# Patient Record
Sex: Female | Born: 1940
Health system: Southern US, Community
[De-identification: ages and names within clinical notes are randomized; demographics above are authoritative.]

## PROBLEM LIST (undated history)

## (undated) DIAGNOSIS — M479 Spondylosis, unspecified: Secondary | ICD-10-CM

## (undated) DIAGNOSIS — R310 Gross hematuria: Secondary | ICD-10-CM

## (undated) DIAGNOSIS — K5909 Other constipation: Secondary | ICD-10-CM

## (undated) DIAGNOSIS — E039 Hypothyroidism, unspecified: Secondary | ICD-10-CM

## (undated) DIAGNOSIS — M797 Fibromyalgia: Secondary | ICD-10-CM

## (undated) DIAGNOSIS — K5730 Diverticulosis of large intestine without perforation or abscess without bleeding: Secondary | ICD-10-CM

## (undated) DIAGNOSIS — I10 Essential (primary) hypertension: Secondary | ICD-10-CM

## (undated) DIAGNOSIS — Z860100 Personal history of colon polyps, unspecified: Secondary | ICD-10-CM

## (undated) DIAGNOSIS — K449 Diaphragmatic hernia without obstruction or gangrene: Secondary | ICD-10-CM

## (undated) DIAGNOSIS — F419 Anxiety disorder, unspecified: Secondary | ICD-10-CM

## (undated) DIAGNOSIS — K589 Irritable bowel syndrome without diarrhea: Secondary | ICD-10-CM

## (undated) DIAGNOSIS — M1990 Unspecified osteoarthritis, unspecified site: Secondary | ICD-10-CM

## (undated) DIAGNOSIS — D649 Anemia, unspecified: Secondary | ICD-10-CM

## (undated) DIAGNOSIS — J449 Chronic obstructive pulmonary disease, unspecified: Secondary | ICD-10-CM

## (undated) DIAGNOSIS — Z8709 Personal history of other diseases of the respiratory system: Secondary | ICD-10-CM

## (undated) DIAGNOSIS — N2889 Other specified disorders of kidney and ureter: Secondary | ICD-10-CM

## (undated) DIAGNOSIS — K219 Gastro-esophageal reflux disease without esophagitis: Secondary | ICD-10-CM

## (undated) DIAGNOSIS — E785 Hyperlipidemia, unspecified: Secondary | ICD-10-CM

## (undated) HISTORY — PX: TUBAL LIGATION: SHX77

## (undated) HISTORY — PX: OTHER SURGICAL HISTORY: SHX169

## (undated) HISTORY — PX: BREAST REDUCTION SURGERY: SHX8

## (undated) HISTORY — PX: REPLACEMENT TOTAL KNEE: SUR1224

## (undated) HISTORY — DX: Anxiety disorder, unspecified: F41.9

## (undated) HISTORY — PX: TOTAL SHOULDER REPLACEMENT: SUR1217

## (undated) HISTORY — DX: Anemia, unspecified: D64.9

## (undated) HISTORY — PX: CHOLECYSTECTOMY: SHX55

---

## 2015-12-16 ENCOUNTER — Encounter: Payer: Self-pay | Admitting: Pediatric Intensive Care

## 2015-12-19 ENCOUNTER — Encounter: Payer: Self-pay | Admitting: Pediatric Intensive Care

## 2015-12-23 ENCOUNTER — Emergency Department (HOSPITAL_COMMUNITY)
Admission: EM | Admit: 2015-12-23 | Discharge: 2015-12-24 | Disposition: A | Discharge status: Home / Self Care | Payer: Medicare Other | Attending: Emergency Medicine | Admitting: Emergency Medicine

## 2015-12-23 ENCOUNTER — Emergency Department (HOSPITAL_COMMUNITY): Payer: Medicare Other

## 2015-12-23 ENCOUNTER — Encounter (HOSPITAL_COMMUNITY): Payer: Self-pay | Admitting: *Deleted

## 2015-12-23 DIAGNOSIS — J181 Lobar pneumonia, unspecified organism: Secondary | ICD-10-CM | POA: Diagnosis not present

## 2015-12-23 DIAGNOSIS — R1012 Left upper quadrant pain: Secondary | ICD-10-CM | POA: Diagnosis present

## 2015-12-23 DIAGNOSIS — J189 Pneumonia, unspecified organism: Secondary | ICD-10-CM

## 2015-12-23 HISTORY — DX: Diaphragmatic hernia without obstruction or gangrene: K44.9

## 2015-12-23 LAB — COMPREHENSIVE METABOLIC PANEL
ALBUMIN: 3.9 g/dL (ref 3.5–5.0)
ALT: 12 U/L — ABNORMAL LOW (ref 14–54)
AST: 21 U/L (ref 15–41)
Alkaline Phosphatase: 94 U/L (ref 38–126)
Anion gap: 7 (ref 5–15)
BUN: 16 mg/dL (ref 6–20)
CHLORIDE: 102 mmol/L (ref 101–111)
CO2: 29 mmol/L (ref 22–32)
Calcium: 8.9 mg/dL (ref 8.9–10.3)
Creatinine, Ser: 0.78 mg/dL (ref 0.44–1.00)
GFR calc Af Amer: >60 mL/min (ref >60)
GLUCOSE: 137 mg/dL — AB (ref 65–99)
POTASSIUM: 3.6 mmol/L (ref 3.5–5.1)
SODIUM: 138 mmol/L (ref 135–145)
Total Bilirubin: 0.4 mg/dL (ref 0.3–1.2)
Total Protein: 6.9 g/dL (ref 6.5–8.1)

## 2015-12-23 LAB — CBC WITH DIFFERENTIAL/PLATELET
Basophils Absolute: 0 10*3/uL (ref 0.0–0.1)
Basophils Relative: 0 %
EOS PCT: 3 %
Eosinophils Absolute: 0.3 10*3/uL (ref 0.0–0.7)
HCT: 39.8 % (ref 36.0–46.0)
Hemoglobin: 12.9 g/dL (ref 12.0–15.0)
LYMPHS ABS: 2 10*3/uL (ref 0.7–4.0)
LYMPHS PCT: 20 %
MCH: 27.3 pg (ref 26.0–34.0)
MCHC: 32.4 g/dL (ref 30.0–36.0)
MCV: 84.1 fL (ref 78.0–100.0)
MONOS PCT: 6 %
Monocytes Absolute: 0.7 10*3/uL (ref 0.1–1.0)
Neutro Abs: 7.1 10*3/uL (ref 1.7–7.7)
Neutrophils Relative %: 71 %
PLATELETS: 266 10*3/uL (ref 150–400)
RBC: 4.73 MIL/uL (ref 3.87–5.11)
RDW: 15 % (ref 11.5–15.5)
WBC: 10.1 10*3/uL (ref 4.0–10.5)

## 2015-12-23 MED ORDER — IOPAMIDOL (ISOVUE-300) INJECTION 61%
100.0000 mL | Freq: Once | INTRAVENOUS | Status: AC | PRN
  Administered 2015-12-23: 100 mL via INTRAVENOUS

## 2015-12-23 NOTE — ED Triage Notes (Signed)
Pt complains of upper left epigastric pain radiating into chest after eating today. Pt has hx of hiatal hernia. Pt states she usually takes nexium for relief but did not have any tonight. Pt states she had nausea vomiting after eating, denies nausea at this time. BP 162/92.

## 2015-12-23 NOTE — ED Provider Notes (Signed)
[facility] DEPT Provider Note   CSN: [id number] Arrival date & time: 12/23/15  2029     History   Chief Complaint Chief Complaint  Patient presents with  . Abdominal Pain    HPI Isabel Collins is a 75 y.o. female.  Patient presents with pain in the left upper and lower quadrant abdomen that started tonight. She reports nausea and vomiting that has resolved. She also states she has not had a bowel movement in a week. No fever, melena, hematemesis. She felt that symptoms were due to her hiatal hernia and having been out of her Nexium for a while. No SOB or cough but she does report greater LUQ discomfort with breathing deeply. No urinary symptoms.    The history is provided by the patient. No language interpreter was used.  Abdominal Pain   Associated symptoms include nausea and vomiting. Pertinent negatives include fever, diarrhea, constipation, dysuria and myalgias.    Past Medical History:  Diagnosis Date  . Hiatal hernia     There are no active problems to display for this patient.   History reviewed. No pertinent surgical history.  OB History    No data available       Home Medications    Prior to Admission medications   Not on File    Family History No family history on file.  Social History Social History  Substance Use Topics  . Smoking status: Never Smoker  . Smokeless tobacco: Never Used  . Alcohol use No     Allergies   Review of patient's allergies indicates not on file.   Review of Systems Review of Systems  Constitutional: Negative for chills and fever.  Respiratory: Negative.  Negative for shortness of breath.   Cardiovascular: Negative.  Negative for chest pain.  Gastrointestinal: Positive for abdominal pain, nausea and vomiting. Negative for constipation and diarrhea.  Genitourinary: Negative.  Negative for dysuria and vaginal discharge.  Musculoskeletal: Negative.  Negative for back pain and myalgias.  Skin: Negative.     Neurological: Negative.      Physical Exam Updated Vital Signs BP 144/59 (BP Location: Right Arm)   Pulse 96   Temp 98.6 F (37 C) (Oral)   Resp 18   Ht 5' (1.524 m)   Wt 65.8 kg   SpO2 96%   BMI 28.32 kg/m   Physical Exam  Constitutional: She is oriented to person, place, and time. She appears well-developed and well-nourished.  HENT:  Head: Normocephalic.  Neck: Normal range of motion. Neck supple.  Cardiovascular: Normal rate and regular rhythm.   No murmur heard. Pulmonary/Chest: Effort normal and breath sounds normal. She has no wheezes. She has no rales. She exhibits no tenderness.  Abdominal: Soft. Bowel sounds are normal. There is tenderness (Left upper and lower abdominal tenderness. ). There is no rebound and no guarding.  Musculoskeletal: Normal range of motion.  Neurological: She is alert and oriented to person, place, and time.  Skin: Skin is warm and dry. No rash noted.  Psychiatric: She has a normal mood and affect.     ED Treatments / Results  Labs (all labs ordered are listed, but only abnormal results are displayed) Labs Reviewed  URINE CULTURE  COMPREHENSIVE METABOLIC PANEL  CBC WITH DIFFERENTIAL/PLATELET  URINALYSIS, ROUTINE W REFLEX MICROSCOPIC (NOT AT [facility])   Results for orders placed or performed during the hospital encounter of 12/23/15  Comprehensive metabolic panel  Result Value Ref Range   Sodium 138 135 - 145 mmol/L  Potassium 3.6 3.5 - 5.1 mmol/L   Chloride 102 101 - 111 mmol/L   CO2 29 22 - 32 mmol/L   Glucose, Bld 137 (H) 65 - 99 mg/dL   BUN 16 6 - 20 mg/dL   Creatinine, Ser 0.78 0.44 - 1.00 mg/dL   Calcium 8.9 8.9 - 10.3 mg/dL   Total Protein 6.9 6.5 - 8.1 g/dL   Albumin 3.9 3.5 - 5.0 g/dL   AST 21 15 - 41 U/L   ALT 12 (L) 14 - 54 U/L   Alkaline Phosphatase 94 38 - 126 U/L   Total Bilirubin 0.4 0.3 - 1.2 mg/dL   GFR calc non Af Amer >60 >60 mL/min   GFR calc Af Amer >60 >60 mL/min   Anion gap 7 5 - 15  CBC with  Differential  Result Value Ref Range   WBC 10.1 4.0 - 10.5 K/uL   RBC 4.73 3.87 - 5.11 MIL/uL   Hemoglobin 12.9 12.0 - 15.0 g/dL   HCT 39.8 36.0 - 46.0 %   MCV 84.1 78.0 - 100.0 fL   MCH 27.3 26.0 - 34.0 pg   MCHC 32.4 30.0 - 36.0 g/dL   RDW 15.0 11.5 - 15.5 %   Platelets 266 150 - 400 K/uL   Neutrophils Relative % 71 %   Neutro Abs 7.1 1.7 - 7.7 K/uL   Lymphocytes Relative 20 %   Lymphs Abs 2.0 0.7 - 4.0 K/uL   Monocytes Relative 6 %   Monocytes Absolute 0.7 0.1 - 1.0 K/uL   Eosinophils Relative 3 %   Eosinophils Absolute 0.3 0.0 - 0.7 K/uL   Basophils Relative 0 %   Basophils Absolute 0.0 0.0 - 0.1 K/uL  Urinalysis, Routine w reflex microscopic  Result Value Ref Range   Color, Urine YELLOW YELLOW   APPearance CLEAR CLEAR   Specific Gravity, Urine 1.019 1.005 - 1.030   pH 7.5 5.0 - 8.0   Glucose, UA NEGATIVE NEGATIVE mg/dL   Hgb urine dipstick NEGATIVE NEGATIVE   Bilirubin Urine NEGATIVE NEGATIVE   Ketones, ur NEGATIVE NEGATIVE mg/dL   Protein, ur NEGATIVE NEGATIVE mg/dL   Nitrite NEGATIVE NEGATIVE   Leukocytes, UA NEGATIVE NEGATIVE    EKG  EKG Interpretation None       Radiology No results found. Ct Abdomen Pelvis W Contrast  Result Date: 12/24/2015 CLINICAL DATA:  Acute onset of left upper quadrant abdominal pain, radiating into the chest. Nausea and vomiting. Initial encounter. EXAM: CT ABDOMEN AND PELVIS WITH CONTRAST TECHNIQUE: Multidetector CT imaging of the abdomen and pelvis was performed using the standard protocol following bolus administration of intravenous contrast. CONTRAST:  175mL ISOVUE-300 IOPAMIDOL (ISOVUE-300) INJECTION 61% COMPARISON:  None. FINDINGS: Lower chest: Minimal left basilar opacity could reflect mild infection. A moderate to large hiatal hernia is noted. Hepatobiliary: The liver is unremarkable in appearance. The patient is status post cholecystectomy, with clips noted at the gallbladder fossa. The common bile duct remains normal in  caliber. Pancreas: The pancreas is within normal limits. Spleen: The spleen is unremarkable in appearance. Adrenals/Urinary Tract: The adrenal glands are unremarkable in appearance. The kidneys are within normal limits. There is no evidence of hydronephrosis. No renal or ureteral stones are identified. No perinephric stranding is seen. Stomach/Bowel: The stomach is unremarkable in appearance. The small bowel is within normal limits. The appendix is not visualized; there is no evidence for appendicitis. Scattered diverticulosis is noted along the descending and proximal sigmoid colon, without evidence of diverticulitis. Vascular/Lymphatic: Scattered  calcification is seen along the abdominal aorta and its branches. The abdominal aorta is otherwise grossly unremarkable. The inferior vena cava is grossly unremarkable. No retroperitoneal lymphadenopathy is seen. No pelvic sidewall lymphadenopathy is identified. Reproductive: The bladder is mildly distended and grossly unremarkable. The patient is status post hysterectomy. No suspicious adnexal masses are seen. Other: No additional soft tissue abnormalities are seen. Musculoskeletal: No acute osseous abnormalities are identified. Multilevel vacuum phenomenon is noted along the lower thoracic and lumbar spine. Underlying facet disease is noted. The visualized musculature is unremarkable in appearance. IMPRESSION: 1. Minimal left basilar airspace opacity could reflect mild infection. 2. Moderate to large hiatal hernia noted. 3. Scattered diverticulosis along the descending and proximal sigmoid colon, without evidence of diverticulitis. 4. Scattered aortic atherosclerosis. 5. Mild degenerative change along the lower thoracic and lumbar spine. Electronically Signed   By: Garald Balding M.D.   On: 12/24/2015 00:22    Procedures Procedures (including critical care time)  Medications Ordered in ED Medications - No data to display   Initial Impression / Assessment and  Plan / ED Course  I have reviewed the triage vital signs and the nursing notes.  Pertinent labs & imaging results that were available during my care of the patient were reviewed by me and considered in my medical decision making (see chart for details).  Clinical Course    Patient with left sided abdominal pain, no fever, vomiting. She denies SOB or cough, but reports LUQ discomfort is worse with breathing. CT of abdomen reveals left basilar opacity, ?mild infection. Given discomfort with breathing, will start on abx.   VSS. She remains afebrile. There is no ride back to the shelter tonight - patient will board in ED until buses run in am.   Final Clinical Impressions(s) / ED Diagnoses   Final diagnoses:  None   1. LLL PNA  New Prescriptions New Prescriptions   No medications on file     Charlann Lange, PA-C 12/24/15 0038    Leo Grosser, MD 12/24/15 [id number]

## 2015-12-23 NOTE — ED Notes (Signed)
Bed: WA21 Expected date:  Expected time:  Means of arrival:  Comments: EMS/abd pain after eating dinner/+hiatal hernia

## 2015-12-23 NOTE — Progress Notes (Signed)
Patient listed as having Medicare insurance without a pcp.  EDCM went to speak to patient at bedside.  Patient reports she has a pcp but not in [place].  Patient is from [facility].  Patient having IV placed at time of [facility] visit.

## 2015-12-23 NOTE — ED Notes (Signed)
IV ATTEMPT X2 UNSUCCESSFUL.

## 2015-12-24 DIAGNOSIS — J181 Lobar pneumonia, unspecified organism: Secondary | ICD-10-CM | POA: Diagnosis not present

## 2015-12-24 LAB — URINALYSIS, ROUTINE W REFLEX MICROSCOPIC
Bilirubin Urine: NEGATIVE
GLUCOSE, UA: NEGATIVE mg/dL
HGB URINE DIPSTICK: NEGATIVE
Ketones, ur: NEGATIVE mg/dL
Leukocytes, UA: NEGATIVE
Nitrite: NEGATIVE
PROTEIN: NEGATIVE mg/dL
SPECIFIC GRAVITY, URINE: 1.019 (ref 1.005–1.030)
pH: 7.5 (ref 5.0–8.0)

## 2015-12-24 MED ORDER — AZITHROMYCIN 250 MG PO TABS: 250.0000 mg | ORAL_TABLET | Freq: Every day | ORAL | 0 refills | Status: DC

## 2015-12-24 MED ORDER — AZITHROMYCIN 250 MG PO TABS
500.0000 mg | ORAL_TABLET | Freq: Once | ORAL | Status: AC
Start: 2015-12-24 — End: 2015-12-24
  Administered 2015-12-24: 500 mg via ORAL
  Filled 2015-12-24: qty 2

## 2015-12-25 LAB — URINE CULTURE: Culture: <10000 — AB

## 2015-12-26 MED FILL — AZITHROMYCIN 250 MG TABLET: 250 | 4 days supply | Qty: 4 | Fill #0

## 2016-01-03 ENCOUNTER — Encounter: Payer: Self-pay | Admitting: Pediatric Intensive Care

## 2016-01-06 ENCOUNTER — Encounter: Payer: Self-pay | Admitting: Pediatric Intensive Care

## 2016-01-09 NOTE — Congregational Nurse Program (Signed)
Congregational Nurse Program Note  Date of Encounter: 12/26/2015  Past Medical History: Past Medical History:  Diagnosis Date  . Hiatal hernia     Encounter Details:     CNP Questionnaire - 01/09/16 0854      Patient Demographics   Is this a new or existing patient? New   Patient is considered a/an Not Applicable   Race Caucasian/White     Patient Assistance   Location of Patient Assistance Not Applicable   Patient's financial/insurance status Low Income;Medicare   Uninsured Patient ([facility]) No   Patient referred to apply for the following financial assistance Not Applicable   Food insecurities addressed Not Applicable   Transportation assistance No   Assistance securing medications Yes   Type of Assistance [facility]   Educational health offerings Medications     Encounter Details   Primary purpose of visit Acute Illness/Condition Visit   Was an Emergency Department visit averted? Not Applicable   Does patient have a medical provider? No   Patient referred to Not Applicable   Was a mental health screening completed? (GAINS tool) No   Does patient have dental issues? No   Does patient have vision issues? No   Does your patient have an abnormal blood pressure today? No   Since previous encounter, have you referred patient for abnormal blood pressure that resulted in a new diagnosis or medication change? No   Does your patient have an abnormal blood glucose today? No   Since previous encounter, have you referred patient for abnormal blood glucose that resulted in a new diagnosis or medication change? No   Was there a life-saving intervention made? No     Client was seen in [facility] ED dx with pneumonia.  Was hospitalized at [facility].  Is currently homeless, living at the [facility].  Is being seen by congregational nurse Talbert Cage.  Client in need of script being filled after [facility] visit.  Script for Zithromax filled and  delivered to client

## 2016-01-11 NOTE — Congregational Nurse Program (Signed)
Congregational Nurse Program Note  Date of Encounter: 01/03/2016  Past Medical History: Past Medical History:  Diagnosis Date  . Hiatal hernia     Encounter Details:     CNP Questionnaire - 01/09/16 0854      Patient Demographics   Is this a new or existing patient? New   Patient is considered a/an Not Applicable   Race Caucasian/White     Patient Assistance   Location of Patient Assistance Not Applicable   Patient's financial/insurance status Low Income;Medicare   Uninsured Patient ([facility]) No   Patient referred to apply for the following financial assistance Not Applicable   Food insecurities addressed Not Applicable   Transportation assistance No   Assistance securing medications Yes   Type of Assistance [facility]   Educational health offerings Medications     Encounter Details   Primary purpose of visit Acute Illness/Condition Visit   Was an Emergency Department visit averted? Not Applicable   Does patient have a medical provider? No   Patient referred to Not Applicable   Was a mental health screening completed? (GAINS tool) No   Does patient have dental issues? No   Does patient have vision issues? No   Does your patient have an abnormal blood pressure today? No   Since previous encounter, have you referred patient for abnormal blood pressure that resulted in a new diagnosis or medication change? No   Does your patient have an abnormal blood glucose today? No   Since previous encounter, have you referred patient for abnormal blood glucose that resulted in a new diagnosis or medication change? No   Was there a life-saving intervention made? No     Client states that she had gone to the ED due to breathing issues. Assisted by C. Evans CN to get prescribed antibiotic. Client states that she is feeling better. BBS clear. Client has intermittent non-productive cough. Client will follow up with CN as needed.

## 2016-01-11 NOTE — Congregational Nurse Program (Signed)
Congregational Nurse Program Note  Date of Encounter: 12/16/2015  Past Medical History: Past Medical History:  Diagnosis Date  . Hiatal hernia     Encounter Details:     CNP Questionnaire - 01/09/16 0854      Patient Demographics   Is this a new or existing patient? New   Patient is considered a/an Not Applicable   Race Caucasian/White     Patient Assistance   Location of Patient Assistance Not Applicable   Patient's financial/insurance status Low Income;Medicare   Uninsured Patient ([facility]) No   Patient referred to apply for the following financial assistance Not Applicable   Food insecurities addressed Not Applicable   Transportation assistance No   Assistance securing medications Yes   Type of Assistance [facility]   Educational health offerings Medications     Encounter Details   Primary purpose of visit Acute Illness/Condition Visit   Was an Emergency Department visit averted? Not Applicable   Does patient have a medical provider? No   Patient referred to Not Applicable   Was a mental health screening completed? (GAINS tool) No   Does patient have dental issues? No   Does patient have vision issues? No   Does your patient have an abnormal blood pressure today? No   Since previous encounter, have you referred patient for abnormal blood pressure that resulted in a new diagnosis or medication change? No   Does your patient have an abnormal blood glucose today? No   Since previous encounter, have you referred patient for abnormal blood glucose that resulted in a new diagnosis or medication change? No   Was there a life-saving intervention made? No     New Cn client visit. Client is new to shelter. States history of "high blood pressure, irregular heart beat, arthritis, fibromyalgia". Client states that she had been in the hospital in [facility] due to "pneumonia". Client is on multiple medications. CN will assist client with medications as  needed and will assist with connecting to PCP- probably [facility] at [facility]. Client to follow up in clinic tomorrow for medication list and BP re-check.

## 2016-01-11 NOTE — Congregational Nurse Program (Signed)
Congregational Nurse Program Note  Date of Encounter: 12/19/2015  Past Medical History: Past Medical History:  Diagnosis Date  . Hiatal hernia     Encounter Details:     CNP Questionnaire - 01/09/16 0854      Patient Demographics   Is this a new or existing patient? New   Patient is considered a/an Not Applicable   Race Caucasian/White     Patient Assistance   Location of Patient Assistance Not Applicable   Patient's financial/insurance status Low Income;Medicare   Uninsured Patient ([facility]) No   Patient referred to apply for the following financial assistance Not Applicable   Food insecurities addressed Not Applicable   Transportation assistance No   Assistance securing medications Yes   Type of Assistance [facility]   Educational health offerings Medications     Encounter Details   Primary purpose of visit Acute Illness/Condition Visit   Was an Emergency Department visit averted? Not Applicable   Does patient have a medical provider? No   Patient referred to Not Applicable   Was a mental health screening completed? (GAINS tool) No   Does patient have dental issues? No   Does patient have vision issues? No   Does your patient have an abnormal blood pressure today? No   Since previous encounter, have you referred patient for abnormal blood pressure that resulted in a new diagnosis or medication change? No   Does your patient have an abnormal blood glucose today? No   Since previous encounter, have you referred patient for abnormal blood glucose that resulted in a new diagnosis or medication change? No   Was there a life-saving intervention made? No     CN reviewed medications with client. Client has adequate amount of medication for now but will need to establish local PCP and transfer insurance. Client states that she has a PCP in [place] who she visited in [place] will go to walk in clinic at [facility] on 10/17.

## 2016-01-12 NOTE — Congregational Nurse Program (Signed)
Congregational Nurse Program Note  Date of Encounter: 01/03/2016  Past Medical History: Past Medical History:  Diagnosis Date  . Hiatal hernia     Encounter Details:     CNP Questionnaire - 01/09/16 0854      Patient Demographics   Is this a new or existing patient? New   Patient is considered a/an Not Applicable   Race Caucasian/White     Patient Assistance   Location of Patient Assistance Not Applicable   Patient's financial/insurance status Low Income;Medicare   Uninsured Patient ([facility]) No   Patient referred to apply for the following financial assistance Not Applicable   Food insecurities addressed Not Applicable   Transportation assistance No   Assistance securing medications Yes   Type of Assistance [facility]   Educational health offerings Medications     Encounter Details   Primary purpose of visit Acute Illness/Condition Visit   Was an Emergency Department visit averted? Not Applicable   Does patient have a medical provider? No   Patient referred to Not Applicable   Was a mental health screening completed? (GAINS tool) No   Does patient have dental issues? No   Does patient have vision issues? No   Does your patient have an abnormal blood pressure today? No   Since previous encounter, have you referred patient for abnormal blood pressure that resulted in a new diagnosis or medication change? No   Does your patient have an abnormal blood glucose today? No   Since previous encounter, have you referred patient for abnormal blood glucose that resulted in a new diagnosis or medication change? No   Was there a life-saving intervention made? No     Client in for follow up post-ED visit. She is nearly out of synthroid, omeprazole and lyrica. CN contacted physician of record in [facility] and they called in prescriptions to [facility]. Unable to fill lyrica due to cost as client's medicaid rx is not completed. Will still need a pre-authorization  for lyrica after medicaid is completed. Client encouraged to take appointment at [facility] clinic as she needs to be seen for follow up, but she refuses to ride bus. CN will revisit this issue with [facility].

## 2016-01-13 ENCOUNTER — Encounter: Payer: Self-pay | Admitting: Pediatric Intensive Care

## 2016-01-16 ENCOUNTER — Encounter: Payer: Self-pay | Admitting: Pediatric Intensive Care

## 2016-01-16 NOTE — Congregational Nurse Program (Signed)
Congregational Nurse Program Note  Date of Encounter: 01/13/2016  Past Medical History: Past Medical History:  Diagnosis Date  . Hiatal hernia     Encounter Details:     CNP Questionnaire - 01/13/16 1100      Patient Demographics   Is this a new or existing patient? Existing   Patient is considered a/an Not Applicable   Race Caucasian/White     Patient Assistance   Location of Patient Assistance [facility]   Patient's financial/insurance status Medicare   Uninsured Patient ([facility]) No   Patient referred to apply for the following financial assistance Not Applicable   Food insecurities addressed Not Applicable   Transportation assistance No   Assistance securing medications Yes   Type of Assistance Other   Educational health offerings Medications     Encounter Details   Primary purpose of visit [place]   Was an Emergency Department visit averted? Not Applicable   Does patient have a medical provider? No   Patient referred to Not Applicable   Was a mental health screening completed? (GAINS tool) No   Does patient have dental issues? No   Does patient have vision issues? No   Does your patient have an abnormal blood pressure today? No   Since previous encounter, have you referred patient for abnormal blood pressure that resulted in a new diagnosis or medication change? No   Does your patient have an abnormal blood glucose today? No   Since previous encounter, have you referred patient for abnormal blood glucose that resulted in a new diagnosis or medication change? No   Was there a life-saving intervention made? No     Client is for assistance with obtaining medications. Client states that she had appointment at [facility] and that the provider said she would [facility] on [address] [facility]- no medications were on record. Pharmacist called client's provider in [facility] and prescriptions for  Lyrica and Cymbalta will be transferred to [facility]. Pharmacists will notify CN when this has occurred. CN will notify client.

## 2016-01-16 NOTE — Congregational Nurse Program (Signed)
Congregational Nurse Program Note  Date of Encounter: 01/16/2016  Past Medical History: Past Medical History:  Diagnosis Date  . Hiatal hernia     Encounter Details:     CNP Questionnaire - 01/16/16 0930      Patient Demographics   Is this a new or existing patient? Existing   Patient is considered a/an Not Applicable   Race Caucasian/White     Patient Assistance   Location of Patient Assistance [facility]   Patient's financial/insurance status Medicare   Uninsured Patient ([facility]) No   Patient referred to apply for the following financial assistance Not Applicable   Food insecurities addressed Not Applicable   Transportation assistance No   Assistance securing medications No   Type of Assistance Other   Educational health offerings Medications     Encounter Details   Primary purpose of visit Other;Safety   Was an Emergency Department visit averted? Not Applicable   Does patient have a medical provider? No   Patient referred to Not Applicable   Was a mental health screening completed? (GAINS tool) No   Does patient have dental issues? No   Does patient have vision issues? No   Does your patient have an abnormal blood pressure today? No   Since previous encounter, have you referred patient for abnormal blood pressure that resulted in a new diagnosis or medication change? No   Does your patient have an abnormal blood glucose today? No   Since previous encounter, have you referred patient for abnormal blood glucose that resulted in a new diagnosis or medication change? No   Was there a life-saving intervention made? No      CN remindedr client that her [facility]. Client had marked bottle of Trazadone as Tylenol. Reviewed medication safety and labelling with client.
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
## 2016-01-24 ENCOUNTER — Encounter: Payer: Self-pay | Admitting: Pediatric Intensive Care

## 2016-01-31 ENCOUNTER — Ambulatory Visit (HOSPITAL_COMMUNITY)
Admission: EM | Admit: 2016-01-31 | Discharge: 2016-01-31 | Disposition: A | Discharge status: Home / Self Care | Payer: Medicare Other | Attending: Family Medicine | Admitting: Family Medicine

## 2016-01-31 ENCOUNTER — Encounter (HOSPITAL_COMMUNITY): Payer: Self-pay | Admitting: Emergency Medicine

## 2016-01-31 DIAGNOSIS — K5904 Chronic idiopathic constipation: Secondary | ICD-10-CM

## 2016-01-31 HISTORY — DX: Essential (primary) hypertension: I10

## 2016-01-31 MED ORDER — METHYLCELLULOSE (LAXATIVE) PO POWD: 1.0000 | Freq: Every day | ORAL | 2 refills | Status: DC

## 2016-01-31 MED ORDER — POLYETHYLENE GLYCOL 3350 17 GM/SCOOP PO POWD: 17.0000 g | Freq: Every day | ORAL | 5 refills | Status: DC

## 2016-01-31 NOTE — ED Provider Notes (Signed)
[facility]    CSN: [id number] Arrival date & time: 01/31/16  1207     History   Chief Complaint Chief Complaint  Patient presents with  . Constipation    HPI Isabel Collins is a 75 y.o. female.   The history is provided by the patient.  Constipation  Severity:  Moderate Time since last bowel movement:  1 day Progression:  Unchanged Chronicity:  Chronic (lifelong problem, has been on dulcolax which causes a blow out. no blood.) Context: dehydration, dietary changes and stress   Stool description:  Loose Relieved by:  None tried Worsened by:  Nothing Associated symptoms: no abdominal pain, no fever, no nausea and no vomiting     Past Medical History:  Diagnosis Date  . Hiatal hernia   . High cholesterol   . Hypertension   . Thyroid disease     There are no active problems to display for this patient.   Past Surgical History:  Procedure Laterality Date  . arm surgery    . BREAST SURGERY    . REPLACEMENT TOTAL KNEE    . TOTAL SHOULDER REPLACEMENT      OB History    No data available       Home Medications    Prior to Admission medications   Medication Sig Start Date End Date Taking? Authorizing Provider  esomeprazole (NEXIUM) 40 MG capsule Take 40 mg by mouth daily at 12 noon.   Yes Historical Provider, MD  atorvastatin (LIPITOR) 40 MG tablet Take 40 mg by mouth at bedtime.    Historical Provider, MD  azithromycin (ZITHROMAX Z-PAK) 250 MG tablet Take 1 tablet (250 mg total) by mouth daily. 12/24/15   Charlann Lange, PA-C  celecoxib (CELEBREX) 200 MG capsule Take 200 mg by mouth 2 (two) times daily.    Historical Provider, MD  dicyclomine (BENTYL) 10 MG capsule Take 20 mg by mouth 3 (three) times daily as needed for spasms.    Historical Provider, MD  DULoxetine (CYMBALTA) 60 MG capsule Take 60 mg by mouth every morning.    Historical Provider, MD  guaifenesin (HUMIBID E) 400 MG TABS tablet Take 400 mg by mouth every 4 (four) hours as needed  (mucus relief).    Historical Provider, MD  levothyroxine (SYNTHROID, LEVOTHROID) 50 MCG tablet Take 50 mcg by mouth daily before breakfast.    Historical Provider, MD  metoprolol succinate (TOPROL-XL) 100 MG 24 hr tablet Take 100 mg by mouth every morning. Take with or immediately following a meal.    Historical Provider, MD  omeprazole (PRILOSEC) 40 MG capsule Take 40 mg by mouth every morning.    Historical Provider, MD  pregabalin (LYRICA) 50 MG capsule Take 50 mg by mouth 2 (two) times daily.    Historical Provider, MD  traZODone (DESYREL) 50 MG tablet Take 50 mg by mouth at bedtime as needed for sleep.    Historical Provider, MD    Family History No family history on file.  Social History Social History  Substance Use Topics  . Smoking status: Former Research scientist (life sciences)  . Smokeless tobacco: Never Used  . Alcohol use No     Allergies   Patient has no known allergies.   Review of Systems Review of Systems  Constitutional: Negative for fever.  Gastrointestinal: Positive for constipation. Negative for abdominal pain, nausea and vomiting.     Physical Exam Triage Vital Signs ED Triage Vitals [01/31/16 1312]  Enc Vitals Group     BP 157/70  Pulse Rate 79     Resp 18     Temp 97.6 F (36.4 C)     Temp Source Oral     SpO2 96 %     Weight      Height      Head Circumference      Peak Flow      Pain Score      Pain Loc      Pain Edu?      Excl. in [place]?    No data found.   Updated Vital Signs BP 157/70 (BP Location: Left Arm)   Pulse 79   Temp 97.6 F (36.4 C) (Oral)   Resp 18   SpO2 96%   Visual Acuity Right Eye Distance:   Left Eye Distance:   Bilateral Distance:    Right Eye Near:   Left Eye Near:    Bilateral Near:     Physical Exam  Constitutional: She appears well-developed and well-nourished. No distress.  Abdominal: Soft. Bowel sounds are normal. She exhibits no mass. There is no tenderness. There is no rebound. No hernia.  Genitourinary: Rectal  exam shows guaiac negative stool.  Genitourinary Comments: Rectal exam liquid stool no impaction, guiac neg.  Nursing note and vitals reviewed.    UC Treatments / Results  Labs (all labs ordered are listed, but only abnormal results are displayed) Labs Reviewed - No data to display  EKG  EKG Interpretation None       Radiology No results found.  Procedures Procedures (including critical care time)  Medications Ordered in UC Medications - No data to display   Initial Impression / Assessment and Plan / UC Course  I have reviewed the triage vital signs and the nursing notes.  Pertinent labs & imaging results that were available during my care of the patient were reviewed by me and considered in my medical decision making (see chart for details).  Clinical Course       Final Clinical Impressions(s) / UC Diagnoses   Final diagnoses:  None    New Prescriptions New Prescriptions   No medications on file     Billy Fischer, MD 01/31/16 1416

## 2016-01-31 NOTE — Discharge Instructions (Signed)
See your doctor if further problems. °

## 2016-01-31 NOTE — ED Triage Notes (Signed)
Patient had a normal bm today, this morning.  Patient reports having no bowel movement for 3 weeks.  Patient reports not taking any laxatives today.  Patient has a long history of bowel irregularity.  Reports a normal bm, but clear liquid noted on underwear-thinks this is from rectum.

## 2016-02-03 ENCOUNTER — Encounter: Payer: Self-pay | Admitting: Pediatric Intensive Care

## 2016-02-06 ENCOUNTER — Encounter: Payer: Self-pay | Admitting: Pediatric Intensive Care

## 2016-02-07 ENCOUNTER — Encounter: Payer: Self-pay | Admitting: Pediatric Intensive Care

## 2016-02-08 NOTE — Congregational Nurse Program (Signed)
Congregational Nurse Program Note  Date of Encounter: 01/06/2016  Past Medical History: Past Medical History:  Diagnosis Date  . Hiatal hernia   . High cholesterol   . Hypertension   . Thyroid disease     Encounter Details:     CNP Questionnaire - 01/16/16 1224      Patient Demographics   Is this a new or existing patient? Existing   Patient is considered a/an Not Applicable   Race Caucasian/White     Patient Assistance   Location of Patient Assistance [facility]   Patient's financial/insurance status Medicare   Uninsured Patient ([facility]/Care Connects) No   Patient referred to apply for the following financial assistance Not Applicable   Food insecurities addressed Not Applicable   Transportation assistance No   Assistance securing medications No   Educational Naval architect health;Other     Encounter Details   Primary purpose of visit Other;Safety   Was an Emergency Department visit averted? Not Applicable   Does patient have a medical provider? No   Patient referred to Not Applicable   Was a mental health screening completed? (GAINS tool) No   Does patient have dental issues? No   Does patient have vision issues? No   Does your patient have an abnormal blood pressure today? No   Since previous encounter, have you referred patient for abnormal blood pressure that resulted in a new diagnosis or medication change? No   Does your patient have an abnormal blood glucose today? No   Since previous encounter, have you referred patient for abnormal blood glucose that resulted in a new diagnosis or medication change? No   Was there a life-saving intervention made? No    Client in for BP check. She has not taken her medications yet today. CN verified that cleitn has an adequate supply of medication. CN revisted need for PCP in area but client states that she won't take bus for transportation. Client will return to CN clinic as needed.

## 2016-02-09 NOTE — Congregational Nurse Program (Signed)
Congregational Nurse Program Note  Date of Encounter: 01/24/2016  Past Medical History: Past Medical History:  Diagnosis Date  . Hiatal hernia   . High cholesterol   . Hypertension   . Thyroid disease     Encounter Details:     CNP Questionnaire - 01/24/16 1000      Patient Demographics   Is this a new or existing patient? Existing   Patient is considered a/an Not Applicable   Race Caucasian/White     Patient Assistance   Location of Patient Assistance [facility]   Patient's financial/insurance status Medicare;Medicaid   Patient referred to apply for the following financial assistance Not Applicable   Food insecurities addressed Not Applicable   Transportation assistance No   Assistance securing medications Yes   Type of Assistance Other   Educational health offerings Chronic disease     Encounter Details   Primary purpose of visit Chronic Illness/Condition Visit;Spiritual Care/Support Visit   Was an Emergency Department visit averted? Not Applicable   Does patient have a medical provider? No   Was a mental health screening completed? (GAINS tool) No   Does patient have dental issues? No   Does patient have vision issues? No   Does your patient have an abnormal blood pressure today? No   Since previous encounter, have you referred patient for abnormal blood pressure that resulted in a new diagnosis or medication change? No   Does your patient have an abnormal blood glucose today? No   Since previous encounter, have you referred patient for abnormal blood glucose that resulted in a new diagnosis or medication change? No   Was there a life-saving intervention made? No     BP check. Client states that she is ready to find a PCP in the area. Coordinating with Joy, Tourist information centre manager, as client will not ride the bus. Prescriptions transferred to [facility] on [address] for easier client access. Client will pick up refills today via taxi.

## 2016-02-10 ENCOUNTER — Ambulatory Visit (INDEPENDENT_AMBULATORY_CARE_PROVIDER_SITE_OTHER): Payer: Medicare Other

## 2016-02-10 ENCOUNTER — Ambulatory Visit (INDEPENDENT_AMBULATORY_CARE_PROVIDER_SITE_OTHER): Payer: Medicare Other | Admitting: Family Medicine

## 2016-02-10 VITALS — BP 134/72 | HR 73 | Temp 97.7°F | Resp 16 | Ht 61.0 in | Wt 152.0 lb

## 2016-02-10 DIAGNOSIS — R059 Cough, unspecified: Secondary | ICD-10-CM

## 2016-02-10 DIAGNOSIS — R05 Cough: Secondary | ICD-10-CM

## 2016-02-10 DIAGNOSIS — E559 Vitamin D deficiency, unspecified: Secondary | ICD-10-CM | POA: Diagnosis not present

## 2016-02-10 DIAGNOSIS — K449 Diaphragmatic hernia without obstruction or gangrene: Secondary | ICD-10-CM | POA: Diagnosis not present

## 2016-02-10 DIAGNOSIS — E7800 Pure hypercholesterolemia, unspecified: Secondary | ICD-10-CM

## 2016-02-10 DIAGNOSIS — E039 Hypothyroidism, unspecified: Secondary | ICD-10-CM

## 2016-02-10 DIAGNOSIS — I10 Essential (primary) hypertension: Secondary | ICD-10-CM

## 2016-02-10 LAB — POCT CBC
Granulocyte percent: 59.5 %G (ref 37–80)
HEMATOCRIT: 40.4 % (ref 37.7–47.9)
HEMOGLOBIN: 13.9 g/dL (ref 12.2–16.2)
LYMPH, POC: 2.7 (ref 0.6–3.4)
MCH, POC: 27.6 pg (ref 27–31.2)
MCHC: 34.4 g/dL (ref 31.8–35.4)
MCV: 80.2 fL (ref 80–97)
MID (CBC): 0.6 (ref 0–0.9)
MPV: 7.6 fL (ref 0–99.8)
POC GRANULOCYTE: 4.8 (ref 2–6.9)
POC LYMPH PERCENT: 32.8 %L (ref 10–50)
POC MID %: 7.7 % (ref 0–12)
Platelet Count, POC: 259 10*3/uL (ref 142–424)
RBC: 5.03 M/uL (ref 4.04–5.48)
RDW, POC: 14.5 %
WBC: 8.1 10*3/uL (ref 4.6–10.2)

## 2016-02-10 MED ORDER — THERA VITAL M PO TABS: 1.0000 | ORAL_TABLET | Freq: Every day | ORAL | 3 refills | Status: DC

## 2016-02-10 MED ORDER — PROMETHAZINE-PHENYLEPHRINE 6.25-5 MG/5ML PO SYRP: 5.0000 mL | ORAL_SOLUTION | ORAL | 0 refills | Status: DC | PRN

## 2016-02-10 MED ORDER — FLUTICASONE PROPIONATE 50 MCG/ACT NA SUSP: 2.0000 | Freq: Every day | NASAL | 1 refills | Status: DC

## 2016-02-10 MED ORDER — ERGOCALCIFEROL 1.25 MG (50000 UT) PO CAPS: 50000.0000 [IU] | ORAL_CAPSULE | ORAL | 0 refills | Status: DC

## 2016-02-10 NOTE — Progress Notes (Addendum)
Subjective:  By signing my name below, I, Essence Howell, attest that this documentation has been prepared under the direction and in the presence of [name], MD Electronically Signed: Ladene Artist, ED Scribe 02/10/2016 at 4:19 PM.   Patient ID: Isabel Collins, female    DOB: [birth year], 75 y.o.   MRN: [id number] Chief Complaint  Patient presents with  . Cough    especially at night  . Sore Throat   HPI HPI Comments: Isabel Collins is a 75 y.o. female, with a h/o fibromyalgia, IBS, constipation and hiatal hernia, who presents to the [facility] complaining of URI symptoms. [facility] at the homeless shelter but the congregational nurse at the [facility] convinced her to come in today. Pt has a PCP in [place] who prescribes her medication but pt does not get to visit her PCP often. She has not seen her PCP in a while. Pt gets frequent respiratory infections and has had multiple hospitalizations for pneumonia but no underlying respiratory disease. She has psychiatric disease with unknown diagnosis and unwillingness to treat.   Pt reports ongoing cough for the past month that is exacerbated at night. She states that cough recently became productive with yellow colored phlegm. Pt reports associated sore throat that is exacerbated with swallowing and fatigue that she describes as feeling "sluggish". She denies fever. Pt was hospitalized for similar symptoms on 10/20 for 1 week, diagnosed with pneumonia and treated with azithromycin.  Past Medical History:  Diagnosis Date  . Hiatal hernia   . High cholesterol   . Hypertension   . Thyroid disease    Current Outpatient Prescriptions on File Prior to Visit  Medication Sig Dispense Refill  . atorvastatin (LIPITOR) 40 MG tablet Take 40 mg by mouth at bedtime.    Marland Kitchen azithromycin (ZITHROMAX Z-PAK) 250 MG tablet Take 1 tablet (250 mg total) by mouth daily. 4 tablet 0  . celecoxib (CELEBREX) 200 MG capsule Take 200 mg by mouth 2  (two) times daily.    Marland Kitchen dicyclomine (BENTYL) 10 MG capsule Take 20 mg by mouth 3 (three) times daily as needed for spasms.    . DULoxetine (CYMBALTA) 60 MG capsule Take 60 mg by mouth every morning.    Marland Kitchen esomeprazole (NEXIUM) 40 MG capsule Take 40 mg by mouth daily at 12 noon.    Marland Kitchen guaifenesin (HUMIBID E) 400 MG TABS tablet Take 400 mg by mouth every 4 (four) hours as needed (mucus relief).    Marland Kitchen levothyroxine (SYNTHROID, LEVOTHROID) 50 MCG tablet Take 50 mcg by mouth daily before breakfast.    . methylcellulose (CITRUCEL) oral powder Take 1 packet by mouth daily. 1418 tablet 2  . metoprolol succinate (TOPROL-XL) 100 MG 24 hr tablet Take 100 mg by mouth every morning. Take with or immediately following a meal.    . polyethylene glycol powder (MIRALAX) powder Take 17 g by mouth daily. 225 g 5  . pregabalin (LYRICA) 50 MG capsule Take 50 mg by mouth 2 (two) times daily.    . traZODone (DESYREL) 50 MG tablet Take 50 mg by mouth at bedtime as needed for sleep.    Marland Kitchen omeprazole (PRILOSEC) 40 MG capsule Take 40 mg by mouth every morning.     No current facility-administered medications on file prior to visit.    No Known Allergies  Depression screen PHQ 2/9 02/10/2016  Decreased Interest 0  Down, Depressed, Hopeless 0  PHQ - 2 Score 0    Review of  Systems  Constitutional: Positive for fatigue. Negative for activity change, appetite change, chills, diaphoresis and fever.  HENT: Positive for postnasal drip and sore throat. Negative for congestion, rhinorrhea, sinus pain and trouble swallowing.   Respiratory: Positive for cough. Negative for chest tightness, shortness of breath and wheezing.   Cardiovascular: Negative for chest pain, palpitations and leg swelling.  Gastrointestinal: Negative for abdominal distention, abdominal pain, constipation, diarrhea, nausea and vomiting.  Allergic/Immunologic: Positive for environmental allergies.  Psychiatric/Behavioral: Positive for confusion. Negative for  agitation and dysphoric mood. The patient is not nervous/anxious.       Objective:   Physical Exam  Constitutional: She is oriented to person, place, and time. She appears well-developed and well-nourished. No distress.  HENT:  Head: Normocephalic and atraumatic.  Right Ear: Tympanic membrane is injected. A middle ear effusion is present.  Left Ear: Tympanic membrane is injected. A middle ear effusion is present.  Nose: Rhinorrhea (purulent) present.  Mouth/Throat: Oropharynx is clear and moist.  L TM: scarring Nares: erythematous  Eyes: Conjunctivae and EOM are normal.  Neck: Neck supple. No tracheal deviation present. No thyromegaly present.  Fullness above the proximal clavicle bilaterally  Cardiovascular: Normal rate.   Pulmonary/Chest: Effort normal. No respiratory distress. She has rales (Fine throughout).  Good air movement  Musculoskeletal: Normal range of motion.  Lymphadenopathy:       Head (right side): Tonsillar adenopathy present.       Head (left side): Tonsillar adenopathy present.    She has cervical adenopathy.       Right: No supraclavicular adenopathy present.       Left: No supraclavicular adenopathy present.  Small firm lymph nodes in the R upper anterior cervical  Neurological: She is alert and oriented to person, place, and time.  Skin: Skin is warm and dry.  Psychiatric: She has a normal mood and affect. Her behavior is normal.  Nursing note and vitals reviewed.  BP 134/72 (BP Location: Right Arm, Patient Position: Sitting, Cuff Size: Normal)   Pulse 73   Temp 97.7 F (36.5 C)   Resp 16   Ht 5\' 1"  (1.549 m)   Wt 152 lb (68.9 kg)   SpO2 97%   BMI 28.72 kg/m     Results for orders placed or performed in visit on 02/10/16  POCT CBC  Result Value Ref Range   WBC 8.1 4.6 - 10.2 K/uL   Lymph, poc 2.7 0.6 - 3.4   POC LYMPH PERCENT 32.8 10 - 50 %L   MID (cbc) 0.6 0 - 0.9   POC MID % 7.7 0 - 12 %M   POC Granulocyte 4.8 2 - 6.9   Granulocyte percent  59.5 37 - 80 %G   RBC 5.03 4.04 - 5.48 M/uL   Hemoglobin 13.9 12.2 - 16.2 g/dL   HCT, POC 40.4 37.7 - 47.9 %   MCV 80.2 80 - 97 fL   MCH, POC 27.6 27 - 31.2 pg   MCHC 34.4 31.8 - 35.4 g/dL   RDW, POC 14.5 %   Platelet Count, POC 259 142 - 424 K/uL   MPV 7.6 0 - 99.8 fL   Results for orders placed or performed in visit on 02/10/16  Sedimentation Rate  Result Value Ref Range   Sed Rate 4 0 - 40 mm/hr  TSH  Result Value Ref Range   TSH 5.760 (H) 0.450 - 4.500 uIU/mL  Comprehensive metabolic panel  Result Value Ref Range   Glucose 90 65 -  99 mg/dL   BUN 13 8 - 27 mg/dL   Creatinine, Ser 0.69 0.57 - 1.00 mg/dL   GFR calc non Af Amer 85 >59 mL/min/1.73   GFR calc Af Amer 98 >59 mL/min/1.73   BUN/Creatinine Ratio 19 12 - 28   Sodium 140 134 - 144 mmol/L   Potassium 4.5 3.5 - 5.2 mmol/L   Chloride 96 96 - 106 mmol/L   CO2 30 (H) 18 - 29 mmol/L   Calcium 9.3 8.7 - 10.3 mg/dL   Total Protein 7.4 6.0 - 8.5 g/dL   Albumin 4.8 3.5 - 4.8 g/dL   Globulin, Total 2.6 1.5 - 4.5 g/dL   Albumin/Globulin Ratio 1.8 1.2 - 2.2   Bilirubin Total 0.3 0.0 - 1.2 mg/dL   Alkaline Phosphatase 141 (H) 39 - 117 IU/L   AST 17 0 - 40 IU/L   ALT 8 0 - 32 IU/L  VITAMIN D 25 Hydroxy (Vit-D Deficiency, Fractures)  Result Value Ref Range   Vit D, 25-Hydroxy 24.7 (L) 30.0 - 100.0 ng/mL  POCT CBC  Result Value Ref Range   WBC 8.1 4.6 - 10.2 K/uL   Lymph, poc 2.7 0.6 - 3.4   POC LYMPH PERCENT 32.8 10 - 50 %L   MID (cbc) 0.6 0 - 0.9   POC MID % 7.7 0 - 12 %M   POC Granulocyte 4.8 2 - 6.9   Granulocyte percent 59.5 37 - 80 %G   RBC 5.03 4.04 - 5.48 M/uL   Hemoglobin 13.9 12.2 - 16.2 g/dL   HCT, POC 40.4 37.7 - 47.9 %   MCV 80.2 80 - 97 fL   MCH, POC 27.6 27 - 31.2 pg   MCHC 34.4 31.8 - 35.4 g/dL   RDW, POC 14.5 %   Platelet Count, POC 259 142 - 424 K/uL   MPV 7.6 0 - 99.8 fL   Dg Chest 2 View  Result Date: 02/10/2016 CLINICAL DATA:  Productive cough and pneumonia, 6 weeks duration. EXAM: CHEST  2  VIEW COMPARISON:  None. FINDINGS: Heart size is normal. There is atherosclerosis of the aorta. There is a moderate size hiatal hernia. Calcified granuloma in the right mid lung laterally. No infiltrate, collapse or effusion. No acute bone finding. Previous shoulder replacement on the right. Mild spinal curvature. IMPRESSION: No active cardiopulmonary disease. Moderate size hiatal hernia. Right lung granuloma. Electronically Signed   By: Nelson Chimes M.D.   On: 02/10/2016 16:58    Assessment & Plan:   1. Cough - likely recovering from bronchitis but suspect pnd exacerbating. Restart flonase.  Gave sinus rinse for pt to try.  Declines tessalon pearles - "don't work" but req cough syrup - try promethazine-phenylephrine. 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 Unfortunately, she does not know where her prior imaging was (?[facility] - need to check under CareEverywhere?) so will check sed rate to r/o interstitial lung dz or other progressive etiology.  2. Essential hypertension - with symptomatic palpitations which are well controlled on toprol 100 qd. However blood pressures here and at other visits have been slightly above goal. Rates appear on high and above range despite BB. Could increase to 200 for improved control but I'm concerned this may exacerbate COPD so consider adding in additional antihypertensive. Discussed at f/u.   3. High cholesterol - on lipitor, exacerbating her fibromyalgia? Due to age and lack of other sig risk  factors, it might be reasonable to do trial off at some point but would be good to get baseline flp on first.   4. Hiatal hernia - failed mult other ppis - needs to stay on nexium  5. Hypothyroidism, unspecified type - tsh mildly elev - will try increasing levothyroxine from 50 to 75 but will need to monitor very closely due to psychiatric hx.  Needs repeat tsh in 6 wks while still on medication for any  additional refills. If pt does not follow-up for recheck, would rec decreasing back to 50 due to age and comorbidities  6. Vitamin D deficiency - pt requests once wkly high dose vit D and mvi - send ot pharm  7.       IBS-C - well controlled on mix of daily miralax and citrucel  Pt reports her last visit with her PCP in [place] was about 6 mos prior. Asked pt to recheck in 6 wks for repeat tsh.  Need to find when she will be due for her AWV. Orders Placed This Encounter  Procedures  . DG Chest 2 View    Standing Status:   Future    Number of Occurrences:   1    Standing Expiration Date:   02/09/2017    Order Specific Question:   Reason for Exam (SYMPTOM  OR DIAGNOSIS REQUIRED)    Answer:   continued productive cough, hosp for pna 6 wks prior, diffuse insp rales    Order Specific Question:   Preferred imaging location?    Answer:   External  . Sedimentation Rate  . TSH  . Comprehensive metabolic panel  . VITAMIN D 25 Hydroxy (Vit-D Deficiency, Fractures)  . POCT CBC    Meds ordered this encounter  Medications  . ergocalciferol (VITAMIN D2) 50000 units capsule    Sig: Take 1 capsule (50,000 Units total) by mouth once a week.    Dispense:  12 capsule    Refill:  0  . Multiple Vitamins-Minerals (MULTIVITAMIN) tablet    Sig: Take 1 tablet by mouth daily.    Dispense:  90 tablet    Refill:  3    Any multivitamin for older adults would be great, thanks. If anything is on her formulary, that would be wonderful.  . fluticasone (FLONASE) 50 MCG/ACT nasal spray    Sig: Place 2 sprays into both nostrils at bedtime.    Dispense:  48 g    Refill:  1    Pt requests several bottles so tried for 3 mo supply  . promethazine-phenylephrine (PROMETHAZINE VC PLAIN) 6.25-5 MG/5ML SYRP    Sig: Take 5 mLs by mouth every 4 (four) hours as needed for congestion.    Dispense:  140 mL    Refill:  0    I personally performed the services described in this documentation, which was scribed in my presence.  The recorded information has been reviewed and considered, and addended by me as needed.   [name], M.D.  [facility] [address] [place], [place] [address] [phone] phone [phone] fax  02/12/16 12:20 AM

## 2016-02-10 NOTE — Patient Instructions (Signed)
     IF you received an x-ray today, you will receive an invoice from [facility]. Please contact [facility] at [phone] with questions or concerns regarding your invoice.   IF you received labwork today, you will receive an invoice from [facility]. Please contact [facility] at [phone] with questions or concerns regarding your invoice.   Our billing staff will not be able to assist you with questions regarding bills from these companies.  You will be contacted with the lab results as soon as they are available. The fastest way to get your results is to activate your My Chart account. Instructions are located on the last page of this paperwork. If you have not heard from us regarding the results in 2 weeks, please contact this office.      

## 2016-02-11 LAB — COMPREHENSIVE METABOLIC PANEL
ALBUMIN: 4.8 g/dL (ref 3.5–4.8)
ALK PHOS: 141 IU/L — AB (ref 39–117)
ALT: 8 IU/L (ref 0–32)
AST: 17 IU/L (ref 0–40)
Albumin/Globulin Ratio: 1.8 (ref 1.2–2.2)
BUN / CREAT RATIO: 19 (ref 12–28)
BUN: 13 mg/dL (ref 8–27)
Bilirubin Total: 0.3 mg/dL (ref 0.0–1.2)
CALCIUM: 9.3 mg/dL (ref 8.7–10.3)
CO2: 30 mmol/L — AB (ref 18–29)
CREATININE: 0.69 mg/dL (ref 0.57–1.00)
Chloride: 96 mmol/L (ref 96–106)
GFR calc Af Amer: 98 mL/min/{1.73_m2} (ref >59)
GFR calc non Af Amer: 85 mL/min/{1.73_m2} (ref >59)
GLUCOSE: 90 mg/dL (ref 65–99)
Globulin, Total: 2.6 g/dL (ref 1.5–4.5)
Potassium: 4.5 mmol/L (ref 3.5–5.2)
Sodium: 140 mmol/L (ref 134–144)
TOTAL PROTEIN: 7.4 g/dL (ref 6.0–8.5)

## 2016-02-11 LAB — TSH: TSH: 5.76 u[IU]/mL — ABNORMAL HIGH (ref 0.450–4.500)

## 2016-02-11 LAB — VITAMIN D 25 HYDROXY (VIT D DEFICIENCY, FRACTURES): VIT D 25 HYDROXY: 24.7 ng/mL — AB (ref 30.0–100.0)

## 2016-02-11 LAB — SEDIMENTATION RATE: Sed Rate: 4 mm/hr (ref 0–40)

## 2016-02-12 MED ORDER — LEVOTHYROXINE SODIUM 75 MCG PO TABS: 75.0000 ug | ORAL_TABLET | Freq: Every day | ORAL | 1 refills | Status: DC

## 2016-02-13 ENCOUNTER — Encounter: Payer: Self-pay | Admitting: Pediatric Intensive Care

## 2016-02-14 ENCOUNTER — Telehealth: Payer: Self-pay

## 2016-02-14 NOTE — Telephone Encounter (Signed)
Patient has pnuemonia - lives at [facility].    Wants   A note stating how long she can stand up.  Needs bed rest.  Can only sit, stand .   Homeless   They  Let her lay down if she has a doctors note.    [phone]

## 2016-02-14 NOTE — Telephone Encounter (Signed)
PATIENT CALLED BACK TO GIVE THE FAX NUMBER TO [facility] FOR THE NOTE TO LAY DOWN DURING THE DAY. FAX IS: [phone] (ATTN) [place].  [place]

## 2016-02-16 NOTE — Telephone Encounter (Signed)
Are you able to write this letter for the patient?

## 2016-02-17 ENCOUNTER — Encounter: Payer: Self-pay | Admitting: Pediatric Intensive Care

## 2016-02-17 ENCOUNTER — Telehealth: Payer: Self-pay

## 2016-02-17 MED ORDER — DOXYCYCLINE HYCLATE 100 MG PO CAPS: 100.0000 mg | ORAL_CAPSULE | Freq: Two times a day (BID) | ORAL | 0 refills | Status: DC

## 2016-02-17 NOTE — Telephone Encounter (Signed)
Talked with [place] - pt has

## 2016-02-17 NOTE — Telephone Encounter (Signed)
RN @ [facility] called requesting ABX for pt.  She does not have resources to hire cab for transportation today.    RN states pt now has expiratory wheezing (seen in her office this am) And Productive cough - yellow/gray in color Afebrile.  Call med to [facility] and RN will pick up the copay.

## 2016-02-20 ENCOUNTER — Encounter (HOSPITAL_COMMUNITY): Payer: Self-pay | Admitting: Emergency Medicine

## 2016-02-20 ENCOUNTER — Emergency Department (HOSPITAL_COMMUNITY)
Admission: EM | Admit: 2016-02-20 | Discharge: 2016-02-20 | Disposition: A | Discharge status: Home / Self Care | Payer: Medicare Other | Attending: Emergency Medicine | Admitting: Emergency Medicine

## 2016-02-20 ENCOUNTER — Emergency Department (HOSPITAL_COMMUNITY): Payer: Medicare Other

## 2016-02-20 DIAGNOSIS — Z96659 Presence of unspecified artificial knee joint: Secondary | ICD-10-CM | POA: Insufficient documentation

## 2016-02-20 DIAGNOSIS — Z96619 Presence of unspecified artificial shoulder joint: Secondary | ICD-10-CM | POA: Insufficient documentation

## 2016-02-20 DIAGNOSIS — I10 Essential (primary) hypertension: Secondary | ICD-10-CM | POA: Diagnosis not present

## 2016-02-20 DIAGNOSIS — J40 Bronchitis, not specified as acute or chronic: Secondary | ICD-10-CM | POA: Diagnosis not present

## 2016-02-20 DIAGNOSIS — Z87891 Personal history of nicotine dependence: Secondary | ICD-10-CM | POA: Insufficient documentation

## 2016-02-20 DIAGNOSIS — R05 Cough: Secondary | ICD-10-CM | POA: Diagnosis present

## 2016-02-20 MED ORDER — ALBUTEROL SULFATE HFA 108 (90 BASE) MCG/ACT IN AERS
2.0000 | INHALATION_SPRAY | RESPIRATORY_TRACT | Status: DC
  Administered 2016-02-20: 2 via RESPIRATORY_TRACT
  Filled 2016-02-20: qty 6.7

## 2016-02-20 MED ORDER — ALBUTEROL SULFATE (2.5 MG/3ML) 0.083% IN NEBU
5.0000 mg | INHALATION_SOLUTION | Freq: Once | RESPIRATORY_TRACT | Status: AC
  Administered 2016-02-20: 5 mg via RESPIRATORY_TRACT
  Filled 2016-02-20: qty 6

## 2016-02-20 MED ORDER — IPRATROPIUM BROMIDE 0.02 % IN SOLN
0.5000 mg | Freq: Once | RESPIRATORY_TRACT | Status: AC
  Administered 2016-02-20: 0.5 mg via RESPIRATORY_TRACT
  Filled 2016-02-20: qty 2.5

## 2016-02-20 MED ORDER — PREDNISONE 50 MG PO TABS: ORAL_TABLET | ORAL | 0 refills | Status: DC

## 2016-02-20 MED ORDER — PREDNISONE 20 MG PO TABS
60.0000 mg | ORAL_TABLET | Freq: Once | ORAL | Status: AC
  Administered 2016-02-20: 60 mg via ORAL
  Filled 2016-02-20: qty 3

## 2016-02-20 NOTE — Telephone Encounter (Signed)
Yes, please write letter.  She can stand for up to 10 minutes but should otherwise sit.  Should spend most of day laying down for the next week until her illness has passed.

## 2016-02-20 NOTE — ED Triage Notes (Signed)
Pt reports ongoing productive cough for the past few weeks. Has had soreness in chest, back, and shoulder area due to cough. Also felt weak this am. Pt concerned because her cough is productive.  Hx of PNA. Was seen for this at [facility] on 12/8 with no sign PNA. Was put on doxycycline on 12/15 which pt says is for PNA. Pt alert, oriented, ambulatory, and speaking in complete sentences with no difficulty.

## 2016-02-20 NOTE — Congregational Nurse Program (Signed)
Congregational Nurse Program Note  Date of Encounter: 02/17/2016  Past Medical History: Past Medical History:  Diagnosis Date  . Hiatal hernia   . High cholesterol   . Hypertension   . PNA (pneumonia)   . Thyroid disease     Encounter Details:     CNP Questionnaire - 02/17/16 0900      Patient Demographics   Is this a new or existing patient? Existing   Patient is considered a/an Not Applicable   Race Caucasian/White     Patient Assistance   Location of Patient Assistance [facility]   Patient's financial/insurance status Medicaid;Medicare   Uninsured Patient ([facility]) No   Patient referred to apply for the following financial assistance Not Applicable   Food insecurities addressed Not Applicable   Transportation assistance No   Assistance securing medications No   Educational health offerings Acute disease     Encounter Details   Primary purpose of visit Acute Illness/Condition Visit   Was an Emergency Department visit averted? Yes   Does patient have a medical provider? Yes   Patient referred to Follow up with established PCP   Was a mental health screening completed? (GAINS tool) No   Does patient have dental issues? No   Does patient have vision issues? No   Does your patient have an abnormal blood pressure today? No   Since previous encounter, have you referred patient for abnormal blood pressure that resulted in a new diagnosis or medication change? No   Does your patient have an abnormal blood glucose today? No   Since previous encounter, have you referred patient for abnormal blood glucose that resulted in a new diagnosis or medication change? No   Was there a life-saving intervention made? No     Client states worsening cough and productive (green/gray sputum) since Monday. Afebrile. BBS-fair air movement but expiratory wheeze noted. CN called PCP office and PCP will prescribe antibiotics. [facility] and [facility] will deliver this  afternoon. Client advised to go to ED if any increased difficulty breathing or fever over the weekend. Client will return to clinic on Monday.

## 2016-02-20 NOTE — Telephone Encounter (Signed)
Talked with Isabel Collins.  Sent in antibiotic as pt has now developed a productive cough with increased sputum.  Wheezing cleared with albuterol use.  Sent in doxycycline Meds ordered this encounter  Medications  . doxycycline (VIBRAMYCIN) 100 MG capsule    Sig: Take 1 capsule (100 mg total) by mouth 2 (two) times daily.    Dispense:  14 capsule    Refill:  0

## 2016-02-20 NOTE — ED Provider Notes (Signed)
[facility] DEPT Provider Note   CSN: [id number] Arrival date & time: 02/20/16  [id number]     History   Chief Complaint Chief Complaint  Patient presents with  . Cough    HPI Isabel Collins is a 75 y.o. female.  75 y/o female w/ h/o pna now w/ cough x 3 weeks Lives in a homeless shelter Saw her doctor last week and placed on doxycycline on Friday (had a negative cxr) Cough productive w/ green sputum and w/ chest wall pain worse w/ cough or moving  Slight dyspnea w/o chf or angina sx No fever, vomiting, or diarrhea Also c/o uri sx of head congestion       Past Medical History:  Diagnosis Date  . Hiatal hernia   . High cholesterol   . Hypertension   . PNA (pneumonia)   . Thyroid disease     Patient Active Problem List   Diagnosis Date Noted  . Hypertension 02/10/2016  . High cholesterol 02/10/2016  . Hiatal hernia 02/10/2016    Past Surgical History:  Procedure Laterality Date  . arm surgery    . BREAST SURGERY    . REPLACEMENT TOTAL KNEE    . TOTAL SHOULDER REPLACEMENT      OB History    No data available       Home Medications    Prior to Admission medications   Medication Sig Start Date End Date Taking? Authorizing Provider  atorvastatin (LIPITOR) 40 MG tablet Take 40 mg by mouth at bedtime.   Yes Historical Provider, MD  celecoxib (CELEBREX) 200 MG capsule Take 200 mg by mouth 2 (two) times daily.   Yes Historical Provider, MD  dicyclomine (BENTYL) 10 MG capsule Take 20 mg by mouth 3 (three) times daily as needed for spasms.   Yes Historical Provider, MD  doxycycline (VIBRAMYCIN) 100 MG capsule Take 1 capsule (100 mg total) by mouth 2 (two) times daily. 02/17/16  Yes [name], MD  DULoxetine (CYMBALTA) 30 MG capsule Take 30 mg by mouth daily. 01/13/16  Yes Historical Provider, MD  ergocalciferol (VITAMIN D2) 50000 units capsule Take 1 capsule (50,000 Units total) by mouth once a week. 02/10/16  Yes [name], MD  esomeprazole (NEXIUM) 40 MG  capsule Take 40 mg by mouth daily at 12 noon.   Yes Historical Provider, MD  fluticasone (FLONASE) 50 MCG/ACT nasal spray Place 2 sprays into both nostrils at bedtime. 02/10/16  Yes [name], MD  levothyroxine (SYNTHROID, LEVOTHROID) 75 MCG tablet Take 1 tablet (75 mcg total) by mouth daily before breakfast. You need an office visit while still on med for refills 02/12/16  Yes [name], MD  LYRICA 150 MG capsule Take 150 mg by mouth 2 (two) times daily. 01/13/16  Yes Historical Provider, MD  methylcellulose (CITRUCEL) oral powder Take 1 packet by mouth daily. 01/31/16  Yes Billy Fischer, MD  metoprolol succinate (TOPROL-XL) 100 MG 24 hr tablet Take 100 mg by mouth every morning. Take with or immediately following a meal.   Yes Historical Provider, MD  Multiple Vitamins-Minerals (MULTIVITAMIN) tablet Take 1 tablet by mouth daily. 02/10/16  Yes [name], MD  polyethylene glycol powder (MIRALAX) powder Take 17 g by mouth daily. 01/31/16  Yes Billy Fischer, MD  promethazine-phenylephrine (PROMETHAZINE VC PLAIN) 6.25-5 MG/5ML SYRP Take 5 mLs by mouth every 4 (four) hours as needed for congestion. 02/10/16  Yes [name], MD  traZODone (DESYREL) 50 MG tablet Take 50  mg by mouth at bedtime as needed for sleep.   Yes Historical Provider, MD    Family History History reviewed. No pertinent family history.  Social History Social History  Substance Use Topics  . Smoking status: Former Research scientist (life sciences)  . Smokeless tobacco: Never Used  . Alcohol use No     Allergies   Patient has no known allergies.   Review of Systems Review of Systems  All other systems reviewed and are negative.    Physical Exam Updated Vital Signs BP 156/76 (BP Location: Left Arm)   Pulse 87   Temp 97.9 F (36.6 C) (Oral)   Resp 16   SpO2 96%   Physical Exam  Constitutional: She is oriented to person, place, and time. She appears well-developed and well-nourished.  Non-toxic appearance. No distress.  HENT:  Head:  Normocephalic and atraumatic.  Eyes: Conjunctivae, EOM and lids are normal. Pupils are equal, round, and reactive to light.  Neck: Normal range of motion. Neck supple. No tracheal deviation present. No thyroid mass present.  Cardiovascular: Normal rate, regular rhythm and normal heart sounds.  Exam reveals no gallop.   No murmur heard. Pulmonary/Chest: Effort normal. No stridor. No respiratory distress. She has decreased breath sounds. She has wheezes. She has no rhonchi. She has no rales.  Abdominal: Soft. Normal appearance and bowel sounds are normal. She exhibits no distension. There is no tenderness. There is no rebound and no CVA tenderness.  Musculoskeletal: Normal range of motion. She exhibits no edema or tenderness.  Neurological: She is alert and oriented to person, place, and time. She has normal strength. No cranial nerve deficit or sensory deficit. GCS eye subscore is 4. GCS verbal subscore is 5. GCS motor subscore is 6.  Skin: Skin is warm and dry. No abrasion and no rash noted.  Psychiatric: She has a normal mood and affect. Her speech is normal and behavior is normal.  Nursing note and vitals reviewed.    ED Treatments / Results  Labs (all labs ordered are listed, but only abnormal results are displayed) Labs Reviewed - No data to display  EKG  EKG Interpretation None       Radiology No results found.  Procedures Procedures (including critical care time)  Medications Ordered in ED Medications - No data to display   Initial Impression / Assessment and Plan / ED Course  I have reviewed the triage vital signs and the nursing notes.  Pertinent labs & imaging results that were available during my care of the patient were reviewed by me and considered in my medical decision making (see chart for details).  Clinical Course     Patient treated with prednisone and albuterol here and feels better. Suspect that she has a viral bronchitis. Will discharge with albuterol  inhaler and placed in a short course of prednisone. Chest x-ray was negative  Final Clinical Impressions(s) / ED Diagnoses   Final diagnoses:  None    New Prescriptions New Prescriptions   No medications on file     Lacretia Leigh, MD 02/20/16 1038

## 2016-02-20 NOTE — ED Notes (Signed)
Bed: [id number] Expected date:  Expected time:  Means of arrival:  Comments: EMS- SOB/sts PNA x 3 weeks

## 2016-02-20 NOTE — Congregational Nurse Program (Signed)
Congregational Nurse Program Note  Date of Encounter: 02/13/2016  Past Medical History: Past Medical History:  Diagnosis Date  . Hiatal hernia   . High cholesterol   . Hypertension   . PNA (pneumonia)   . Thyroid disease     Encounter Details:     CNP Questionnaire - 02/17/16 0900      Patient Demographics   Is this a new or existing patient? Existing   Patient is considered a/an Not Applicable   Race Caucasian/White     Patient Assistance   Location of Patient Assistance [facility]   Patient's financial/insurance status Medicaid;Medicare   Uninsured Patient ([facility]) No   Patient referred to apply for the following financial assistance Not Applicable   Food insecurities addressed Not Applicable   Transportation assistance No   Assistance securing medications No   Educational health offerings Acute disease     Encounter Details   Primary purpose of visit Acute Illness/Condition Visit   Was an Emergency Department visit averted? Yes   Does patient have a medical provider? Yes   Patient referred to Follow up with established PCP   Was a mental health screening completed? (GAINS tool) No   Does patient have dental issues? No   Does patient have vision issues? No   Does your patient have an abnormal blood pressure today? No   Since previous encounter, have you referred patient for abnormal blood pressure that resulted in a new diagnosis or medication change? No   Does your patient have an abnormal blood glucose today? No   Since previous encounter, have you referred patient for abnormal blood glucose that resulted in a new diagnosis or medication change? No   Was there a life-saving intervention made? No     Client in for follow up post-pcp visit. States she is still coughing and has nasal drainage. She has not been running a fever per her report. T- 98. BBS- Good air movement with occasional expiratory wheeze. Client to foolow up as needed.

## 2016-02-21 ENCOUNTER — Encounter: Payer: Self-pay | Admitting: Pediatric Intensive Care

## 2016-02-21 MED FILL — predniSONE 50 MG TABS: 50 | 4 days supply | Qty: 4 | Fill #0

## 2016-02-21 NOTE — Telephone Encounter (Signed)
Wrote and faxed letter to fax # provided w/confirmation.

## 2016-02-24 ENCOUNTER — Encounter: Payer: Self-pay | Admitting: Pediatric Intensive Care

## 2016-02-24 ENCOUNTER — Other Ambulatory Visit: Payer: Self-pay | Admitting: Family Medicine

## 2016-02-24 MED ORDER — METOPROLOL SUCCINATE ER 100 MG PO TB24: 100.0000 mg | ORAL_TABLET | ORAL | 1 refills | Status: DC

## 2016-03-02 ENCOUNTER — Encounter: Payer: Self-pay | Admitting: Pediatric Intensive Care

## 2016-03-08 NOTE — Congregational Nurse Program (Signed)
Congregational Nurse Program Note  Date of Encounter: 03/02/2016  Past Medical History: Past Medical History:  Diagnosis Date  . Hiatal hernia   . High cholesterol   . Hypertension   . PNA (pneumonia)   . Thyroid disease     Encounter Details:     CNP Questionnaire - 03/02/16 0935      Patient Demographics   Is this a new or existing patient? Existing   Patient is considered a/an Not Applicable   Race Caucasian/White     Patient Assistance   Location of Patient Assistance [facility]   Patient's financial/insurance status Medicaid;Medicare   Uninsured Patient ([facility]) No   Patient referred to apply for the following financial assistance Not Applicable   Food insecurities addressed Not Applicable   Transportation assistance No   Type of Assistance Other   Assistance securing medications Yes   Type of Assistance Other   Educational health offerings Acute disease;Navigating the healthcare system;Chronic disease     Encounter Details   Primary purpose of visit Acute Illness/Condition Visit;Chronic Illness/Condition Visit;Education/Health Concerns   Was an Emergency Department visit averted? Not Applicable   Does patient have a medical provider? Yes   Patient referred to Follow up with established PCP   Was a mental health screening completed? (GAINS tool) No   Does patient have dental issues? No   Does patient have vision issues? No   Does your patient have an abnormal blood pressure today? No   Since previous encounter, have you referred patient for abnormal blood pressure that resulted in a new diagnosis or medication change? No   Does your patient have an abnormal blood glucose today? No   Since previous encounter, have you referred patient for abnormal blood glucose that resulted in a new diagnosis or medication change? No   Was there a life-saving intervention made? No     BP check. Client wants [facility] transferred [facility] and states that she  will get there to pick them up. Client PCP notified that she need metoprolol refill.

## 2016-03-10 NOTE — Congregational Nurse Program (Signed)
Congregational Nurse Program Note  Date of Encounter: 02/24/2016  Past Medical History: Past Medical History:  Diagnosis Date  . Hiatal hernia   . High cholesterol   . Hypertension   . PNA (pneumonia)   . Thyroid disease     Encounter Details:     CNP Questionnaire - 03/02/16 0935      Patient Demographics   Is this a new or existing patient? Existing   Patient is considered a/an Not Applicable   Race Caucasian/White     Patient Assistance   Location of Patient Assistance [facility]   Patient's financial/insurance status Medicaid;Medicare   Uninsured Patient ([facility]) No   Patient referred to apply for the following financial assistance Not Applicable   Food insecurities addressed Not Applicable   Transportation assistance No   Type of Assistance Other   Assistance securing medications Yes   Type of Assistance Other   Educational health offerings Acute disease;Navigating the healthcare system;Chronic disease     Encounter Details   Primary purpose of visit Acute Illness/Condition Visit;Chronic Illness/Condition Visit;Education/Health Concerns   Was an Emergency Department visit averted? Not Applicable   Does patient have a medical provider? Yes   Patient referred to Follow up with established PCP   Was a mental health screening completed? (GAINS tool) No   Does patient have dental issues? No   Does patient have vision issues? No   Does your patient have an abnormal blood pressure today? No   Since previous encounter, have you referred patient for abnormal blood pressure that resulted in a new diagnosis or medication change? No   Does your patient have an abnormal blood glucose today? No   Since previous encounter, have you referred patient for abnormal blood glucose that resulted in a new diagnosis or medication change? No   Was there a life-saving intervention made? No     BP check. Client still has URI symptoms but she states they are improving. Client  states that she is having regular BMs now.Client [facility]. She is low on metoprolol- CN will contact PCP office. BBS- good air movement but will faint expiratory wheeze. Intermittent non-productive cough. Will Follow up with CN as needed.

## 2016-03-10 NOTE — Congregational Nurse Program (Signed)
Congregational Nurse Program Note  Date of Encounter: 02/21/2016  Past Medical History: Past Medical History:  Diagnosis Date  . Hiatal hernia   . High cholesterol   . Hypertension   . PNA (pneumonia)   . Thyroid disease     Encounter Details:     CNP Questionnaire - 03/02/16 0935      Patient Demographics   Is this a new or existing patient? Existing   Patient is considered a/an Not Applicable   Race Caucasian/White     Patient Assistance   Location of Patient Assistance [facility]   Patient's financial/insurance status Medicaid;Medicare   Uninsured Patient ([facility]) No   Patient referred to apply for the following financial assistance Not Applicable   Food insecurities addressed Not Applicable   Transportation assistance No   Type of Assistance Other   Assistance securing medications Yes   Type of Assistance Other   Educational health offerings Acute disease;Navigating the healthcare system;Chronic disease     Encounter Details   Primary purpose of visit Acute Illness/Condition Visit;Chronic Illness/Condition Visit;Education/Health Concerns   Was an Emergency Department visit averted? Not Applicable   Does patient have a medical provider? Yes   Patient referred to Follow up with established PCP   Was a mental health screening completed? (GAINS tool) No   Does patient have dental issues? No   Does patient have vision issues? No   Does your patient have an abnormal blood pressure today? No   Since previous encounter, have you referred patient for abnormal blood pressure that resulted in a new diagnosis or medication change? No   Does your patient have an abnormal blood glucose today? No   Since previous encounter, have you referred patient for abnormal blood glucose that resulted in a new diagnosis or medication change? No   Was there a life-saving intervention made? No     Follow up post-ED visit. Clientsays she feels better but is still coughing.Client  now on course of prednisone. BBS- expiratory wheeze but good air movement. Follow up with CN on Friday.

## 2016-03-12 ENCOUNTER — Encounter: Payer: Self-pay | Admitting: Pediatric Intensive Care

## 2016-03-12 ENCOUNTER — Other Ambulatory Visit: Payer: Self-pay | Admitting: Family Medicine

## 2016-03-12 NOTE — Congregational Nurse Program (Signed)
Congregational Nurse Program Note  Date of Encounter: 02/07/2016  Past Medical History: Past Medical History:  Diagnosis Date  . Hiatal hernia   . High cholesterol   . Hypertension   . PNA (pneumonia)   . Thyroid disease     Encounter Details:     CNP Questionnaire - 03/12/16 1000      Patient Demographics   Is this a new or existing patient? Existing   Patient is considered a/an Not Applicable   Race Caucasian/White     Patient Assistance   Location of Patient Assistance [facility]   Patient's financial/insurance status Medicaid;Medicare   Uninsured Patient ([facility]) No   Patient referred to apply for the following financial assistance Not Applicable   Food insecurities addressed Not Applicable   Transportation assistance No   Type of Assistance Other   Assistance securing medications Yes   Type of Assistance [facility]   Educational health offerings Acute disease;Navigating the healthcare system     Encounter Details   Primary purpose of visit Acute Illness/Condition Visit;Post ED/Hospitalization Visit   Was an Emergency Department visit averted? Not Applicable   Does patient have a medical provider? Yes   Patient referred to Follow up with established PCP   Was a mental health screening completed? (GAINS tool) No   Does patient have dental issues? No   Does patient have vision issues? No   Does your patient have an abnormal blood pressure today? No   Since previous encounter, have you referred patient for abnormal blood pressure that resulted in a new diagnosis or medication change? No   Does your patient have an abnormal blood glucose today? No   Since previous encounter, have you referred patient for abnormal blood glucose that resulted in a new diagnosis or medication change? No   Was there a life-saving intervention made? No     Client continues agitated and paranoid. CN spoke with Linda Hedges, case manager and Shirlean Kelly, shelter director  regarding client safety and threats to leave shelter. Follow up with client as needed.

## 2016-03-12 NOTE — Congregational Nurse Program (Signed)
Congregational Nurse Program Note  Date of Encounter: 02/06/2016  Past Medical History: Past Medical History:  Diagnosis Date  . Hiatal hernia   . High cholesterol   . Hypertension   . PNA (pneumonia)   . Thyroid disease     Encounter Details:     CNP Questionnaire - 03/12/16 1000      Patient Demographics   Is this a new or existing patient? Existing   Patient is considered a/an Not Applicable   Race Caucasian/White     Patient Assistance   Location of Patient Assistance [facility]   Patient's financial/insurance status Medicaid;Medicare   Uninsured Patient ([facility]) No   Patient referred to apply for the following financial assistance Not Applicable   Food insecurities addressed Not Applicable   Transportation assistance No   Type of Assistance Other   Assistance securing medications Yes   Type of Assistance [facility]   Educational health offerings Acute disease;Navigating the healthcare system     Encounter Details   Primary purpose of visit Acute Illness/Condition Visit;Post ED/Hospitalization Visit   Was an Emergency Department visit averted? Not Applicable   Does patient have a medical provider? Yes   Patient referred to Follow up with established PCP   Was a mental health screening completed? (GAINS tool) No   Does patient have dental issues? No   Does patient have vision issues? No   Does your patient have an abnormal blood pressure today? No   Since previous encounter, have you referred patient for abnormal blood pressure that resulted in a new diagnosis or medication change? No   Does your patient have an abnormal blood glucose today? No   Since previous encounter, have you referred patient for abnormal blood glucose that resulted in a new diagnosis or medication change? No   Was there a life-saving intervention made? No     Client in clinic with complaints of continued URI symptoms. Client is agitated and has states that women in dorm  area are watching her and have been "spraying ether". CN notified shelter director Shirlean Kelly of client's level of agitation and paranoia. Client encouraged to see behavioral health CN this afternoon.

## 2016-03-12 NOTE — Congregational Nurse Program (Signed)
Congregational Nurse Program Note  Date of Encounter: 02/03/2016  Past Medical History: Past Medical History:  Diagnosis Date  . Hiatal hernia   . High cholesterol   . Hypertension   . PNA (pneumonia)   . Thyroid disease     Encounter Details:     CNP Questionnaire - 03/12/16 1000      Patient Demographics   Is this a new or existing patient? Existing   Patient is considered a/an Not Applicable   Race Caucasian/White     Patient Assistance   Location of Patient Assistance [facility]   Patient's financial/insurance status Medicaid;Medicare   Uninsured Patient ([facility]) No   Patient referred to apply for the following financial assistance Not Applicable   Food insecurities addressed Not Applicable   Transportation assistance No   Type of Assistance Other   Assistance securing medications Yes   Type of Assistance [facility]   Educational health offerings Acute disease;Navigating the healthcare system     Encounter Details   Primary purpose of visit Acute Illness/Condition Visit;Post ED/Hospitalization Visit   Was an Emergency Department visit averted? Not Applicable   Does patient have a medical provider? Yes   Patient referred to Follow up with established PCP   Was a mental health screening completed? (GAINS tool) No   Does patient have dental issues? No   Does patient have vision issues? No   Does your patient have an abnormal blood pressure today? No   Since previous encounter, have you referred patient for abnormal blood pressure that resulted in a new diagnosis or medication change? No   Does your patient have an abnormal blood glucose today? No   Since previous encounter, have you referred patient for abnormal blood glucose that resulted in a new diagnosis or medication change? No   Was there a life-saving intervention made? No     Post urgent care visit. Continues to have URI symptoms with non-productive cough. BBS- clear with good air movement  noted. Follow up with CN next week as needed.

## 2016-03-13 ENCOUNTER — Encounter: Payer: Self-pay | Admitting: Family Medicine

## 2016-03-13 ENCOUNTER — Ambulatory Visit (INDEPENDENT_AMBULATORY_CARE_PROVIDER_SITE_OTHER): Payer: Medicare Other | Admitting: Family Medicine

## 2016-03-13 VITALS — BP 148/78 | HR 87 | Temp 97.5°F | Ht 61.0 in | Wt 155.0 lb

## 2016-03-13 DIAGNOSIS — H04129 Dry eye syndrome of unspecified lacrimal gland: Secondary | ICD-10-CM | POA: Diagnosis not present

## 2016-03-13 DIAGNOSIS — J0191 Acute recurrent sinusitis, unspecified: Secondary | ICD-10-CM

## 2016-03-13 DIAGNOSIS — J449 Chronic obstructive pulmonary disease, unspecified: Secondary | ICD-10-CM | POA: Diagnosis not present

## 2016-03-13 MED ORDER — ALBUTEROL SULFATE HFA 108 (90 BASE) MCG/ACT IN AERS: 2.0000 | INHALATION_SPRAY | RESPIRATORY_TRACT | 11 refills | Status: DC | PRN

## 2016-03-13 MED ORDER — AMOXICILLIN-POT CLAVULANATE 875-125 MG PO TABS: 1.0000 | ORAL_TABLET | Freq: Two times a day (BID) | ORAL | 0 refills | Status: DC

## 2016-03-13 MED ORDER — ESOMEPRAZOLE MAGNESIUM 40 MG PO CPDR: 40.0000 mg | DELAYED_RELEASE_CAPSULE | Freq: Every day | ORAL | 5 refills | Status: DC

## 2016-03-13 MED ORDER — CYCLOSPORINE 0.05 % OP EMUL: 1.0000 [drp] | Freq: Two times a day (BID) | OPHTHALMIC | 5 refills | Status: DC

## 2016-03-13 MED ORDER — PREDNISONE 50 MG PO TABS: ORAL_TABLET | ORAL | 0 refills | Status: DC

## 2016-03-13 NOTE — Patient Instructions (Addendum)
   IF you received an x-ray today, you will receive an invoice from [facility]. Please contact [facility] at [phone] with questions or concerns regarding your invoice.   IF you received labwork today, you will receive an invoice from [facility]. Please contact [facility] at [phone] with questions or concerns regarding your invoice.   Our billing staff will not be able to assist you with questions regarding bills from these companies.  You will be contacted with the lab results as soon as they are available. The fastest way to get your results is to activate your My Chart account. Instructions are located on the last page of this paperwork. If you have not heard from us regarding the results in 2 weeks, please contact this office.      Sinusitis, Adult Sinusitis is soreness and inflammation of your sinuses. Sinuses are hollow spaces in the bones around your face. Your sinuses are located:  Around your eyes.  In the middle of your forehead.  Behind your nose.  In your cheekbones. Your sinuses and nasal passages are lined with a stringy fluid (mucus). Mucus normally drains out of your sinuses. When your nasal tissues become inflamed or swollen, the mucus can become trapped or blocked so air cannot flow through your sinuses. This allows bacteria, viruses, and funguses to grow, which leads to infection. Sinusitis can develop quickly and last for 7?10 days (acute) or for more than 12 weeks (chronic). Sinusitis often develops after a cold. What are the causes? This condition is caused by anything that creates swelling in the sinuses or stops mucus from draining, including:  Allergies.  Asthma.  Bacterial or viral infection.  Abnormally shaped bones between the nasal passages.  Nasal growths that contain mucus (nasal polyps).  Narrow sinus openings.  Pollutants, such as chemicals or irritants in the air.  A foreign object stuck in the nose.  A fungal  infection. This is rare. What increases the risk? The following factors may make you more likely to develop this condition:  Having allergies or asthma.  Having had a recent cold or respiratory tract infection.  Having structural deformities or blockages in your nose or sinuses.  Having a weak immune system.  Doing a lot of swimming or diving.  Overusing nasal sprays.  Smoking. What are the signs or symptoms? The main symptoms of this condition are pain and a feeling of pressure around the affected sinuses. Other symptoms include:  Upper toothache.  Earache.  Headache.  Bad breath.  Decreased sense of smell and taste.  A cough that may get worse at night.  Fatigue.  Fever.  Thick drainage from your nose. The drainage is often green and it may contain pus (purulent).  Stuffy nose or congestion.  Postnasal drip. This is when extra mucus collects in the throat or back of the nose.  Swelling and warmth over the affected sinuses.  Sore throat.  Sensitivity to light. How is this diagnosed? This condition is diagnosed based on symptoms, a medical history, and a physical exam. To find out if your condition is acute or chronic, your health care provider may:  Look in your nose for signs of nasal polyps.  Tap over the affected sinus to check for signs of infection.  View the inside of your sinuses using an imaging device that has a light attached (endoscope). If your health care provider suspects that you have chronic sinusitis, you may also:  Be tested for allergies.  Have a sample of   mucus taken from your nose (nasal culture) and checked for bacteria.  Have a mucus sample examined to see if your sinusitis is related to an allergy. If your sinusitis does not respond to treatment and it lasts longer than 8 weeks, you may have an MRI or CT scan to check your sinuses. These scans also help to determine how severe your infection is. In rare cases, a bone biopsy may  be done to rule out more serious types of fungal sinus disease. How is this treated? Treatment for sinusitis depends on the cause and whether your condition is chronic or acute. If a virus is causing your sinusitis, your symptoms will go away on their own within 10 days. You may be given medicines to relieve your symptoms, including:  Topical nasal decongestants. They shrink swollen nasal passages and let mucus drain from your sinuses.  Antihistamines. These drugs block inflammation that is triggered by allergies. This can help to ease swelling in your nose and sinuses.  Topical nasal corticosteroids. These are nasal sprays that ease inflammation and swelling in your nose and sinuses.  Nasal saline washes. These rinses can help to get rid of thick mucus in your nose. If your condition is caused by bacteria, you will be given an antibiotic medicine. If your condition is caused by a fungus, you will be given an antifungal medicine. Surgery may be needed to correct underlying conditions, such as narrow nasal passages. Surgery may also be needed to remove polyps. Follow these instructions at home: Medicines   Take, use, or apply over-the-counter and prescription medicines only as told by your health care provider. These may include nasal sprays.  If you were prescribed an antibiotic medicine, take it as told by your health care provider. Do not stop taking the antibiotic even if you start to feel better. Hydrate and Humidify   Drink enough water to keep your urine clear or pale yellow. Staying hydrated will help to thin your mucus.  Use a cool mist humidifier to keep the humidity level in your home above 50%.  Inhale steam for 10-15 minutes, 3-4 times a day or as told by your health care provider. You can do this in the bathroom while a hot shower is running.  Limit your exposure to cool or dry air. Rest   Rest as much as possible.  Sleep with your head raised (elevated).  Make sure to  get enough sleep each night. General instructions   Apply a warm, moist washcloth to your face 3-4 times a day or as told by your health care provider. This will help with discomfort.  Wash your hands often with soap and water to reduce your exposure to viruses and other germs. If soap and water are not available, use hand sanitizer.  Do not smoke. Avoid being around people who are smoking (secondhand smoke).  Keep all follow-up visits as told by your health care provider. This is important. Contact a health care provider if:  You have a fever.  Your symptoms get worse.  Your symptoms do not improve within 10 days. Get help right away if:  You have a severe headache.  You have persistent vomiting.  You have pain or swelling around your face or eyes.  You have vision problems.  You develop confusion.  Your neck is stiff.  You have trouble breathing. This information is not intended to replace advice given to you by your health care provider. Make sure you discuss any questions you have with   your health care provider. Document Released: 02/19/2005 Document Revised: 10/16/2015 Document Reviewed: 12/15/2014 Elsevier Interactive Patient Education  2017 Elsevier Inc.  

## 2016-03-13 NOTE — Progress Notes (Addendum)
Subjective:  By signing my name below, I, Essence Howell, attest that this documentation has been prepared under the direction and in the presence of [name], MD Electronically Signed: Ladene Artist, ED Scribe 03/13/2016 at 11:06 AM.   Patient ID: Isabel Collins, female    DOB: 06/14/1940, 76 y.o.   MRN: [id number]  Chief Complaint  Patient presents with  . Epistaxis    X 3 weeks left side nose pain/bleeding   HPI HPI Comments: Isabel Collins is a 76 y.o. female who presents to Primary Care at [facility] complaining of intermittent epistaxis on the left for the past 3 weeks. Pt states that she was using saline sprays throughout the week and Flonase every few days, but she stopped using Flonase last week and restarted it 2-3 days ago. However, she reports that sprays have made epistaxis worse, especially in the morning. She has noticed soreness in the left nostril, nasal congestion, swelling around both eyes, eye pain that she describes as a burning sensation due to "lack of tear ducts", HAs. She states that her cough has resolved. She wants to start Restasis at this visit.   GERD Pt also requests to start an acid-reflux medication at this visit. She has noticed that symptoms are more severe in the mornings. Pt states that she has tried a "white pill" in the past that didn't work and a "purple pill" in the past that provided significant relief. Chart review shows that pt has failed Omeprazole.   Past Medical History:  Diagnosis Date  . Hiatal hernia   . High cholesterol   . Hypertension   . PNA (pneumonia)   . Thyroid disease    Current Outpatient Prescriptions on File Prior to Visit  Medication Sig Dispense Refill  . atorvastatin (LIPITOR) 40 MG tablet Take 40 mg by mouth at bedtime.    . celecoxib (CELEBREX) 200 MG capsule Take 200 mg by mouth 2 (two) times daily.    Marland Kitchen dicyclomine (BENTYL) 10 MG capsule Take 20 mg by mouth 3 (three) times daily as needed for spasms.    Marland Kitchen doxycycline  (VIBRAMYCIN) 100 MG capsule Take 1 capsule (100 mg total) by mouth 2 (two) times daily. 14 capsule 0  . DULoxetine (CYMBALTA) 30 MG capsule Take 30 mg by mouth daily.    . ergocalciferol (VITAMIN D2) 50000 units capsule Take 1 capsule (50,000 Units total) by mouth once a week. 12 capsule 0  . esomeprazole (NEXIUM) 40 MG capsule Take 40 mg by mouth daily at 12 noon.    . fluticasone (FLONASE) 50 MCG/ACT nasal spray Place 2 sprays into both nostrils at bedtime. 48 g 1  . levothyroxine (SYNTHROID, LEVOTHROID) 75 MCG tablet Take 1 tablet (75 mcg total) by mouth daily before breakfast. You need an office visit while still on med for refills 30 tablet 1  . LYRICA 150 MG capsule Take 150 mg by mouth 2 (two) times daily.    . methylcellulose (CITRUCEL) oral powder Take 1 packet by mouth daily. 1418 tablet 2  . metoprolol succinate (TOPROL-XL) 100 MG 24 hr tablet Take 1 tablet (100 mg total) by mouth every morning. Take with or immediately following a meal. 30 tablet 1  . Multiple Vitamins-Minerals (MULTIVITAMIN) tablet Take 1 tablet by mouth daily. 90 tablet 3  . polyethylene glycol powder (MIRALAX) powder Take 17 g by mouth daily. 225 g 5  . predniSONE (DELTASONE) 50 MG tablet One by mouth daily 4 days 4 tablet 0  . promethazine-phenylephrine (PROMETHAZINE  VC PLAIN) 6.25-5 MG/5ML SYRP Take 5 mLs by mouth every 4 (four) hours as needed for congestion. 140 mL 0  . traZODone (DESYREL) 50 MG tablet Take 50 mg by mouth at bedtime as needed for sleep.     No current facility-administered medications on file prior to visit.    No Known Allergies   Review of Systems  Constitutional: Positive for activity change and fatigue. Negative for appetite change, chills, fever and unexpected weight change.  HENT: Positive for congestion, dental problem, facial swelling (around eyes), nosebleeds, postnasal drip, rhinorrhea, sinus pain and sinus pressure. Negative for ear discharge, ear pain, sore throat and trouble  swallowing.   Eyes: Positive for pain.  Respiratory: Negative for cough, chest tightness and shortness of breath.   Neurological: Positive for headaches.  Psychiatric/Behavioral: Positive for agitation, behavioral problems and dysphoric mood. Negative for decreased concentration.      Objective:   Physical Exam  Constitutional: She is oriented to person, place, and time. She appears well-developed and well-nourished. No distress.  HENT:  Head: Normocephalic and atraumatic.  Right Ear: Tympanic membrane normal.  Left Ear: Tympanic membrane normal.  Nose: Rhinorrhea (worse on L than R) present. Left sinus exhibits maxillary sinus tenderness.  Mouth/Throat: Posterior oropharyngeal erythema present.  Nose: Severe septal deviation to the L and friability.   Eyes: Conjunctivae and EOM are normal.  Neck: Neck supple. No tracheal deviation present. No thyromegaly present.  Cardiovascular: Normal rate and regular rhythm.   Murmur heard.  Systolic (R upper sternal border) murmur is present with a grade of 2/6  Pulmonary/Chest: Effort normal. No respiratory distress. She has wheezes (end expiratory, worse in L upper lobe).  Musculoskeletal: Normal range of motion.  Lymphadenopathy:    She has no cervical adenopathy (though tenderness with palpation).  Neurological: She is alert and oriented to person, place, and time.  Skin: Skin is warm and dry.  Scabs on forearm and small fissure on distal aspect of R thumb with callus  Psychiatric: She has a normal mood and affect. Her behavior is normal.  Nursing note and vitals reviewed.  BP (!) 148/78 (BP Location: Right Arm, Patient Position: Sitting, Cuff Size: Small)   Pulse 87   Temp 97.5 F (36.4 C) (Oral)   Ht 5\' 1"  (1.549 m)   Wt 155 lb (70.3 kg)   SpO2 96%   BMI 29.29 kg/m     Assessment & Plan:   1. Acute recurrent sinusitis, unspecified location   2. Dry eye   3. Chronic obstructive pulmonary disease, unspecified COPD type ([place])      Orders Placed This Encounter  Procedures  . Care order/instruction:    AVS printed - let patient go!    Meds ordered this encounter  Medications  . esomeprazole (NEXIUM) 40 MG capsule    Sig: Take 1 capsule (40 mg total) by mouth daily at 12 noon.    Dispense:  30 capsule    Refill:  5  . amoxicillin-clavulanate (AUGMENTIN) 875-125 MG tablet    Sig: Take 1 tablet by mouth 2 (two) times daily.    Dispense:  20 tablet    Refill:  0  . albuterol (PROVENTIL HFA;VENTOLIN HFA) 108 (90 Base) MCG/ACT inhaler    Sig: Inhale 2 puffs into the lungs every 4 (four) hours as needed for wheezing or shortness of breath (cough, shortness of breath or wheezing.).    Dispense:  1 Inhaler    Refill:  11  . predniSONE (DELTASONE) 50  MG tablet    Sig: One by mouth daily for 5 days    Dispense:  5 tablet    Refill:  0  . cycloSPORINE (RESTASIS) 0.05 % ophthalmic emulsion    Sig: Place 1 drop into both eyes 2 (two) times daily.    Dispense:  5.5 mL    Refill:  5    I personally performed the services described in this documentation, which was scribed in my presence. The recorded information has been reviewed and considered, and addended by me as needed.   [name], M.D.  [facility] [address] [place], [place] [address] [phone] phone [phone] fax  04/01/16 5:10 AM

## 2016-03-13 NOTE — Progress Notes (Deleted)
To whom it may concern:  Ms Isabel Collins is a patient of mine and was here for evaluation on 03/13/2016. I am treating her for an illness. She can stand for up to 10 minutes but should otherwise sit. She should spend most of day laying down for the next week until her illness has passed.   If you have any questions or concerns, please don't hesitate to call.   Sincerely,

## 2016-03-15 ENCOUNTER — Telehealth: Payer: Self-pay

## 2016-03-28 ENCOUNTER — Emergency Department (HOSPITAL_COMMUNITY): Payer: Medicare Other

## 2016-03-28 ENCOUNTER — Encounter (HOSPITAL_COMMUNITY): Payer: Self-pay | Admitting: Emergency Medicine

## 2016-03-28 ENCOUNTER — Emergency Department (HOSPITAL_COMMUNITY)
Admission: EM | Admit: 2016-03-28 | Discharge: 2016-03-28 | Disposition: A | Discharge status: Home / Self Care | Payer: Medicare Other | Attending: Emergency Medicine | Admitting: Emergency Medicine

## 2016-03-28 DIAGNOSIS — Z96619 Presence of unspecified artificial shoulder joint: Secondary | ICD-10-CM | POA: Diagnosis not present

## 2016-03-28 DIAGNOSIS — Y939 Activity, unspecified: Secondary | ICD-10-CM | POA: Diagnosis not present

## 2016-03-28 DIAGNOSIS — S0083XA Contusion of other part of head, initial encounter: Secondary | ICD-10-CM | POA: Insufficient documentation

## 2016-03-28 DIAGNOSIS — W06XXXA Fall from bed, initial encounter: Secondary | ICD-10-CM | POA: Diagnosis not present

## 2016-03-28 DIAGNOSIS — Y999 Unspecified external cause status: Secondary | ICD-10-CM | POA: Insufficient documentation

## 2016-03-28 DIAGNOSIS — Z87891 Personal history of nicotine dependence: Secondary | ICD-10-CM | POA: Insufficient documentation

## 2016-03-28 DIAGNOSIS — S0003XA Contusion of scalp, initial encounter: Secondary | ICD-10-CM

## 2016-03-28 DIAGNOSIS — Y929 Unspecified place or not applicable: Secondary | ICD-10-CM | POA: Insufficient documentation

## 2016-03-28 DIAGNOSIS — I10 Essential (primary) hypertension: Secondary | ICD-10-CM | POA: Insufficient documentation

## 2016-03-28 DIAGNOSIS — Z96659 Presence of unspecified artificial knee joint: Secondary | ICD-10-CM | POA: Insufficient documentation

## 2016-03-28 DIAGNOSIS — S0990XA Unspecified injury of head, initial encounter: Secondary | ICD-10-CM | POA: Diagnosis present

## 2016-03-28 DIAGNOSIS — T148XXA Other injury of unspecified body region, initial encounter: Secondary | ICD-10-CM

## 2016-03-28 DIAGNOSIS — S50311A Abrasion of right elbow, initial encounter: Secondary | ICD-10-CM | POA: Insufficient documentation

## 2016-03-28 DIAGNOSIS — W19XXXA Unspecified fall, initial encounter: Secondary | ICD-10-CM

## 2016-03-28 NOTE — ED Provider Notes (Signed)
[facility] DEPT Provider Note   CSN: [id number] Arrival date & time: 03/28/16  0433     History   Chief Complaint Chief Complaint  Patient presents with  . Fall    HPI Isabel Collins is a 76 y.o. female.  HPI  This is a 76 yo female with a history of high cholesterol, hypertension, pneumonia who presents following a fall. Patient reports that she was having a bad dream and she fell out of her bed. She states that she woke up mid fall and subsequently hit her head. She denies loss of consciousness. She reports head pain and elbow pain. She reports chronic left-sided hip pain that worsened after the fall. She was ambulatory after falling. She is not on any blood thinners.  Past Medical History:  Diagnosis Date  . Hiatal hernia   . High cholesterol   . Hypertension   . PNA (pneumonia)   . Thyroid disease     Patient Active Problem List   Diagnosis Date Noted  . Hypertension 02/10/2016  . High cholesterol 02/10/2016  . Hiatal hernia 02/10/2016    Past Surgical History:  Procedure Laterality Date  . arm surgery    . BREAST SURGERY    . REPLACEMENT TOTAL KNEE    . TOTAL SHOULDER REPLACEMENT      OB History    No data available       Home Medications    Prior to Admission medications   Medication Sig Start Date End Date Taking? Authorizing Provider  albuterol (PROVENTIL HFA;VENTOLIN HFA) 108 (90 Base) MCG/ACT inhaler Inhale 2 puffs into the lungs every 4 (four) hours as needed for wheezing or shortness of breath (cough, shortness of breath or wheezing.). 03/13/16   [name], MD  amoxicillin-clavulanate (AUGMENTIN) 875-125 MG tablet Take 1 tablet by mouth 2 (two) times daily. 03/13/16   [name], MD  atorvastatin (LIPITOR) 40 MG tablet Take 40 mg by mouth at bedtime.    Historical Provider, MD  celecoxib (CELEBREX) 200 MG capsule Take 200 mg by mouth 2 (two) times daily.    Historical Provider, MD  cycloSPORINE (RESTASIS) 0.05 % ophthalmic emulsion Place 1  drop into both eyes 2 (two) times daily. 03/13/16   [name], MD  dicyclomine (BENTYL) 10 MG capsule Take 20 mg by mouth 3 (three) times daily as needed for spasms.    Historical Provider, MD  DULoxetine (CYMBALTA) 30 MG capsule Take 30 mg by mouth daily. 01/13/16   Historical Provider, MD  ergocalciferol (VITAMIN D2) 50000 units capsule Take 1 capsule (50,000 Units total) by mouth once a week. 02/10/16   [name], MD  esomeprazole (NEXIUM) 40 MG capsule Take 1 capsule (40 mg total) by mouth daily at 12 noon. 03/13/16   [name], MD  fluticasone (FLONASE) 50 MCG/ACT nasal spray Place 2 sprays into both nostrils at bedtime. 02/10/16   [name], MD  levothyroxine (SYNTHROID, LEVOTHROID) 75 MCG tablet Take 1 tablet (75 mcg total) by mouth daily before breakfast. You need an office visit while still on med for refills 02/12/16   [name], MD  LYRICA 150 MG capsule Take 150 mg by mouth 2 (two) times daily. 01/13/16   Historical Provider, MD  methylcellulose (CITRUCEL) oral powder Take 1 packet by mouth daily. 01/31/16   Billy Fischer, MD  metoprolol succinate (TOPROL-XL) 100 MG 24 hr tablet Take 1 tablet (100 mg total) by mouth every morning. Take with or immediately  following a meal. 02/24/16   [name], MD  Multiple Vitamins-Minerals (MULTIVITAMIN) tablet Take 1 tablet by mouth daily. 02/10/16   [name], MD  polyethylene glycol powder (MIRALAX) powder Take 17 g by mouth daily. 01/31/16   Billy Fischer, MD  predniSONE (DELTASONE) 50 MG tablet One by mouth daily for 5 days 03/13/16   [name], MD  promethazine-phenylephrine (PROMETHAZINE VC PLAIN) 6.25-5 MG/5ML SYRP Take 5 mLs by mouth every 4 (four) hours as needed for congestion. 02/10/16   [name], MD  traZODone (DESYREL) 50 MG tablet Take 50 mg by mouth at bedtime as needed for sleep.    Historical Provider, MD    Family History History reviewed. No pertinent family history.  Social History Social History  Substance Use Topics  .  Smoking status: Former Research scientist (life sciences)  . Smokeless tobacco: Never Used  . Alcohol use No     Allergies   Patient has no known allergies.   Review of Systems Review of Systems  Constitutional: Negative for fever.  Respiratory: Negative for shortness of breath.   Cardiovascular: Negative for chest pain.  Gastrointestinal: Negative for abdominal pain.  Musculoskeletal:       Right elbow pain, left hip pain  Skin: Positive for wound.  Neurological: Positive for headaches. Negative for syncope.  All other systems reviewed and are negative.    Physical Exam Updated Vital Signs BP (!) 161/112 (BP Location: Right Arm)   Pulse 69   Temp 98.9 F (37.2 C) (Oral)   Resp 18   Ht 5\' 1"  (1.549 m)   Wt 155 lb (70.3 kg)   SpO2 98%   BMI 29.29 kg/m   Physical Exam  Constitutional: She is oriented to person, place, and time. She appears well-developed and well-nourished.  Appears younger than stated age  HENT:  Head: Normocephalic.  Hematoma right forehead  Eyes: EOM are normal. Pupils are equal, round, and reactive to light.  Neck: Normal range of motion. Neck supple.  No midline C-spine tenderness to palpation, step-off, deformity  Cardiovascular: Normal rate, regular rhythm and normal heart sounds.   Pulmonary/Chest: Effort normal and breath sounds normal. No respiratory distress. She has no wheezes.  Abdominal: Soft. Bowel sounds are normal. There is no tenderness. There is no guarding.  Musculoskeletal:  Normal range of motion of the right elbow, no obvious deformities, no effusion present, normal range of motion of bilateral hips and knees without step-off or deformities noted  Neurological: She is alert and oriented to person, place, and time.  Skin: Skin is warm and dry.  Small abrasion to the right elbow  Psychiatric: She has a normal mood and affect.  Nursing note and vitals reviewed.    ED Treatments / Results  Labs (all labs ordered are listed, but only abnormal results  are displayed) Labs Reviewed - No data to display  EKG  EKG Interpretation None       Radiology No results found.  Procedures Procedures (including critical care time)  Medications Ordered in ED Medications - No data to display   Initial Impression / Assessment and Plan / ED Course  I have reviewed the triage vital signs and the nursing notes.  Pertinent labs & imaging results that were available during my care of the patient were reviewed by me and considered in my medical decision making (see chart for details).     Patient presents following a fall. Nontoxic. Denies syncope. No other symptoms. She has a  contusion/hematoma to her right for head as well as a small abrasion to her elbow. X-rays and CT obtained. These are reassuring.  After history, exam, and medical workup I feel the patient has been appropriately medically screened and is safe for discharge home. Pertinent diagnoses were discussed with the patient. Patient was given return precautions.   Final Clinical Impressions(s) / ED Diagnoses   Final diagnoses:  Fall    New Prescriptions New Prescriptions   No medications on file     Merryl Hacker, MD 03/28/16 [phone]

## 2016-03-28 NOTE — ED Triage Notes (Signed)
[facility] Patient was in bed and rolled over which resulted in her hitting her head on the floor. Patient has a goose egg on her forehead.  Right elbow has a skin tear.

## 2016-03-28 NOTE — ED Notes (Signed)
Bed: WA06 Expected date:  Expected time:  Means of arrival:  Comments: 

## 2016-03-28 NOTE — Discharge Instructions (Signed)
You were seen today after a fall. The only obvious injury sustained was a contusion to your forehead.  All your imaging tests are reassuring.

## 2016-04-06 ENCOUNTER — Telehealth: Payer: Self-pay | Admitting: Physician Assistant

## 2016-04-06 MED ORDER — LEVOTHYROXINE SODIUM 75 MCG PO TABS: 75.0000 ug | ORAL_TABLET | Freq: Every day | ORAL | 0 refills | Status: DC

## 2016-04-06 MED ORDER — CELECOXIB 200 MG PO CAPS: 200.0000 mg | ORAL_CAPSULE | Freq: Two times a day (BID) | ORAL | 0 refills | Status: DC

## 2016-04-06 NOTE — Telephone Encounter (Signed)
Received call from Nurse [facility] that the patient needed refills. After chart review, realized that the patient needs labs (TSH), but unable to reach the nurse or the patient on call back. Wil lask the pharmacist help [place] communicate this to the patient.

## 2016-04-11 ENCOUNTER — Other Ambulatory Visit: Payer: Self-pay | Admitting: Family Medicine

## 2016-04-20 ENCOUNTER — Emergency Department (HOSPITAL_COMMUNITY): Payer: Medicare Other

## 2016-04-20 ENCOUNTER — Emergency Department (HOSPITAL_COMMUNITY)
Admission: EM | Admit: 2016-04-20 | Discharge: 2016-04-20 | Disposition: A | Discharge status: Home / Self Care | Payer: Medicare Other | Attending: Emergency Medicine | Admitting: Emergency Medicine

## 2016-04-20 ENCOUNTER — Encounter (HOSPITAL_COMMUNITY): Payer: Self-pay | Admitting: Emergency Medicine

## 2016-04-20 DIAGNOSIS — Z79899 Other long term (current) drug therapy: Secondary | ICD-10-CM | POA: Insufficient documentation

## 2016-04-20 DIAGNOSIS — M5441 Lumbago with sciatica, right side: Secondary | ICD-10-CM | POA: Diagnosis not present

## 2016-04-20 DIAGNOSIS — R42 Dizziness and giddiness: Secondary | ICD-10-CM | POA: Diagnosis not present

## 2016-04-20 DIAGNOSIS — M5442 Lumbago with sciatica, left side: Secondary | ICD-10-CM | POA: Insufficient documentation

## 2016-04-20 DIAGNOSIS — G8929 Other chronic pain: Secondary | ICD-10-CM | POA: Insufficient documentation

## 2016-04-20 DIAGNOSIS — Z87891 Personal history of nicotine dependence: Secondary | ICD-10-CM | POA: Insufficient documentation

## 2016-04-20 DIAGNOSIS — Z96659 Presence of unspecified artificial knee joint: Secondary | ICD-10-CM | POA: Insufficient documentation

## 2016-04-20 DIAGNOSIS — Z96619 Presence of unspecified artificial shoulder joint: Secondary | ICD-10-CM | POA: Insufficient documentation

## 2016-04-20 DIAGNOSIS — M545 Low back pain: Secondary | ICD-10-CM | POA: Diagnosis present

## 2016-04-20 DIAGNOSIS — I10 Essential (primary) hypertension: Secondary | ICD-10-CM | POA: Insufficient documentation

## 2016-04-20 HISTORY — DX: Fibromyalgia: M79.7

## 2016-04-20 LAB — CBC WITH DIFFERENTIAL/PLATELET
BASOS PCT: 0 %
Basophils Absolute: 0 10*3/uL (ref 0.0–0.1)
EOS ABS: 0.3 10*3/uL (ref 0.0–0.7)
Eosinophils Relative: 2 %
HEMATOCRIT: 42.3 % (ref 36.0–46.0)
HEMOGLOBIN: 13.5 g/dL (ref 12.0–15.0)
Lymphocytes Relative: 25 %
Lymphs Abs: 2.6 10*3/uL (ref 0.7–4.0)
MCH: 26.4 pg (ref 26.0–34.0)
MCHC: 31.9 g/dL (ref 30.0–36.0)
MCV: 82.6 fL (ref 78.0–100.0)
MONOS PCT: 9 %
Monocytes Absolute: 0.9 10*3/uL (ref 0.1–1.0)
Neutro Abs: 6.6 10*3/uL (ref 1.7–7.7)
Neutrophils Relative %: 64 %
Platelets: 237 10*3/uL (ref 150–400)
RBC: 5.12 MIL/uL — ABNORMAL HIGH (ref 3.87–5.11)
RDW: 14.8 % (ref 11.5–15.5)
WBC: 10.3 10*3/uL (ref 4.0–10.5)

## 2016-04-20 LAB — BASIC METABOLIC PANEL
Anion gap: 9 (ref 5–15)
BUN: 9 mg/dL (ref 6–20)
CALCIUM: 9.5 mg/dL (ref 8.9–10.3)
CHLORIDE: 104 mmol/L (ref 101–111)
CO2: 27 mmol/L (ref 22–32)
CREATININE: 0.72 mg/dL (ref 0.44–1.00)
GFR calc Af Amer: >60 mL/min (ref >60)
GFR calc non Af Amer: >60 mL/min (ref >60)
GLUCOSE: 81 mg/dL (ref 65–99)
Potassium: 4.1 mmol/L (ref 3.5–5.1)
Sodium: 140 mmol/L (ref 135–145)

## 2016-04-20 LAB — URINALYSIS, ROUTINE W REFLEX MICROSCOPIC
BILIRUBIN URINE: NEGATIVE
Glucose, UA: NEGATIVE mg/dL
Ketones, ur: NEGATIVE mg/dL
Leukocytes, UA: NEGATIVE
NITRITE: NEGATIVE
PH: 8 (ref 5.0–8.0)
Protein, ur: NEGATIVE mg/dL
SPECIFIC GRAVITY, URINE: 1.001 — AB (ref 1.005–1.030)
Squamous Epithelial / LPF: NONE SEEN

## 2016-04-20 MED ORDER — ACETAMINOPHEN 325 MG PO TABS
650.0000 mg | ORAL_TABLET | Freq: Once | ORAL | Status: AC
  Administered 2016-04-20: 650 mg via ORAL
  Filled 2016-04-20: qty 2

## 2016-04-20 MED ORDER — LIDO-CAPSAICIN-MEN-METHYL SAL 0.5-0.035-5-20 % EX PTCH: 1.0000 | MEDICATED_PATCH | Freq: Once | CUTANEOUS | 0 refills | Status: AC

## 2016-04-20 NOTE — Congregational Nurse Program (Signed)
Congregational Nurse Program Note  Date of Encounter: 03/12/2016  Past Medical History: Past Medical History:  Diagnosis Date  . Arthritis   . Fibromyalgia   . Hiatal hernia   . High cholesterol   . History of degenerative disc disease   . Hypertension   . PNA (pneumonia)   . Thyroid disease     Encounter Details:  Follow up after PCP visit

## 2016-04-20 NOTE — ED Provider Notes (Signed)
[place] DEPT Provider Note   CSN: [id number] Arrival date & time: 04/20/16  1230     History   Chief Complaint Chief Complaint  Patient presents with  . Back Pain    HPI Isabel Collins is a 76 y.o. female.  76 year old Caucasian female past medical history significant for thyroid disease, hypertension, degenerative disc disease that presents to the ED today with complaints of lower back pain that has been ongoing for 2 years. Patient states that approximately 20 years ago she was getting injections into her back for same. She has not followed up with anybody in the past few years concerning her back. States that walking and standing makes the pain worse. Lying makes the pain better. She has tried Motrin at home with little relief. She feels like her spine is leaning to the right. The pain radiates down both buttocks into her right legs bilaterally. Patient denies any saddle paresthesias, urinary retention, loss of bowel or bladder, lower extremity paresthesias. Patient currently lives at homeless shelter and EMS was called after shelter would not let patient lie down early due to her pain. States that when she is on her feet for more than 10 minutes the pain increases. Patient is able to ambulate with normal gait without any assistance. Secondly patient complains of associated balance issues and feeling of the room is spinning if she stands up for longer than 10 minutes. She denies any lightheadedness, syncope, double vision. This has been intermittent for the past year. States that when she lies down this makes her feel better. Patient denies any headache, vision changes, light headedness, chest pain, shortness of breath, abdominal pain, nausea, emesis, urinary symptoms, change in bowel habits, paresthesias. Patient denies any history of IV drug use or history of cancer. Denies any night sweats.      Past Medical History:  Diagnosis Date  . Arthritis   . Fibromyalgia   . Hiatal  hernia   . High cholesterol   . History of degenerative disc disease   . Hypertension   . PNA (pneumonia)   . Thyroid disease     Patient Active Problem List   Diagnosis Date Noted  . Hypertension 02/10/2016  . High cholesterol 02/10/2016  . Hiatal hernia 02/10/2016    Past Surgical History:  Procedure Laterality Date  . arm surgery    . BREAST SURGERY    . REPLACEMENT TOTAL KNEE    . TOTAL SHOULDER REPLACEMENT      OB History    No data available       Home Medications    Prior to Admission medications   Medication Sig Start Date End Date Taking? Authorizing Provider  albuterol (PROVENTIL HFA;VENTOLIN HFA) 108 (90 Base) MCG/ACT inhaler Inhale 2 puffs into the lungs every 4 (four) hours as needed for wheezing or shortness of breath (cough, shortness of breath or wheezing.). 03/13/16   [name], MD  amoxicillin-clavulanate (AUGMENTIN) 875-125 MG tablet Take 1 tablet by mouth 2 (two) times daily. 03/13/16   [name], MD  atorvastatin (LIPITOR) 40 MG tablet Take 40 mg by mouth at bedtime.    Historical Provider, MD  celecoxib (CELEBREX) 200 MG capsule Take 1 capsule (200 mg total) by mouth 2 (two) times daily. 04/06/16   Chelle Jeffery, PA-C  cycloSPORINE (RESTASIS) 0.05 % ophthalmic emulsion Place 1 drop into both eyes 2 (two) times daily. 03/13/16   [name], MD  dicyclomine (BENTYL) 10 MG capsule Take 20 mg by mouth  3 (three) times daily as needed for spasms.    Historical Provider, MD  DULoxetine (CYMBALTA) 30 MG capsule Take 30 mg by mouth daily. 01/13/16   Historical Provider, MD  ergocalciferol (VITAMIN D2) 50000 units capsule Take 1 capsule (50,000 Units total) by mouth once a week. 02/10/16   [name], MD  esomeprazole (NEXIUM) 40 MG capsule Take 1 capsule (40 mg total) by mouth daily at 12 noon. 03/13/16   [name], MD  fluticasone (FLONASE) 50 MCG/ACT nasal spray Place 2 sprays into both nostrils at bedtime. 02/10/16   [name], MD  levothyroxine (SYNTHROID,  LEVOTHROID) 75 MCG tablet Take 1 tablet (75 mcg total) by mouth daily before breakfast. You need an office visit while still on med for refills 04/06/16   Chelle Jeffery, PA-C  LYRICA 150 MG capsule Take 150 mg by mouth 2 (two) times daily. 01/13/16   Historical Provider, MD  methylcellulose (CITRUCEL) oral powder Take 1 packet by mouth daily. 01/31/16   Billy Fischer, MD  metoprolol succinate (TOPROL-XL) 100 MG 24 hr tablet Take 1 tablet by mouth every morning. Take with or immediately following a meal. 04/12/16   Tereasa Coop, PA-C  Multiple Vitamins-Minerals (MULTIVITAMIN) tablet Take 1 tablet by mouth daily. 02/10/16   [name], MD  polyethylene glycol powder (MIRALAX) powder Take 17 g by mouth daily. 01/31/16   Billy Fischer, MD  predniSONE (DELTASONE) 50 MG tablet One by mouth daily for 5 days 03/13/16   [name], MD  promethazine-phenylephrine (PROMETHAZINE VC PLAIN) 6.25-5 MG/5ML SYRP Take 5 mLs by mouth every 4 (four) hours as needed for congestion. 02/10/16   [name], MD  traZODone (DESYREL) 50 MG tablet Take 50 mg by mouth at bedtime as needed for sleep.    Historical Provider, MD    Family History History reviewed. No pertinent family history.  Social History Social History  Substance Use Topics  . Smoking status: Former Research scientist (life sciences)  . Smokeless tobacco: Never Used  . Alcohol use No     Allergies   Patient has no known allergies.   Review of Systems Review of Systems  Constitutional: Negative for chills and fever.  HENT: Negative for congestion.   Eyes: Negative for photophobia and visual disturbance.  Respiratory: Negative for cough and shortness of breath.   Cardiovascular: Negative for chest pain, palpitations and leg swelling.  Gastrointestinal: Negative for abdominal pain, diarrhea, nausea and vomiting.  Genitourinary: Negative for dysuria, flank pain, hematuria and urgency.  Musculoskeletal: Positive for arthralgias, back pain and gait problem. Negative for neck  pain and neck stiffness.  Skin: Negative.   Neurological: Positive for dizziness (room spinning sensation). Negative for syncope, weakness, light-headedness, numbness and headaches.  All other systems reviewed and are negative.    Physical Exam Updated Vital Signs BP 159/83   Pulse 72   Temp 97.7 F (36.5 C) (Oral)   Resp 16   SpO2 98%   Physical Exam  Constitutional: She is oriented to person, place, and time. She appears well-developed and well-nourished. No distress.  Non toxic elderly women in nad.  HENT:  Head: Normocephalic and atraumatic.  Mouth/Throat: Oropharynx is clear and moist.  Eyes: Conjunctivae and EOM are normal. Pupils are equal, round, and reactive to light. Right eye exhibits no discharge. Left eye exhibits no discharge. No scleral icterus.  Neck: Normal range of motion. Neck supple. No thyromegaly present.  Cardiovascular: Normal rate, regular rhythm, normal heart sounds  and intact distal pulses.  Exam reveals no gallop and no friction rub.   No murmur heard. Pulmonary/Chest: Effort normal and breath sounds normal. No respiratory distress. She has no wheezes.  Abdominal: Soft. Bowel sounds are normal. She exhibits no distension. There is no tenderness. There is no rebound and no guarding.  Musculoskeletal: Normal range of motion.  Midline L-spine tenderness. No deformities or step-offs noted. Scoliosis of the lumbar spine is noted. Positive straight leg raise test on the left and right that reproduces pain down her legs without any paresthesias. Sensation intact to sharp/dull. Strength 5 out of 5 in lower Shoney's. Cap refill normal. DP pulses are 2+ bilaterally.  Lymphadenopathy:    She has no cervical adenopathy.  Neurological: She is alert and oriented to person, place, and time.  The patient is alert, attentive, and oriented x 3. Speech is clear. Cranial nerve II-VII grossly intact. Negative pronator drift. Sensation intact. Strength 5/5 in all extremities.  Reflexes 2+ and symmetric at biceps, triceps, knees, and ankles. Rapid alternating movement and fine finger movements intact. Romberg is absent. Posture and gait normal.  Patient ambulated to bathroom with normal gait without cane. No nystagmus.  Skin: Skin is warm and dry. Capillary refill takes less than 2 seconds.  Nursing note and vitals reviewed.    ED Treatments / Results  Labs (all labs ordered are listed, but only abnormal results are displayed) Labs Reviewed  CBC WITH DIFFERENTIAL/PLATELET - Abnormal; Notable for the following:       Result Value   RBC 5.12 (*)    All other components within normal limits  URINALYSIS, ROUTINE W REFLEX MICROSCOPIC - Abnormal; Notable for the following:    Color, Urine COLORLESS (*)    Specific Gravity, Urine 1.001 (*)    Hgb urine dipstick LARGE (*)    Bacteria, UA RARE (*)    All other components within normal limits  BASIC METABOLIC PANEL    EKG  EKG Interpretation None       Radiology No results found.  Procedures Procedures (including critical care time)  Medications Ordered in ED Medications  acetaminophen (TYLENOL) tablet 650 mg (650 mg Oral Given 04/20/16 1610)     Initial Impression / Assessment and Plan / ED Course  I have reviewed the triage vital signs and the nursing notes.  Pertinent labs & imaging results that were available during my care of the patient were reviewed by me and considered in my medical decision making (see chart for details).     Pt presents to the ED with complaint of chronic back pain and vertigo like symptoms. Patient with back pain.  No neurological deficits and normal neuro exam.  Patient is able to ambulate with normal gait. Xray of lumbar spine show sign degenerative disease and scolios.  No loss of bowel or bladder control.  No concern for cauda equina.  No fever, night sweats, weight loss, h/o cancer, IVDU.  Pt with normal neuro exam. Pt denies any dizziness at this time. Vertigo like  symptoms are intermittent for the past 1 year. Denies any vision changes or ha. Feel that this can be address by PCP in the outpatient setting. Do not feel that CT is warranted given no symptoms now and ongoing for 1 year. UA shows no signs of infection. Hgb present in urine. Denies any urinary symptoms. Encouraged to follow up with PCP to have ua rechecked to make sure hgb resolves. RICE protocol and pain medicine indicated and discussed with  patient. Pt is hemodynamically stable, in NAD, & able to ambulate in the ED. Pain has been managed & has no complaints prior to dc. Pt is comfortable with above plan and is stable for discharge at this time. All questions were answered prior to disposition. Strict return precautions for f/u to the ED were discussed. Pt was dicussed and examined by Dr. Ralene Bathe who is agreeable to the above plan.    Final Clinical Impressions(s) / ED Diagnoses   Final diagnoses:  Chronic bilateral low back pain with bilateral sciatica  Dizziness    New Prescriptions New Prescriptions   No medications on file     Doristine Devoid, PA-C 04/23/16 1615    Quintella Reichert, MD 05/07/16 [phone]

## 2016-04-20 NOTE — ED Triage Notes (Addendum)
Per [facility]. Pt reports back pain for the past 2 years. Used to get shots in back for this, but has not gotten any recently. Hx of degenerative disc. Pt currently living at a homeless shelter, and EMS called after shelter would not let pt lie down early. Pt ambulatory, normally walks with cane. Pt also complains that she gets dizzy (room spinning) when standing for over 10 minutes. None at present

## 2016-04-20 NOTE — Discharge Instructions (Signed)
Motrin and Tylenol for pain. You need to follow up with primary care doctor for these chronic issues. Heating pad to lower back. May apply lidocaine patch to lower back for back pain as needed.

## 2016-05-04 ENCOUNTER — Encounter: Payer: Self-pay | Admitting: Pediatric Intensive Care

## 2016-05-04 NOTE — Congregational Nurse Program (Signed)
Congregational Nurse Program Note  Date of Encounter: 05/04/2016  Past Medical History: Past Medical History:  Diagnosis Date  . Arthritis   . Fibromyalgia   . Hiatal hernia   . High cholesterol   . History of degenerative disc disease   . Hypertension   . PNA (pneumonia)   . Thyroid disease     Encounter Details:     CNP Questionnaire - 05/04/16 1000      Patient Demographics   Is this a new or existing patient? Existing   Patient is considered a/an Not Applicable   Race Caucasian/White     Patient Assistance   Location of Patient Assistance [place]   Patient's financial/insurance status Medicaid;Medicare   Uninsured Patient ([facility]) No   Patient referred to apply for the following financial assistance Not Applicable   Food insecurities addressed Not Applicable   Transportation assistance No   Assistance securing medications No   Educational health offerings Acute disease     Encounter Details   Primary purpose of visit Acute Illness/Condition Visit   Was an Emergency Department visit averted? Yes   Does patient have a medical provider? Yes   Patient referred to Follow up with established PCP   Was a mental health screening completed? (GAINS tool) No   Does patient have dental issues? No   Does patient have vision issues? No   Does your patient have an abnormal blood pressure today? No   Since previous encounter, have you referred patient for abnormal blood pressure that resulted in a new diagnosis or medication change? No   Does your patient have an abnormal blood glucose today? No   Since previous encounter, have you referred patient for abnormal blood glucose that resulted in a new diagnosis or medication change? No   Was there a life-saving intervention made? No     Client states several day history of URI symptoms- primarily sinus drainage and cough. Denies shortness of breath. Faint expiratory wheeze noted bilaterally with generally only fair  air movement. Client will see PCP tomorrow. Appointment made. Advised to go to ED if any difficulty breathing before appointment.

## 2016-05-05 ENCOUNTER — Ambulatory Visit (INDEPENDENT_AMBULATORY_CARE_PROVIDER_SITE_OTHER): Payer: Medicare Other | Admitting: Family Medicine

## 2016-05-05 VITALS — BP 120/70 | HR 80 | Temp 98.4°F | Resp 17 | Ht 61.0 in | Wt 158.4 lb

## 2016-05-05 DIAGNOSIS — J069 Acute upper respiratory infection, unspecified: Secondary | ICD-10-CM | POA: Diagnosis not present

## 2016-05-05 DIAGNOSIS — Z87891 Personal history of nicotine dependence: Secondary | ICD-10-CM

## 2016-05-05 DIAGNOSIS — R319 Hematuria, unspecified: Secondary | ICD-10-CM | POA: Diagnosis not present

## 2016-05-05 DIAGNOSIS — M4696 Unspecified inflammatory spondylopathy, lumbar region: Secondary | ICD-10-CM

## 2016-05-05 DIAGNOSIS — E039 Hypothyroidism, unspecified: Secondary | ICD-10-CM | POA: Diagnosis not present

## 2016-05-05 DIAGNOSIS — J42 Unspecified chronic bronchitis: Secondary | ICD-10-CM

## 2016-05-05 DIAGNOSIS — I10 Essential (primary) hypertension: Secondary | ICD-10-CM | POA: Diagnosis not present

## 2016-05-05 DIAGNOSIS — M160 Bilateral primary osteoarthritis of hip: Secondary | ICD-10-CM

## 2016-05-05 DIAGNOSIS — Z5181 Encounter for therapeutic drug level monitoring: Secondary | ICD-10-CM

## 2016-05-05 DIAGNOSIS — M47816 Spondylosis without myelopathy or radiculopathy, lumbar region: Secondary | ICD-10-CM

## 2016-05-05 LAB — POCT URINALYSIS DIP (MANUAL ENTRY)
Bilirubin, UA: NEGATIVE
Glucose, UA: NEGATIVE
Ketones, POC UA: NEGATIVE
NITRITE UA: NEGATIVE
PH UA: 6
Spec Grav, UA: 1.015
UROBILINOGEN UA: 0.2

## 2016-05-05 LAB — POCT CBC
Granulocyte percent: 71.2 %G (ref 37–80)
HEMATOCRIT: 36.6 % — AB (ref 37.7–47.9)
Hemoglobin: 12.7 g/dL (ref 12.2–16.2)
LYMPH, POC: 2 (ref 0.6–3.4)
MCH, POC: 27.9 pg (ref 27–31.2)
MCHC: 34.6 g/dL (ref 31.8–35.4)
MCV: 80.8 fL (ref 80–97)
MID (cbc): 0.5 (ref 0–0.9)
MPV: 7.9 fL (ref 0–99.8)
PLATELET COUNT, POC: 240 10*3/uL (ref 142–424)
POC Granulocyte: 6.3 (ref 2–6.9)
POC LYMPH %: 22.8 % (ref 10–50)
POC MID %: 6 %M (ref 0–12)
RBC: 4.53 M/uL (ref 4.04–5.48)
RDW, POC: 15.8 %
WBC: 8.9 10*3/uL (ref 4.6–10.2)

## 2016-05-05 LAB — POC MICROSCOPIC URINALYSIS (UMFC): MUCUS RE: ABSENT

## 2016-05-05 MED ORDER — MONTELUKAST SODIUM 10 MG PO TABS: 10.0000 mg | ORAL_TABLET | Freq: Every day | ORAL | 2 refills | Status: DC

## 2016-05-05 MED ORDER — LORATADINE 10 MG PO TABS: 10.0000 mg | ORAL_TABLET | Freq: Every day | ORAL | 2 refills | Status: DC

## 2016-05-05 MED ORDER — METHYLPREDNISOLONE ACETATE 80 MG/ML IJ SUSP
80.0000 mg | Freq: Once | INTRAMUSCULAR | Status: AC
  Administered 2016-05-05: 80 mg via INTRAMUSCULAR

## 2016-05-05 MED ORDER — ALBUTEROL SULFATE HFA 108 (90 BASE) MCG/ACT IN AERS: 2.0000 | INHALATION_SPRAY | RESPIRATORY_TRACT | 11 refills | Status: DC | PRN

## 2016-05-05 MED ORDER — IPRATROPIUM BROMIDE 0.02 % IN SOLN
0.5000 mg | Freq: Once | RESPIRATORY_TRACT | Status: AC
  Administered 2016-05-05: 0.5 mg via RESPIRATORY_TRACT

## 2016-05-05 MED ORDER — BENZONATATE 100 MG PO CAPS
100.0000 mg | ORAL_CAPSULE | Freq: Three times a day (TID) | ORAL | 0 refills | Status: DC | PRN
Start: 2016-05-05 — End: 2016-06-04

## 2016-05-05 MED ORDER — ALBUTEROL SULFATE (2.5 MG/3ML) 0.083% IN NEBU
2.5000 mg | INHALATION_SOLUTION | Freq: Once | RESPIRATORY_TRACT | Status: AC
  Administered 2016-05-05: 2.5 mg via RESPIRATORY_TRACT

## 2016-05-05 MED ORDER — FLUTICASONE-SALMETEROL 250-50 MCG/DOSE IN AEPB: 1.0000 | INHALATION_SPRAY | Freq: Two times a day (BID) | RESPIRATORY_TRACT | 1 refills | Status: DC

## 2016-05-05 MED ORDER — CHLORHEXIDINE GLUCONATE 0.12 % MT SOLN: 15.0000 mL | Freq: Two times a day (BID) | OROMUCOSAL | 0 refills | Status: DC

## 2016-05-05 NOTE — Patient Instructions (Addendum)
Do not use the celebrex with any other otc pain medication other than tylenol/acetaminophen - so no aleve, ibuprofen, motrin, advil, etc.     IF you received an x-ray today, you will receive an invoice from [facility]. Please contact [facility] at [phone] with questions or concerns regarding your invoice.   IF you received labwork today, you will receive an invoice from [facility]. Please contact [facility] at [phone] with questions or concerns regarding your invoice.   Our billing staff will not be able to assist you with questions regarding bills from these companies.  You will be contacted with the lab results as soon as they are available. The fastest way to get your results is to activate your My Chart account. Instructions are located on the last page of this paperwork. If you have not heard from [place] regarding the results in 2 weeks, please contact this office.      Chronic Bronchitis Chronic bronchitis is a lasting inflammation of the bronchial tubes, which are the tubes that carry air into your lungs. This is inflammation that occurs:  On most days of the week.  For at least three months at a time.  Over a period of two years in a row. When the bronchial tubes are inflamed, they start to produce mucus. The inflammation and buildup of mucus make it more difficult to breathe. Chronic bronchitis is usually a permanent problem and is one type of chronic obstructive pulmonary disease (COPD). People with chronic bronchitis are at greater risk for getting repeated colds, or respiratory infections. What are the causes? Chronic bronchitis most often occurs in people who have:  Long-standing, severe asthma.  A history of smoking.  Asthma and who also smoke. What are the signs or symptoms? Chronic bronchitis may cause the following:  A cough that brings up mucus (productive cough).  Shortness of breath.  Early morning headache.  Wheezing.  Chest  discomfort.  Recurring respiratory infections. How is this diagnosed? Your health care provider may confirm the diagnosis by:  Taking your medical history.  Performing a physical exam.  Taking a chest X-ray.  Performing pulmonary function tests. How is this treated? Treatment involves controlling symptoms with medicines, oxygen therapy, or making lifestyle changes, such as exercising and eating a healthy, well-balanced diet. Medicines could include:  Inhalers to improve air flow in and out of your lungs.  Antibiotics to treat bacterial infections, such as pneumonia, sinus infections, and acute bronchitis. As a preventative measure, your health care provider may recommend routine vaccinations for influenza and pneumonia. This is to prevent infection and hospitalization since you may be more at risk for these types of infections. Follow these instructions at home:  Take medicines only as directed by your health care provider.  If you smoke cigarettes, chew tobacco, or use electronic cigarettes, quit. If you need help quitting, ask your health care provider.  Avoid pollen, dust, animal dander, molds, smoke, and other things that cause shortness of breath or wheezing attacks.  Talk to your health care provider about possible exercise routines. Regular exercise is very important to help you feel better.  If you are prescribed oxygen use at home follow these guidelines:  Never smoke while using oxygen. Oxygen does not burn or explode, but flammable materials will burn faster in the presence of oxygen.  Keep a Data processing manager close by. Let your fire department know that you have oxygen in your home.  Warn visitors not to smoke near you when you are using  oxygen. Put up "no smoking" signs in your home where you most often use the oxygen.  Regularly test your smoke detectors at home to make sure they work. If you receive care in your home from a nurse or other health care provider, he  or she may also check to make sure your smoke detectors work.  Ask your health care provider whether you would benefit from a pulmonary rehabilitation program.  Do not wait to get medical care if you have any concerning symptoms. Delays could cause permanent injury and may be life threatening. Contact a health care provider if:  You have increased coughing or shortness of breath or both.  You have muscle aches.  You have chest pain.  Your mucus gets thicker.  Your mucus changes from clear or white to yellow, green, gray, or bloody. Get help right away if:  Your usual medicines do not stop your wheezing.  You have increased difficulty breathing.  You have any problems with the medicine you are taking, such as a rash, itching, swelling, or trouble breathing. This information is not intended to replace advice given to you by your health care provider. Make sure you discuss any questions you have with your health care provider. Document Released: 12/07/2005 Document Revised: 06/30/2015 Document Reviewed: 03/30/2013 Elsevier Interactive Patient Education  2017 [employer] Trochanteric Bursitis Trochanteric bursitis is a condition that causes hip pain. Trochanteric bursitis happens when fluid-filled sacs (bursae) in the hip get irritated. Normally these sacs absorb shock and help strong bands of tissue (tendons) in your hip glide smoothly over each other and over your hip bones. What are the causes? This condition results from increased friction between the hip bones and the tendons that go over them. This condition can happen if you:  Have weak hips.  Use your hip muscles too much (overuse).  Get hit in the hip. What increases the risk? This condition is more likely to develop in:  Women.  Adults who are middle-aged or older.  People with arthritis or a spinal condition.  People with weak buttocks muscles (gluteal muscles).  People who have one leg that is shorter than  the other.  People who participate in certain kinds of athletic activities, such as:  Running sports, especially long-distance running.  Contact sports, like football or martial arts.  Sports in which falls may occur, like skiing. What are the signs or symptoms? The main symptom of this condition is pain and tenderness over the point of your hip. The pain may be:  Sharp and intense.  Dull and achy.  Felt on the outside of your thigh. It may increase when you:  Lie on your side.  Walk or run.  Go up on stairs.  Sit.  Stand up after sitting.  Stand for long periods of time. How is this diagnosed? This condition may be diagnosed based on:  Your symptoms.  Your medical history.  A physical exam.  Imaging tests, such as:  X-rays to check your bones.  An MRI or ultrasound to check your tendons and muscles. During your physical exam, your health care provider will check the movement and strength of your hip. He or she may press on the point of your hip to check for pain. How is this treated? This condition may be treated by:  Resting.  Reducing your activity.  Avoiding activities that cause pain.  Using crutches, a cane, or a walker to decrease the strain on your hip.  Taking medicine to help  with swelling.  Having medicine injected into the bursae to help with swelling.  Using ice, heat, and massage therapy for pain relief.  Physical therapy exercises for strength and flexibility.  Surgery (rare). Follow these instructions at home: Activity   Rest.  Avoid activities that cause pain.  Return to your normal activities as told by your health care provider. Ask your health care provider what activities are safe for you. Managing pain, stiffness, and swelling   Take over-the-counter and prescription medicines only as told by your health care provider.  If directed, apply heat to the injured area as told by your health care provider.  Place a towel  between your skin and the heat source.  Leave the heat on for 20-30 minutes.  Remove the heat if your skin turns bright red. This is especially important if you are unable to feel pain, heat, or cold. You may have a greater risk of getting burned.  If directed, apply ice to the injured area:  Put ice in a plastic bag.  Place a towel between your skin and the bag.  Leave the ice on for 20 minutes, 2-3 times a day. General instructions   If the affected leg is one that you use for driving, ask your health care provider when it is safe to drive.  Use crutches, a cane, or a walker as told by your health care provider.  If one of your legs is shorter than the other, get fitted for a shoe insert.  Lose weight if you are overweight. How is this prevented?  Wear supportive footwear that is appropriate for your sport.  If you have hip pain, start any new exercise or sport slowly.  Maintain physical fitness, including:  Strength.  Flexibility. Contact a health care provider if:  Your pain does not improve with 2-4 weeks. Get help right away if:  You develop severe pain.  You have a fever.  You develop increased redness over your hip.  You have a change in your bowel function or bladder function.  You cannot control the muscles in your feet. This information is not intended to replace advice given to you by your health care provider. Make sure you discuss any questions you have with your health care provider. Document Released: 03/29/2004 Document Revised: 10/26/2015 Document Reviewed: 02/04/2015 Elsevier Interactive Patient Education  2017 [employer].

## 2016-05-05 NOTE — Progress Notes (Signed)
By signing my name below, I, [name], attest that this documentation has been prepared under the direction and in the presence of [name], MD.  Electronically Signed: [name], Medical Scribe. 05/05/16. 1:53 PM.  Subjective:    Patient ID: Isabel Collins, female    DOB: March 08, 1940, 76 y.o.   MRN: [id number]  HPI Chief Complaint  Patient presents with  . Cough    X 1 week  . Wheezing  . Hoarse    HPI Comments: Isabel Collins is a 76 y.o. female who presents to the [facility] complaining of productive cough with yellow sputum, wheezing, and voice change onset a week. She was treated with doxycycline in December and augmentin in January.  Sxs started with coughing and it developed into change of voice, fatigue, sore throat, nasal congestion, and wheezing. Pt has been using her inhaler at night and in the morning around 2-3 am, flonase last night, and she has also been using ibuprofen for relief of her sxs. After she used her inhaler this morning she still heard some "crackling". Pt has been using celebrex every other day for her joint pain, sometimes on the day she takes ibuprofen. Pt had expose to second hand smoke during childhood, and has been smoking 0.5 - 1 ppd for 47 years since she was 76 y/o and stopped stopped smoking 12 years ago in 2006. Pt just started needed assistance breathing since moving into the shelter. Denies PMHx of asthma, but suspects she has COPD.  Fall: Reports sharp right temporal sharp pains Pt fell out of her bed and hit her head. She was left with a knot on her head and later on she ended up having a black eye.  Lower back Pain: Left hip x-ray, 03/28/16, showed moderate and advance arthritis in her hips and low back respectively. Lumbar spine x-ray, 04/20/16 showed multilevel degenerative changes. Reports tightness in her left hip and lower back with associated sxs of weakness in her legs. She notes feeling a tightness that  prevents her from ambulating, and when she can it causes her to "stagger".  Dental Problems: Pt suspects she has inflammation in her mouth from her teeth breaking off after falling. Pt occasionally gets a bad taste in her mouth and she brushes the remaining teeth in her mouth. She does not have mouth wash. Reports riding the bus "shakes her to pieces".   Pt is not fasting. Pt plans on looking for apartments in 3 days next Tuesday.  Patient Active Problem List   Diagnosis Date Noted  . Hypertension 02/10/2016  . High cholesterol 02/10/2016  . Hiatal hernia 02/10/2016   Past Medical History:  Diagnosis Date  . Arthritis   . Fibromyalgia   . Hiatal hernia   . High cholesterol   . History of degenerative disc disease   . Hypertension   . PNA (pneumonia)   . Thyroid disease    Past Surgical History:  Procedure Laterality Date  . arm surgery    . BREAST SURGERY    . REPLACEMENT TOTAL KNEE    . TOTAL SHOULDER REPLACEMENT     No Known Allergies Prior to Admission medications   Medication Sig Start Date End Date Taking? Authorizing Provider  albuterol (PROVENTIL HFA;VENTOLIN HFA) 108 (90 Base) MCG/ACT inhaler Inhale 2 puffs into the lungs every 4 (four) hours as needed for wheezing or shortness of breath (and/or cough).   Yes Historical Provider, MD  atorvastatin (LIPITOR) 40 MG tablet Take  40 mg by mouth every evening.    Yes Historical Provider, MD  celecoxib (CELEBREX) 200 MG capsule Take 1 capsule (200 mg total) by mouth 2 (two) times daily. 04/06/16  Yes Chelle Jeffery, PA-C  cycloSPORINE (RESTASIS) 0.05 % ophthalmic emulsion Place 1 drop into both eyes 2 (two) times daily. 03/13/16  Yes [name], MD  DULoxetine (CYMBALTA) 30 MG capsule Take 30-60 mg by mouth 2 (two) times daily. Pt takes two capsules in the morning and one capsule in the afternoon.   Yes Historical Provider, MD  fluticasone (FLONASE) 50 MCG/ACT nasal spray Place 1-2 sprays into both nostrils daily as needed for  rhinitis.   Yes Historical Provider, MD  ibuprofen (ADVIL,MOTRIN) 200 MG tablet Take 400 mg by mouth every 6 (six) hours as needed for headache, mild pain or moderate pain.   Yes Historical Provider, MD  levothyroxine (SYNTHROID, LEVOTHROID) 75 MCG tablet Take 75 mcg by mouth daily before breakfast.   Yes Historical Provider, MD  metoprolol succinate (TOPROL-XL) 100 MG 24 hr tablet Take 100 mg by mouth daily. Take with or immediately following a meal.   Yes Historical Provider, MD  pregabalin (LYRICA) 150 MG capsule Take 150 mg by mouth 2 (two) times daily.   Yes Historical Provider, MD  traZODone (DESYREL) 50 MG tablet Take 50 mg by mouth at bedtime as needed for sleep.   Yes Historical Provider, MD   Social History   Social History  . Marital status: Divorced    Spouse name: N/A  . Number of children: N/A  . Years of education: N/A   Occupational History  . Not on file.   Social History Main Topics  . Smoking status: Former Research scientist (life sciences)  . Smokeless tobacco: Never Used  . Alcohol use No  . Drug use: No  . Sexual activity: Not on file   Other Topics Concern  . Not on file   Social History Narrative  . No narrative on file   Review of Systems  Constitutional: Positive for fatigue.  HENT: Positive for congestion, dental problem, sinus pain, sore throat and voice change.   Respiratory: Positive for cough and wheezing.   Musculoskeletal: Positive for back pain, gait problem and myalgias.  Neurological: Positive for weakness and headaches.   Objective:  Physical Exam  Constitutional: She appears well-developed and well-nourished. No distress.  HENT:  Head: Normocephalic and atraumatic.  Right Ear: A middle ear effusion is present.  Left Ear: A middle ear effusion is present.  Mouth/Throat: Posterior oropharyngeal erythema present. No oropharyngeal exudate.  Nares positive with rhinitis  Eyes: Conjunctivae are normal.  Neck: Neck supple.  Cardiovascular: Normal rate.  Exam  reveals no gallop and no friction rub.   Murmur heard.  Systolic (right upper sternal, ejection) murmur is present with a grade of 2/6  Pulmonary/Chest: Effort normal. No respiratory distress. She has decreased breath sounds in the right lower field and the left lower field. She has no wheezes. She has no rhonchi. She has no rales.  Decreased air movement at the bases  Musculoskeletal:  Tender over her sacrum bilateral SI joint and left lateral greater trochanter  Neurological: She is alert.  Skin: Skin is warm and dry.  Psychiatric: She has a normal mood and affect. Her behavior is normal.  Nursing note and vitals reviewed.  BP 120/70   Pulse 80   Temp 98.4 F (36.9 C) (Oral)   Resp 17   Ht 5\' 1"  (1.549 m)   Wt  158 lb 6 oz (71.8 kg)   SpO2 94%   BMI 29.92 kg/m    Results for orders placed or performed in visit on 05/05/16  POCT CBC  Result Value Ref Range   WBC 8.9 4.6 - 10.2 K/uL   Lymph, poc 2.0 0.6 - 3.4   POC LYMPH PERCENT 22.8 10 - 50 %L   MID (cbc) 0.5 0 - 0.9   POC MID % 6.0 0 - 12 %M   POC Granulocyte 6.3 2 - 6.9   Granulocyte percent 71.2 37 - 80 %G   RBC 4.53 4.04 - 5.48 M/uL   Hemoglobin 12.7 12.2 - 16.2 g/dL   HCT, POC 36.6 (A) 37.7 - 47.9 %   MCV 80.8 80 - 97 fL   MCH, POC 27.9 27 - 31.2 pg   MCHC 34.6 31.8 - 35.4 g/dL   RDW, POC 15.8 %   Platelet Count, POC 240 142 - 424 K/uL   MPV 7.9 0 - 99.8 fL  POCT urinalysis dipstick  Result Value Ref Range   Color, UA yellow yellow   Clarity, UA clear clear   Glucose, UA negative negative   Bilirubin, UA negative negative   Ketones, POC UA negative negative   Spec Grav, UA 1.015    Blood, UA large (A) negative   pH, UA 6.0    Protein Ur, POC =30 (A) negative   Urobilinogen, UA 0.2    Nitrite, UA Negative Negative   Leukocytes, UA Trace (A) Negative  POCT Microscopic Urinalysis ([facility])  Result Value Ref Range   WBC,UR,HPF,POC Moderate (A) None WBC/hpf   RBC,UR,HPF,POC Too numerous to count  (A) None  RBC/hpf   Bacteria None None, Too numerous to count   Mucus Absent Absent   Epithelial Cells, UR Per Microscopy Few (A) None, Too numerous to count cells/hpf   Her predicted peak flow was 390 and it was 190 before neb. Peak flow 200 post duoneb  Pt felt a little better after treatment.  Assessment & Plan:   1. Hypothyroidism, unspecified type - recheck tsh as levothyroxine dose decreased from 75 to 50 > 2 mos prior, will need levothyroxine refills after labs returned.  2. Hematuria, unspecified type - asked pt to RTC asap for pelvic exam and repeat UA, send UClx  3. Essential hypertension - well controlled on current regimen, cont  4. Upper respiratory tract infection, unspecified type -  5. Medication monitoring encounter   6. Chronic bronchitis, unspecified chronic bronchitis type ([place]) - pt has had bronchitic illness/pneumonia 5x in <5 mos but I don't think she has been formally diagnosed with COPD yet nor treated. Refer to pulm for spirometry, start maintanence inhaler (sent in Advair but can changed based upon insurance formulary prn.) Refilled albuterol. Had 2 CXRs 3 mos prior only sig for Rt calcified granuloma but no adv imaging of chest. Current infection appears viral and environmental allergies likely contributing. Treated with DepoMedrol 80mg  IM x 1 in office today. Start maintenance inhaler, start singulair, claritin, and try prn tessalon. If sxs worsen with increased productivity of cough, wheeze, or subj f/c, may need to call in oral prednisone 40mg  qd x 5d with z-pack.  Has been on augmentin and doxy within the past 3 mos. Note given to rest during the day in the shelter as needed  7. Bilateral hip joint arthritis - rtc for xrays and consider troch bursa cortisone injection  8. History of tobacco use   9. Lumbar arthropathy ([place])  Pt concerned her poor dentition is triggering recurrent lung infxns.  Try peridex mouth wash.  Orders Placed This Encounter  Procedures  .  Urine culture  . TSH  . Comprehensive metabolic panel  . Ambulatory referral to Pulmonology    Referral Priority:   Routine    Referral Type:   Consultation    Referral Reason:   Specialty Services Required    Requested Specialty:   Pulmonary Disease    Number of Visits Requested:   1  . Ambulatory referral to Physical Therapy    Referral Priority:   Routine    Referral Type:   Physical Medicine    Referral Reason:   Specialty Services Required    Requested Specialty:   Physical Therapy    Number of Visits Requested:   1  . Ambulatory referral to Orthopedic Surgery    Referral Priority:   Routine    Referral Type:   Surgical    Referral Reason:   Specialty Services Required    Requested Specialty:   Orthopedic Surgery    Number of Visits Requested:   1  . Care order/instruction:    Scheduling Instructions:     Peak Flow (IF NEB IS ORDERED PLEASE DO BEFORE AND AFTER NEB)  . Care order/instruction:    Scheduling Instructions:     Complete orders, AVS and go.  Marland Kitchen POCT CBC  . POCT urinalysis dipstick  . POCT Microscopic Urinalysis ([facility])    Meds ordered this encounter  Medications  . albuterol (PROVENTIL) (2.5 MG/3ML) 0.083% nebulizer solution 2.5 mg  . ipratropium (ATROVENT) nebulizer solution 0.5 mg  . loratadine (CLARITIN) 10 MG tablet    Sig: Take 1 tablet (10 mg total) by mouth daily.    Dispense:  30 tablet    Refill:  2  . montelukast (SINGULAIR) 10 MG tablet    Sig: Take 1 tablet (10 mg total) by mouth at bedtime.    Dispense:  30 tablet    Refill:  2  . albuterol (PROVENTIL HFA;VENTOLIN HFA) 108 (90 Base) MCG/ACT inhaler    Sig: Inhale 2 puffs into the lungs every 4 (four) hours as needed for wheezing or shortness of breath (and/or cough).    Dispense:  1 Inhaler    Refill:  11  . Fluticasone-Salmeterol (ADVAIR DISKUS) 250-50 MCG/DOSE AEPB    Sig: Inhale 1 puff into the lungs 2 (two) times daily.    Dispense:  60 each    Refill:  1  . benzonatate (TESSALON) 100  MG capsule    Sig: Take 1-2 capsules (100-200 mg total) by mouth 3 (three) times daily as needed for cough.    Dispense:  60 capsule    Refill:  0  . methylPREDNISolone acetate (DEPO-MEDROL) injection 80 mg  . chlorhexidine (PERIDEX) 0.12 % solution    Sig: Use as directed 15 mLs in the mouth or throat 2 (two) times daily.    Dispense:  200 mL    Refill:  0    I personally performed the services described in this documentation, which was scribed in my presence. The recorded information has been reviewed and considered, and addended by me as needed.   [name], M.D.  Primary Care at [facility] [address] [place], [place] [address] [phone] phone [phone] fax  05/08/16 9:27 AM

## 2016-05-06 LAB — COMPREHENSIVE METABOLIC PANEL
A/G RATIO: 1.9 (ref 1.2–2.2)
ALT: 6 IU/L (ref 0–32)
AST: 17 IU/L (ref 0–40)
Albumin: 4.1 g/dL (ref 3.5–4.8)
Alkaline Phosphatase: 111 IU/L (ref 39–117)
BUN/Creatinine Ratio: 13 (ref 12–28)
BUN: 10 mg/dL (ref 8–27)
Bilirubin Total: 0.3 mg/dL (ref 0.0–1.2)
CALCIUM: 8.7 mg/dL (ref 8.7–10.3)
CO2: 26 mmol/L (ref 18–29)
Chloride: 97 mmol/L (ref 96–106)
Creatinine, Ser: 0.79 mg/dL (ref 0.57–1.00)
GFR calc Af Amer: 84 mL/min/{1.73_m2} (ref >59)
GFR, EST NON AFRICAN AMERICAN: 73 mL/min/{1.73_m2} (ref >59)
Globulin, Total: 2.2 g/dL (ref 1.5–4.5)
Glucose: 137 mg/dL — ABNORMAL HIGH (ref 65–99)
POTASSIUM: 4.2 mmol/L (ref 3.5–5.2)
Sodium: 141 mmol/L (ref 134–144)
Total Protein: 6.3 g/dL (ref 6.0–8.5)

## 2016-05-07 ENCOUNTER — Telehealth: Payer: Self-pay | Admitting: Family Medicine

## 2016-05-07 ENCOUNTER — Telehealth: Payer: Self-pay

## 2016-05-07 LAB — URINE CULTURE

## 2016-05-07 NOTE — Telephone Encounter (Signed)
Pt calling says dr.shaw was going to send in prescription she still has not heard anything   Her cough is keeping her up all night  Please advise [phone]

## 2016-05-07 NOTE — Telephone Encounter (Signed)
V[facility]m [facility]  I dont see a note about meds since her office visit, we are working on pa inhaler but other sent in?

## 2016-05-07 NOTE — Telephone Encounter (Signed)
Pa needed for proventil hfa [id number] covermymeds

## 2016-05-08 MED ORDER — PROMETHAZINE-DM 6.25-15 MG/5ML PO SYRP: 5.0000 mL | ORAL_SOLUTION | Freq: Four times a day (QID) | ORAL | 0 refills | Status: DC | PRN

## 2016-05-08 NOTE — Telephone Encounter (Signed)
Spoke with pharmacy all of these were filled including advair, ventolin was sub. The tessalon were not covered and cost 23.00 Not picked up yet

## 2016-05-08 NOTE — Telephone Encounter (Signed)
I sent these into the [facility] on Sat so pt should have claritin, singulair, tessalon, and the peridex mouth wash in addition to the 2 inhalers.  I am happy to put pt on a different inhaler I just don' know what is on her formulary. She has not failed anything - she just needs a steroid and LABA combo (so symbicort, dulera would work as alternatives). Let me know if you know if one of these might not need a PA> She also got a DepoMedrol shot while in the office. How is she feeling?  Any better? Worse? Will call in antibiotic if worsening. . . .  . loratadine (CLARITIN) 10 MG tablet    Sig: Take 1 tablet (10 mg total) by mouth daily.    Dispense:  30 tablet    Refill:  2  . montelukast (SINGULAIR) 10 MG tablet    Sig: Take 1 tablet (10 mg total) by mouth at bedtime.    Dispense:  30 tablet    Refill:  2  . albuterol (PROVENTIL HFA;VENTOLIN HFA) 108 (90 Base) MCG/ACT inhaler    Sig: Inhale 2 puffs into the lungs every 4 (four) hours as needed for wheezing or shortness of breath (and/or cough).    Dispense:  1 Inhaler    Refill:  11  . Fluticasone-Salmeterol (ADVAIR DISKUS) 250-50 MCG/DOSE AEPB    Sig: Inhale 1 puff into the lungs 2 (two) times daily.    Dispense:  60 each    Refill:  1  . benzonatate (TESSALON) 100 MG capsule    Sig: Take 1-2 capsules (100-200 mg total) by mouth 3 (three) times daily as needed for cough.    Dispense:  60 capsule    Refill:  0     . chlorhexidine (PERIDEX) 0.12 % solution    Sig: Use as directed 15 mLs in the mouth or throat 2 (two) times daily.    Dispense:  200 mL    Refill:  0

## 2016-05-08 NOTE — Telephone Encounter (Signed)
PT CALLED AGAIN ABOUT MEDICINE FOR COUGH I ADVISED HER THAT MEDICINE WAS ALREADY CALLED IN SHE ALSO WANT DR SHAW TO WRITE A DETAILED LETTER TO THE [facility] ABOUT PT NEEDING REST BECAUSE OF HIP AND BACK PAIN AND SHE WOULD ALSO LIKE SOME PAIN MEDICINE

## 2016-05-08 NOTE — Telephone Encounter (Signed)
No pain medicine.  Fine to provide letter to the shelter for whatever pt feels she needs.

## 2016-05-08 NOTE — Telephone Encounter (Signed)
pending

## 2016-05-08 NOTE — Telephone Encounter (Signed)
changed

## 2016-05-08 NOTE — Telephone Encounter (Signed)
We can change to whatever albuterol inhaler is covered by pt's insurance. - proair or ventolin are fine.

## 2016-05-08 NOTE — Telephone Encounter (Signed)
N/a when I called pt

## 2016-05-08 NOTE — Telephone Encounter (Signed)
Tessalon cx, new syrup is 15.00 They will deliver today

## 2016-05-08 NOTE — Congregational Nurse Program (Signed)
Congregational Nurse Program Note  Date of Encounter: 05/07/2016  Past Medical History: Past Medical History:  Diagnosis Date  . Arthritis   . Fibromyalgia   . Hiatal hernia   . High cholesterol   . History of degenerative disc disease   . Hypertension   . PNA (pneumonia)   . Thyroid disease     Encounter Details:     CNP Questionnaire - 05/07/16 0956      Patient Demographics   Is this a new or existing patient? Existing   Patient is considered a/an Not Applicable   Race Caucasian/White     Patient Assistance   Location of Patient Assistance [facility]   Patient's financial/insurance status Medicaid;Medicare   Uninsured Patient ([facility]) No   Patient referred to apply for the following financial assistance Not Applicable   Food insecurities addressed Not Applicable   Transportation assistance No   Assistance securing medications No   Educational health offerings Acute disease     Encounter Details   Primary purpose of visit Acute Illness/Condition Visit   Was an Emergency Department visit averted? Yes   Does patient have a medical provider? Yes   Patient referred to Follow up with established PCP   Was a mental health screening completed? (GAINS tool) No   Does patient have dental issues? No   Does patient have vision issues? No   Does your patient have an abnormal blood pressure today? No   Since previous encounter, have you referred patient for abnormal blood pressure that resulted in a new diagnosis or medication change? No   Does your patient have an abnormal blood glucose today? No   Since previous encounter, have you referred patient for abnormal blood glucose that resulted in a new diagnosis or medication change? No   Was there a life-saving intervention made? No    Client requested assistance with locating the phone number of her PHP.  Assistance given

## 2016-05-08 NOTE — Telephone Encounter (Signed)
Please call pharmacy and remind them that pt needs these delivered to the shelter - she does not have transportation.  I forgot that her insurance in unlikely to cover any cough meds.  Will send in promethazine-DM for her to try instead if less expensive but could use otc Delsym if she prefers. THANKS!

## 2016-05-08 NOTE — Telephone Encounter (Signed)
Isabel Collins-appears you are working with patient regarding medications and PA. See note from Dr.Shaw

## 2016-05-10 ENCOUNTER — Ambulatory Visit (INDEPENDENT_AMBULATORY_CARE_PROVIDER_SITE_OTHER): Payer: Medicare Other | Admitting: Orthopedic Surgery

## 2016-05-10 ENCOUNTER — Encounter (INDEPENDENT_AMBULATORY_CARE_PROVIDER_SITE_OTHER): Payer: Self-pay | Admitting: Orthopedic Surgery

## 2016-05-10 VITALS — Ht 61.0 in | Wt 158.0 lb

## 2016-05-10 DIAGNOSIS — M47817 Spondylosis without myelopathy or radiculopathy, lumbosacral region: Secondary | ICD-10-CM | POA: Diagnosis not present

## 2016-05-10 MED ORDER — PREDNISONE 10 MG PO TABS: 20.0000 mg | ORAL_TABLET | Freq: Every day | ORAL | 0 refills | Status: DC

## 2016-05-10 NOTE — Progress Notes (Addendum)
Office Visit Note   Patient: Isabel Collins           Date of Birth: 14-Feb-1941           MRN: [id number] Visit Date: 05/10/2016              Requested by: No referring provider defined for this encounter. PCP: [name], MD  Chief Complaint  Patient presents with  . Lower Back - Pain    HPI: Patient complains of LBP and states that it runs hip to hip and that she has started leaning to the right and that the pain is now traveling down down to her hips. She states that this has been going on for years and does not note any specific injury.  She did have x rays on 04/20/16 that shows some arthritic changes to her back and she would like to discuss treatment options. Autumn L Forrest, RMA  Patient states she has a history of fibromyalgia and recently has had pneumonia and the flu and has been admitted for treatment .  Assessment & Plan: Visit Diagnoses:  1. Spondylosis without myelopathy or radiculopathy, lumbosacral region     Plan: We'll call in a prescription for low dose prednisone. Patient states that she does needs Transportation to get around and we will complete paperwork first Transportation. Patient was given instructions that she is not to be at bed rest and that she needs to get up and move around that she is to use heat on her back to relax the muscles and stay active.  Follow-Up Instructions: Return in about 4 weeks (around 06/07/2016).   Ortho Exam Patient is alert oriented no adenopathy well-dressed normal affect normal respiratory effort she has a normal gait. Examination she has a negative straight leg raise bilaterally. She has no pain with motor testing in either lower extremity she has good hip flexion the flexion and extension plantarflexion and dorsiflexion of the foot. Her radiographs were reviewed which shows arthritic changes at the facet region. The disc space are maintained she does have some bony spurs T 12 L1-L2 and L3 with calcification of the aorta but no  evidence of an aneurysm. ROS: Complete review of systems negative except as mentioned in the history of present illness. Imaging: No results found.  Labs: Lab Results  Component Value Date   ESRSEDRATE 4 02/10/2016   REPTSTATUS 12/25/2015 FINAL 12/23/2015   CULT (A) 12/23/2015    <10,000 COLONIES/mL INSIGNIFICANT GROWTH Performed at [facility]     Orders:  No orders of the defined types were placed in this encounter.  Meds ordered this encounter  Medications  . predniSONE (DELTASONE) 10 MG tablet    Sig: Take 2 tablets (20 mg total) by mouth daily with breakfast.    Dispense:  60 tablet    Refill:  0     Procedures: No procedures performed  Clinical Data: No additional findings.  Subjective: Review of Systems  Objective: Vital Signs: Ht 5\' 1"  (1.549 m)   Wt 158 lb (71.7 kg)   BMI 29.85 kg/m   Specialty Comments:  No specialty comments available.  PMFS History: Patient Active Problem List   Diagnosis Date Noted  . 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 Hypertension 02/10/2016  . High cholesterol 02/10/2016  . Hiatal hernia 02/10/2016   Past Medical History:  Diagnosis Date  . Arthritis   . Fibromyalgia   . Hiatal hernia   . High cholesterol   .  History of degenerative disc disease   . Hypertension   . PNA (pneumonia)   . Thyroid disease     No family history on file.  Past Surgical History:  Procedure Laterality Date  . arm surgery    . BREAST SURGERY    . REPLACEMENT TOTAL KNEE    . TOTAL SHOULDER REPLACEMENT     Social History   Occupational History  . Not on file.   Social History Main Topics  . Smoking status: Former Research scientist (life sciences)  . Smokeless tobacco: Never Used  . Alcohol use No  . Drug use: No  . Sexual activity: Not on file

## 2016-05-11 ENCOUNTER — Encounter: Payer: Self-pay | Admitting: Family Medicine

## 2016-05-12 ENCOUNTER — Ambulatory Visit: Payer: Medicare Other

## 2016-05-15 ENCOUNTER — Telehealth: Payer: Self-pay | Admitting: Physical Therapy

## 2016-05-15 NOTE — Telephone Encounter (Signed)
05/08/16 & 05/10/16 attempted to leave message unable to leave message, no answering machine

## 2016-05-19 ENCOUNTER — Telehealth: Payer: Self-pay | Admitting: Family Medicine

## 2016-05-19 NOTE — Telephone Encounter (Signed)
Pt is still coughing of gray phlim and chest is still hurting  Pt would like to know if a antibodic   Best number [phone]

## 2016-05-20 ENCOUNTER — Encounter (HOSPITAL_COMMUNITY): Payer: Self-pay | Admitting: Emergency Medicine

## 2016-05-20 ENCOUNTER — Emergency Department (HOSPITAL_COMMUNITY)
Admission: EM | Admit: 2016-05-20 | Discharge: 2016-05-20 | Disposition: A | Discharge status: Home / Self Care | Payer: Medicare Other | Attending: Emergency Medicine | Admitting: Emergency Medicine

## 2016-05-20 ENCOUNTER — Emergency Department (HOSPITAL_COMMUNITY): Payer: Medicare Other

## 2016-05-20 DIAGNOSIS — R05 Cough: Secondary | ICD-10-CM

## 2016-05-20 DIAGNOSIS — R062 Wheezing: Secondary | ICD-10-CM | POA: Insufficient documentation

## 2016-05-20 DIAGNOSIS — R059 Cough, unspecified: Secondary | ICD-10-CM

## 2016-05-20 LAB — CBC WITH DIFFERENTIAL/PLATELET
BASOS ABS: 0 10*3/uL (ref 0.0–0.1)
BASOS PCT: 0 %
Eosinophils Absolute: 0 10*3/uL (ref 0.0–0.7)
Eosinophils Relative: 0 %
HEMATOCRIT: 44.2 % (ref 36.0–46.0)
HEMOGLOBIN: 14.2 g/dL (ref 12.0–15.0)
Lymphocytes Relative: 26 %
Lymphs Abs: 3 10*3/uL (ref 0.7–4.0)
MCH: 26.6 pg (ref 26.0–34.0)
MCHC: 32.1 g/dL (ref 30.0–36.0)
MCV: 82.9 fL (ref 78.0–100.0)
MONOS PCT: 7 %
Monocytes Absolute: 0.8 10*3/uL (ref 0.1–1.0)
NEUTROS ABS: 7.6 10*3/uL (ref 1.7–7.7)
NEUTROS PCT: 67 %
Platelets: 231 10*3/uL (ref 150–400)
RBC: 5.33 MIL/uL — ABNORMAL HIGH (ref 3.87–5.11)
RDW: 15.1 % (ref 11.5–15.5)
WBC: 11.4 10*3/uL — ABNORMAL HIGH (ref 4.0–10.5)

## 2016-05-20 LAB — I-STAT TROPONIN, ED: TROPONIN I, POC: 0 ng/mL (ref 0.00–0.08)

## 2016-05-20 LAB — BASIC METABOLIC PANEL
ANION GAP: 11 (ref 5–15)
BUN: 18 mg/dL (ref 6–20)
CHLORIDE: 101 mmol/L (ref 101–111)
CO2: 29 mmol/L (ref 22–32)
Calcium: 9.4 mg/dL (ref 8.9–10.3)
Creatinine, Ser: 0.81 mg/dL (ref 0.44–1.00)
GFR calc non Af Amer: >60 mL/min (ref >60)
Glucose, Bld: 114 mg/dL — ABNORMAL HIGH (ref 65–99)
Potassium: 4 mmol/L (ref 3.5–5.1)
Sodium: 141 mmol/L (ref 135–145)

## 2016-05-20 MED ORDER — ALBUTEROL SULFATE (2.5 MG/3ML) 0.083% IN NEBU
5.0000 mg | INHALATION_SOLUTION | Freq: Once | RESPIRATORY_TRACT | Status: AC
  Administered 2016-05-20: 5 mg via RESPIRATORY_TRACT
  Filled 2016-05-20: qty 6

## 2016-05-20 MED ORDER — ALBUTEROL SULFATE HFA 108 (90 BASE) MCG/ACT IN AERS: 1.0000 | INHALATION_SPRAY | Freq: Four times a day (QID) | RESPIRATORY_TRACT | 0 refills | Status: AC | PRN

## 2016-05-20 MED ORDER — AZITHROMYCIN 250 MG PO TABS: 250.0000 mg | ORAL_TABLET | Freq: Every day | ORAL | 0 refills | Status: DC

## 2016-05-20 MED ORDER — IPRATROPIUM BROMIDE 0.02 % IN SOLN
0.5000 mg | Freq: Once | RESPIRATORY_TRACT | Status: AC
  Administered 2016-05-20: 0.5 mg via RESPIRATORY_TRACT
  Filled 2016-05-20: qty 2.5

## 2016-05-20 NOTE — ED Notes (Signed)
Respiratory made aware of neb treatment

## 2016-05-20 NOTE — ED Notes (Signed)
Patient transported back from X-ray 

## 2016-05-20 NOTE — Discharge Instructions (Signed)
Take the prescribed medication as directed.  Use inhaler when needed.  Continue your prednisone. Follow-up with your primary care doctor. Return to the ED for new or worsening symptoms-- chest pain, shortness of breath, high fever, etc.

## 2016-05-20 NOTE — ED Provider Notes (Signed)
[facility] DEPT Provider Note   CSN: [id number] Arrival date & time: 05/20/16  1739     History   Chief Complaint Chief Complaint  Patient presents with  . Cough    HPI Isabel Collins is a 76 y.o. female.  The history is provided by the patient and medical records.  Cough  Associated symptoms include shortness of breath.    76 year old female with history of arthritis, fibromyalgia, hyperlipidemia, hypertension, thyroid disease, presenting to the ED for cough. Patient reports this is been ongoing for the past month or so. States her cough has been productive with greenish colored sputum. She denies any hemoptysis. States she does have some intermittent shortness of breath with heavy coughing or walking longer distances. She has no known cardiac history. She is a former smoker, quit in 2006. Patient states she did call her doctor about this a few weeks ago and was prescribed some cough syrup as well as antibiotics, but has not had any improvement. Patient is also currently on prednisone for her hip pain. States her primary care doctor was going to refer her to a pulmonologist and had an appointment last Thursday, however she is currently homeless and living at a shelter and did not have any transportation.  Past Medical History:  Diagnosis Date  . Arthritis   . Fibromyalgia   . Hiatal hernia   . High cholesterol   . History of degenerative disc disease   . Hypertension   . PNA (pneumonia)   . Thyroid disease     Patient Active Problem List   Diagnosis Date Noted  . 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 Hypertension 02/10/2016  . High cholesterol 02/10/2016  . Hiatal hernia 02/10/2016    Past Surgical History:  Procedure Laterality Date  . arm surgery    . BREAST SURGERY    . REPLACEMENT TOTAL KNEE    . TOTAL SHOULDER REPLACEMENT      OB History    No data available       Home Medications    Prior to Admission  medications   Medication Sig Start Date End Date Taking? Authorizing Provider  albuterol (PROVENTIL HFA;VENTOLIN HFA) 108 (90 Base) MCG/ACT inhaler Inhale 2 puffs into the lungs every 4 (four) hours as needed for wheezing or shortness of breath (and/or cough). 05/05/16   [name], MD  atorvastatin (LIPITOR) 40 MG tablet Take 40 mg by mouth every evening.     Historical Provider, MD  benzonatate (TESSALON) 100 MG capsule Take 1-2 capsules (100-200 mg total) by mouth 3 (three) times daily as needed for cough. 05/05/16   [name], MD  celecoxib (CELEBREX) 200 MG capsule Take 1 capsule (200 mg total) by mouth 2 (two) times daily. 04/06/16   Chelle Jeffery, PA-C  chlorhexidine (PERIDEX) 0.12 % solution Use as directed 15 mLs in the mouth or throat 2 (two) times daily. 05/05/16   [name], MD  cycloSPORINE (RESTASIS) 0.05 % ophthalmic emulsion Place 1 drop into both eyes 2 (two) times daily. 03/13/16   [name], MD  DULoxetine (CYMBALTA) 30 MG capsule Take 30-60 mg by mouth 2 (two) times daily. Pt takes two capsules in the morning and one capsule in the afternoon.    Historical Provider, MD  fluticasone (FLONASE) 50 MCG/ACT nasal spray Place 1-2 sprays into both nostrils daily as needed for rhinitis.    Historical Provider, MD  Fluticasone-Salmeterol (ADVAIR DISKUS) 250-50 MCG/DOSE AEPB Inhale 1 puff  into the lungs 2 (two) times daily. 05/05/16   [name], MD  levothyroxine (SYNTHROID, LEVOTHROID) 75 MCG tablet Take 75 mcg by mouth daily before breakfast.    Historical Provider, MD  loratadine (CLARITIN) 10 MG tablet Take 1 tablet (10 mg total) by mouth daily. 05/05/16   [name], MD  metoprolol succinate (TOPROL-XL) 100 MG 24 hr tablet Take 100 mg by mouth daily. Take with or immediately following a meal.    Historical Provider, MD  montelukast (SINGULAIR) 10 MG tablet Take 1 tablet (10 mg total) by mouth at bedtime. 05/05/16   [name], MD  predniSONE (DELTASONE) 10 MG tablet Take 2 tablets (20 mg total) by  mouth daily with breakfast. 05/10/16   Newt Minion, MD  pregabalin (LYRICA) 150 MG capsule Take 150 mg by mouth 2 (two) times daily.    Historical Provider, MD  promethazine-dextromethorphan (PROMETHAZINE-DM) 6.25-15 MG/5ML syrup Take 5 mLs by mouth 4 (four) times daily as needed for cough. 05/08/16   [name], MD  traZODone (DESYREL) 50 MG tablet Take 50 mg by mouth at bedtime as needed for sleep.    Historical Provider, MD    Family History No family history on file.  Social History Social History  Substance Use Topics  . Smoking status: Former Research scientist (life sciences)  . Smokeless tobacco: Never Used  . Alcohol use No     Allergies   Patient has no known allergies.   Review of Systems Review of Systems  Respiratory: Positive for cough and shortness of breath.   All other systems reviewed and are negative.    Physical Exam Updated Vital Signs SpO2 94%   Physical Exam  Constitutional: She is oriented to person, place, and time. She appears well-developed and well-nourished.  HENT:  Head: Normocephalic and atraumatic.  Mouth/Throat: Oropharynx is clear and moist.  Eyes: Conjunctivae and EOM are normal. Pupils are equal, round, and reactive to light.  Neck: Normal range of motion.  Cardiovascular: Normal rate, regular rhythm and normal heart sounds.   Pulmonary/Chest: Effort normal. No respiratory distress. She has wheezes.  No acute distress, expiratory wheezes noted throughout, no rhonchi or rales, able to speak in full sentences without difficulty  Abdominal: Soft. Bowel sounds are normal. There is no tenderness. There is no rebound.  Musculoskeletal: Normal range of motion.  Neurological: She is alert and oriented to person, place, and time.  Skin: Skin is warm and dry.  Psychiatric: She has a normal mood and affect.  Nursing note and vitals reviewed.    ED Treatments / Results  Labs (all labs ordered are listed, but only abnormal results are displayed) Labs Reviewed  CBC  WITH DIFFERENTIAL/PLATELET - Abnormal; Notable for the following:       Result Value   WBC 11.4 (*)    RBC 5.33 (*)    All other components within normal limits  BASIC METABOLIC PANEL - Abnormal; Notable for the following:    Glucose, Bld 114 (*)    All other components within normal limits  I-STAT TROPOININ, ED    EKG  EKG Interpretation  Date/Time:  Sunday May 20 2016 18:21:39 EDT Ventricular Rate:  83 PR Interval:    QRS Duration: 94 QT Interval:  397 QTC Calculation: 467 R Axis:   18 Text Interpretation:  Sinus rhythm Probable left atrial enlargement Low voltage, precordial leads RSR' in V1 or V2, probably normal variant No significant change since last tracing Confirmed by ISAACS MD, CAMERON (  [id number]) on 05/20/2016 6:24:01 PM       Radiology Dg Chest 2 View  Result Date: 05/20/2016 CLINICAL DATA:  76 year old female with cough. EXAM: CHEST  2 VIEW COMPARISON:  Chest radiograph dated 02/20/2016 FINDINGS: The lungs are clear. There is no pleural effusion or pneumothorax. Stable right mid lung field calcified granuloma noted. There is a moderate size hiatal hernia. Right shoulder hemiarthroplasty. No acute fracture. IMPRESSION: No active cardiopulmonary disease. Moderate hiatal hernia. Electronically Signed   By: Anner Crete M.D.   On: 05/20/2016 18:49    Procedures Procedures (including critical care time)  Medications Ordered in ED Medications  albuterol (PROVENTIL) (2.5 MG/3ML) 0.083% nebulizer solution 5 mg (5 mg Nebulization Given 05/20/16 1903)  albuterol (PROVENTIL) (2.5 MG/3ML) 0.083% nebulizer solution 5 mg (5 mg Nebulization Given 05/20/16 2003)  ipratropium (ATROVENT) nebulizer solution 0.5 mg (0.5 mg Nebulization Given 05/20/16 2003)     Initial Impression / Assessment and Plan / ED Course  I have reviewed the triage vital signs and the nursing notes.  Pertinent labs & imaging results that were available during my care of the patient were reviewed by me  and considered in my medical decision making (see chart for details).  76 year old female here with productive cough for the past month. She is afebrile and nontoxic in appearance here. She does have some expiratory wheezes on exam but is in no acute distress. Her vital signs are stable on room air. Screening lab work obtained, mild leukocytosis noted, patient has also concurrently on prednisone which may account for some of this. Troponin is negative. Chest x-ray is clear. Lung sounds have cleared after nebs 2. Remains without any respiratory distress.  Vitals have remained stable. At this time, symptoms and physical exam findings are concerning for bronchitis. Given her age, COPD history, and living situation, feel would be prudent to cover her with antibiotics. Will start azithromycin, will have her continue her prednisone. I've refilled her inhaler as well. She will follow-up closely with her primary care doctor, also recommended that she reschedule her pulmonology appointment as she missed this last week.  Discussed plan with patient, she acknowledged understanding and agreed with plan of care.  Return precautions given for new or worsening symptoms.  Case discussed with attending physician, Dr. Leonette Monarch, who evaluated patient and agrees with assessment and plan of care.  Final Clinical Impressions(s) / ED Diagnoses   Final diagnoses:  Cough  Wheezing    New Prescriptions Discharge Medication List as of 05/20/2016  8:42 PM    START taking these medications   Details  azithromycin (ZITHROMAX) 250 MG tablet Take 1 tablet (250 mg total) by mouth daily. Take first 2 tablets together, then 1 every day until finished., Starting Sun 05/20/2016, Print         Larene Pickett, PA-C 05/20/16 2133    Fatima Blank, MD 05/21/16 [phone]

## 2016-05-20 NOTE — ED Triage Notes (Signed)
Pt from homeless shelter via EMS with complaints of cough x 1 month. Pt has hx of htn and is noncompliant with medications. Pt has been taking prednisone for hip pain, but has taken no other medications. Pt had a pulmonologist appointment this past Thursday, but she did not make it to her appointment.

## 2016-05-22 ENCOUNTER — Telehealth: Payer: Self-pay | Admitting: Physical Therapy

## 2016-05-22 NOTE — Telephone Encounter (Signed)
Called on 05/08/16 & 05/10/16, unable to leave message to schedule PT eval

## 2016-05-23 ENCOUNTER — Ambulatory Visit (INDEPENDENT_AMBULATORY_CARE_PROVIDER_SITE_OTHER): Payer: Medicare Other | Admitting: Orthopedic Surgery

## 2016-05-23 ENCOUNTER — Telehealth (INDEPENDENT_AMBULATORY_CARE_PROVIDER_SITE_OTHER): Payer: Self-pay | Admitting: *Deleted

## 2016-05-23 ENCOUNTER — Encounter (INDEPENDENT_AMBULATORY_CARE_PROVIDER_SITE_OTHER): Payer: Self-pay | Admitting: Orthopedic Surgery

## 2016-05-23 DIAGNOSIS — M47817 Spondylosis without myelopathy or radiculopathy, lumbosacral region: Secondary | ICD-10-CM

## 2016-05-23 DIAGNOSIS — M797 Fibromyalgia: Secondary | ICD-10-CM | POA: Diagnosis not present

## 2016-05-23 NOTE — Telephone Encounter (Signed)
Attempted to call pt - number rang at [employer] - pt is homeless.  No answer.  IC Candace Brown at [facility] to advise we are trying to get in touch with pt to come in for evaluation so Dr. Brigitte Pulse can fill out the info.   Isabel Collins with [facility] returned call.  Following discussion, her disabilities are being treated by Dr. Sharol Given, so she will fax the form to [facility] since pt has an appt today and they can expedite the completion of forms.

## 2016-05-23 NOTE — Congregational Nurse Program (Signed)
Congregational Nurse Program Note  Date of Encounter: 05/21/2016  Past Medical History: Past Medical History:  Diagnosis Date  . Arthritis   . Fibromyalgia   . Hiatal hernia   . High cholesterol   . History of degenerative disc disease   . Hypertension   . PNA (pneumonia)   . Thyroid disease     Encounter Details:     CNP Questionnaire - 05/21/16 8185      Patient Demographics   Is this a new or existing patient? Existing   Patient is considered a/an Not Applicable   Race Caucasian/White     Patient Assistance   Location of Patient Assistance Not Applicable   Patient's financial/insurance status Low Income;Medicare   Uninsured Patient ([facility]) No   Patient referred to apply for the following financial assistance Not Applicable   Food insecurities addressed Not Applicable   Transportation assistance No   Assistance securing medications Yes   Type of Restaurant manager, fast food health offerings Chronic disease;Behavioral health;Medications     Encounter Details   Primary purpose of visit Acute Illness/Condition Visit;Post ED/Hospitalization Visit   Was an Emergency Department visit averted? Not Applicable   Does patient have a medical provider? Yes   Patient referred to Not Applicable   Was a mental health screening completed? (GAINS tool) No   Does patient have dental issues? No   Does patient have vision issues? No   Does your patient have an abnormal blood pressure today? No   Since previous encounter, have you referred patient for abnormal blood pressure that resulted in a new diagnosis or medication change? No   Does your patient have an abnormal blood glucose today? No   Since previous encounter, have you referred patient for abnormal blood glucose that resulted in a new diagnosis or medication change? No   Was there a life-saving intervention made? No    Just returned to the shelter from the ED.  Client is pale and has labored  breathing.  Instructed client to have bed rest for rest of day.  ED discharge scripts filled at [facility] and delivered to client

## 2016-05-23 NOTE — Telephone Encounter (Signed)
Needs to be seen asap

## 2016-05-23 NOTE — Telephone Encounter (Signed)
Patient called in this afternoon in regards to needing a prescription for a walker if at all possible please? Thank you Her CB # [phone].

## 2016-05-23 NOTE — Telephone Encounter (Signed)
Seen 05/20/16 in ed

## 2016-05-23 NOTE — Progress Notes (Signed)
Office Visit Note   Patient: Isabel Collins           Date of Birth: [birth year]           MRN: [id number] Visit Date: 05/23/2016              Requested by: [name], MD [address] [place], [place] [address] PCP: [name], MD  Chief Complaint  Patient presents with  . Lower Back - Follow-up    NID:[id number] is 76 year old woman who presents stating that the prednisone didn't help her back pain quite a bit. She does also complain of shortness of breath he Hospital and referred her to a pulmonologist and paperwork was provided for her for her to follow-up with the pulmonologist. She complains of wheezing. She was seen in emergency room on 05/20/2016 and her follow-up paperwork was provided for her. Patient states that the back pain is primarily located transversely across her lumbar spine he states she has weakness and fatigue difficulty walking long distances. HPI  Assessment & Plan: Visit Diagnoses:  1. Spondylosis without myelopathy or radiculopathy, lumbosacral region   2. Fibromyalgia     Plan: Recommended heat on her back recommended walking recommended that she could take ibuprofen 400 mg 3 times a day with food. Discussed that if she develops any GI symptoms to stop taking the anti-inflammatory immediately. Discussed that she has bad flareups that we can provide her with another prednisone Dosepak.  Follow-Up Instructions: Return if symptoms worsen or fail to improve.   Ortho Exam Examination patient is alert oriented no adenopathy well-dressed normal affect she does have shortness of breath while seated she has difficulty with ambulation. Examination of both legs she has a negative straight leg raise bilaterally no radicular symptoms no focal motor weakness in either lower extremity. ROS: Complete review of systems negative except as noted in the history of present illness. Imaging: No results found.  Labs: Lab Results  Component Value Date   ESRSEDRATE 4 02/10/2016     REPTSTATUS 12/25/2015 FINAL 12/23/2015   CULT (A) 12/23/2015    <10,000 COLONIES/mL INSIGNIFICANT GROWTH Performed at [facility]     Orders:  No orders of the defined types were placed in this encounter.  No orders of the defined types were placed in this encounter.    Procedures: No procedures performed  Clinical Data: No additional findings.  Subjective: Review of Systems  Objective: Vital Signs: There were no vitals taken for this visit.  Specialty Comments:  No specialty comments available.  PMFS History: Patient Active Problem List   Diagnosis Date Noted  . Fibromyalgia 05/23/2016  . 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 Hypertension 02/10/2016  . High cholesterol 02/10/2016  . Hiatal hernia 02/10/2016   Past Medical History:  Diagnosis Date  . Arthritis   . Fibromyalgia   . Hiatal hernia   . High cholesterol   . History of degenerative disc disease   . Hypertension   . PNA (pneumonia)   . Thyroid disease     No family history on file.  Past Surgical History:  Procedure Laterality Date  . arm surgery    . BREAST SURGERY    . REPLACEMENT TOTAL KNEE    . TOTAL SHOULDER REPLACEMENT     Social History   Occupational History  . Not on file.   Social History Main Topics  . Smoking status: Former Research scientist (life sciences)  . Smokeless tobacco: Never Used  . Alcohol use No  .  Drug use: No  . Sexual activity: Not on file       

## 2016-05-28 ENCOUNTER — Other Ambulatory Visit (INDEPENDENT_AMBULATORY_CARE_PROVIDER_SITE_OTHER): Payer: Self-pay

## 2016-05-29 NOTE — Telephone Encounter (Signed)
This has been written and mailed to pt

## 2016-05-31 ENCOUNTER — Other Ambulatory Visit: Payer: Self-pay | Admitting: Family Medicine

## 2016-05-31 ENCOUNTER — Other Ambulatory Visit: Payer: Self-pay | Admitting: Physician Assistant

## 2016-06-02 ENCOUNTER — Other Ambulatory Visit: Payer: Self-pay | Admitting: Physician Assistant

## 2016-06-02 NOTE — Telephone Encounter (Signed)
04/06/16 last refill 05/2016 last ov

## 2016-06-02 NOTE — Telephone Encounter (Signed)
She saw Dr. Sharol Given, her orthopedic surgeon, who told her to take ibuprofen 400mg  every 4 hours and she is on periodic bursts of prednisone - please make sure she is not using ibuprofen or any other otc nsaid - Do not use celebrex with any other otc pain medication other than tylenol/acetaminophen - so no aleve, ibuprofen, motrin, advil, etc.  She should also not take the celebrex whenever she is on bursts of prednisone for her back or her lungs.

## 2016-06-04 ENCOUNTER — Ambulatory Visit (INDEPENDENT_AMBULATORY_CARE_PROVIDER_SITE_OTHER): Payer: Medicare Other | Admitting: Urgent Care

## 2016-06-04 VITALS — BP 133/71 | HR 72 | Temp 97.6°F | Resp 16 | Ht 61.0 in | Wt 158.0 lb

## 2016-06-04 DIAGNOSIS — R739 Hyperglycemia, unspecified: Secondary | ICD-10-CM

## 2016-06-04 DIAGNOSIS — R5383 Other fatigue: Secondary | ICD-10-CM | POA: Diagnosis not present

## 2016-06-04 DIAGNOSIS — M160 Bilateral primary osteoarthritis of hip: Secondary | ICD-10-CM | POA: Diagnosis not present

## 2016-06-04 DIAGNOSIS — N3090 Cystitis, unspecified without hematuria: Secondary | ICD-10-CM | POA: Diagnosis not present

## 2016-06-04 DIAGNOSIS — R310 Gross hematuria: Secondary | ICD-10-CM | POA: Diagnosis not present

## 2016-06-04 LAB — POCT URINALYSIS DIP (MANUAL ENTRY)
Bilirubin, UA: NEGATIVE
Glucose, UA: NEGATIVE
Leukocytes, UA: NEGATIVE
NITRITE UA: NEGATIVE
SPEC GRAV UA: 1.015 (ref 1.030–1.035)
UROBILINOGEN UA: 0.2 (ref ?–2.0)
pH, UA: 5.5 (ref 5.0–8.0)

## 2016-06-04 LAB — POCT CBC
GRANULOCYTE PERCENT: 66.2 % (ref 37–80)
HCT, POC: 37.5 % — AB (ref 37.7–47.9)
HEMOGLOBIN: 12.7 g/dL (ref 12.2–16.2)
Lymph, poc: 3 (ref 0.6–3.4)
MCH: 28.1 pg (ref 27–31.2)
MCHC: 34 g/dL (ref 31.8–35.4)
MCV: 82.7 fL (ref 80–97)
MID (cbc): 0.5 (ref 0–0.9)
MPV: 7.9 fL (ref 0–99.8)
PLATELET COUNT, POC: 234 10*3/uL (ref 142–424)
POC Granulocyte: 6.8 (ref 2–6.9)
POC LYMPH PERCENT: 29.4 %L (ref 10–50)
POC MID %: 4.4 %M (ref 0–12)
RBC: 4.54 M/uL (ref 4.04–5.48)
RDW, POC: 16.5 %
WBC: 10.3 10*3/uL — AB (ref 4.6–10.2)

## 2016-06-04 LAB — POC MICROSCOPIC URINALYSIS (UMFC): Mucus: ABSENT

## 2016-06-04 LAB — GLUCOSE, POCT (MANUAL RESULT ENTRY): POC GLUCOSE: 154 mg/dL — AB (ref 70–99)

## 2016-06-04 MED ORDER — PREDNISONE 20 MG PO TABS: 40.0000 mg | ORAL_TABLET | Freq: Every day | ORAL | 0 refills | Status: DC

## 2016-06-04 MED ORDER — CEPHALEXIN 500 MG PO CAPS: 500.0000 mg | ORAL_CAPSULE | Freq: Two times a day (BID) | ORAL | 0 refills | Status: DC

## 2016-06-04 NOTE — Telephone Encounter (Signed)
lmtcb

## 2016-06-04 NOTE — Progress Notes (Signed)
MRN: [id number] DOB: February 16, 1941  Subjective:   Isabel Collins is a 76 y.o. female presenting for chief complaint of Hematuria  Reports onset of hematuria, mild dysuria today. Has pelvic pressure, low back pain, fatigue. Admits that she has had incontinence over the past 2 months. Has not hydrated well since August. She has been living in a shelter but is not willing to share why. Has a history of DDD, spondylosis, fibromyalgia. This is managed with prednisone once daily. Also has a history of hiatal hernia, has had difficult time obtaining a script for brand Nexium. Denies fever, n/v, lower abdominal pain, flank pain, urinary urgency, numbness or tingling.  Isabel Collins has a current medication list which includes the following prescription(s): albuterol, atorvastatin, celecoxib, cyclosporine, duloxetine, fluticasone, fluticasone-salmeterol, levothyroxine, metoprolol succinate, prednisone, pregabalin, trazodone, and montelukast. Also has No Known Allergies.  Isabel Collins  has a past medical history of Arthritis; Fibromyalgia; Hiatal hernia; High cholesterol; History of degenerative disc disease; Hypertension; PNA (pneumonia); and Thyroid disease. Also  has a past surgical history that includes Replacement total knee; Total shoulder replacement; Breast surgery; and arm surgery.  Objective:   Vitals: BP 133/71   Pulse 72   Temp 97.6 F (36.4 C)   Resp 16   Ht 5\' 1"  (1.549 m)   Wt 158 lb (71.7 kg)   SpO2 96%   BMI 29.85 kg/m   Physical Exam  Constitutional: She is oriented to person, place, and time. She appears well-developed and well-nourished.  HENT:  Mouth/Throat: Oropharynx is clear and moist.  Eyes: No scleral icterus.  Cardiovascular: Normal rate, regular rhythm and intact distal pulses.  Exam reveals no gallop and no friction rub.   No murmur heard. Pulmonary/Chest: No respiratory distress. She has no wheezes. She has no rales.  Abdominal: Soft. Bowel sounds are normal. She exhibits  no distension and no mass. There is no tenderness. There is no guarding.  No CVA tenderness.  Musculoskeletal:       Lumbar back: She exhibits decreased range of motion (moving gingerly from her back and hip pain) and tenderness (bilateral hips over areas outlined). She exhibits no bony tenderness, no swelling, no edema, no deformity and no spasm.       Back:  Neurological: She is alert and oriented to person, place, and time.  Skin: Skin is warm and dry.  Psychiatric: She has a normal mood and affect.   Results for orders placed or performed in visit on 06/04/16 (from the past 24 hour(s))  POCT urinalysis dipstick     Status: Abnormal   Collection Time: 06/04/16  2:30 PM  Result Value Ref Range   Color, UA yellow yellow   Clarity, UA cloudy (A) clear   Glucose, UA negative negative   Bilirubin, UA negative negative   Ketones, POC UA small (15) (A) negative   Spec Grav, UA 1.015 1.030 - 1.035   Blood, UA large (A) negative   pH, UA 5.5 5.0 - 8.0   Protein Ur, POC =100 (A) negative   Urobilinogen, UA 0.2 Negative - 2.0   Nitrite, UA Negative Negative   Leukocytes, UA Negative Negative  POCT Microscopic Urinalysis ([facility])     Status: Abnormal   Collection Time: 06/04/16  2:52 PM  Result Value Ref Range   WBC,UR,HPF,POC Moderate (A) None WBC/hpf   RBC,UR,HPF,POC Too numerous to count  (A) None RBC/hpf   Bacteria Few (A) None, Too numerous to count   Mucus Absent Absent   Epithelial Cells, UR Per Microscopy  Few (A) None, Too numerous to count cells/hpf   Assessment and Plan :   1. Cystitis 2. Gross hematuria 3. Fatigue, unspecified type - Will cover for infectious process. Urine culture and bmet pending. Patient will likely need referral to urology to rule out malignancy given extensive smoking history. Call results to patient at [phone].  4. Elevated blood sugar - Hemoglobin A1c pending.  5. Bilateral hip joint arthritis - Start prednisone 40mg  with breakfast for 5 days  for her severe hip pain.  Jaynee Eagles, PA-C [facility] [phone] 06/04/2016  2:43 PM

## 2016-06-04 NOTE — Patient Instructions (Addendum)
Urinary Tract Infection, Adult A urinary tract infection (UTI) is an infection of any part of the urinary tract, which includes the kidneys, ureters, bladder, and urethra. These organs make, store, and get rid of urine in the body. UTI can be a bladder infection (cystitis) or kidney infection (pyelonephritis). What are the causes? This infection may be caused by fungi, viruses, or bacteria. Bacteria are the most common cause of UTIs. This condition can also be caused by repeated incomplete emptying of the bladder during urination. What increases the risk? This condition is more likely to develop if:  You ignore your need to urinate or hold urine for long periods of time.  You do not empty your bladder completely during urination.  You wipe back to front after urinating or having a bowel movement, if you are female.  You are uncircumcised, if you are female.  You are constipated.  You have a urinary catheter that stays in place (indwelling).  You have a weak defense (immune) system.  You have a medical condition that affects your bowels, kidneys, or bladder.  You have diabetes.  You take antibiotic medicines frequently or for long periods of time, and the antibiotics no longer work well against certain types of infections (antibiotic resistance).  You take medicines that irritate your urinary tract.  You are exposed to chemicals that irritate your urinary tract.  You are female. What are the signs or symptoms? Symptoms of this condition include:  Fever.  Frequent urination or passing small amounts of urine frequently.  Needing to urinate urgently.  Pain or burning with urination.  Urine that smells bad or unusual.  Cloudy urine.  Pain in the lower abdomen or back.  Trouble urinating.  Blood in the urine.  Vomiting or being less hungry than normal.  Diarrhea or abdominal pain.  Vaginal discharge, if you are female. How is this diagnosed? This condition is  diagnosed with a medical history and physical exam. You will also need to provide a urine sample to test your urine. Other tests may be done, including:  Blood tests.  Sexually transmitted disease (STD) testing. If you have had more than one UTI, a cystoscopy or imaging studies may be done to determine the cause of the infections. How is this treated? Treatment for this condition often includes a combination of two or more of the following:  Antibiotic medicine.  Other medicines to treat less common causes of UTI.  Over-the-counter medicines to treat pain.  Drinking enough water to stay hydrated. Follow these instructions at home:  Take over-the-counter and prescription medicines only as told by your health care provider.  If you were prescribed an antibiotic, take it as told by your health care provider. Do not stop taking the antibiotic even if you start to feel better.  Avoid alcohol, caffeine, tea, and carbonated beverages. They can irritate your bladder.  Drink enough fluid to keep your urine clear or pale yellow.  Keep all follow-up visits as told by your health care provider. This is important.  Make sure to:  Empty your bladder often and completely. Do not hold urine for long periods of time.  Empty your bladder before and after sex.  Wipe from front to back after a bowel movement if you are female. Use each tissue one time when you wipe. Contact a health care provider if:  You have back pain.  You have a fever.  You feel nauseous or vomit.  Your symptoms do not get better after 3   days.  Your symptoms go away and then return. Get help right away if:  You have severe back pain or lower abdominal pain.  You are vomiting and cannot keep down any medicines or water. This information is not intended to replace advice given to you by your health care provider. Make sure you discuss any questions you have with your health care provider. Document Released:  11/29/2004 Document Revised: 08/03/2015 Document Reviewed: 01/10/2015 Elsevier Interactive Patient Education  2017 Elsevier Inc.    Hematuria, Adult Hematuria is blood in your urine. It can be caused by a bladder infection, kidney infection, prostate infection, kidney stone, or cancer of your urinary tract. Infections can usually be treated with medicine, and a kidney stone usually will pass through your urine. If neither of these is the cause of your hematuria, further workup to find out the reason may be needed. It is very important that you tell your health care provider about any blood you see in your urine, even if the blood stops without treatment or happens without causing pain. Blood in your urine that happens and then stops and then happens again can be a symptom of a very serious condition. Also, pain is not a symptom in the initial stages of many urinary cancers. Follow these instructions at home:  Drink lots of fluid, 3-4 quarts a day. If you have been diagnosed with an infection, cranberry juice is especially recommended, in addition to large amounts of water.  Avoid caffeine, tea, and carbonated beverages because they tend to irritate the bladder.  Avoid alcohol because it may irritate the prostate.  Take all medicines as directed by your health care provider.  If you were prescribed an antibiotic medicine, finish it all even if you start to feel better.  If you have been diagnosed with a kidney stone, follow your health care provider's instructions regarding straining your urine to catch the stone.  Empty your bladder often. Avoid holding urine for long periods of time.  After a bowel movement, women should cleanse front to back. Use each tissue only once.  Empty your bladder before and after sexual intercourse if you are a female. Contact a health care provider if:  You develop back pain.  You have a fever.  You have a feeling of sickness in your stomach (nausea) or  vomiting.  Your symptoms are not better in 3 days. Return sooner if you are getting worse. Get help right away if:  You develop severe vomiting and are unable to keep the medicine down.  You develop severe back or abdominal pain despite taking your medicines.  You begin passing a large amount of blood or clots in your urine.  You feel extremely weak or faint, or you pass out. This information is not intended to replace advice given to you by your health care provider. Make sure you discuss any questions you have with your health care provider. Document Released: 02/19/2005 Document Revised: 07/28/2015 Document Reviewed: 10/20/2012 Elsevier Interactive Patient Education  2017 Elsevier Inc.     Hip Pain The hip is the joint between the upper legs and the lower pelvis. The bones, cartilage, tendons, and muscles of your hip joint support your body and allow you to move around. Hip pain can range from a minor ache to severe pain in one or both of your hips. The pain may be felt on the inside of the hip joint near the groin, or the outside near the buttocks and upper thigh. You may  also have swelling or stiffness. Follow these instructions at home: Managing pain, stiffness, and swelling   If directed, apply ice to the injured area.  Put ice in a plastic bag.  Place a towel between your skin and the bag.  Leave the ice on for 20 minutes, 2-3 times a day  Sleep with a pillow between your legs on your most comfortable side.  Avoid any activities that cause pain. General instructions   Take over-the-counter and prescription medicines only as told by your health care provider.  Do any exercises as told by your health care provider.  Record the following:  How often you have hip pain.  The location of your pain.  What the pain feels like.  What makes the pain worse.  Keep all follow-up visits as told by your health care provider. This is important. Contact a health care  provider if:  You cannot put weight on your leg.  Your pain or swelling continues or gets worse after one week.  It gets harder to walk.  You have a fever. Get help right away if:  You fall.  You have a sudden increase in pain and swelling in your hip.  Your hip is red or swollen or very tender to touch. Summary  Hip pain can range from a minor ache to severe pain in one or both of your hips.  The pain may be felt on the inside of the hip joint near the groin, or the outside near the buttocks and upper thigh.  Avoid any activities that cause pain.  Record how often you have hip pain, the location of the pain, what makes it worse and what it feels like. This information is not intended to replace advice given to you by your health care provider. Make sure you discuss any questions you have with your health care provider. Document Released: 08/09/2009 Document Revised: 01/23/2016 Document Reviewed: 01/23/2016 Elsevier Interactive Patient Education  2017 [employer].     IF you received an x-ray today, you will receive an invoice from [facility]. Please contact [facility] at [phone] with questions or concerns regarding your invoice.   IF you received labwork today, you will receive an invoice from [facility]. Please contact [facility] at [phone] with questions or concerns regarding your invoice.   Our billing staff will not be able to assist you with questions regarding bills from these companies.  You will be contacted with the lab results as soon as they are available. The fastest way to get your results is to activate your My Chart account. Instructions are located on the last page of this paperwork. If you have not heard from [place] regarding the results in 2 weeks, please contact this office.

## 2016-06-05 LAB — HEMOGLOBIN A1C
Est. average glucose Bld gHb Est-mCnc: 157 mg/dL
Hgb A1c MFr Bld: 7.1 % — ABNORMAL HIGH (ref 4.8–5.6)

## 2016-06-05 LAB — BASIC METABOLIC PANEL
BUN / CREAT RATIO: 24 (ref 12–28)
BUN: 21 mg/dL (ref 8–27)
CHLORIDE: 102 mmol/L (ref 96–106)
CO2: 27 mmol/L (ref 18–29)
Calcium: 9.4 mg/dL (ref 8.7–10.3)
Creatinine, Ser: 0.89 mg/dL (ref 0.57–1.00)
GFR, EST AFRICAN AMERICAN: 73 mL/min/{1.73_m2} (ref >59)
GFR, EST NON AFRICAN AMERICAN: 63 mL/min/{1.73_m2} (ref >59)
Glucose: 87 mg/dL (ref 65–99)
Potassium: 4.9 mmol/L (ref 3.5–5.2)
Sodium: 144 mmol/L (ref 134–144)

## 2016-06-06 ENCOUNTER — Encounter: Payer: Self-pay | Admitting: Urgent Care

## 2016-06-06 ENCOUNTER — Other Ambulatory Visit: Payer: Self-pay | Admitting: Family Medicine

## 2016-06-06 LAB — URINE CULTURE

## 2016-06-07 ENCOUNTER — Ambulatory Visit (INDEPENDENT_AMBULATORY_CARE_PROVIDER_SITE_OTHER): Payer: Medicare Other | Admitting: Orthopedic Surgery

## 2016-06-07 NOTE — Congregational Nurse Program (Signed)
Congregational Nurse Program Note  Date of Encounter: 05/04/2016  Past Medical History: Past Medical History:  Diagnosis Date  . Arthritis   . Fibromyalgia   . Hiatal hernia   . High cholesterol   . History of degenerative disc disease   . Hypertension   . PNA (pneumonia)   . Thyroid disease     Encounter Details:     CNP Questionnaire - 05/21/16 4287      Patient Demographics   Is this a new or existing patient? Existing   Patient is considered a/an Not Applicable   Race Caucasian/White     Patient Assistance   Location of Patient Assistance Not Applicable   Patient's financial/insurance status Low Income;Medicare   Uninsured Patient ([facility]) No   Patient referred to apply for the following financial assistance Not Applicable   Food insecurities addressed Not Applicable   Transportation assistance No   Assistance securing medications Yes   Type of Restaurant manager, fast food health offerings Chronic disease;Behavioral health;Medications     Encounter Details   Primary purpose of visit Acute Illness/Condition Visit;Post ED/Hospitalization Visit   Was an Emergency Department visit averted? Not Applicable   Does patient have a medical provider? Yes   Patient referred to Not Applicable   Was a mental health screening completed? (GAINS tool) No   Does patient have dental issues? No   Does patient have vision issues? No   Does your patient have an abnormal blood pressure today? No   Since previous encounter, have you referred patient for abnormal blood pressure that resulted in a new diagnosis or medication change? No   Does your patient have an abnormal blood glucose today? No   Since previous encounter, have you referred patient for abnormal blood glucose that resulted in a new diagnosis or medication change? No   Was there a life-saving intervention made? No     Client c/o continued URI symptoms including productive cough. BBS with  expiratory wheeze. Assisted with making appointment to see PCP in am.

## 2016-06-09 NOTE — Telephone Encounter (Signed)
Pt was seen in the office since this request - on 4/2 by Rosario Adie and was not refilled then so guessing she is not to be on it.

## 2016-06-11 ENCOUNTER — Telehealth: Payer: Self-pay | Admitting: Physician Assistant

## 2016-06-11 MED ORDER — NEXIUM 40 MG PO CPDR: 40.0000 mg | DELAYED_RELEASE_CAPSULE | Freq: Every day | ORAL | 1 refills | Status: DC

## 2016-06-11 NOTE — Telephone Encounter (Signed)
PATIENT IS REQUESTING A REFILL ON GENERIC CELEBREX 200 MG AND NEXIUM. SHE SAID WE KEEP GIVING HER THE GENERIC OF THE [place] AND SHE CAN NOT TAKE IT BECAUSE IT DOES NOT DO ANYTHING FOR HER. SHE ALSO WANTS ON FILE AT THE PHARMACY THE LYRICA 150 MG. SHE SAID SHE HAS A FEW LEFT. SHE WANTS TO KNOW WHAT SHE NEEDS TO DO ABOUT HER URINE INFECTION? SHE SAW MARIO MANI ON Monday. SHE HAS 1 ANTIBIOTIC LEFT. BEST PHONE [phone] (CELL) PHARMACY CHOICE IS [facility]. [place]

## 2016-06-11 NOTE — Telephone Encounter (Signed)
Ronalee Belts Mani's labs state he wrote a letter which I copied and pasted below:  your results show that you have normal electrolytes and kidney function. Your urine culture was also negative. However, you do have diabetes as seen by your a1c level. This is a measure of an average of your blood sugar for the past 3 months. This is what we use to accurately diagnose diabetes. Please make sure that you return to our clinic if your symptoms of pain have persisted and to discuss management of your diabetes.  So she doesn't need to do anything about her UTI since she doesn't have one.  If she wants to pay for brandname nexium, that is fine.   See last celebrex refill request response that was never received a response: She saw Dr. Sharol Given, her orthopedic surgeon, who told her to take ibuprofen 400mg  every 4 hours and she is on periodic bursts of prednisone - please make sure she is not using ibuprofen or any other otc nsaid - Do not use celebrex with any other otc pain medication other than tylenol/acetaminophen - so no aleve, ibuprofen, motrin, advil, etc.  She should also not take the celebrex whenever she is on bursts of prednisone for her back or her lungs.  If patient wants her medications, she needs to make and keep an appointment with me for medication refill so that we can get everything done appropriately at once since she is so difficult to get a hold of.

## 2016-06-19 ENCOUNTER — Telehealth: Payer: Self-pay | Admitting: Urgent Care

## 2016-06-19 DIAGNOSIS — K449 Diaphragmatic hernia without obstruction or gangrene: Secondary | ICD-10-CM

## 2016-06-19 DIAGNOSIS — R195 Other fecal abnormalities: Secondary | ICD-10-CM

## 2016-06-19 DIAGNOSIS — K219 Gastro-esophageal reflux disease without esophagitis: Secondary | ICD-10-CM

## 2016-06-19 DIAGNOSIS — Z791 Long term (current) use of non-steroidal anti-inflammatories (NSAID): Secondary | ICD-10-CM

## 2016-06-19 NOTE — Telephone Encounter (Signed)
Pt requesting referral for Gastro. Says she discussed this at her last appt and symptoms have gotten worse. She is able to go to the bathroom but is having dark/bloody stools. Pt cannot currently be reached at her phone number as her phone is out. Pt was given phone number to [facility] and told to check in with them later this week in enough time for referral to be placed and sent out. Pt also requesting prescription for purple pill  for hernia.She says this is the only medicine that works for her for this. Please advise.

## 2016-06-20 ENCOUNTER — Telehealth: Payer: Self-pay | Admitting: Family Medicine

## 2016-06-20 NOTE — Telephone Encounter (Signed)
The numbers that was in patients chart was unavailable or the wrong number I called [facility] pt no longer stay there and don't know how to reach patient I sent out unable to reach letter today

## 2016-06-20 NOTE — Telephone Encounter (Signed)
Pt needs to come in and be seen for this asap.    is usually booked for months out and the dark/black stools are very concerning - it might be very dangerous for her to wait to long for evaluation but we cannot ask that she be worked in urgently with GI without a repeat eval and assessment in the office to determine severity.  In the interim, she needs to stop the prednisone and celebrex. Do not use any other otc pain medication other than tylenol/acetaminophen - so no aleve, ibuprofen, motrin, advil, etc.   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  No sign in chart of any discussion of GI sxs beyond GERD prior.

## 2016-06-20 NOTE — Telephone Encounter (Signed)
Received request through cover my meds (Key code [id number]) request was for nexium name brand only. Completed request and they should respond within 3 days.

## 2016-06-21 NOTE — Telephone Encounter (Signed)
Pt was unable to give a contact number that we could reach her on when she initially called due to her phone being out. I did advise pt to come back in or go to the ER if her symptoms persisted. Pt had to borrow phone to call. I also tried both phone numbers for pt we had but could not reach her.

## 2016-06-21 NOTE — Telephone Encounter (Addendum)
Home number is disconnected. Cell number - Kathy's phone - LMOM to return if this is Taylore's number otherwise apologized for disruption. Her emergency is [employer] - called them to try and they will have someone call us back.

## 2016-06-21 NOTE — Telephone Encounter (Signed)
Dr Brigitte Pulse - FYI - cannot get a hold of patient through her home or cell number (a person named Juliann Pulse).  Did contact her urgent contact which was [employer] and they will call us back.

## 2016-06-22 ENCOUNTER — Telehealth: Payer: Self-pay | Admitting: Family Medicine

## 2016-06-22 NOTE — Telephone Encounter (Signed)
Pt got lab results saying that based on her A1C levels she is a prediabetic.  She is concerned with these results and is asking if there is anything that she needs to be taking as far as medications to treat this.  I encouraged her to set up a follow up appt with Brigitte Pulse but pt refused.  Please advise [phone]

## 2016-06-23 NOTE — Telephone Encounter (Signed)
SHAW - Pt called again, this time about having dark stool.  She wants to know if we can mail her out a test kit to check it because it is so hard for her to come in.   She is also asking for a referral to get at home assistance 2 or 3 times a week with things such as laundry. Call her at [phone]

## 2016-06-25 ENCOUNTER — Ambulatory Visit (INDEPENDENT_AMBULATORY_CARE_PROVIDER_SITE_OTHER): Payer: Medicare Other | Admitting: Internal Medicine

## 2016-06-25 ENCOUNTER — Other Ambulatory Visit: Payer: Self-pay | Admitting: Urgent Care

## 2016-06-25 ENCOUNTER — Encounter: Payer: Self-pay | Admitting: Internal Medicine

## 2016-06-25 VITALS — BP 136/80 | HR 76 | Ht 62.0 in | Wt 155.4 lb

## 2016-06-25 DIAGNOSIS — R0609 Other forms of dyspnea: Secondary | ICD-10-CM | POA: Diagnosis not present

## 2016-06-25 NOTE — Patient Instructions (Signed)
Stop advair to see what difference if any it makes   Nexium  40 mg   Take  30-60 min before first meal of the day and Pepcid (famotidine)  20 mg one @  bedtime   GERD (REFLUX)  is an extremely common cause of respiratory symptoms just like yours , many times with no obvious heartburn at all.    It can be treated with medication, but also with lifestyle changes including elevation of the head of your bed (ideally with 6 inch  bed blocks),  Smoking cessation, avoidance of late meals, excessive alcohol, and avoid fatty foods, chocolate, peppermint, colas, red wine, and acidic juices such as orange juice.  NO MINT OR MENTHOL PRODUCTS SO NO COUGH DROPS  USE SUGARLESS CANDY INSTEAD (Jolley ranchers or Stover's or Life Savers) or even ice chips will also do - the key is to swallow to prevent all throat clearing. NO OIL BASED VITAMINS - use powdered substitutes.  Be sure your thyroid gets rechecked at your next office visit with Dr Brigitte Pulse

## 2016-06-25 NOTE — Assessment & Plan Note (Signed)
Spirometry 06/25/2016  FEV1 1.31 (69%)  Ratio 79 with truncation of effort dep portion/ no advair day of study  - 06/25/2016   Walked RA  2 laps @ 185 ft each stopped due to  Sob= fatigue/ no desats at moderate pace   Symptoms are markedly disproportionate to objective findings and not clear this is a lung problem but pt does appear to have difficult airway management issues.  DDX of  difficult airways management almost all start with A and  include Adherence, Ace Inhibitors, Acid Reflux, Active Sinus Disease, Alpha 1 Antitripsin deficiency, Anxiety masquerading as Airways dz,  ABPA,  Allergy(esp in young), Aspiration (esp in elderly), Adverse effects of meds,  Active smokers, A bunch of PE's (a small clot burden can't cause this syndrome unless there is already severe underlying pulm or vascular dz with poor reserve) plus two Bs  = Bronchiectasis and Beta blocker use..and one C= CHF   Adherence is always the initial "prime suspect" and is a multilayered concern that requires a "trust but verify" approach in every patient - starting with knowing how to use medications, especially inhalers, correctly, keeping up with refills and understanding the fundamental difference between maintenance and prns vs those medications only taken for a very short course and then stopped and not refilled.  - very shaky on meds, doubt she's using them as listed  - would insist shw always return with all meds in hand using a trust but verify approach to confirm accurate Medication  Reconciliation The principal here is that until we are certain that the  patients are doing what we've asked, it makes no sense to ask them to do more.   ? Acid (or non-acid) GERD > always difficult to exclude as up to 75% of pts in some series report no assoc GI/ Heartburn symptoms and she has an impressive HH > rec max (24h)  acid suppression and diet restrictions/ reviewed and instructions given in writing.   ? Adverse drug effects, esp Advair >  try off  ? Anxiety/depression/ deconditioning  > usually at the bottom of this list of usual suspects but should be much higher on this pt's based on H and P and note already on psychotropics .   ? Allergy/ asthma > doubt, ok to continue singulair but try off advair for now and regroup in 4 weeks if not improving with rx for gerd  NB Of the three most common causes of  Sub-acute or recurrent or chronic cough, only one (GERD)  can actually contribute to/ trigger  the other two (asthma and post nasal drip syndrome)  and perpetuate the cylce of cough.  While not intuitively obvious, many patients with chronic low grade reflux (and she likely does re moderately large HH) do not cough until there is a primary insult that disturbs the protective epithelial barrier and exposes sensitive nerve endings.   This is typically viral as was likely the case here but can be direct physical injury such as with an endotracheal tube.   The point is that once this occurs, it is difficult to eliminate the cycle  using anything but a maximally effective acid suppression regimen at least in the short run, accompanied by an appropriate diet to address non acid GERD and control / eliminate the cough itself for at least 3 days.    .Total time devoted to counseling  > 50 % of initial 60 min office visit:  review case with pt/ discussion of options/alternatives/ personally creating  written customized instructions  in presence of pt  then going over those specific  Instructions directly with the pt including how to use all of the meds but in particular covering each new medication in detail and the difference between the maintenance= "automatic" meds and the prns using an action plan format for the latter (If this problem/symptom => do that organization reading Left to right).  Please see AVS from this visit for a full list of these instructions which I personally wrote for this pt and  are unique to this visit.

## 2016-06-25 NOTE — Progress Notes (Signed)
Subjective:     Patient ID: Isabel Collins, female   DOB: 1941-02-11,    MRN: [id number]  HPI  42 yowf quit smoking in 2006 with no sequelae but relatively sedentary due to arthritis with baseline doe = walking up to 30 min s stopping but acutely ill Oct 2017 with LLL CAP and never back to baseline since so referred to pulmonary clinic 06/25/2016 by Dr   [name]     06/25/2016 1st [facility] office visit/ Isabel Collins   Chief Complaint  Patient presents with  . Pulmonary Consult    Referred by Dr. [name]. Pt c/o SOB, fatigue and wheezing for the past few months. She states she will occ get SOB just walking short distances such as from room to room.    doe x 15 min walking slowly or sooner if gets in a hurry  Was not using any resp meds prior to admit but not on singulair/ saba/advair/ flonase and does not perceive any benefit at this point  Sob Assoc with mod HB on nexium 40 mg varies as to when she takes it but usually  Sleeping is fine    No obvious day to day or daytime variability or assoc excess/ purulent sputum or mucus plugs or hemoptysis or cp or chest tightness, subjective wheeze or overt sinus  symptoms. No unusual exp hx or h/o childhood pna/ asthma or knowledge of premature birth.  Sleeping ok without nocturnal  or early am exacerbation  of respiratory  c/o's or need for noct saba. Also denies any obvious fluctuation of symptoms with weather or environmental changes or other aggravating or alleviating factors except as outlined above   Current Medications, Allergies, Complete Past Medical History, Past Surgical History, Family History, and Social History were reviewed in [employer] record.  ROS  The following are not active complaints unless bolded sore throat, dysphagia, dental problems, itching, sneezing,  nasal congestion or excess/ purulent secretions, ear ache,   fever, chills, sweats, unintended wt loss, classically pleuritic or exertional cp,   orthopnea pnd or leg swelling, presyncope, palpitations, abdominal pain, anorexia, nausea, vomiting, diarrhea  or change in bowel or bladder habits, change in stools or urine, dysuria,hematuria,  rash, arthralgias, visual complaints, headache, numbness, weakness or ataxia or problems with walking or coordination,  change in mood/affect= anxious  or memory.              Review of Systems     Objective:   Physical Exam  amb anxious wf, somewhat evasive with questions   Wt Readings from Last 3 Encounters:  06/25/16 155 lb 6.4 oz (70.5 kg)  06/04/16 158 lb (71.7 kg)  05/10/16 158 lb (71.7 kg)    Vital signs reviewed  - Note on arrival 02 sats  95% on RA    HEENT: nl dentition, turbinates bilaterally, and oropharynx. Nl external ear canals without cough reflex   NECK :  without JVD/Nodes/TM/ nl carotid upstrokes bilaterally   LUNGS: no acc muscle use,  Nl contour chest which is clear to A and P bilaterally without cough on insp or exp maneuvers   CV:  RRR  no s3 or murmur or increase in P2, and no edema   ABD:  soft and nontender with nl inspiratory excursion in the supine position. No bruits or organomegaly appreciated, bowel sounds nl  MS:  Nl gait/ ext warm without deformities, calf tenderness, cyanosis or clubbing No obvious joint restrictions   SKIN: warm and dry  without lesions    NEURO:  alert, approp, nl sensorium with  no motor or cerebellar deficits apparent.     I personally reviewed images and agree with radiology impression as follows:  CXR:    05/20/16 No active cardiopulmonary disease. Moderate hiatal hernia.     Labs ordered/ reviewed:      Chemistry      Component Value Date/Time   NA 144 06/04/2016 1455   K 4.9 06/04/2016 1455   CL 102 06/04/2016 1455   CO2 27 06/04/2016 1455   BUN 21 06/04/2016 1455   CREATININE 0.89 06/04/2016 1455      Component Value Date/Time   CALCIUM 9.4 06/04/2016 1455   ALKPHOS 111 05/05/2016 1409   AST 17  05/05/2016 1409   ALT 6 05/05/2016 1409   BILITOT 0.3 05/05/2016 1409        Lab Results  Component Value Date   WBC 10.3 (A) 06/04/2016   HGB 12.7 06/04/2016   HCT 37.5 (A) 06/04/2016   MCV 82.7 06/04/2016   PLT 231 05/20/2016        Lab Results  Component Value Date   TSH 5.760 (H) 02/10/2016            Assessment:

## 2016-06-26 NOTE — Telephone Encounter (Signed)
Pt called because she has not heard back from [place].  Butch Penny told her about her balance when trying to make an appointment.  She says she does not understand and refuses to pay.  Butch Penny called me and after I reviewed her claims, it looks like she has been told about this twice before.  Referrals has been trying to reach her because Dr. Brigitte Pulse wanted to see her.  They could not get through on the numbers provided.  I tried myself and it gives a busy signal a few times, then hangs up.

## 2016-06-27 ENCOUNTER — Telehealth: Payer: Self-pay | Admitting: Family Medicine

## 2016-06-27 NOTE — Telephone Encounter (Signed)
Pt called asking about her Gastro referral. [place] Gertie Fey has tried to contact the pt to schedule and could not reach her so I gave her their number to directly call. I saw in messages that Dr. Brigitte Pulse would like to see the pt but the pt said she would call back to schedule. Pt did mention she saw a doctor at [facility] yesterday but did not say what for. Pt callback number is [phone]. (I believe phone rings busy when it is being used)

## 2016-06-28 ENCOUNTER — Encounter: Payer: Self-pay | Admitting: Physician Assistant

## 2016-06-28 ENCOUNTER — Telehealth: Payer: Self-pay | Admitting: Family Medicine

## 2016-06-28 MED ORDER — DULOXETINE HCL 30 MG PO CPEP: 30.0000 mg | ORAL_CAPSULE | Freq: Two times a day (BID) | ORAL | 0 refills | Status: DC

## 2016-06-28 NOTE — Telephone Encounter (Signed)
I wonder if patient qualifies for assistance through [facility]?  Left message at patient's number (VM "Juliann Pulse") asking for help communicating with this patient. These numbers provided, as possible resources: [facility] Aid Office [phone]. [employer] and [employer] [phone].

## 2016-06-28 NOTE — Telephone Encounter (Signed)
Last tsh 2017 abnormal, I know we have a hard time reaching this patient or getting her in for an appt.

## 2016-06-28 NOTE — Telephone Encounter (Signed)
Pt is needing a refill of her generic Cymbalta.  She is out of her medication now.  Pt states that she usually takes 60mg  instead of the 30mg .    [phone]

## 2016-07-02 ENCOUNTER — Ambulatory Visit: Payer: Medicare Other | Admitting: Family Medicine

## 2016-07-03 ENCOUNTER — Telehealth: Payer: Self-pay | Admitting: Family Medicine

## 2016-07-03 NOTE — Telephone Encounter (Signed)
Pt is needing to check on status of her refill request on cymbalta and celebrex    Best number [phone]

## 2016-07-03 NOTE — Telephone Encounter (Signed)
Line busy, all refilled in April, needs ov

## 2016-07-04 ENCOUNTER — Ambulatory Visit (INDEPENDENT_AMBULATORY_CARE_PROVIDER_SITE_OTHER): Payer: Medicare Other | Admitting: Physician Assistant

## 2016-07-04 ENCOUNTER — Encounter: Payer: Self-pay | Admitting: Physician Assistant

## 2016-07-04 ENCOUNTER — Other Ambulatory Visit (INDEPENDENT_AMBULATORY_CARE_PROVIDER_SITE_OTHER): Payer: Medicare Other

## 2016-07-04 VITALS — BP 132/92 | HR 76 | Ht 61.0 in | Wt 154.6 lb

## 2016-07-04 DIAGNOSIS — G8929 Other chronic pain: Secondary | ICD-10-CM

## 2016-07-04 DIAGNOSIS — R1031 Right lower quadrant pain: Secondary | ICD-10-CM

## 2016-07-04 DIAGNOSIS — K5909 Other constipation: Secondary | ICD-10-CM

## 2016-07-04 DIAGNOSIS — K219 Gastro-esophageal reflux disease without esophagitis: Secondary | ICD-10-CM | POA: Diagnosis not present

## 2016-07-04 DIAGNOSIS — R195 Other fecal abnormalities: Secondary | ICD-10-CM

## 2016-07-04 DIAGNOSIS — R1032 Left lower quadrant pain: Secondary | ICD-10-CM

## 2016-07-04 LAB — CBC WITH DIFFERENTIAL/PLATELET
BASOS ABS: 0.1 10*3/uL (ref 0.0–0.1)
Basophils Relative: 0.8 % (ref 0.0–3.0)
EOS ABS: 0.3 10*3/uL (ref 0.0–0.7)
Eosinophils Relative: 3.4 % (ref 0.0–5.0)
HEMATOCRIT: 43.9 % (ref 36.0–46.0)
HEMOGLOBIN: 14.5 g/dL (ref 12.0–15.0)
LYMPHS PCT: 30.8 % (ref 12.0–46.0)
Lymphs Abs: 2.4 10*3/uL (ref 0.7–4.0)
MCHC: 32.9 g/dL (ref 30.0–36.0)
MCV: 84 fl (ref 78.0–100.0)
MONOS PCT: 8 % (ref 3.0–12.0)
Monocytes Absolute: 0.6 10*3/uL (ref 0.1–1.0)
NEUTROS ABS: 4.4 10*3/uL (ref 1.4–7.7)
Neutrophils Relative %: 57 % (ref 43.0–77.0)
Platelets: 220 10*3/uL (ref 150.0–400.0)
RBC: 5.22 Mil/uL — AB (ref 3.87–5.11)
RDW: 15.6 % — ABNORMAL HIGH (ref 11.5–15.5)
WBC: 7.7 10*3/uL (ref 4.0–10.5)

## 2016-07-04 NOTE — Progress Notes (Signed)
Initial assessment and plans noted 

## 2016-07-04 NOTE — Progress Notes (Signed)
Subjective:    Patient ID: Isabel Collins, female    DOB: Jan 22, 1941, 76 y.o.   MRN: [id number]  HPI Isabel Collins is a pleasant 76 year old white female, new to GI today referred by [name] M.D. for evaluation of heartburn indigestion abdominal pain and complaints of dark stools. Patient says that she had some GI evaluation done several years ago in [place] and believes that she did have a polyp. This was done at [facility]. History of COPD, hypertension, fibromyalgia, arthritis. Patient relates long-term issues with constipation which have been somewhat worse recently. She says she's been getting intermittent sharp pains in her right side and feels as if there is some sort of blockage "" in her abdomen. She says she used to be able to take laxatives with very good results and now these are not working as well. She's been using Senokot about once a week and prune juice once a week as needed. She may go longer than a week between bowel movements. She says her stools have been "terribly black" over the past month or so she's not noticed any overt blood. She has had a lot of heartburn and indigestion. As long as she takes Nexium this is fairly well-controlled but has recently been requiring Pepcid at bedtime as well.   Patient is on Celebrex twice daily and also has been taking at least 1 ibuprofen each morning. Patient did have CT of the abdomen and pelvis done in October 2017 and this was reviewed. She was noted to have a moderately large hiatal hernia, status post cholecystectomy, scattered diverticulosis and Atherosclerotic changes in the abdominal vasculature. Most recent CBC 06/04/2016 hemoglobin 12.7 hematocrit of 37.5  Review of Systems Pertinent positive and negative review of systems were noted in the above HPI section.  All other review of systems was otherwise negative.  Outpatient Encounter Prescriptions as of 07/04/2016  Medication Sig  . albuterol (PROVENTIL HFA;VENTOLIN HFA)  108 (90 Base) MCG/ACT inhaler Inhale 1-2 puffs into the lungs every 6 (six) hours as needed for wheezing.  Marland Kitchen atorvastatin (LIPITOR) 40 MG tablet Take 40 mg by mouth every evening.   . celecoxib (CELEBREX) 200 MG capsule Take 1 capsule (200 mg total) by mouth 2 (two) times daily as needed for moderate pain. Needs office visit for any additional refills  . cycloSPORINE (RESTASIS) 0.05 % ophthalmic emulsion 1 drop 2 (two) times daily.  . DULoxetine (CYMBALTA) 30 MG capsule Take 1-2 capsules (30-60 mg total) by mouth 2 (two) times daily. Pt takes two capsules in the morning and one capsule in the afternoon.  . famotidine (PEPCID) 10 MG tablet Take 10 mg by mouth at bedtime.  . fluticasone (FLONASE) 50 MCG/ACT nasal spray Place 1-2 sprays into both nostrils daily as needed for rhinitis.  Marland Kitchen levothyroxine (SYNTHROID, LEVOTHROID) 75 MCG tablet Take 75 mcg by mouth daily before breakfast.  . metoprolol succinate (TOPROL-XL) 100 MG 24 hr tablet Take 100 mg by mouth daily. Take with or immediately following a meal.  . NEXIUM 40 MG capsule Take 1 capsule (40 mg total) by mouth daily.  . pregabalin (LYRICA) 150 MG capsule Take 150 mg by mouth 2 (two) times daily.  . traZODone (DESYREL) 50 MG tablet Take 50 mg by mouth at bedtime as needed for sleep.  . [DISCONTINUED] cycloSPORINE (RESTASIS) 0.05 % ophthalmic emulsion Place 1 drop into both eyes 2 (two) times daily.  . [DISCONTINUED] levothyroxine (SYNTHROID, LEVOTHROID) 75 MCG tablet TAKE 1 TABLET BY MOUTH EVERY DAY BEFORE  breakfast. need office visit FOR additional refills  . [DISCONTINUED] montelukast (SINGULAIR) 10 MG tablet Take 1 tablet (10 mg total) by mouth at bedtime.  . [DISCONTINUED] traZODone (DESYREL) 50 MG tablet Take 50 mg by mouth at bedtime as needed for sleep.   No facility-administered encounter medications on file as of 07/04/2016.    No Known Allergies Patient Active Problem List   Diagnosis Date Noted  . Dyspnea on exertion 06/25/2016  .  Fibromyalgia 05/23/2016  . 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 Hypertension 02/10/2016  . High cholesterol 02/10/2016  . Hiatal hernia 02/10/2016   Social History   Social History  . Marital status: Divorced    Spouse name: N/A  . Number of children: N/A  . Years of education: N/A   Occupational History  . Not on file.   Social History Main Topics  . Smoking status: Former Smoker    Packs/day: 0.50    Years: 46.00    Types: Cigarettes    Quit date: 03/05/2004  . Smokeless tobacco: Never Used  . Alcohol use No  . Drug use: No  . Sexual activity: Not on file   Other Topics Concern  . Not on file   Social History Narrative  . No narrative on file    Isabel Collins's family history includes Diabetes in her brother, father, mother, and sister; Heart disease in her father and mother.      Objective:    Vitals:   07/04/16 0921  BP: (!) 132/92  Pulse: 76    Physical Exam  well-developed elderly white female in no acute distress, pleasant blood pressure 132/92 pulse 76, height 5 foot 1, weight 154, BMI 29.2. HEENT; nontraumatic normocephalic EOMI PERRLA sclera anicteric, Cardiovascular ;regular rate and rhythm with S1-S2 no murmur rub or gallop, Pulmonary ;clear bilaterally, Abdomen; soft, there is no focal tenderness, no guarding or rebound no palpable mass or hepatosplenomegaly, Rectal; exam she appears to have perianal and vulvar candidiasis with erythema, stool is brown and Hemoccult negative. Extremities; no clubbing cyanosis or edema skin warm and dry, Neuropsych ;mood and affect appropriate       Assessment & Plan:   #54 76 year old white female with multiple GI complaints including chronic constipation with recent worsening, right-sided abdominal pain, intermittent black stools (documented heme negative today), chronic GERD, and daily NSAID use. #2 status post cholecystectomy #3 history of colon polyps by patient  report #4 COPD no oxygen used #5 fibromyalgia #6 hypertension  Plan; Patient will continue Nexium 40 mg every morning and Pepcid 10 mg by mouth daily at bedtime Repeat CBC today Prune juice daily, and increase Senokot dosing to 2 tablets by mouth at least every other day Patient will be scheduled for colonoscopy and upper endoscopy with Dr. Henrene Pastor. Both procedures discussed in detail with the patient including risks and benefits and she is agreeable to proceed. She will get over-the-counter Monistat cream to apply externally perianal and perineal candidiasis 3 times daily 1014 days  Amy Genia Harold PA-C 07/04/2016   Cc: [name], MD

## 2016-07-04 NOTE — Patient Instructions (Signed)
Please go to the basement level to have your labs drawn.  Continue Nexium 40 mg every morning.  Continue Pepcid  10 mg at bedtime.  Take Prune juice daily. Drink 8 glasses of water daily.  Take over the counter of laxative/Senekot every other day.   Stop Ibuprofen.   Get over the counter Monostat cream for yeast infection, apply 3 times daily.

## 2016-07-05 ENCOUNTER — Emergency Department (HOSPITAL_COMMUNITY)
Admission: EM | Admit: 2016-07-05 | Discharge: 2016-07-05 | Disposition: A | Discharge status: Home / Self Care | Payer: Medicare Other | Attending: Emergency Medicine | Admitting: Emergency Medicine

## 2016-07-05 DIAGNOSIS — N3091 Cystitis, unspecified with hematuria: Secondary | ICD-10-CM

## 2016-07-05 DIAGNOSIS — Z96611 Presence of right artificial shoulder joint: Secondary | ICD-10-CM | POA: Insufficient documentation

## 2016-07-05 DIAGNOSIS — Z79899 Other long term (current) drug therapy: Secondary | ICD-10-CM | POA: Diagnosis not present

## 2016-07-05 DIAGNOSIS — Z87891 Personal history of nicotine dependence: Secondary | ICD-10-CM | POA: Diagnosis not present

## 2016-07-05 DIAGNOSIS — I10 Essential (primary) hypertension: Secondary | ICD-10-CM | POA: Insufficient documentation

## 2016-07-05 DIAGNOSIS — R319 Hematuria, unspecified: Secondary | ICD-10-CM | POA: Diagnosis present

## 2016-07-05 LAB — CBC WITH DIFFERENTIAL/PLATELET
Basophils Absolute: 0 10*3/uL (ref 0.0–0.1)
Basophils Relative: 0 %
Eosinophils Absolute: 0.2 10*3/uL (ref 0.0–0.7)
Eosinophils Relative: 3 %
HEMATOCRIT: 43 % (ref 36.0–46.0)
HEMOGLOBIN: 13.8 g/dL (ref 12.0–15.0)
LYMPHS ABS: 1.5 10*3/uL (ref 0.7–4.0)
Lymphocytes Relative: 25 %
MCH: 27.6 pg (ref 26.0–34.0)
MCHC: 32.1 g/dL (ref 30.0–36.0)
MCV: 86 fL (ref 78.0–100.0)
MONOS PCT: 10 %
Monocytes Absolute: 0.6 10*3/uL (ref 0.1–1.0)
NEUTROS ABS: 3.6 10*3/uL (ref 1.7–7.7)
NEUTROS PCT: 62 %
Platelets: 186 10*3/uL (ref 150–400)
RBC: 5 MIL/uL (ref 3.87–5.11)
RDW: 14.9 % (ref 11.5–15.5)
WBC: 5.9 10*3/uL (ref 4.0–10.5)

## 2016-07-05 LAB — BASIC METABOLIC PANEL
Anion gap: 9 (ref 5–15)
BUN: 8 mg/dL (ref 6–20)
CHLORIDE: 103 mmol/L (ref 101–111)
CO2: 28 mmol/L (ref 22–32)
CREATININE: 0.68 mg/dL (ref 0.44–1.00)
Calcium: 9 mg/dL (ref 8.9–10.3)
GFR calc Af Amer: >60 mL/min (ref >60)
GFR calc non Af Amer: >60 mL/min (ref >60)
Glucose, Bld: 134 mg/dL — ABNORMAL HIGH (ref 65–99)
Potassium: 3.8 mmol/L (ref 3.5–5.1)
Sodium: 140 mmol/L (ref 135–145)

## 2016-07-05 LAB — URINALYSIS, ROUTINE W REFLEX MICROSCOPIC
BACTERIA UA: NONE SEEN
BILIRUBIN URINE: NEGATIVE
Glucose, UA: NEGATIVE mg/dL
KETONES UR: NEGATIVE mg/dL
Leukocytes, UA: NEGATIVE
Nitrite: POSITIVE — AB
PROTEIN: 100 mg/dL — AB
Specific Gravity, Urine: 1.01 (ref 1.005–1.030)
Squamous Epithelial / LPF: NONE SEEN
pH: 8 (ref 5.0–8.0)

## 2016-07-05 MED ORDER — NITROFURANTOIN MONOHYD MACRO 100 MG PO CAPS: 100.0000 mg | ORAL_CAPSULE | Freq: Two times a day (BID) | ORAL | 0 refills | Status: DC

## 2016-07-05 NOTE — ED Notes (Signed)
Bed: RX45 Expected date:  Expected time:  Means of arrival:  Comments: 76 y/o blood in urine

## 2016-07-05 NOTE — ED Triage Notes (Signed)
Per EMS, pt is coming from home with complaints of blood in urine for a couple days. Pt reports being prescribed antibiotics last month for similar issues. Pt reports that urine was cleared up but the blood returned several days ago. No pain and burning with urination. Pt is AO x4 and ambulatory.

## 2016-07-05 NOTE — ED Provider Notes (Signed)
[facility] DEPT Provider Note   CSN: [id number] Arrival date & time: 07/05/16  0900     History   Chief Complaint Chief Complaint  Patient presents with  . Hematuria    HPI Isabel Collins is a 76 y.o. female.  The history is provided by the patient.  Hematuria  This is a recurrent problem. Episode onset: last month. returned today. The problem occurs constantly. The problem has not changed since onset.Associated symptoms include abdominal pain (right flank pain; sporadic, sharp and shooting, lasting only seconds. currently pain free.). Pertinent negatives include no chest pain, no headaches and no shortness of breath. Nothing aggravates the symptoms. The symptoms are relieved by medications. Treatments tried: Abx. The treatment provided significant relief.   Reports that she takes daily motrin 200mg  and celebrex 200mg .  Was treated for UTI last month because she had the hematuria as well. Resolved following treatment and returned yesterday. Seen by PCP yesterday and told she had a yeast infection.   Past Medical History:  Diagnosis Date  . Anemia   . Anxiety   . Arthritis   . Diverticulosis   . Fibromyalgia   . Hiatal hernia   . High cholesterol   . History of degenerative disc disease   . Hypertension   . PNA (pneumonia)   . Thyroid disease     Patient Active Problem List   Diagnosis Date Noted  . Dyspnea on exertion 06/25/2016  . Fibromyalgia 05/23/2016  . 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 Hypertension 02/10/2016  . High cholesterol 02/10/2016  . Hiatal hernia 02/10/2016    Past Surgical History:  Procedure Laterality Date  . arm surgery Left   . BREAST REDUCTION SURGERY Bilateral   . REPLACEMENT TOTAL KNEE Left   . TOTAL SHOULDER REPLACEMENT Right     OB History    No data available       Home Medications    Prior to Admission medications   Medication Sig Start Date End Date Taking?  Authorizing Provider  ADVAIR DISKUS 250-50 MCG/DOSE AEPB Inhale 1 puff into the lungs 2 (two) times daily. 06/06/16  Yes Historical Provider, MD  albuterol (PROVENTIL HFA;VENTOLIN HFA) 108 (90 Base) MCG/ACT inhaler Inhale 1-2 puffs into the lungs every 6 (six) hours as needed for wheezing. 05/20/16  Yes Larene Pickett, PA-C  atorvastatin (LIPITOR) 40 MG tablet Take 40 mg by mouth every evening.    Yes Historical Provider, MD  Biotin 1 MG CAPS Take 1 mg by mouth daily.   Yes Historical Provider, MD  celecoxib (CELEBREX) 200 MG capsule Take 1 capsule (200 mg total) by mouth 2 (two) times daily as needed for moderate pain. Needs office visit for any additional refills Patient taking differently: Take 200 mg by mouth daily. Needs office visit for any additional refills 06/11/16  Yes [name], MD  cholecalciferol (VITAMIN D) 400 units TABS tablet Take 400 Units by mouth.   Yes Historical Provider, MD  cycloSPORINE (RESTASIS) 0.05 % ophthalmic emulsion 1 drop 2 (two) times daily.   Yes Historical Provider, MD  DULoxetine (CYMBALTA) 30 MG capsule Take 1-2 capsules (30-60 mg total) by mouth 2 (two) times daily. Pt takes two capsules in the morning and one capsule in the afternoon. 06/28/16  Yes Chelle Jeffery, PA-C  famotidine (PEPCID) 10 MG tablet Take 10 mg by mouth at bedtime.   Yes Historical Provider, MD  fluticasone (FLONASE) 50 MCG/ACT nasal spray Place 1-2 sprays into both nostrils daily  as needed for rhinitis.   Yes Historical Provider, MD  ibuprofen (ADVIL,MOTRIN) 200 MG tablet Take 200 mg by mouth every 6 (six) hours as needed.   Yes Historical Provider, MD  levothyroxine (SYNTHROID, LEVOTHROID) 75 MCG tablet Take 75 mcg by mouth daily before breakfast.   Yes Historical Provider, MD  metoprolol succinate (TOPROL-XL) 100 MG 24 hr tablet Take 100 mg by mouth daily. Take with or immediately following a meal.   Yes Historical Provider, MD  Multiple Vitamin (ONE-A-DAY 55 PLUS PO) Take 1 tablet by mouth  daily.   Yes Historical Provider, MD  NEXIUM 40 MG capsule Take 1 capsule (40 mg total) by mouth daily. 06/11/16  Yes [name], MD  pregabalin (LYRICA) 150 MG capsule Take 150 mg by mouth 2 (two) times daily.   Yes Historical Provider, MD  traZODone (DESYREL) 50 MG tablet Take 50 mg by mouth at bedtime as needed for sleep.   Yes Historical Provider, MD  nitrofurantoin, macrocrystal-monohydrate, (MACROBID) 100 MG capsule Take 1 capsule (100 mg total) by mouth 2 (two) times daily. 07/05/16   Fatima Blank, MD    Family History Family History  Problem Relation Age of Onset  . Diabetes Mother   . Heart disease Mother   . Diabetes Father   . Heart disease Father   . Diabetes Sister   . Diabetes Brother     x3  . Colon cancer Neg Hx   . Esophageal cancer Neg Hx   . Stomach cancer Neg Hx   . Pancreatic cancer Neg Hx     Social History Social History  Substance Use Topics  . Smoking status: Former Smoker    Packs/day: 0.50    Years: 46.00    Types: Cigarettes    Quit date: 03/05/2004  . Smokeless tobacco: Never Used  . Alcohol use No     Allergies   Patient has no known allergies.   Review of Systems Review of Systems  Respiratory: Negative for shortness of breath.   Cardiovascular: Negative for chest pain.  Gastrointestinal: Positive for abdominal pain (right flank pain; sporadic, sharp and shooting, lasting only seconds. currently pain free.).  Genitourinary: Positive for hematuria.  Neurological: Negative for headaches.  All other systems are reviewed and are negative for acute change except as noted in the HPI    Physical Exam Updated Vital Signs BP (!) 149/85 (BP Location: Left Arm)   Pulse 72   Temp 98.7 F (37.1 C) (Oral)   SpO2 98%   Physical Exam  Constitutional: She is oriented to person, place, and time. She appears well-developed and well-nourished. No distress.  HENT:  Head: Normocephalic and atraumatic.  Nose: Nose normal.  Eyes: Conjunctivae  and EOM are normal. Pupils are equal, round, and reactive to light. Right eye exhibits no discharge. Left eye exhibits no discharge. No scleral icterus.  Neck: Normal range of motion. Neck supple.  Cardiovascular: Normal rate and regular rhythm.  Exam reveals no gallop and no friction rub.   No murmur heard. Pulmonary/Chest: Effort normal and breath sounds normal. No stridor. No respiratory distress. She has no rales.  Abdominal: Soft. She exhibits no distension. There is no tenderness.  Musculoskeletal: She exhibits no edema or tenderness.  Neurological: She is alert and oriented to person, place, and time.  Skin: Skin is warm and dry. No rash noted. She is not diaphoretic. No erythema.  Psychiatric: She has a normal mood and affect.  Vitals reviewed.    ED  Treatments / Results  Labs (all labs ordered are listed, but only abnormal results are displayed) Labs Reviewed  BASIC METABOLIC PANEL - Abnormal; Notable for the following:       Result Value   Glucose, Bld 134 (*)    All other components within normal limits  URINALYSIS, ROUTINE W REFLEX MICROSCOPIC - Abnormal; Notable for the following:    Color, Urine RED (*)    APPearance CLOUDY (*)    Hgb urine dipstick LARGE (*)    Protein, ur 100 (*)    Nitrite POSITIVE (*)    All other components within normal limits  URINE CULTURE  CBC WITH DIFFERENTIAL/PLATELET    EKG  EKG Interpretation None       Radiology No results found.  Procedures Procedures (including critical care time)  Medications Ordered in ED Medications - No data to display   Initial Impression / Assessment and Plan / ED Course  I have reviewed the triage vital signs and the nursing notes.  Pertinent labs & imaging results that were available during my care of the patient were reviewed by me and considered in my medical decision making (see chart for details).     Recurrent hematuria. Her renal function is intact. UA confirming hematuria and also  noted to have positive nitrites with to numerous to count white blood cells. We'll treat for possible infectious source. Patient unsure of what her previous antibiotic was. Her PCP mention possible urologic follow-up for further evaluation. We'll provide the patient with contact information to urology to set up an appointment.  Safe for discharge with strict return precautions. Close PCP follow-up and urology follow-up.  Final Clinical Impressions(s) / ED Diagnoses   Final diagnoses:  Hemorrhagic cystitis   Disposition: Discharge  Condition: Good  I have discussed the results, Dx and Tx plan with the patient who expressed understanding and agree(s) with the plan. Discharge instructions discussed at great length. The patient was given strict return precautions who verbalized understanding of the instructions. No further questions at time of discharge.    New Prescriptions   NITROFURANTOIN, MACROCRYSTAL-MONOHYDRATE, (MACROBID) 100 MG CAPSULE    Take 1 capsule (100 mg total) by mouth 2 (two) times daily.    Follow Up: [name], MD [address] [place] [address] [phone]  Schedule an appointment as soon as possible for a visit  in 5-7 days, If symptoms do not improve or  worsen  Cleon Gustin, MD [place] [address] [phone]  Schedule an appointment as soon as possible for a visit  For close follow up to assess for hematuria      Fatima Blank, MD 07/05/16 1329

## 2016-07-07 LAB — URINE CULTURE

## 2016-07-13 ENCOUNTER — Encounter: Payer: Self-pay | Admitting: Family Medicine

## 2016-07-13 ENCOUNTER — Ambulatory Visit (INDEPENDENT_AMBULATORY_CARE_PROVIDER_SITE_OTHER): Payer: Medicare Other | Admitting: Family Medicine

## 2016-07-13 ENCOUNTER — Ambulatory Visit (INDEPENDENT_AMBULATORY_CARE_PROVIDER_SITE_OTHER): Payer: Medicare Other

## 2016-07-13 VITALS — BP 167/79 | HR 65 | Temp 98.3°F | Resp 17 | Ht 61.0 in | Wt 154.0 lb

## 2016-07-13 DIAGNOSIS — R1031 Right lower quadrant pain: Secondary | ICD-10-CM | POA: Diagnosis not present

## 2016-07-13 DIAGNOSIS — R310 Gross hematuria: Secondary | ICD-10-CM

## 2016-07-13 DIAGNOSIS — E118 Type 2 diabetes mellitus with unspecified complications: Secondary | ICD-10-CM

## 2016-07-13 DIAGNOSIS — E039 Hypothyroidism, unspecified: Secondary | ICD-10-CM

## 2016-07-13 LAB — POCT URINALYSIS DIP (MANUAL ENTRY)
Bilirubin, UA: NEGATIVE
GLUCOSE UA: NEGATIVE mg/dL
Ketones, POC UA: NEGATIVE mg/dL
Leukocytes, UA: NEGATIVE
Nitrite, UA: NEGATIVE
PH UA: 6 (ref 5.0–8.0)
Protein Ur, POC: 100 mg/dL — AB
SPEC GRAV UA: 1.015 (ref 1.010–1.025)
UROBILINOGEN UA: 0.2 U/dL

## 2016-07-13 LAB — POC MICROSCOPIC URINALYSIS (UMFC): Mucus: ABSENT

## 2016-07-13 LAB — POCT CBC
Granulocyte percent: 60.8 %G (ref 37–80)
HCT, POC: 40.6 % (ref 37.7–47.9)
HEMOGLOBIN: 13.7 g/dL (ref 12.2–16.2)
LYMPH, POC: 3 (ref 0.6–3.4)
MCH, POC: 28.1 pg (ref 27–31.2)
MCHC: 33.7 g/dL (ref 31.8–35.4)
MCV: 83.6 fL (ref 80–97)
MID (cbc): 0.4 (ref 0–0.9)
MPV: 8.3 fL (ref 0–99.8)
PLATELET COUNT, POC: 209 10*3/uL (ref 142–424)
POC Granulocyte: 5.3 (ref 2–6.9)
POC LYMPH PERCENT: 34.1 %L (ref 10–50)
POC MID %: 5.1 %M (ref 0–12)
RBC: 4.86 M/uL (ref 4.04–5.48)
RDW, POC: 14.8 %
WBC: 8.7 10*3/uL (ref 4.6–10.2)

## 2016-07-13 LAB — POCT WET + KOH PREP
TRICH BY WET PREP: ABSENT
YEAST BY KOH: ABSENT
YEAST BY WET PREP: ABSENT

## 2016-07-13 LAB — POC HEMOCCULT BLD/STL (OFFICE/1-CARD/DIAGNOSTIC): Fecal Occult Blood, POC: NEGATIVE

## 2016-07-13 LAB — POCT SEDIMENTATION RATE: POCT SED RATE: 17 mm/h (ref 0–22)

## 2016-07-13 LAB — GLUCOSE, POCT (MANUAL RESULT ENTRY): POC Glucose: 73 mg/dl (ref 70–99)

## 2016-07-13 MED ORDER — TRAMADOL HCL 50 MG PO TABS: 50.0000 mg | ORAL_TABLET | Freq: Three times a day (TID) | ORAL | 0 refills | Status: DC | PRN

## 2016-07-13 MED ORDER — CIPROFLOXACIN HCL 500 MG PO TABS
500.0000 mg | ORAL_TABLET | Freq: Two times a day (BID) | ORAL | 0 refills | Status: DC
Start: 2016-07-13 — End: 2016-07-26

## 2016-07-13 MED ORDER — DULOXETINE HCL 30 MG PO CPEP: ORAL_CAPSULE | ORAL | 2 refills | Status: DC

## 2016-07-13 NOTE — Patient Instructions (Addendum)
     IF you received an x-ray today, you will receive an invoice from [facility]. Please contact [facility] at [phone] with questions or concerns regarding your invoice.   IF you received labwork today, you will receive an invoice from [facility]. Please contact [facility] at [phone] with questions or concerns regarding your invoice.   Our billing staff will not be able to assist you with questions regarding bills from these companies.  You will be contacted with the lab results as soon as they are available. The fastest way to get your results is to activate your My Chart account. Instructions are located on the last page of this paperwork. If you have not heard from us regarding the results in 2 weeks, please contact this office.      Hematuria, Adult Hematuria is blood in your urine. It can be caused by a bladder infection, kidney infection, prostate infection, kidney stone, or cancer of your urinary tract. Infections can usually be treated with medicine, and a kidney stone usually will pass through your urine. If neither of these is the cause of your hematuria, further workup to find out the reason may be needed. It is very important that you tell your health care provider about any blood you see in your urine, even if the blood stops without treatment or happens without causing pain. Blood in your urine that happens and then stops and then happens again can be a symptom of a very serious condition. Also, pain is not a symptom in the initial stages of many urinary cancers. Follow these instructions at home:  Drink lots of fluid, 3-4 quarts a day. If you have been diagnosed with an infection, cranberry juice is especially recommended, in addition to large amounts of water.  Avoid caffeine, tea, and carbonated beverages because they tend to irritate the bladder.  Avoid alcohol because it may irritate the prostate.  Take all medicines as directed by your health  care provider.  If you were prescribed an antibiotic medicine, finish it all even if you start to feel better.  If you have been diagnosed with a kidney stone, follow your health care provider's instructions regarding straining your urine to catch the stone.  Empty your bladder often. Avoid holding urine for long periods of time.  After a bowel movement, women should cleanse front to back. Use each tissue only once.  Empty your bladder before and after sexual intercourse if you are a female. Contact a health care provider if:  You develop back pain.  You have a fever.  You have a feeling of sickness in your stomach (nausea) or vomiting.  Your symptoms are not better in 3 days. Return sooner if you are getting worse. Get help right away if:  You develop severe vomiting and are unable to keep the medicine down.  You develop severe back or abdominal pain despite taking your medicines.  You begin passing a large amount of blood or clots in your urine.  You feel extremely weak or faint, or you pass out. This information is not intended to replace advice given to you by your health care provider. Make sure you discuss any questions you have with your health care provider. Document Released: 02/19/2005 Document Revised: 07/28/2015 Document Reviewed: 10/20/2012 Elsevier Interactive Patient Education  2017 Elsevier Inc.  

## 2016-07-13 NOTE — Progress Notes (Signed)
By signing my name below, I, [name], attest that this documentation has been prepared under the direction and in the presence of [name], MD.  Electronically Signed: [name], Medical Scribe. 07/13/16. 3:52 PM.  Subjective:    Patient ID: Isabel Collins, female    DOB: [birth year], 76 y.o.   MRN: [id number]  HPI Chief Complaint  Patient presents with  . Follow-up    HPI Comments: Isabel Collins is a 76 y.o. female who presents to Primary Care at [facility] complaining of hematuria. Pt was seen in the ED 1 week ago for the similar sxs in addition to right flank pain. She had been have recurrent episodes over the last month, which are relieved with abx. She was treated with nitrofurantoin 5/3/18and was on keflex 06/04/16. Urine culture came back contaminated. New diagnosis of DM 6 weeks ago, Hgb A1c 7.1. Pt was unable to be contacted to discuss this.   Reports associated sxs of right flank pain, slow urination onset +1 years, forced urination onset +1 years, foamy urine, "terrible bowels", bloody stool, and experiencing fatigue "all the time". Pt plans on going to the GI 08/07/16 for a colonoscopy. Pt states she was informed about her DM. Pt continues to take celebrex. Denies urinary frequency, and vaginal discharge.    Patient Active Problem List   Diagnosis Date Noted  . Dyspnea on exertion 06/25/2016  . Fibromyalgia 05/23/2016  . 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 Hypertension 02/10/2016  . High cholesterol 02/10/2016  . Hiatal hernia 02/10/2016   Past Medical History:  Diagnosis Date  . Anemia   . Anxiety   . Arthritis   . Diverticulosis   . Fibromyalgia   . Hiatal hernia   . High cholesterol   . History of degenerative disc disease   . Hypertension   . PNA (pneumonia)   . Thyroid disease    Past Surgical History:  Procedure Laterality Date  . arm surgery Left   . BREAST REDUCTION SURGERY Bilateral   .  REPLACEMENT TOTAL KNEE Left   . TOTAL SHOULDER REPLACEMENT Right    No Known Allergies Prior to Admission medications   Medication Sig Start Date End Date Taking? Authorizing Provider  ADVAIR DISKUS 250-50 MCG/DOSE AEPB Inhale 1 puff into the lungs 2 (two) times daily. 06/06/16   [provider]  albuterol (PROVENTIL HFA;VENTOLIN HFA) 108 (90 Base) MCG/ACT inhaler Inhale 1-2 puffs into the lungs every 6 (six) hours as needed for wheezing. 05/20/16   Larene Pickett, PA-C  atorvastatin (LIPITOR) 40 MG tablet Take 40 mg by mouth every evening.     [provider]  Biotin 1 MG CAPS Take 1 mg by mouth daily.    [provider]  celecoxib (CELEBREX) 200 MG capsule Take 1 capsule (200 mg total) by mouth 2 (two) times daily as needed for moderate pain. Needs office visit for any additional refills Patient taking differently: Take 200 mg by mouth daily. Needs office visit for any additional refills 06/11/16   [name], MD  cholecalciferol (VITAMIN D) 400 units TABS tablet Take 400 Units by mouth.    [provider]  cycloSPORINE (RESTASIS) 0.05 % ophthalmic emulsion 1 drop 2 (two) times daily.    [provider]  DULoxetine (CYMBALTA) 30 MG capsule Take 1-2 capsules (30-60 mg total) by mouth 2 (two) times daily. Pt takes two capsules in the morning and one capsule in the afternoon. 06/28/16   Harrison Mons,  PA-C  famotidine (PEPCID) 10 MG tablet Take 10 mg by mouth at bedtime.    [provider]  fluticasone (FLONASE) 50 MCG/ACT nasal spray Place 1-2 sprays into both nostrils daily as needed for rhinitis.    [provider]  ibuprofen (ADVIL,MOTRIN) 200 MG tablet Take 200 mg by mouth every 6 (six) hours as needed.    [provider]  levothyroxine (SYNTHROID, LEVOTHROID) 75 MCG tablet Take 75 mcg by mouth daily before breakfast.    [provider]  metoprolol succinate (TOPROL-XL) 100 MG 24 hr tablet Take 100 mg by mouth  daily. Take with or immediately following a meal.    [provider]  Multiple Vitamin (ONE-A-DAY 55 PLUS PO) Take 1 tablet by mouth daily.    [provider]  NEXIUM 40 MG capsule Take 1 capsule (40 mg total) by mouth daily. 06/11/16   [name], MD  nitrofurantoin, macrocrystal-monohydrate, (MACROBID) 100 MG capsule Take 1 capsule (100 mg total) by mouth 2 (two) times daily. 07/05/16   Fatima Blank, MD  pregabalin (LYRICA) 150 MG capsule Take 150 mg by mouth 2 (two) times daily.    [provider]  traZODone (DESYREL) 50 MG tablet Take 50 mg by mouth at bedtime as needed for sleep.    [provider]   Social History   Social History  . Marital status: Divorced    Spouse name: N/A  . Number of children: N/A  . Years of education: N/A   Occupational History  . Not on file.   Social History Main Topics  . Smoking status: Former Smoker    Packs/day: 0.50    Years: 46.00    Types: Cigarettes    Quit date: 03/05/2004  . Smokeless tobacco: Never Used  . Alcohol use No  . Drug use: No  . Sexual activity: Not on file   Other Topics Concern  . Not on file   Social History Narrative  . No narrative on file   Review of Systems  Constitutional: Positive for fatigue.  Gastrointestinal: Positive for blood in stool.  Genitourinary: Positive for difficulty urinating, flank pain and hematuria. Negative for frequency and vaginal discharge.   Objective:  Physical Exam  Constitutional: She appears well-developed and well-nourished. No distress.  HENT:  Head: Normocephalic and atraumatic.  Eyes: Conjunctivae are normal.  Neck: Neck supple.  Cardiovascular: Normal rate, regular rhythm and normal heart sounds.  Exam reveals no gallop and no friction rub.   No murmur heard. Pulmonary/Chest: Effort normal and breath sounds normal. No respiratory distress. She has no wheezes. She has no rales.  Abdominal: Bowel sounds are normal. There is no rebound  and no guarding.  Tenderness and question of fullness over the mid right abdomen No referred pain Positive right flank pain  Genitourinary:  Genitourinary Comments: Nl labia, urethra Vaginal mucosa thinned with slight erythema Cervix surgically absent Does have some mild to moderate vaginal prolapse with valsalva Rectal exam nl Small amount of tiny soft stool pieces  Neurological: She is alert.  Skin: Skin is warm and dry.  Psychiatric: She has a normal mood and affect. Her behavior is normal.  Nursing note and vitals reviewed.   Vitals:   07/13/16 1502  BP: (!) 167/79  Pulse: 65  Resp: 17  Temp: 98.3 F (36.8 C)  TempSrc: Oral  SpO2: 97%  Weight: 154 lb (69.9 kg)  Height: 5\' 1"  (1.549 m)   Body mass index is 29.1 kg/m.  Results for orders placed or performed in visit on 07/13/16  POCT urinalysis dipstick  Result Value Ref Range   Color, UA brown (A) yellow   Clarity, UA clear clear   Glucose, UA negative negative mg/dL   Bilirubin, UA negative negative   Ketones, POC UA negative negative mg/dL   Spec Grav, UA 1.015 1.010 - 1.025   Blood, UA large (A) negative   pH, UA 6.0 5.0 - 8.0   Protein Ur, POC =100 (A) negative mg/dL   Urobilinogen, UA 0.2 0.2 or 1.0 E.U./dL   Nitrite, UA Negative Negative   Leukocytes, UA Negative Negative  POCT Microscopic Urinalysis ([facility])  Result Value Ref Range   WBC,UR,HPF,POC Moderate (A) None WBC/hpf   RBC,UR,HPF,POC Too numerous to count  (A) None RBC/hpf   Bacteria None None, Too numerous to count   Mucus Absent Absent   Epithelial Cells, UR Per Microscopy Moderate (A) None, Too numerous to count cells/hpf   Dg Abd 1 View  Result Date: 07/13/2016 CLINICAL DATA:  Gross hematuria. EXAM: ABDOMEN - 1 VIEW COMPARISON:  None. FINDINGS: The bowel gas pattern is normal. No radiopaque stones are present. Surgical clips are present at the gallbladder fossa. There is scoliosis of the lumbar spine, convex to the right at L4-5 and to the  left at L2. IMPRESSION: 1. No radiopaque stone identified to explain hematuria. 2. Scoliosis. 3. Normal bowel gas pattern. Electronically Signed   By: San Morelle M.D.   On: 07/13/2016 16:13   Assessment & Plan:  Pt needs a walker and a home health aid.- will send a rx into a DME co and a referral.  1. Gross hematuria - no obvious etiology on exam, will cover again for UTI this time with cipro (has not been given in Epic) as sxs have improved with Keflex 1 mo prior then macrobid last wk.  Needs abd/pelvic CT scan to eval for poss renal/bladder etiologies - I'm most concerned about poss cancer though of course need to r/o stone or other inflammation. Stop nsaids and celebrex.   Refer to urology.  2. Acquired hypothyroidism   3. Type 2 diabetes mellitus with complication, without long-term current use of insulin (HCC) - a1c 7.1 last mo. Pt states she was aware of DM diagnosis. Not currently on med - I am hesitant to start until after we resolve the gross hematuria  4. Right lower quadrant abdominal pain   5.      Scoliosis/chronic back pain - no nsaids/celebrex so will try tramadol - if does not work, can call in tylenol #3 until after CT scan.  Pt states she has a new phone and we will be able to get a hold of her to arrange imaging and referrals. Her new number is [phone] Orders Placed This Encounter  Procedures  . Urine culture  . DG Abd 1 View    Standing Status:   Future    Number of Occurrences:   1    Standing Expiration Date:   07/13/2017    Order Specific Question:   Reason for Exam (SYMPTOM  OR DIAGNOSIS REQUIRED)    Answer:   gross hematuria, check for kidney stone    Order Specific Question:   Preferred imaging location?    Answer:   External  . CT Abdomen Pelvis Wo Contrast    Standing Status:   Future    Standing Expiration Date:   10/13/2017    Order Specific Question:   Reason for Exam (SYMPTOM  OR DIAGNOSIS REQUIRED)    Answer:   recurrent right flank and right  abd pain and fullness with gross hematuria x 2 mos, concern for renal mass    Order Specific Question:   Preferred imaging location?    Answer:   [facility]    Order Specific Question:   Radiology Contrast Protocol - do NOT remove file path    Answer:   [email or web address]  . Comprehensive metabolic panel  . TSH  . POCT urinalysis dipstick  . POCT Microscopic Urinalysis ([facility])  . POCT CBC  . POCT SEDIMENTATION RATE  . POCT glucose (manual entry)  . POC Hemoccult Bld/Stl (1-Cd Office Dx)  . POCT Wet + KOH Prep    Meds ordered this encounter  Medications  . traMADol (ULTRAM) 50 MG tablet    Sig: Take 1 tablet (50 mg total) by mouth every 8 (eight) hours as needed.    Dispense:  30 tablet    Refill:  0  . ciprofloxacin (CIPRO) 500 MG tablet    Sig: Take 1 tablet (500 mg total) by mouth 2 (two) times daily.    Dispense:  14 tablet    Refill:  0  . DULoxetine (CYMBALTA) 30 MG capsule    Sig: Take two capsules po in the morning and one capsule po in the afternoon.    Dispense:  90 capsule    Refill:  2   Over 40 min spent in face-to-face evaluation of and consultation with patient and coordination of care.  Over 50% of this time was spent counseling this patient.  I personally performed the services described in this documentation, which was scribed in my presence. The recorded information has been reviewed and considered, and addended by me as needed.   [name], M.D.  Primary Care at [facility] [address] [place], [place] [address] [phone] phone [phone] fax  07/13/16 10:07 PM

## 2016-07-14 LAB — COMPREHENSIVE METABOLIC PANEL
A/G RATIO: 2.1 (ref 1.2–2.2)
ALBUMIN: 4.4 g/dL (ref 3.5–4.8)
ALT: 15 IU/L (ref 0–32)
AST: 23 IU/L (ref 0–40)
Alkaline Phosphatase: 92 IU/L (ref 39–117)
BILIRUBIN TOTAL: 0.3 mg/dL (ref 0.0–1.2)
BUN / CREAT RATIO: 11 — AB (ref 12–28)
BUN: 9 mg/dL (ref 8–27)
CHLORIDE: 101 mmol/L (ref 96–106)
CO2: 25 mmol/L (ref 18–29)
Calcium: 9.5 mg/dL (ref 8.7–10.3)
Creatinine, Ser: 0.81 mg/dL (ref 0.57–1.00)
GFR calc non Af Amer: 71 mL/min/{1.73_m2} (ref >59)
GFR, EST AFRICAN AMERICAN: 82 mL/min/{1.73_m2} (ref >59)
GLUCOSE: 71 mg/dL (ref 65–99)
Globulin, Total: 2.1 g/dL (ref 1.5–4.5)
POTASSIUM: 3.8 mmol/L (ref 3.5–5.2)
SODIUM: 142 mmol/L (ref 134–144)
Total Protein: 6.5 g/dL (ref 6.0–8.5)

## 2016-07-14 LAB — URINE CULTURE

## 2016-07-14 LAB — TSH: TSH: 3.29 u[IU]/mL (ref 0.450–4.500)

## 2016-07-16 ENCOUNTER — Other Ambulatory Visit: Payer: Self-pay | Admitting: Family Medicine

## 2016-07-17 ENCOUNTER — Telehealth: Payer: Self-pay | Admitting: Family Medicine

## 2016-07-17 DIAGNOSIS — R310 Gross hematuria: Secondary | ICD-10-CM

## 2016-07-17 NOTE — Telephone Encounter (Signed)
[facility] calling asking if the CT abdomen pelvis W/O contrast should be changed to W contrast. They said if the renal mass was a big concern we may want to change it but if it is not a huge concern they could leave it. Please advise. If changed, [facility] can be contacted at [phone]. Thanks!

## 2016-07-18 NOTE — Telephone Encounter (Signed)
[facility] called following up on orders for pt CT scheduled for tomorrow. Wanted to know if we wanted to change to W/ Contrast. Please advise. Thanks!

## 2016-07-18 NOTE — Telephone Encounter (Addendum)
Yes, please change to WITH contrast. New order entered. Thanks.

## 2016-07-19 ENCOUNTER — Encounter: Payer: Self-pay | Admitting: Radiology

## 2016-07-19 ENCOUNTER — Ambulatory Visit
Admission: RE | Admit: 2016-07-19 | Discharge: 2016-07-19 | Disposition: A | Discharge status: Home / Self Care | Payer: Medicare Other | Source: Ambulatory Visit | Attending: Family Medicine | Admitting: Family Medicine

## 2016-07-19 ENCOUNTER — Other Ambulatory Visit: Payer: Self-pay | Admitting: Family Medicine

## 2016-07-19 DIAGNOSIS — R310 Gross hematuria: Secondary | ICD-10-CM

## 2016-07-19 DIAGNOSIS — R1031 Right lower quadrant pain: Secondary | ICD-10-CM

## 2016-07-19 MED ORDER — IOPAMIDOL (ISOVUE-300) INJECTION 61%
125.0000 mL | Freq: Once | INTRAVENOUS | Status: AC | PRN
  Administered 2016-07-19: 125 mL via INTRAVENOUS

## 2016-07-19 NOTE — Telephone Encounter (Signed)
Radiology called. For hematuria protocol may need with and without. Order entered.

## 2016-07-20 ENCOUNTER — Other Ambulatory Visit: Payer: Self-pay | Admitting: Family Medicine

## 2016-07-20 ENCOUNTER — Other Ambulatory Visit: Payer: Self-pay

## 2016-07-20 DIAGNOSIS — R310 Gross hematuria: Secondary | ICD-10-CM

## 2016-07-20 DIAGNOSIS — D49519 Neoplasm of unspecified behavior of unspecified kidney: Secondary | ICD-10-CM

## 2016-07-20 DIAGNOSIS — C641 Malignant neoplasm of right kidney, except renal pelvis: Secondary | ICD-10-CM

## 2016-07-20 MED ORDER — ACETAMINOPHEN-CODEINE #3 300-30 MG PO TABS: 1.0000 | ORAL_TABLET | ORAL | 0 refills | Status: DC | PRN

## 2016-07-20 NOTE — Telephone Encounter (Signed)
tyl # [facility]

## 2016-07-26 ENCOUNTER — Encounter: Payer: Self-pay | Admitting: Family Medicine

## 2016-07-26 ENCOUNTER — Ambulatory Visit (INDEPENDENT_AMBULATORY_CARE_PROVIDER_SITE_OTHER): Payer: Medicare Other | Admitting: Family Medicine

## 2016-07-26 VITALS — BP 125/58 | HR 65 | Temp 97.5°F | Resp 18 | Ht 62.0 in | Wt 154.4 lb

## 2016-07-26 DIAGNOSIS — R109 Unspecified abdominal pain: Secondary | ICD-10-CM

## 2016-07-26 DIAGNOSIS — R310 Gross hematuria: Secondary | ICD-10-CM

## 2016-07-26 DIAGNOSIS — R51 Headache: Secondary | ICD-10-CM | POA: Diagnosis not present

## 2016-07-26 DIAGNOSIS — R519 Headache, unspecified: Secondary | ICD-10-CM

## 2016-07-26 DIAGNOSIS — G8929 Other chronic pain: Secondary | ICD-10-CM

## 2016-07-26 LAB — POCT URINALYSIS DIP (MANUAL ENTRY)
Glucose, UA: NEGATIVE mg/dL
LEUKOCYTES UA: NEGATIVE
NITRITE UA: NEGATIVE
PH UA: 6 (ref 5.0–8.0)
Protein Ur, POC: 100 mg/dL — AB
Spec Grav, UA: 1.025 (ref 1.010–1.025)
Urobilinogen, UA: 0.2 E.U./dL

## 2016-07-26 MED ORDER — HYDROCODONE-ACETAMINOPHEN 5-325 MG PO TABS: 1.0000 | ORAL_TABLET | ORAL | 0 refills | Status: DC | PRN

## 2016-07-26 MED ORDER — DULOXETINE HCL 30 MG PO CPEP: ORAL_CAPSULE | ORAL | 1 refills | Status: DC

## 2016-07-26 NOTE — Progress Notes (Signed)
Subjective:    Patient ID: Isabel Collins, female    DOB: May 03, 1940, 76 y.o.   MRN: [id number] Chief Complaint  Patient presents with  . Follow-up    07/13/2016 hematuria  . Medication Refill    Cymbalta 60 mg in the AM    HPI   Quit smoking in 2006  Does not stay on the Advair - just uses prn and not in the last several week but she does stay on the flonase which helps as well as well as the restasis for her eyes.  The tylenol #3 helped a little for her right flank pain radiating anteriorly. She denies seeing any more blood in her urine but notes that she sees little pieces of tissue in the urine floating at the bottom and has a pink ring around her toilet. Ibuprofen no longer working.  Needs a dentist to cut out teeth - maxiallofacial surgery. The gums are growing. Wants to go somewhere close to her place Needs optho referral.  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 Put on blind on her SS.  Has glasses which broke and has not had vision checked in forever.  Stays on nexium or pepcid to treat heart burn.  She sleeps well and so rarely uses the trazodone. Occasioannly she will feel a flutter in her chest/throat and will occ feel a choking sensation along with that - happens most when she is laying down at night - feels like "it wants to clog up." No dysphagia. No radiation. Occ has to clear it but she doesn't know how - she does not mean by clearing her throat though - just like getting phlegm stuck in her throat. No nausea, no gerd.  She does vomit some due to [facility] and feels better when she lays flat to stretch it out.  Occ wheezing.   Stays on dulcolax and prune juice to keep her bowels normal.   Past Medical History:  Diagnosis Date  . Anemia   . Anxiety   . Arthritis   . Diverticulosis   . Fibromyalgia   . Hiatal hernia   . High cholesterol   . History of degenerative disc disease   . Hypertension   . PNA (pneumonia)   . Thyroid disease    Past  Surgical History:  Procedure Laterality Date  . arm surgery Left   . BREAST REDUCTION SURGERY Bilateral   . REPLACEMENT TOTAL KNEE Left   . TOTAL SHOULDER REPLACEMENT Right    Current Outpatient Prescriptions on File Prior to Visit  Medication Sig Dispense Refill  . ADVAIR DISKUS 250-50 MCG/DOSE AEPB Inhale 1 puff into the lungs 2 (two) times daily.  1  . albuterol (PROVENTIL HFA;VENTOLIN HFA) 108 (90 Base) MCG/ACT inhaler Inhale 1-2 puffs into the lungs every 6 (six) hours as needed for wheezing. 1 Inhaler 0  . atorvastatin (LIPITOR) 40 MG tablet Take 40 mg by mouth every evening.     . Biotin 1 MG CAPS Take 1 mg by mouth daily.    . cholecalciferol (VITAMIN D) 400 units TABS tablet Take 400 Units by mouth.    . cycloSPORINE (RESTASIS) 0.05 % ophthalmic emulsion 1 drop 2 (two) times daily.    . famotidine (PEPCID) 10 MG tablet Take 10 mg by mouth at bedtime.    . fluticasone (FLONASE) 50 MCG/ACT nasal spray Place 1-2 sprays into both nostrils daily as needed for rhinitis.    Marland Kitchen levothyroxine (SYNTHROID, LEVOTHROID) 75 MCG  tablet Take 1 tablet (75 mcg total) by mouth daily before breakfast. 90 tablet 1  . metoprolol succinate (TOPROL-XL) 100 MG 24 hr tablet Take 100 mg by mouth daily. Take with or immediately following a meal.    . Multiple Vitamin (ONE-A-DAY 55 PLUS PO) Take 1 tablet by mouth daily.    Marland Kitchen NEXIUM 40 MG capsule Take 1 capsule (40 mg total) by mouth daily. 30 capsule 1  . pregabalin (LYRICA) 150 MG capsule Take 150 mg by mouth 2 (two) times daily.    . traZODone (DESYREL) 50 MG tablet Take 50 mg by mouth at bedtime as needed for sleep.     No current facility-administered medications on file prior to visit.    No Known Allergies Family History  Problem Relation Age of Onset  . Diabetes Mother   . Heart disease Mother   . Diabetes Father   . Heart disease Father   . Diabetes Sister   . Diabetes Brother        x3  . Colon cancer Neg Hx   . Esophageal cancer Neg Hx   .  Stomach cancer Neg Hx   . Pancreatic cancer Neg Hx    Social History   Social History  . Marital status: Divorced    Spouse name: N/A  . Number of children: N/A  . Years of education: N/A   Social History Main Topics  . Smoking status: Former Smoker    Packs/day: 0.50    Years: 46.00    Types: Cigarettes    Quit date: 03/05/2004  . Smokeless tobacco: Never Used  . Alcohol use No  . Drug use: No  . Sexual activity: Not Asked   Other Topics Concern  . None   Social History Narrative  . None   Depression screen [facility] 2/9 07/26/2016 07/13/2016 06/04/2016 03/13/2016 02/10/2016  Decreased Interest 0 0 0 0 0  Down, Depressed, Hopeless 0 0 0 0 0  PHQ - 2 Score 0 0 0 0 0    Review of Systems See hpi    Objective:   Physical Exam  Constitutional: She is oriented to person, place, and time. She appears well-developed and well-nourished. No distress.  HENT:  Head: Normocephalic and atraumatic.  Right Ear: External ear normal.  Left Ear: External ear normal.  Eyes: Conjunctivae are normal. No scleral icterus.  Neck: Normal range of motion. Neck supple. No thyromegaly present.  Cardiovascular: Normal rate, regular rhythm, normal heart sounds and intact distal pulses.   Pulmonary/Chest: Effort normal and breath sounds normal. No respiratory distress.  Musculoskeletal: She exhibits no edema.  Lymphadenopathy:    She has no cervical adenopathy.  Neurological: She is alert and oriented to person, place, and time.  Skin: Skin is warm and dry. She is not diaphoretic. No erythema.  Psychiatric: She has a normal mood and affect. Her behavior is normal.       BP (!) 125/58 (BP Location: Right Arm, Patient Position: Sitting, Cuff Size: Normal)   Pulse 65   Temp 97.5 F (36.4 C)   Resp 18   Ht 5\' 2"  (1.575 m)   Wt 154 lb 6.4 oz (70 kg)   SpO2 95%   BMI 28.24 kg/m   Assessment & Plan:   1. Gross hematuria   2. Right flank pain, chronic   3. 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 Failed tramadol and interacts with psych meds so will try low dose opioid but watch use closely as pt does have a sig mental health hx.  Cont cymbalta - reviewed that sig was different (using all 30mg  tabs instead of 1 60mg  and 1 30mg ) but I have not changed her dose at all from what she was already on when she started seeing me.  Of note, pt has almost no social support and no family support (I think family is out of state). She does not drive - takes taxis or scat to her appts, lives alone in appt (but sev mos ago was staying in homeless shelter for a long time.)  Orders Placed This Encounter  Procedures  . Basic metabolic panel    Order Specific Question:   Has the patient fasted?    Answer:   No  . Sedimentation Rate  . C-reactive protein  . Care order/instruction:    Scheduling Instructions:     Complete orders, AVS and go.  Marland Kitchen POCT urinalysis dipstick    Meds ordered this encounter  Medications  . HYDROcodone-acetaminophen (NORCO/VICODIN) 5-325 MG tablet    Sig: Take 1-2 tablets by mouth every 4 (four) hours as needed for moderate pain.    Dispense:  60 tablet    Refill:  0  . DULoxetine (CYMBALTA) 30 MG capsule    Sig: Take two capsules po in the morning and one capsule po in the afternoon.    Dispense:  270 capsule    Refill:  1    [name], M.D.  Primary Care at [facility] [address] [place], [place] [address] [phone] phone [phone] fax  08/07/16 12:02 PM

## 2016-07-26 NOTE — Patient Instructions (Addendum)
IF you received an x-ray today, you will receive an invoice from [facility]. Please contact [facility] at [phone] with questions or concerns regarding your invoice.   IF you received labwork today, you will receive an invoice from [facility]. Please contact [facility] at [phone] with questions or concerns regarding your invoice.   Our billing staff will not be able to assist you with questions regarding bills from these companies.  You will be contacted with the lab results as soon as they are available. The fastest way to get your results is to activate your My Chart account. Instructions are located on the last page of this paperwork. If you have not heard from [place] regarding the results in 2 weeks, please contact this office.     Bladder Cancer Bladder cancer is an abnormal growth of tissue in the bladder. The bladder is the balloon-like sac in the pelvis. It collects and stores urine that comes from the kidneys through the ureters. The bladder wall is made of layers. If cancer spreads into these layers and through the wall of the bladder, it becomes more difficult to treat. What are the causes? The cause of this condition is not known. What increases the risk? The following factors may make you more likely to develop this condition:  Smoking.  Workplace risks (occupational exposures), such as rubber, leather, textile, dyes, chemicals, and paint.  Being white.  Your age. Most people with bladder cancer are over the age of 67.  Being female.  Having chronic bladder inflammation.  Having a personal history of bladder cancer.  Having a family history of bladder cancer (heredity).  Having had chemotherapy or radiation therapy to the pelvis.  Having been exposed to arsenic. What are the signs or symptoms? Initial symptoms of this condition include:  Blood in the urine.  Painful urination.  Frequent bladder or urine infections.  Increase in  urgency and frequency of urination. Advanced symptoms of this condition include:  Not being able to urinate.  Low back pain on one side.  Loss of appetite.  Weight loss.  Fatigue.  Swelling in the feet.  Bone pain. How is this diagnosed? This condition is diagnosed based on your medical history, a physical exam, urine tests, lab tests, imaging tests, and your symptoms. You may also have other tests or procedures done, such as:  A narrow tube being inserted into your bladder through your urethra (cystoscopy) in order to view the lining of your bladder for tumors.  A biopsy to sample the tumor to see if cancer is present. If cancer is present, it will then be staged to determine its severity and extent. Staging is an assessment of:  The size of the tumor.  Whether the cancer has spread.  Where the cancer has spread. It is important to know how deeply into the bladder wall cancer has grown and whether cancer has spread to any other parts of your body. Staging may require blood tests or imaging tests, such as a CT scan, MRI, bone scan, or chest X-ray. How is this treated? Based on the stage of cancer, one treatment or a combination of treatments may be recommended. The most common forms of treatment are:  Surgery to remove the cancer. Procedures that may be done include transurethral resection and cystectomy.  Radiation therapy. This is high-energy X-rays or other particles. This is often used in combination with chemotherapy.  Chemotherapy. During this treatment, medicines are used to kill cancer cells.  Immunotherapy.  This uses medicines to help your own immune system destroy cancer cells. Follow these instructions at home:  Take over-the-counter and prescription medicines only as told by your health care provider.  Maintain a healthy diet. Some of your treatments might affect your appetite.  Consider joining a support group. This may help you learn to cope with the stress  of having bladder cancer.  Tell your cancer care team if you develop side effects. They may be able to recommend ways to relieve them.  Keep all follow-up visits as told by your health care provider. This is important. Where to find more information:  American Cancer Society: www.cancer.[place] www.cancer.gov Contact a health care provider if:  You have symptoms of a urinary tract infection. These include:  Fever.  Chills.  Weakness.  Muscle aches.  Abdominal pain.  Frequent and intense urge to urinate.  Burning feeling in the bladder or urethra during urination. Get help right away if:  There is blood in your urine.  You cannot urinate.  You have severe pain or other symptoms that do not go away. Summary  Bladder cancer is an abnormal growth of tissue in the bladder.  This condition is diagnosed based on your medical history, a physical exam, urine tests, lab tests, imaging tests, and your symptoms.  Based on the stage of cancer, surgery, chemotherapy, or a combination of treatments may be recommended.  Consider joining a support group. This may help you learn to cope with the stress of having bladder cancer. This information is not intended to replace advice given to you by your health care provider. Make sure you discuss any questions you have with your health care provider. Document Released: 02/22/2003 Document Revised: 01/24/2016 Document Reviewed: 01/24/2016 Elsevier Interactive Patient Education  2017 [employer].

## 2016-07-27 LAB — BASIC METABOLIC PANEL
BUN/Creatinine Ratio: 19 (ref 12–28)
BUN: 14 mg/dL (ref 8–27)
CALCIUM: 9.3 mg/dL (ref 8.7–10.3)
CO2: 24 mmol/L (ref 18–29)
CREATININE: 0.74 mg/dL (ref 0.57–1.00)
Chloride: 102 mmol/L (ref 96–106)
GFR calc Af Amer: 91 mL/min/{1.73_m2} (ref >59)
GFR calc non Af Amer: 79 mL/min/{1.73_m2} (ref >59)
GLUCOSE: 68 mg/dL (ref 65–99)
Potassium: 4.2 mmol/L (ref 3.5–5.2)
SODIUM: 142 mmol/L (ref 134–144)

## 2016-07-27 LAB — C-REACTIVE PROTEIN: CRP: 1 mg/L (ref 0.0–4.9)

## 2016-07-27 LAB — SEDIMENTATION RATE: Sed Rate: 7 mm/hr (ref 0–40)

## 2016-07-27 LAB — CYTOLOGY, URINE

## 2016-08-01 ENCOUNTER — Other Ambulatory Visit: Payer: Self-pay | Admitting: Urology

## 2016-08-10 ENCOUNTER — Telehealth: Payer: Self-pay | Admitting: Family Medicine

## 2016-08-10 NOTE — Telephone Encounter (Signed)
No problem but I suspect she will get the same services without a note.  Some private supplemental insurances will pay for a home health aide but a lot won't.   And please change pharm to [facility], thanks.  Fine to write a letter stating: Ms. Tackitt needs a home aide to help her with some of her independent activities of daily living that she is currently having difficulty performing due to her multiple chronic progressive medical conditions including but no limited to: Patient Active Problem List   Diagnosis Date Noted  . Dyspnea on exertion 06/25/2016  . Fibromyalgia 05/23/2016  . 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 Hypertension 02/10/2016  . High cholesterol 02/10/2016  . Hiatal hernia 02/10/2016    Urothelial cancer.  Dr. [name] Primary Care Physician   Also, I highly recommend pt see if she qualifies for a home aide by calling:   [facility] [facility] [phone] NA services 2-3x/wk to assist with bathing, personal care, linen change,, light meal preparation, trash removal. SN annual assessment and Quarterly Supervisory visits. Goal improve and maintain good skin integrity, increase safety with ADLs. Needs to be connected wiht other community services to assist with meeting needs as well.

## 2016-08-10 NOTE — Telephone Encounter (Signed)
Isabel Collins advised and info with letter put in mail to her (confirmed address)

## 2016-08-10 NOTE — Telephone Encounter (Signed)
Will you write this

## 2016-08-10 NOTE — Telephone Encounter (Signed)
PATIENT WOULD LIKE DR. SHAW TO WRITE HER A NOTE TO SAY THAT SHE NEEDS AN AIDE TO HELP HER WITH LIGHT HOUSEKEEPING AT HER HOUSE 2 TO 3 DAYS A WEEK. SHE WILL GIVE IT TO WHATEVER AGENCY SHE DECIDES TO USE. SHE ALSO WOULD LIKE HER CHART UPDATED WITH HER CURRENT PHARMACY. INSTEAD OF USING [facility] [address] SHE WILL USE [facility]. BEST PHONE [phone] (HOME) [place]

## 2016-08-14 ENCOUNTER — Telehealth: Payer: Self-pay | Admitting: Family Medicine

## 2016-08-14 DIAGNOSIS — N2889 Other specified disorders of kidney and ureter: Secondary | ICD-10-CM

## 2016-08-14 NOTE — Telephone Encounter (Signed)
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

## 2016-08-14 NOTE — Telephone Encounter (Signed)
Pt calling wanting Dr Brigitte Pulse to write a letter to the [facility] services/social services department  to get help after the 24th for a nurse to come to her home to take care of her after the surgery and transportation

## 2016-08-15 ENCOUNTER — Other Ambulatory Visit: Payer: Self-pay | Admitting: Physician Assistant

## 2016-08-15 NOTE — Telephone Encounter (Signed)
See last phone note from 6/8 - this letter was written and mailed to pt and pt was informed and her address confirmed.  Who is her case worker at [place] who is wanting info on pt from [place] so that she can get personal care services? we need a name and contact info to do anything else.  Will also place referral to home health for pcs as well though not sure she will be eligible.    Pt needs to contact the medicare/scat transportation to arrange for trip home from the [place].  Often the inpt SW will also be able to help her arrange the above things.  If pt needs anything else, I recommend she have the urology office contact us about what they are doing and what her recovery time will be.  From the current info listed in Epic, it looks like she is just scheduled for cystoscopy/uretoscopy with stent plcmnt for which she really shouldn't need any sig help for recovery - normally this is an outpt procedure and she would be back to normal w/in several days so nothing that would require PCS.  If urology has told her differently, I recommend she ask the urologist for a letter stating how long she is expected to need pcs.  Hmm, not sure about initial message so please just disregard for now.  (Pt only recently began being seen here and we pretty quickly realized that the blood in her urine was not simply due to recurrent infections and found the mass on her kidney on CT scan that I ordered to work this up as well as referred her to urology and oncology.  We discussed that she had a mass which appeared to be a tumor and that she would likely have surgery asap on the phone and at her last two office visits. Marland Kitchen . .)

## 2016-08-16 ENCOUNTER — Encounter (HOSPITAL_BASED_OUTPATIENT_CLINIC_OR_DEPARTMENT_OTHER): Payer: Self-pay | Admitting: *Deleted

## 2016-08-17 ENCOUNTER — Encounter (HOSPITAL_BASED_OUTPATIENT_CLINIC_OR_DEPARTMENT_OTHER): Payer: Self-pay | Admitting: *Deleted

## 2016-08-17 NOTE — Progress Notes (Signed)
Pt called-has searched for someone to be with her post-op-but unable to arrange-no family,few resources for support.Message left with Pam-would Dr Karsten Ro consider keeping as 23 hour observation?

## 2016-08-17 NOTE — Telephone Encounter (Signed)
Referral sent to [facility] on 6/15

## 2016-08-17 NOTE — Progress Notes (Signed)
To [facility] at 0600-Istat on arrival-Ekg ,Cxr with chart.Npo after Mn-will take metoprolol,levothyroxine,nexium with small amt water-to use Advir,flonase .Discussed the need for someone to stay with her or stay with someone post op x 24 hours.Stated she would make arrangements for this.

## 2016-08-20 NOTE — Telephone Encounter (Signed)
Pt notified and verbalized she will call her SW to help with transportation. She will have the caseworker contact us.

## 2016-08-21 ENCOUNTER — Telehealth: Payer: Self-pay | Admitting: Family Medicine

## 2016-08-21 NOTE — Telephone Encounter (Signed)
Pt is needing to make sure that her new pharmacy is on file of [facility]  The phone number for them is [phone]

## 2016-08-26 NOTE — H&P (Signed)
CC: I have a renal mass.  HPI: Isabel Collins is a 76 year-old female patient with a right upper pole collecting system mass.  The mass is on the right side. Her problem began 07/19/2016. Her symptoms include blood in urine. Patient denies having flank pain, back pain, groin pain, nausea, vomiting, fever, and chills. She had the following imaging studies done: CT Scan. She has not had kidney surgery.   She has seen blood in her urine. She is not having new bone pain. She has not recently had unwanted weight loss.   07/31/16: When she was seen on 07/13/16 she was complaining of right lower quadrant pain and gross hematuria. She seemed to note improvement with antibiotic therapy and had been previously treated with nitrofurantoin and Keflex. Cipro was then tried and a culture was obtained that showed contamination only. Her urinalysis at that time had blood but was otherwise not suggestive of a UTI.  A CT scan without contrast was performed on 07/19/16 which revealed an enhancing lesion in the right renal pelvis and upper pole of the right kidney with no evidence of extrarenal extension or adenopathy.  She reports that she has been seeing intermittent gross hematuria since 3/18. For about 3-4 months she said she occasionally will pass what she describes as some pieces of skin or other form of tissue. She has not been having any flank pain but has developed some pain in her lower back on the right-hand side over the past 48 hours.     ALLERGIES: None   MEDICATIONS: Nexium  Atorvastatin Calcium  Lyrica  Symbicort     GU PSH: None   NON-GU PSH: Bilateral Tubal Ligation Knee Arthroscopy/surgery Shoulder Arthroscopy/surgery    GU PMH: Renal cyst, Right - 07/19/2016 Right renal neoplasm      PMH Notes: When she was seen on 07/13/16 she was complaining of right lower quadrant pain and gross hematuria. She seemed to note improvement with antibiotic therapy and had been previously treated with  nitrofurantoin and Keflex. Cipro was then tried and a culture was obtained that showed contamination only. Her urinalysis at that time had blood but was otherwise not suggestive of a UTI.  A CT scan without contrast was performed on 07/19/16 which revealed an enhancing lesion in the right renal pelvis and upper pole of the right kidney with no evidence of extrarenal extension or adenopathy.     NON-GU PMH: Anxiety Arrhythmia Arthritis GERD Hypercholesterolemia    FAMILY HISTORY: 1 Daughter - Runs in Family Heart Disease - Father Kidney Failure - Mother   SOCIAL HISTORY: Marital Status: Divorced Current Smoking Status: Patient does not smoke anymore.   Tobacco Use Assessment Completed: Used Tobacco in last 30 days? Does not drink anymore.  Drinks 3 caffeinated drinks per day.    REVIEW OF SYSTEMS:    GU Review Female:   Patient reports frequent urination, hard to postpone urination, burning /pain with urination, get up at night to urinate, leakage of urine, stream starts and stops, trouble starting your stream, and have to strain to urinate. Patient denies being pregnant.  Gastrointestinal (Upper):   Patient reports indigestion/ heartburn. Patient denies nausea and vomiting.  Gastrointestinal (Lower):   Patient reports constipation. Patient denies diarrhea.  Constitutional:   Patient reports fatigue. Patient denies fever, night sweats, and weight loss.  Skin:   Patient denies skin rash/ lesion and itching.  Eyes:   Patient reports blurred vision. Patient denies double vision.  Ears/ Nose/ Throat:  Patient reports sinus problems. Patient denies sore throat.  Hematologic/Lymphatic:   Patient reports easy bruising. Patient denies swollen glands.  Cardiovascular:   Patient denies chest pains and leg swelling.  Respiratory:   Patient reports shortness of breath. Patient denies cough.  Endocrine:   Patient reports excessive thirst.   Musculoskeletal:   Patient reports back pain and joint  pain.   Neurological:   Patient reports dizziness. Patient denies headaches.  Psychologic:   Patient reports anxiety. Patient denies depression.   VITAL SIGNS:    Weight 154 lb / 69.85 kg  Height 62 in / 157.48 cm  BP 149/75 mmHg  Pulse 62 /min  BMI 28.2 kg/m   MULTI-SYSTEM PHYSICAL EXAMINATION:    Constitutional: Well-nourished. No physical deformities. Normally developed. Good grooming.  Neck: Neck symmetrical, not swollen. Normal tracheal position.  Respiratory: No labored breathing, no use of accessory muscles.   Cardiovascular: Normal temperature, normal extremity pulses, no swelling, no varicosities.  Lymphatic: No enlargement of neck, axillae, groin.  Skin: No paleness, no jaundice, no cyanosis. No lesion, no ulcer, no rash.  Neurologic / Psychiatric: Oriented to time, oriented to place, oriented to person. No depression, no anxiety, no agitation.  Gastrointestinal: No mass, no tenderness, no rigidity, non obese abdomen.  Eyes: Normal conjunctivae. Normal eyelids.  Ears, Nose, Mouth, and Throat: Left ear no scars, no lesions, no masses. Right ear no scars, no lesions, no masses. Nose no scars, no lesions, no masses. Normal hearing. Normal lips.  Musculoskeletal: Normal gait and station of head and neck.     PAST DATA REVIEWED:  Source Of History:  Patient  Lab Test Review:   BUN/Creatinine, Hemoglobin and Hematocrit, LFT, Platelet Count  Records Review:   Previous Doctor Records, POC Tool  X-Ray Review: C.T. Abdomen/Pelvis: Reviewed Films. Reviewed Report. Discussed With Patient.    Notes:                     I note her creatinine was found to be normal on 07/26/16 and 0.74. Her liver function studies were normal at that time, she had a normal platelet count of 209 and her hemoglobin and hematocrit were 13.7 and 40.6 respectively.   PROCEDURES:          Urinalysis Dipstick Dipstick Cont'd  Color: Yellow Bilirubin: Neg  Appearance: Clear Ketones: Neg  Specific Gravity:  1.010 Blood: Neg  pH: 6.5 Protein: Neg  Glucose: Neg Urobilinogen: 0.2    Nitrites: Neg    Leukocyte Esterase: Neg    ASSESSMENT/PLAN:      ICD-10 Details  1 GU:   Right renal neoplasm - D49.511 Right, She has a filling defect in the upper pole as well as renal pelvis of her right kidney. 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 Confirmation needs to be obtained with visualization and a biopsy. I have discussed this with her and gone over the procedure of cystoscopy, right retrograde pyelogram, right ureteroscopy, biopsy and stent placement. We went over the risks and complications, the alternatives, the probability of success as well as the anticipated postoperative course and outpatient nature of the procedure.  2   Renal cyst - N28.1 Left, She was incidentally noted to have a left of no clinical significance.          Notes:   I'm going to proceed with evaluation of her bladder cystoscopically at the time I performed a right ureteroscopy.

## 2016-08-27 ENCOUNTER — Ambulatory Visit (HOSPITAL_BASED_OUTPATIENT_CLINIC_OR_DEPARTMENT_OTHER): Payer: Medicare Other | Admitting: Anesthesiology

## 2016-08-27 ENCOUNTER — Encounter (HOSPITAL_BASED_OUTPATIENT_CLINIC_OR_DEPARTMENT_OTHER): Payer: Self-pay

## 2016-08-27 ENCOUNTER — Ambulatory Visit (HOSPITAL_BASED_OUTPATIENT_CLINIC_OR_DEPARTMENT_OTHER)
Admission: RE | Admit: 2016-08-27 | Discharge: 2016-08-27 | Disposition: A | Discharge status: Home / Self Care | Payer: Medicare Other | Source: Ambulatory Visit | Attending: Urology | Admitting: Urology

## 2016-08-27 ENCOUNTER — Encounter (HOSPITAL_BASED_OUTPATIENT_CLINIC_OR_DEPARTMENT_OTHER)
Admission: RE | Disposition: A | Discharge status: Home / Self Care | Payer: Self-pay | Source: Ambulatory Visit | Attending: Urology

## 2016-08-27 DIAGNOSIS — K449 Diaphragmatic hernia without obstruction or gangrene: Secondary | ICD-10-CM | POA: Diagnosis not present

## 2016-08-27 DIAGNOSIS — E039 Hypothyroidism, unspecified: Secondary | ICD-10-CM | POA: Diagnosis not present

## 2016-08-27 DIAGNOSIS — Z87891 Personal history of nicotine dependence: Secondary | ICD-10-CM | POA: Insufficient documentation

## 2016-08-27 DIAGNOSIS — D4111 Neoplasm of uncertain behavior of right renal pelvis: Secondary | ICD-10-CM

## 2016-08-27 DIAGNOSIS — M797 Fibromyalgia: Secondary | ICD-10-CM | POA: Insufficient documentation

## 2016-08-27 DIAGNOSIS — I10 Essential (primary) hypertension: Secondary | ICD-10-CM | POA: Diagnosis not present

## 2016-08-27 DIAGNOSIS — M1990 Unspecified osteoarthritis, unspecified site: Secondary | ICD-10-CM | POA: Diagnosis not present

## 2016-08-27 DIAGNOSIS — J449 Chronic obstructive pulmonary disease, unspecified: Secondary | ICD-10-CM | POA: Diagnosis not present

## 2016-08-27 DIAGNOSIS — C651 Malignant neoplasm of right renal pelvis: Secondary | ICD-10-CM | POA: Diagnosis not present

## 2016-08-27 DIAGNOSIS — R1900 Intra-abdominal and pelvic swelling, mass and lump, unspecified site: Secondary | ICD-10-CM | POA: Diagnosis present

## 2016-08-27 HISTORY — DX: Other specified disorders of kidney and ureter: N28.89

## 2016-08-27 HISTORY — PX: CYSTOSCOPY WITH BIOPSY: SHX5122

## 2016-08-27 HISTORY — DX: Chronic obstructive pulmonary disease, unspecified: J44.9

## 2016-08-27 HISTORY — DX: Irritable bowel syndrome, unspecified: K58.9

## 2016-08-27 HISTORY — PX: CYSTOSCOPY WITH RETROGRADE PYELOGRAM, URETEROSCOPY AND STENT PLACEMENT: SHX5789

## 2016-08-27 HISTORY — DX: Personal history of colon polyps, unspecified: Z86.0100

## 2016-08-27 HISTORY — DX: Spondylosis, unspecified: M47.9

## 2016-08-27 HISTORY — DX: Gastro-esophageal reflux disease without esophagitis: K21.9

## 2016-08-27 HISTORY — DX: Hyperlipidemia, unspecified: E78.5

## 2016-08-27 HISTORY — DX: Gross hematuria: R31.0

## 2016-08-27 HISTORY — DX: Other constipation: K59.09

## 2016-08-27 HISTORY — DX: Personal history of colonic polyps: Z86.010

## 2016-08-27 HISTORY — DX: Unspecified osteoarthritis, unspecified site: M19.90

## 2016-08-27 HISTORY — DX: Hypothyroidism, unspecified: E03.9

## 2016-08-27 HISTORY — DX: Personal history of other diseases of the respiratory system: Z87.09

## 2016-08-27 HISTORY — DX: Diverticulosis of large intestine without perforation or abscess without bleeding: K57.30

## 2016-08-27 LAB — POCT I-STAT 4, (NA,K, GLUC, HGB,HCT)
Glucose, Bld: 125 mg/dL — ABNORMAL HIGH (ref 65–99)
HCT: 42 % (ref 36.0–46.0)
Hemoglobin: 14.3 g/dL (ref 12.0–15.0)
Potassium: 4 mmol/L (ref 3.5–5.1)
Sodium: 143 mmol/L (ref 135–145)

## 2016-08-27 SURGERY — CYSTOURETEROSCOPY, WITH RETROGRADE PYELOGRAM AND STENT INSERTION
Anesthesia: General | Site: Renal | Laterality: Right

## 2016-08-27 MED ORDER — HYDROCODONE-ACETAMINOPHEN 7.5-325 MG PO TABS
1.0000 | ORAL_TABLET | Freq: Once | ORAL | Status: AC
  Administered 2016-08-27: 1 via ORAL
  Filled 2016-08-27: qty 1

## 2016-08-27 MED ORDER — CIPROFLOXACIN IN D5W 400 MG/200ML IV SOLN
INTRAVENOUS | Status: AC
  Filled 2016-08-27: qty 200

## 2016-08-27 MED ORDER — STERILE WATER FOR IRRIGATION IR SOLN
Status: DC | PRN
  Administered 2016-08-27: 3000 mL via INTRAVESICAL

## 2016-08-27 MED ORDER — PROMETHAZINE HCL 25 MG/ML IJ SOLN
6.2500 mg | INTRAMUSCULAR | Status: DC | PRN
  Filled 2016-08-27: qty 1

## 2016-08-27 MED ORDER — EPHEDRINE 5 MG/ML INJ
INTRAVENOUS | Status: AC
  Filled 2016-08-27: qty 10

## 2016-08-27 MED ORDER — SUCCINYLCHOLINE CHLORIDE 200 MG/10ML IV SOSY
PREFILLED_SYRINGE | INTRAVENOUS | Status: AC
  Filled 2016-08-27: qty 10

## 2016-08-27 MED ORDER — SODIUM CHLORIDE 0.9 % IR SOLN
Status: DC | PRN
  Administered 2016-08-27: 1000 mL via INTRAVESICAL
  Administered 2016-08-27: 3000 mL

## 2016-08-27 MED ORDER — LIDOCAINE 2% (20 MG/ML) 5 ML SYRINGE
INTRAMUSCULAR | Status: AC
  Filled 2016-08-27: qty 5

## 2016-08-27 MED ORDER — LACTATED RINGERS IV SOLN
INTRAVENOUS | Status: DC
  Administered 2016-08-27: 07:00:00 via INTRAVENOUS
  Filled 2016-08-27: qty 1000

## 2016-08-27 MED ORDER — CIPROFLOXACIN IN D5W 400 MG/200ML IV SOLN
400.0000 mg | INTRAVENOUS | Status: AC
  Administered 2016-08-27: 400 mg via INTRAVENOUS
  Filled 2016-08-27: qty 200

## 2016-08-27 MED ORDER — FENTANYL CITRATE (PF) 100 MCG/2ML IJ SOLN
INTRAMUSCULAR | Status: DC | PRN
  Administered 2016-08-27: 25 ug via INTRAVENOUS
  Administered 2016-08-27: 50 ug via INTRAVENOUS
  Administered 2016-08-27: 25 ug via INTRAVENOUS

## 2016-08-27 MED ORDER — FENTANYL CITRATE (PF) 100 MCG/2ML IJ SOLN
INTRAMUSCULAR | Status: AC
  Filled 2016-08-27: qty 2

## 2016-08-27 MED ORDER — SUCCINYLCHOLINE CHLORIDE 200 MG/10ML IV SOSY
PREFILLED_SYRINGE | INTRAVENOUS | Status: DC | PRN
  Administered 2016-08-27: 100 mg via INTRAVENOUS

## 2016-08-27 MED ORDER — DEXAMETHASONE SODIUM PHOSPHATE 10 MG/ML IJ SOLN
INTRAMUSCULAR | Status: AC
  Filled 2016-08-27: qty 1

## 2016-08-27 MED ORDER — PROPOFOL 10 MG/ML IV BOLUS
INTRAVENOUS | Status: AC
  Filled 2016-08-27: qty 40

## 2016-08-27 MED ORDER — ONDANSETRON HCL 4 MG/2ML IJ SOLN
INTRAMUSCULAR | Status: DC | PRN
Start: 2016-08-27 — End: 2016-08-27
  Administered 2016-08-27: 4 mg via INTRAVENOUS

## 2016-08-27 MED ORDER — HYDROCODONE-ACETAMINOPHEN 7.5-325 MG PO TABS
ORAL_TABLET | ORAL | Status: AC
  Filled 2016-08-27: qty 1

## 2016-08-27 MED ORDER — ONDANSETRON HCL 4 MG/2ML IJ SOLN
INTRAMUSCULAR | Status: AC
  Filled 2016-08-27: qty 2

## 2016-08-27 MED ORDER — PROPOFOL 10 MG/ML IV BOLUS
INTRAVENOUS | Status: DC | PRN
  Administered 2016-08-27: 20 mg via INTRAVENOUS
  Administered 2016-08-27: 150 mg via INTRAVENOUS

## 2016-08-27 MED ORDER — EPHEDRINE SULFATE-NACL 50-0.9 MG/10ML-% IV SOSY
PREFILLED_SYRINGE | INTRAVENOUS | Status: DC | PRN
  Administered 2016-08-27: 5 mg via INTRAVENOUS

## 2016-08-27 MED ORDER — LIDOCAINE 2% (20 MG/ML) 5 ML SYRINGE
INTRAMUSCULAR | Status: DC | PRN
  Administered 2016-08-27: 50 mg via INTRAVENOUS

## 2016-08-27 MED ORDER — HYDROCODONE-ACETAMINOPHEN 7.5-325 MG PO TABS: 1.0000 | ORAL_TABLET | ORAL | 0 refills | Status: DC | PRN

## 2016-08-27 MED ORDER — FENTANYL CITRATE (PF) 100 MCG/2ML IJ SOLN
25.0000 ug | INTRAMUSCULAR | Status: DC | PRN
  Administered 2016-08-27: 50 ug via INTRAVENOUS
  Filled 2016-08-27: qty 1

## 2016-08-27 MED ORDER — IOHEXOL 300 MG/ML  SOLN
INTRAMUSCULAR | Status: DC | PRN
Start: 2016-08-27 — End: 2016-08-27
  Administered 2016-08-27: 16 mL via INTRAVENOUS

## 2016-08-27 MED ORDER — PHENAZOPYRIDINE HCL 200 MG PO TABS: 200.0000 mg | ORAL_TABLET | Freq: Three times a day (TID) | ORAL | 0 refills | Status: DC | PRN

## 2016-08-27 MED ORDER — DEXAMETHASONE SODIUM PHOSPHATE 10 MG/ML IJ SOLN
INTRAMUSCULAR | Status: DC | PRN
  Administered 2016-08-27: 10 mg via INTRAVENOUS

## 2016-08-27 SURGICAL SUPPLY — 40 items
BAG DRAIN URO-CYSTO SKYTR STRL (DRAIN) ×3 IMPLANT
BASKET LASER NITINOL 1.9FR (BASKET) IMPLANT
BASKET STNLS GEMINI 4WIRE 3FR (BASKET) IMPLANT
BASKET ZERO TIP NITINOL 2.4FR (BASKET) ×3 IMPLANT
CABLE HIGH FREQUENCY MONO STRZ (ELECTRODE) ×3 IMPLANT
CATH INTERMIT  6FR 70CM (CATHETERS) ×3 IMPLANT
CATH URET 5FR 28IN CONE TIP (BALLOONS)
CATH URET 5FR 70CM CONE TIP (BALLOONS) IMPLANT
CLOTH BEACON ORANGE TIMEOUT ST (SAFETY) ×3 IMPLANT
ELECT COAG BALL END 3FR (ELECTROSURGICAL) ×3
ELECT REM PT RETURN 9FT ADLT (ELECTROSURGICAL) ×3
ELECTRODE COAG BALL END 3FR (ELECTROSURGICAL) ×2 IMPLANT
ELECTRODE REM PT RTRN 9FT ADLT (ELECTROSURGICAL) ×2 IMPLANT
FIBER LASER FLEXIVA 365 (UROLOGICAL SUPPLIES) IMPLANT
FIBER LASER FLEXIVA 550 (UROLOGICAL SUPPLIES) IMPLANT
FIBER LASER TRAC TIP (UROLOGICAL SUPPLIES) IMPLANT
GLOVE BIO SURGEON STRL SZ8 (GLOVE) ×3 IMPLANT
GOWN STRL REUS W/ TWL LRG LVL3 (GOWN DISPOSABLE) ×2 IMPLANT
GOWN STRL REUS W/ TWL XL LVL3 (GOWN DISPOSABLE) ×2 IMPLANT
GOWN STRL REUS W/TWL LRG LVL3 (GOWN DISPOSABLE) ×1
GOWN STRL REUS W/TWL XL LVL3 (GOWN DISPOSABLE) ×1
GUIDEWIRE 0.038 PTFE COATED (WIRE) ×3 IMPLANT
GUIDEWIRE ANG ZIPWIRE 038X150 (WIRE) IMPLANT
GUIDEWIRE STR DUAL SENSOR (WIRE) ×3 IMPLANT
IV NS IRRIG 3000ML ARTHROMATIC (IV SOLUTION) ×6 IMPLANT
KIT BALLIN UROMAX 15FX10 (LABEL) IMPLANT
KIT BALLN UROMAX 15FX4 (MISCELLANEOUS) IMPLANT
KIT BALLN UROMAX 26 75X4 (MISCELLANEOUS)
KIT RM TURNOVER CYSTO AR (KITS) ×3 IMPLANT
MANIFOLD NEPTUNE II (INSTRUMENTS) IMPLANT
NEEDLE HYPO 22GX1.5 SAFETY (NEEDLE) IMPLANT
NEEDLE HYPO 25X1 1.5 SAFETY (NEEDLE) ×3 IMPLANT
NS IRRIG 500ML POUR BTL (IV SOLUTION) IMPLANT
PACK CYSTO (CUSTOM PROCEDURE TRAY) ×3 IMPLANT
SET HIGH PRES BAL DIL (LABEL)
SHEATH ACCESS URETERAL 38CM (SHEATH) ×3 IMPLANT
STENT POLARIS LOOP 6FR X 24 CM (STENTS) ×3 IMPLANT
SYR 10ML LL (SYRINGE) ×3 IMPLANT
TUBE CONNECTING 12X1/4 (SUCTIONS) IMPLANT
WATER STERILE IRR 3000ML UROMA (IV SOLUTION) ×3 IMPLANT

## 2016-08-27 NOTE — Transfer of Care (Signed)
Immediate Anesthesia Transfer of Care Note  Patient: Isabel Collins  Procedure(s) Performed: Procedure(s): CYSTOSCOPY WITH RIGHT RETROGRADE PYELOGRAM, RIGHT URETEROSCOPY AND STENT PLACEMENT (Right) CYSTOSCOPY WITH BIOPSY (Right)  Patient Location: PACU  Anesthesia Type:General  Level of Consciousness: awake and oriented  Airway & Oxygen Therapy: Patient Spontanous Breathing and Patient connected to nasal cannula oxygen  Post-op Assessment: Report given to RN  Post vital signs: Reviewed and stable  Last Vitals:175/78,  68, 15, 95%, 98 Vitals:   08/27/16 0725  BP: (!) 146/59  Pulse: 60  Resp: 16  Temp: 36.9 C    Last Pain:  Vitals:   08/27/16 0725  TempSrc: Oral  PainSc:          Complications: No apparent anesthesia complications

## 2016-08-27 NOTE — Anesthesia Preprocedure Evaluation (Addendum)
Anesthesia Evaluation  Patient identified by MRN, date of birth, ID band Patient awake    Reviewed: Allergy & Precautions, NPO status , Patient's Chart, lab work & pertinent test results, reviewed documented beta blocker date and time   Airway Mallampati: II  TM Distance: >3 FB Neck ROM: Full    Dental  (+) Dental Advisory Given   Pulmonary COPD,  COPD inhaler, former smoker,    breath sounds clear to auscultation       Cardiovascular hypertension, Pt. on medications and Pt. on home beta blockers  Rhythm:Regular Rate:Normal     Neuro/Psych negative neurological ROS     GI/Hepatic Neg liver ROS, hiatal hernia, GERD  Medicated,  Endo/Other  Hypothyroidism   Renal/GU negative Renal ROS     Musculoskeletal  (+) Arthritis , Fibromyalgia -  Abdominal   Peds  Hematology negative hematology ROS (+)   Anesthesia Other Findings   Reproductive/Obstetrics                            Anesthesia Physical Anesthesia Plan  ASA: III  Anesthesia Plan: General   Post-op Pain Management:    Induction: Intravenous  PONV Risk Score and Plan: 4 or greater and Ondansetron, Dexamethasone, Propofol, Midazolam and Treatment may vary due to age or medical condition  Airway Management Planned: LMA  Additional Equipment:   Intra-op Plan:   Post-operative Plan: Extubation in OR  Informed Consent: I have reviewed the patients History and Physical, chart, labs and discussed the procedure including the risks, benefits and alternatives for the proposed anesthesia with the patient or authorized representative who has indicated his/her understanding and acceptance.   Dental advisory given  Plan Discussed with: CRNA  Anesthesia Plan Comments:        Anesthesia Quick Evaluation

## 2016-08-27 NOTE — Discharge Instructions (Signed)
Post stent placement instructions   Definitions:  Ureter: The duct that transports urine from the kidney to the bladder. Stent: A plastic hollow tube that is placed into the ureter, from the kidney to the bladder to prevent the ureter from swelling shut.  General instructions:  Despite the fact that no skin incisions were used, the area around the ureter and bladder is raw and irritated. The stent is a foreign body which can further irritate the bladder wall. This irritation is manifested by increased frequency of urination, both day and night, and by an increase in the urge to urinate. In some, the urge to urinate is present almost always. Sometimes the urge is strong enough that you may not be able to stop your self from urinating. This can often be controlled with medication but does not occur in everyone. A stent can safely be left in place for 3 months or greater.  You may see some blood in your urine while the stent is in place and a few days afterward. Do not be alarmed, even if the urine is clear for a while. Get off your feet and drink lots of fluids until clearing occurs. If you start to pass clots or don't improve, call us.  Diet:  You may return to your normal diet immediately. Because of the raw surface of your bladder, alcohol, spicy foods, foods high in acid and drinks with caffeine may cause irritation or frequency and should be used in moderation. To keep your urine flowing freely and avoid constipation, drink plenty of fluids during the day (8-10 glasses). Tip: Avoid cranberry juice because it is very acidic.  Activity:  Your physical activity doesn't need to be restricted. However, if you are very active, you may see some blood in the urine. We suggest that you reduce your activity under the circumstances until the bleeding has stopped.  Bowels:  It is important to keep your bowels regular during the postoperative period. Straining with bowel movements can cause bleeding. A  bowel movement every other day is reasonable. Use a mild laxative if needed, such as milk of magnesia 2-3 tablespoons, or 2 Dulcolax tablets. Call if you continue to have problems. If you had been taking narcotics for pain, before, during or after your surgery, you may be constipated. Take a laxative if necessary.  Medication:  You should resume your pre-surgery medications unless told not to. In addition you may be given an antibiotic to prevent or treat infection. Antibiotics are not always necessary. All medication should be taken as prescribed until the bottles are finished unless you are having an unusual reaction to one of the drugs.  Problems you should report to us:  a. Fever greater than 101F. b. Heavy bleeding, or clots (see notes above about blood in urine). c. Inability to urinate. d. Drug reactions (hives, rash, nausea, vomiting, diarrhea). e. Severe burning or pain with urination that is not improving.       Post Anesthesia Home Care Instructions  Activity: Get plenty of rest for the remainder of the day. A responsible individual must stay with you for 24 hours following the procedure.  For the next 24 hours, DO NOT: -Drive a car -Operate machinery -Drink alcoholic beverages -Take any medication unless instructed by your physician -Make any legal decisions or sign important papers.  Meals: Start with liquid foods such as gelatin or soup. Progress to regular foods as tolerated. Avoid greasy, spicy, heavy foods. If nausea and/or vomiting occur, drink only clear   and/or vomiting subsides. Call your physician if vomiting continues.  Special Instructions/Symptoms: Your throat may feel dry or sore from the anesthesia or the breathing tube placed in your throat during surgery. If this causes discomfort, gargle with warm salt water. The discomfort should disappear within 24 hours.  If you had a scopolamine patch placed behind your ear for the management of  post- operative nausea and/or vomiting:  1. The medication in the patch is effective for 72 hours, after which it should be removed.  Wrap patch in a tissue and discard in the trash. Wash hands thoroughly with soap and water. 2. You may remove the patch earlier than 72 hours if you experience unpleasant side effects which may include dry mouth, dizziness or visual disturbances. 3. Avoid touching the patch. Wash your hands with soap and water after contact with the patch.        

## 2016-08-27 NOTE — Anesthesia Postprocedure Evaluation (Signed)
Anesthesia Post Note  Patient: Isabel Collins  Procedure(s) Performed: Procedure(s) (LRB): CYSTOSCOPY WITH RIGHT RETROGRADE PYELOGRAM, RIGHT URETEROSCOPY AND STENT PLACEMENT (Right) CYSTOSCOPY WITH BIOPSY (Right)     Patient location during evaluation: PACU Anesthesia Type: General Level of consciousness: awake and alert Pain management: pain level controlled Vital Signs Assessment: post-procedure vital signs reviewed and stable Respiratory status: spontaneous breathing, nonlabored ventilation, respiratory function stable and patient connected to nasal cannula oxygen Cardiovascular status: blood pressure returned to baseline and stable Postop Assessment: no signs of nausea or vomiting Anesthetic complications: no    Last Vitals:  Vitals:   08/27/16 0915 08/27/16 0930  BP: 133/80 (!) 158/81  Pulse: 63 62  Resp: 16 12  Temp:      Last Pain:  Vitals:   08/27/16 0930  TempSrc:   PainSc: 5                  Tiajuana Amass

## 2016-08-27 NOTE — Op Note (Addendum)
PATIENT:  Isabel Collins  PRE-OPERATIVE DIAGNOSIS: Right renal pelvis mass  POST-OPERATIVE DIAGNOSIS: Same  PROCEDURE: 1. Cystoscopy with right retrograde pyelogram including interpretation 2. 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 3. Fulguration of right renal pelvic tumor. 4. Fluoroscopy time less than 1 hour.  SURGEON:  Claybon Jabs  INDICATION: Isabel Collins is a 76 year old female who experienced gross hematuria and had undergone a CT scan which revealed an enhancing mass involving the upper pole and renal pelvis of her right kidney. She said she had been experiencing intermittent painless gross hematuria for about 3-4 months and would occasionally past which she described as some form of tissue. She is brought to the operating room for evaluation of the mass and biopsy.  ANESTHESIA:  General  EBL:  Minimal  DRAINS: 6 French, 24 cm Polaris stent in the right ureter  LOCAL MEDICATIONS USED:  None  SPECIMEN:  1. Right renal pelvis barbotage for cytology. 2. Biopsy of right renal pelvic mass to pathology.  Description of procedure: After informed consent the patient was taken to the operating room and placed on the table in a supine position. General anesthesia was then administered. Once fully anesthetized the patient was moved to the dorsal lithotomy position and the genitalia were sterilely prepped and draped in standard fashion. An official timeout was then performed.  Initially the 23 French cystoscope with 30 lens was passed into the bladder. Urethra was noted be normal. The bladder was then entered and fully and systematically inspected. There were no tumors, stones or inflammatory lesions within the bladder. Both ureteral orifices were noted to be of normal configuration and position.  A 6 French open-ended ureteral catheter was then passed through the cystoscope and into the right ureteral orifice. I then performed a right retrograde pyelogram  by injecting full-strength Omnipaque contrast through the open-ended catheter and up the right ureter under direct fluoroscopy. This revealed a normal ureter throughout its length with no mass or filling defects. The renal pelvis appeared to have a mass protruding from the upper pole region with filling of the lower pole calyces but some delay in filling of the upper pole.  A 0.038 inch sensor guidewire was then passed through the open ended catheter and into the area the right renal pelvis and left in position. I then used the inner portion of a ureteral access sheath to gently dilate the intramural ureter and then proceeded with rigid ureteroscopy using the 6 French rigid ureteroscope. This was passed under direct vision up the right ureter next to the guidewire and the ureter revealed no abnormality throughout its length. Upon entering the renal pelvis I noted a solid mass protruding from the upper pole into the renal pelvis. This was photographed. I then used the biopsy forceps and obtained a miniscule amount of tissue so I switched to a 0 tip nitinol basket and was able to engage a portion of the lesion and extract this and sent it to pathology. I used a 10 mL syringe to barbotage the renal pelvis and sent this separately for cytology.  Because there was some bleeding noted I elected to fulgurate the biopsy sites. The Bugbee electrode was passed through the ureteroscope and I was able to fulgurate all bleeding sites on the lesion. I turned off the water and waited to see if any further bleeding occurred and none was noted. I therefore removed the ureteroscope and proceeded with stent placement.  The cystoscope was backloaded onto the  guidewire and I then passed the stent over the guidewire into the area of the renal pelvis. As I removed the guidewire good curl was noted in the area the renal pelvis. The bladder was then emptied and the patient was awakened and taken to the recovery room in stable and  satisfactory condition. She tolerated the procedure well with no intraoperative complications.  PLAN OF CARE: Discharge to home after PACU  PATIENT DISPOSITION:  PACU - hemodynamically stable.

## 2016-08-27 NOTE — Anesthesia Procedure Notes (Signed)
Procedure Name: Intubation Date/Time: 08/27/2016 7:38 AM Performed by: Bethena Roys T Pre-anesthesia Checklist: Patient identified, Emergency Drugs available, Suction available and Patient being monitored Patient Re-evaluated:Patient Re-evaluated prior to inductionOxygen Delivery Method: Circle system utilized Preoxygenation: Pre-oxygenation with 100% oxygen Intubation Type: IV induction Ventilation: Mask ventilation without difficulty Laryngoscope Size: Mac and 3 Grade View: Grade I Tube type: Oral Tube size: 7.0 mm Number of attempts: 1 Airway Equipment and Method: Stylet and Oral airway Placement Confirmation: ETT inserted through vocal cords under direct vision,  positive ETCO2 and breath sounds checked- equal and bilateral Secured at: 21 cm Tube secured with: Tape Dental Injury: Teeth and Oropharynx as per pre-operative assessment

## 2016-08-28 ENCOUNTER — Encounter (HOSPITAL_BASED_OUTPATIENT_CLINIC_OR_DEPARTMENT_OTHER): Payer: Self-pay | Admitting: Urology

## 2016-08-29 NOTE — Telephone Encounter (Signed)
Called patient to verify the pharmacy that she wanted to have on file. Pt stated that she has an appointment with Dr. Brigitte Pulse today and advised patient that it was tomorrow, patient stated "isn't today the 28th?" She told me how she is hurting all over and she is using the bathroom on herself. She is afraid to bath herself and that she needs help. Patient stated that someone from personal care is going to call her and come and help her out.

## 2016-08-29 NOTE — Telephone Encounter (Signed)
Glad she has help arranged. Can be hard to afford. Will use her visit tomorrow to discuss her pain and incontinence.

## 2016-08-30 ENCOUNTER — Ambulatory Visit: Payer: Medicare Other | Admitting: Family Medicine

## 2016-08-30 NOTE — Progress Notes (Deleted)
   Subjective:    Patient ID: Isabel Collins, female    DOB: 1940/11/06, 76 y.o.   MRN: [id number]  HPI    Review of Systems     Objective:   Physical Exam        Assessment & Plan:

## 2016-09-03 ENCOUNTER — Telehealth: Payer: Self-pay | Admitting: *Deleted

## 2016-09-03 NOTE — Telephone Encounter (Signed)
Dr Lowanda Foster Shooker Physical therapist from [facility] would like for you to put in an order for pt to continue her PT 2 times a week for 5 weeks then 1 time a week for 3 weeks to address her balance Gait strength and fall prevention please call at [phone]

## 2016-09-04 ENCOUNTER — Telehealth: Payer: Self-pay | Admitting: Emergency Medicine

## 2016-09-04 NOTE — Telephone Encounter (Signed)
Okay to give verbal order.

## 2016-09-04 NOTE — Telephone Encounter (Signed)
Yes, that's fine 

## 2016-09-04 NOTE — Telephone Encounter (Signed)
Verbal order given  

## 2016-09-06 ENCOUNTER — Encounter: Payer: Medicare Other | Admitting: Internal Medicine

## 2016-09-06 ENCOUNTER — Telehealth: Payer: Self-pay | Admitting: Family Medicine

## 2016-09-06 NOTE — Telephone Encounter (Signed)
[facility] is needing a verbal order of once a week for two weeks and then 1 PRN for community resources and care plan   Best number is [phone]

## 2016-09-07 ENCOUNTER — Telehealth: Payer: Self-pay | Admitting: Family Medicine

## 2016-09-07 NOTE — Telephone Encounter (Signed)
[facility] is calling again checking on the status of the verbal order.  She is very persistent and is wanting to see the pt as soon as possible.  Please advise with verbal order

## 2016-09-07 NOTE — Telephone Encounter (Signed)
Dr Brigitte Pulse, please advise. thanks

## 2016-09-07 NOTE — Telephone Encounter (Signed)
Pt is calling stating that she is needing help around the house.  She states that [facility] services have sent over an order form and is in need of a response.  Pt also states that [facility] is expensive and only offers bathing services, whereas she needs help doing everything.  Pt's living conditions are becoming unbearable.  Please fill out [facility] services form as soon as possible.

## 2016-09-08 ENCOUNTER — Other Ambulatory Visit: Payer: Self-pay | Admitting: Family Medicine

## 2016-09-08 MED ORDER — LEVOTHYROXINE SODIUM 75 MCG PO TABS: 75.0000 ug | ORAL_TABLET | Freq: Every day | ORAL | 1 refills | Status: AC

## 2016-09-08 MED ORDER — PREGABALIN 150 MG PO CAPS: 150.0000 mg | ORAL_CAPSULE | Freq: Two times a day (BID) | ORAL | 0 refills | Status: AC

## 2016-09-08 MED ORDER — METOPROLOL SUCCINATE ER 100 MG PO TB24: 100.0000 mg | ORAL_TABLET | Freq: Every day | ORAL | 0 refills | Status: DC

## 2016-09-08 MED ORDER — ATORVASTATIN CALCIUM 40 MG PO TABS: 40.0000 mg | ORAL_TABLET | Freq: Every evening | ORAL | 0 refills | Status: AC

## 2016-09-08 MED ORDER — DULOXETINE HCL 30 MG PO CPEP: ORAL_CAPSULE | ORAL | 0 refills | Status: DC

## 2016-09-08 NOTE — Telephone Encounter (Signed)
Fine to give [facility] a verbal ok as long as pt still wants. Will complete [facility] form as soon as I see it.

## 2016-09-10 NOTE — Telephone Encounter (Signed)
Verbal order given  

## 2016-09-10 NOTE — Telephone Encounter (Signed)
Left voicemail to call back for verbal order.

## 2016-09-11 ENCOUNTER — Telehealth: Payer: Self-pay | Admitting: Family Medicine

## 2016-09-11 NOTE — Telephone Encounter (Signed)
DAVID FROM [facility] TO GET A ROLLER WALKER WITH A SEAT THE ONE SHE HAS IS NOT STABLE ANS THE NEW ONE SHE COULD SIT DOWN WHEN SHE HAS TO DAVIDS NUMBER [phone] AND   FAX NUMBER [phone]

## 2016-09-11 NOTE — Telephone Encounter (Signed)
Please advise 

## 2016-09-12 ENCOUNTER — Telehealth: Payer: Self-pay

## 2016-09-12 NOTE — Telephone Encounter (Signed)
Dr. Brigitte Pulse,  I have placed a PA form for Duloxetine HCL in your box. Please see highlighted items. I have filled in as much information as I could; however; regarding the highlighted items, I did not have enough detailed information to fill out. I would like you to take a look at this before I fax it to possibly avoid PA rejection.  Thank you.

## 2016-09-12 NOTE — Telephone Encounter (Signed)
That's fine - can attach DME order to Dx of lumbosacral spondylosis, fibromyalgia, and dyspnea on exertion which are on her problem list. Thanks.

## 2016-09-13 NOTE — Telephone Encounter (Signed)
Call placed to [facility] with [facility] [phone]) to obtain DME paperwork for rolling walker with seat. No answer on phone number provided, left message with office phone and fax number./ S.Keith Cancio,CMA

## 2016-09-14 NOTE — Telephone Encounter (Signed)
Completed and returned to "fax" box in provider's lounge.

## 2016-09-15 NOTE — Telephone Encounter (Signed)
PATIENT IS CALLING BACK FOR. DR. Brigitte Pulse. SHE SAID SHE REALLY NEEDS TO GET SOME HELP AROUND HER HOUSE. SHE WOULD LIKE TO GET A CALL AS SOON AS POSSIBLE BECAUSE SHE HAS BEEN WAITING TO HEAR SOMETHING FOR  A WHILE. BEST PHONE [phone] (HOME) [place]

## 2016-09-17 ENCOUNTER — Emergency Department (HOSPITAL_COMMUNITY)
Admission: EM | Admit: 2016-09-17 | Discharge: 2016-09-17 | Discharge status: Against Medical Advice | Payer: Medicare Other | Attending: Emergency Medicine | Admitting: Emergency Medicine

## 2016-09-17 ENCOUNTER — Emergency Department (HOSPITAL_COMMUNITY): Payer: Medicare Other

## 2016-09-17 ENCOUNTER — Encounter (HOSPITAL_COMMUNITY): Payer: Self-pay | Admitting: Emergency Medicine

## 2016-09-17 ENCOUNTER — Other Ambulatory Visit (HOSPITAL_COMMUNITY): Payer: Medicare Other

## 2016-09-17 DIAGNOSIS — Z79899 Other long term (current) drug therapy: Secondary | ICD-10-CM | POA: Insufficient documentation

## 2016-09-17 DIAGNOSIS — R1030 Lower abdominal pain, unspecified: Secondary | ICD-10-CM | POA: Diagnosis present

## 2016-09-17 DIAGNOSIS — I10 Essential (primary) hypertension: Secondary | ICD-10-CM | POA: Diagnosis not present

## 2016-09-17 DIAGNOSIS — E039 Hypothyroidism, unspecified: Secondary | ICD-10-CM | POA: Diagnosis not present

## 2016-09-17 DIAGNOSIS — N3001 Acute cystitis with hematuria: Secondary | ICD-10-CM | POA: Insufficient documentation

## 2016-09-17 DIAGNOSIS — J449 Chronic obstructive pulmonary disease, unspecified: Secondary | ICD-10-CM | POA: Diagnosis not present

## 2016-09-17 DIAGNOSIS — Z87891 Personal history of nicotine dependence: Secondary | ICD-10-CM | POA: Insufficient documentation

## 2016-09-17 LAB — URINALYSIS, ROUTINE W REFLEX MICROSCOPIC
Bilirubin Urine: NEGATIVE
GLUCOSE, UA: NEGATIVE mg/dL
KETONES UR: NEGATIVE mg/dL
Nitrite: NEGATIVE
PROTEIN: 100 mg/dL — AB
Specific Gravity, Urine: 1.002 — ABNORMAL LOW (ref 1.005–1.030)
pH: 6 (ref 5.0–8.0)

## 2016-09-17 LAB — CBC WITH DIFFERENTIAL/PLATELET
BASOS ABS: 0 10*3/uL (ref 0.0–0.1)
BASOS PCT: 0 %
EOS ABS: 0.1 10*3/uL (ref 0.0–0.7)
Eosinophils Relative: 1 %
HEMATOCRIT: 37.6 % (ref 36.0–46.0)
HEMOGLOBIN: 12.6 g/dL (ref 12.0–15.0)
Lymphocytes Relative: 13 %
Lymphs Abs: 1.1 10*3/uL (ref 0.7–4.0)
MCH: 27.8 pg (ref 26.0–34.0)
MCHC: 33.5 g/dL (ref 30.0–36.0)
MCV: 82.8 fL (ref 78.0–100.0)
MONOS PCT: 12 %
Monocytes Absolute: 1 10*3/uL (ref 0.1–1.0)
NEUTROS ABS: 6.4 10*3/uL (ref 1.7–7.7)
NEUTROS PCT: 75 %
Platelets: ADEQUATE 10*3/uL (ref 150–400)
RBC: 4.54 MIL/uL (ref 3.87–5.11)
RDW: 13.9 % (ref 11.5–15.5)
WBC: 9.1 10*3/uL (ref 4.0–10.5)

## 2016-09-17 LAB — COMPREHENSIVE METABOLIC PANEL
ALK PHOS: 82 U/L (ref 38–126)
ALT: 9 U/L — ABNORMAL LOW (ref 14–54)
ANION GAP: 6 (ref 5–15)
AST: 16 U/L (ref 15–41)
Albumin: 3.4 g/dL — ABNORMAL LOW (ref 3.5–5.0)
BILIRUBIN TOTAL: 0.7 mg/dL (ref 0.3–1.2)
BUN: 6 mg/dL (ref 6–20)
CALCIUM: 8.7 mg/dL — AB (ref 8.9–10.3)
CO2: 29 mmol/L (ref 22–32)
CREATININE: 0.66 mg/dL (ref 0.44–1.00)
Chloride: 104 mmol/L (ref 101–111)
Glucose, Bld: 118 mg/dL — ABNORMAL HIGH (ref 65–99)
Potassium: 3.9 mmol/L (ref 3.5–5.1)
SODIUM: 139 mmol/L (ref 135–145)
TOTAL PROTEIN: 6.5 g/dL (ref 6.5–8.1)

## 2016-09-17 LAB — LIPASE, BLOOD: Lipase: 13 U/L (ref 11–51)

## 2016-09-17 MED ORDER — CEPHALEXIN 500 MG PO CAPS: 500.0000 mg | ORAL_CAPSULE | Freq: Two times a day (BID) | ORAL | 0 refills | Status: AC

## 2016-09-17 MED ORDER — CEPHALEXIN 500 MG PO CAPS
500.0000 mg | ORAL_CAPSULE | Freq: Once | ORAL | Status: AC
  Administered 2016-09-17: 500 mg via ORAL
  Filled 2016-09-17: qty 1

## 2016-09-17 MED ORDER — MECLIZINE HCL 25 MG PO TABS
25.0000 mg | ORAL_TABLET | Freq: Once | ORAL | Status: DC
  Filled 2016-09-17: qty 1

## 2016-09-17 MED ORDER — ACETAMINOPHEN 325 MG PO TABS
650.0000 mg | ORAL_TABLET | Freq: Once | ORAL | Status: AC
  Administered 2016-09-17: 650 mg via ORAL
  Filled 2016-09-17: qty 2

## 2016-09-17 MED ORDER — SODIUM CHLORIDE 0.9 % IV BOLUS (SEPSIS)
1000.0000 mL | Freq: Once | INTRAVENOUS | Status: AC
  Administered 2016-09-17: 1000 mL via INTRAVENOUS

## 2016-09-17 NOTE — ED Notes (Signed)
Called anno[facility] living spoke with after hrs Doren Custard [phone] Referred to Carin Hock [phone] Not a assistance living facility    Tried to find relative or other for ride home Provider to evaluate TBD

## 2016-09-17 NOTE — Telephone Encounter (Signed)
Form has been faxed. Awaiting Response

## 2016-09-17 NOTE — ED Notes (Signed)
Bed: ML46 Expected date:  Expected time:  Means of arrival:  Comments: EMS-hematuria/abdominal pain

## 2016-09-17 NOTE — ED Notes (Signed)
Provider notified and pt demanded to leave. Taxi called and pt leaving  Pt states she just wants to go and will take her chances to the provider.

## 2016-09-17 NOTE — ED Provider Notes (Addendum)
[facility] DEPT Provider Note   CSN: [id number] Arrival date & time: 09/17/16  1535     History   Chief Complaint Chief Complaint  Patient presents with  . Abdominal Pain  . Hematuria    HPI Isabel Collins is a 76 y.o. female.  HPI  76 year old female presents with hematuria, dysuria, and lower abdominal pain. She is an overall poor historian. On 6/25 she had a right ureteral stent placed. Since then she states she has been on antibiotic and just finished a couple days ago. She does not know the name of it. She has had some lower abdominal pressure since the surgery. However over the last few days she's been developing some lower abdominal pain. Pain seems to come and go in as severe when it comes. It is not currently present. She's not sure what exactly causes it but sometimes is related to change in position. She has chronic back problems, no new pain in her back. She felt she had a fever the other day but did not take her temperature. No nausea or vomiting. She had hematuria after the surgery but then this eventually cleared but now is becoming pink again this morning. She's had decreased urine output today. Ibuprofen she takes occasionally for the pain this seems to help. The pain currently is mostly in her left lower quadrant. She also endorses feeling dizzy for the last couple weeks. She has a hard time describing exactly when this dizziness occurs.   Past Medical History:  Diagnosis Date  . Anemia   . Anxiety   . Chronic constipation   . COPD (chronic obstructive pulmonary disease) ([place])   . Degenerative arthritis of spine   . Diverticulosis of colon   . Fibromyalgia   . GERD (gastroesophageal reflux disease)   . Gross hematuria   . Hiatal hernia   . History of chronic bronchitis   . History of colon polyps   . Hyperlipidemia   . Hypertension   . Hypothyroidism   . IBS (irritable bowel syndrome)   . OA (osteoarthritis)    hips  . Renal mass, right      Patient Active Problem List   Diagnosis Date Noted  . Dyspnea on exertion 06/25/2016  . Fibromyalgia 05/23/2016  . 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 Hypertension 02/10/2016  . High cholesterol 02/10/2016  . Hiatal hernia 02/10/2016    Past Surgical History:  Procedure Laterality Date  . arm surgery Left   . BREAST REDUCTION SURGERY Bilateral   . CHOLECYSTECTOMY    . CYSTOSCOPY WITH BIOPSY Right 08/27/2016   Procedure: CYSTOSCOPY WITH BIOPSY;  Surgeon: Kathie Rhodes, MD;  Location: [facility];  Service: Urology;  Laterality: Right;  . CYSTOSCOPY WITH RETROGRADE PYELOGRAM, URETEROSCOPY AND STENT PLACEMENT Right 08/27/2016   Procedure: CYSTOSCOPY WITH RIGHT RETROGRADE PYELOGRAM, RIGHT URETEROSCOPY AND STENT PLACEMENT;  Surgeon: Kathie Rhodes, MD;  Location: [facility];  Service: Urology;  Laterality: Right;  . REPLACEMENT TOTAL KNEE Left   . TOTAL SHOULDER REPLACEMENT Right   . TUBAL LIGATION      OB History    No data available       Home Medications    Prior to Admission medications   Medication Sig Start Date End Date Taking? Authorizing Provider  ADVAIR DISKUS 250-50 MCG/DOSE AEPB Inhale 1 puff into the lungs 2 (two) times daily as needed (shortness of breath).  06/06/16  Yes [provider]  albuterol (PROVENTIL HFA;VENTOLIN  HFA) 108 (90 Base) MCG/ACT inhaler Inhale 1-2 puffs into the lungs every 6 (six) hours as needed for wheezing. 05/20/16  Yes Larene Pickett, PA-C  atorvastatin (LIPITOR) 40 MG tablet Take 1 tablet (40 mg total) by mouth every evening. Patient taking differently: Take 40 mg by mouth every morning.  09/08/16  Yes [name], MD  bisacodyl (DULCOLAX) 5 MG EC tablet Take 5 mg by mouth at bedtime as needed for moderate constipation.    Yes [provider]  cholecalciferol (VITAMIN D) 400 units TABS tablet Take 400 Units by mouth.   Yes [provider]   cycloSPORINE (RESTASIS) 0.05 % ophthalmic emulsion Place 1 drop into both eyes 2 (two) times daily.    Yes [provider]  DULoxetine (CYMBALTA) 30 MG capsule Take two capsules po in the morning and one capsule po in the afternoon. 09/08/16  Yes [name], MD  fluticasone [facility]) 50 MCG/ACT nasal spray Place 2 sprays into both nostrils at bedtime as needed for allergies or rhinitis.    Yes [provider]  levothyroxine (SYNTHROID, LEVOTHROID) 75 MCG tablet Take 1 tablet (75 mcg total) by mouth daily before breakfast. 09/08/16  Yes [name], MD  metoprolol succinate (TOPROL-XL) 100 MG 24 hr tablet Take 1 tablet (100 mg total) by mouth daily. Take with or immediately following a meal. 09/08/16  Yes [name], MD  Multiple Vitamin (ONE-A-DAY 55 PLUS PO) Take 1 tablet by mouth daily.   Yes [provider]  polyvinyl alcohol (LIQUIFILM TEARS) 1.4 % ophthalmic solution Place 1 drop into both eyes every 4 (four) hours as needed for dry eyes.   Yes [provider]  pregabalin (LYRICA) 150 MG capsule Take 1 capsule (150 mg total) by mouth 2 (two) times daily. 09/08/16  Yes [name], MD  PRESCRIPTION MEDICATION Take 0.25 tablets by mouth at bedtime as needed (SLEEP). Sleep Medicine, patient is unsure of name or strength.   Yes [provider]  cephALEXin (KEFLEX) 500 MG capsule Take 1 capsule (500 mg total) by mouth 2 (two) times daily. 09/17/16 09/24/16  Sherwood Gambler, MD  HYDROcodone-acetaminophen (NORCO) 7.5-325 MG tablet Take 1-2 tablets by mouth every 4 (four) hours as needed for moderate pain. Maximum dose per 24 hours - 8 pills Patient not taking: Reported on 09/17/2016 08/27/16   Kathie Rhodes, MD  HYDROcodone-acetaminophen (NORCO/VICODIN) 5-325 MG tablet Take 1-2 tablets by mouth every 4 (four) hours as needed for moderate pain. Patient not taking: Reported on 09/17/2016 07/26/16   [name], MD  NEXIUM 40 MG capsule Take 1 capsule (40 mg total) by  mouth daily. Patient not taking: Reported on 09/17/2016 06/11/16   [name], MD  nitrofurantoin, macrocrystal-monohydrate, (MACROBID) 100 MG capsule Take 100 mg by mouth 2 (two) times daily. 09/03/16   [provider]  Oxycodone HCl 10 MG TABS Take 10-20 mg by mouth every 4 (four) hours. 09/03/16   [provider]  phenazopyridine (PYRIDIUM) 200 MG tablet Take 1 tablet (200 mg total) by mouth 3 (three) times daily as needed for pain. Patient not taking: Reported on 09/17/2016 08/27/16   Kathie Rhodes, MD  traZODone (DESYREL) 50 MG tablet Take 12.5 mg by mouth at bedtime as needed for sleep.     [provider]    Family History Family History  Problem Relation Age of Onset  . Diabetes Mother   . Heart disease Mother   . Diabetes Father   .  Heart disease Father   . Diabetes Sister   . Diabetes Brother        x3  . Colon cancer Neg Hx   . Esophageal cancer Neg Hx   . Stomach cancer Neg Hx   . Pancreatic cancer Neg Hx     Social History Social History  Substance Use Topics  . Smoking status: Former Smoker    Packs/day: 0.50    Years: 46.00    Types: Cigarettes    Quit date: 03/05/2004  . Smokeless tobacco: Never Used  . Alcohol use No     Allergies   Patient has no known allergies.   Review of Systems Review of Systems  Constitutional: Positive for fever (subjective).  Cardiovascular: Negative for chest pain.  Gastrointestinal: Positive for abdominal pain. Negative for nausea and vomiting.  Genitourinary: Positive for decreased urine volume, dysuria and hematuria.  Musculoskeletal: Positive for back pain.  Neurological: Positive for dizziness.  All other systems reviewed and are negative.    Physical Exam Updated Vital Signs BP (!) 142/65 (BP Location: Left Arm)   Pulse 74   Temp 98.3 F (36.8 C) (Oral)   Resp 18   Ht 5\' 2"  (1.575 m)   Wt 70.3 kg (155 lb)   SpO2 97%   BMI 28.35 kg/m   Physical Exam  Constitutional: She is oriented to  person, place, and time. She appears well-developed and well-nourished.  HENT:  Head: Normocephalic and atraumatic.  Right Ear: External ear normal.  Left Ear: External ear normal.  Nose: Nose normal.  Eyes: Right eye exhibits no discharge. Left eye exhibits no discharge.  Cardiovascular: Normal rate, regular rhythm and normal heart sounds.   Pulmonary/Chest: Effort normal and breath sounds normal.  Abdominal: Soft. She exhibits no distension. There is no tenderness.  Neurological: She is alert and oriented to person, place, and time.  Skin: Skin is warm and dry. She is not diaphoretic.  Nursing note and vitals reviewed.    ED Treatments / Results  Labs (all labs ordered are listed, but only abnormal results are displayed) Labs Reviewed  URINALYSIS, ROUTINE W REFLEX MICROSCOPIC - Abnormal; Notable for the following:       Result Value   APPearance CLOUDY (*)    Specific Gravity, Urine 1.002 (*)    Hgb urine dipstick LARGE (*)    Protein, ur 100 (*)    Leukocytes, UA LARGE (*)    Bacteria, UA MANY (*)    Squamous Epithelial / LPF 0-5 (*)    All other components within normal limits  COMPREHENSIVE METABOLIC PANEL - Abnormal; Notable for the following:    Glucose, Bld 118 (*)    Calcium 8.7 (*)    Albumin 3.4 (*)    ALT 9 (*)    All other components within normal limits  URINE CULTURE  LIPASE, BLOOD  CBC WITH DIFFERENTIAL/PLATELET    EKG  EKG Interpretation  Date/Time:  Monday September 17 2016 16:58:51 EDT Ventricular Rate:  76 PR Interval:    QRS Duration: 99 QT Interval:  402 QTC Calculation: 452 R Axis:   15 Text Interpretation:  Sinus rhythm Low voltage, precordial leads no significant change compared to Mar 2018 Confirmed by Sherwood Gambler [phone]) on 09/17/2016 6:24:35 PM       Radiology Ct Renal Stone Study  Result Date: 09/17/2016 CLINICAL DATA:  76 year old hypertensive female with abdominal and flank pain. Stent placed on right side. Pain radiating to hip.  Intermittent left abdominal pain. Vaginal  discharge. Post cholecystectomy. Initial encounter. EXAM: CT ABDOMEN AND PELVIS WITHOUT CONTRAST TECHNIQUE: Multidetector CT imaging of the abdomen and pelvis was performed following the standard protocol without IV contrast. COMPARISON:  08/27/2016 intraoperative C-arm.  07/19/2016 CT. FINDINGS: Lower chest: Minimal basilar scarring/atelectasis. Heart size within normal limits. Moderate-size hiatal hernia. Hepatobiliary: Taking into account limitation by non contrast imaging, no worrisome hepatic lesion. Postcholecystectomy. Pancreas: Taking into account limitation by non contrast imaging, no pancreatic mass or inflammation Spleen: Taking into account limitation by non contrast imaging, no mass. Top-normal length. Adrenals/Urinary Tract: Stent has been placed into the right renal collecting system. Proximal pigtail catheter is not completely formed. Loop like component bladder aspect. 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 The right ureter remains dilated and therefore the stent may not be functioning properly. No left-sided hydronephrosis. Mild hyperplasia adrenal glands. Stomach/Bowel: Moderate-size hiatal hernia. Majority of stomach decompressed. Scattered colonic diverticula without inflammation. Vascular/Lymphatic: Atherosclerotic changes aorta and branch vessels. No aortic aneurysm. No adenopathy. Reproductive: Post hysterectomy.  No worrisome adnexal mass. Other: No free intraperitoneal air or bowel containing hernia. Musculoskeletal: Scoliosis with superimposed prominent degenerative changes without osseous destructive lesion. IMPRESSION: Stent within right renal collecting system. 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 Moderate-size hiatal. Scoliosis with superimposed degenerative changes. Aortic atherosclerosis. Electronically Signed   By: Genia Del M.D.   On: 09/17/2016 17:50    Procedures Procedures (including critical care time)  Medications Ordered in ED Medications  cephALEXin (KEFLEX) capsule 500 mg (not administered)  sodium chloride 0.9 % bolus 1,000 mL (0 mLs Intravenous Stopped 09/17/16 1845)  acetaminophen (TYLENOL) tablet 650 mg (650 mg Oral Given 09/17/16 1701)     Initial Impression / Assessment and Plan / ED Course  I have reviewed the triage vital signs and the nursing notes.  Pertinent labs & imaging results that were available during my care of the patient were reviewed by me and considered in my medical decision making (see chart for details).     Patient's workup and symptoms are consistent with a urinary tract infection. She has no nausea or vomiting or new back pain. No abdominal tenderness on exam. Her CT shows that her stent in her right kidney might be occluded. I discussed with urology, Dr. Pilar Jarvis, who has evaluated her chart and feels that this kidney is likely to come out anyway due to this cancer. Given otherwise normal renal function, she would be amenable to an outpatient workup and follow-up with Dr. [name] as scheduled next week. Treat her urinary tract infection and have her follow-up or otherwise return if any symptoms were to worsen or change. Discussed strict return precautions.  Final Clinical Impressions(s) / ED Diagnoses   Final diagnoses:  Acute cystitis with hematuria    New Prescriptions New Prescriptions   CEPHALEXIN (KEFLEX) 500 MG CAPSULE    Take 1 capsule (500 mg total) by mouth 2 (two) times daily.     Sherwood Gambler, MD 09/17/16 1920  8:52 PM While getting ready for discharge, [atient ambulated to bathroom, had trouble according to the  nurse. Patient states the dizziness today worse than last couple weeks and is spinning. Will give Antivert, get  MRI  9:17 PM Patient is now wanting to leave, concerned about getting home tonight. Wants to call a cab. I discussed concern for possible CNS pathology, including stroke or mass. She understands this but wants to leave. Understands risks of delayed or missed diagnosis. Of note, she is ambulating well in the hallway while I talk to her. Appears capable of medical decision making. Discussed she can return at any time. Discussed return precautions. Will d/c AMA>   Sherwood Gambler, MD 09/18/16 [phone]

## 2016-09-17 NOTE — ED Notes (Signed)
Pt demands to leave  Refuses pills  Check out AMA  Pt refuses V/s

## 2016-09-17 NOTE — ED Triage Notes (Signed)
Pt comes from [facility] via EMS with complaints of continuing abdominal/flank pain after a stent placement on right side radiating to her hip.  Today endorses intermittent left abdominal pain. Also stated she originally had hematuria before stent which went away for a few days and it has returned.  Pt also vocalizes a "foamy substance" in her vaginal area which she was recently placed on antibiotics for. Ambulatory A&O x4.

## 2016-09-18 ENCOUNTER — Telehealth: Payer: Self-pay | Admitting: Family Medicine

## 2016-09-18 NOTE — Telephone Encounter (Signed)
Please f/u on several piror referrals - may have even been dating back over 6 mos??  [facility] made pt a careplan last wk looks like.    Other option (which I have also given pt prior):  She can call DSS to see about their services or she can call [facility] [phone] NA services 2-3x/wk to assist with bathing, personal care, linen change,, light meal preparation, trash removal. SN annual assessment and Quarterly Supervisory visits. Goal improve and maintain good skin integrity, increase safety with ADLs. Needs to be connected wiht other community services to assist with meeting needs as well.

## 2016-09-18 NOTE — Telephone Encounter (Signed)
Patient needs her Restasis, fluticanson, and Nexium refilled at [facility]. She states she needs the purple pill not the green and yellow pill for the nexium because she can not take that one.  Also she stated Dr Brigitte Pulse was suppose to be helping her get some help at home and that her case worker has reached out but that Dr Brigitte Pulse has not gotten back to them.

## 2016-09-18 NOTE — Telephone Encounter (Signed)
Please provide verbal.

## 2016-09-18 NOTE — Telephone Encounter (Signed)
Isabel Collins from [facility] needing verbal order for extension of services. Please advise [phone]

## 2016-09-19 MED ORDER — FLUTICASONE PROPIONATE 50 MCG/ACT NA SUSP: 2.0000 | Freq: Every evening | NASAL | 3 refills | Status: AC | PRN

## 2016-09-19 MED ORDER — CYCLOSPORINE 0.05 % OP EMUL: 1.0000 [drp] | Freq: Two times a day (BID) | OPHTHALMIC | 3 refills | Status: AC

## 2016-09-19 MED ORDER — NEXIUM 40 MG PO CPDR: 40.0000 mg | DELAYED_RELEASE_CAPSULE | Freq: Every day | ORAL | 5 refills | Status: DC

## 2016-09-19 NOTE — Telephone Encounter (Signed)
See note

## 2016-09-19 NOTE — Telephone Encounter (Signed)
Called insurance to check PA status of Duloxetine. APPROVED as of 09/17/16

## 2016-09-19 NOTE — Telephone Encounter (Signed)
Yes, whatever orders for any home help are completely fine and medically necessary. Requested reflls sent in.

## 2016-09-20 LAB — URINE CULTURE

## 2016-09-20 NOTE — Telephone Encounter (Signed)
IC pt.  Advised her we will complete any forms when we get them and fax to [facility] when they are ready.  Advised pt she needs to call Urology and advise them she is still has urinary bleeding from the stent. Pt verbalized understanding.

## 2016-09-20 NOTE — Telephone Encounter (Signed)
Pt returning call, said someone from our office called her and was transferred to me. I did not call the pt. I did let her know about the option of talking to a Education officer, museum at [place] as well as DSS and she said already has and they cannot help her. She said her case worker Jethro Bastos was sending in papers for Dr. Brigitte Pulse to sign and fax to [place]. Pt said she is also still bleeding in urine from stint placed. Please advise.

## 2016-09-20 NOTE — Telephone Encounter (Signed)
IC pt.  Advised to let the SW know she needs the cooking/cleaning assistance.  She states Isabel Collins SW is working on getting her help.

## 2016-09-20 NOTE — Telephone Encounter (Signed)
Spoke with Danny Lawless, Home Health Coordinator, at [facility]. We referred the pt to them on 08/17/16 and I called to check to follow up. He said they have been seeing the pt and are still currently seeing her. He said as far as helping with home needs such as cooking and cleaning, they do not offer this but if we need additional resources, they have a Education officer, museum who could help the pt with this. She can call their office at [phone] and ask for the social worker.

## 2016-09-20 NOTE — Telephone Encounter (Signed)
Perfect. Will look out for the papers. Thanks.

## 2016-09-21 ENCOUNTER — Telehealth: Payer: Self-pay

## 2016-09-21 NOTE — Telephone Encounter (Signed)
Post ED Visit - Positive Culture Follow-up: Unsuccessful Patient Follow-up  Culture assessed and recommendations reviewed by:  []  Elenor Quinones, Pharm.D. []  Heide Guile, Pharm.D., BCPS AQ-ID []  Parks Neptune, Pharm.D., BCPS []  Alycia Rossetti, Pharm.D., BCPS []  [place], Pharm.D., BCPS, AAHIVP [x]  Legrand Como, Pharm.D., BCPS, AAHIVP []  Salome Arnt, PharmD, BCPS []  Dimitri Ped, PharmD, BCPS []  Vincenza Hews, PharmD, BCPS  Positive urine culture  []  Patient discharged without antimicrobial prescription and treatment is now indicated []  Organism is resistant to prescribed ED discharge antimicrobial []  Patient with positive blood cultures   Unable to contact patient after 3 attempts, letter will be sent to address on file  Genia Del 09/21/2016, 12:06 PM

## 2016-09-21 NOTE — Progress Notes (Signed)
ED Antimicrobial Stewardship Positive Culture Follow Up   Isabel Collins is an 76 y.o. female who presented to [facility] on 09/17/2016 with a chief complaint of  Chief Complaint  Patient presents with  . Abdominal Pain  . Hematuria    Recent Results (from the past 720 hour(s))  Urine culture     Status: Abnormal   Collection Time: 09/17/16  3:52 PM  Result Value Ref Range Status   Specimen Description URINE, RANDOM  Final   Special Requests NONE  Final   Culture (A)  Final    40,000 COLONIES/mL ESCHERICHIA COLI Confirmed Extended Spectrum Beta-Lactamase Producer (ESBL) Performed at [facility], [address] [place], [place] [address]    Report Status 09/20/2016 FINAL  Final   Organism ID, Bacteria ESCHERICHIA COLI (A)  Final      Susceptibility   Escherichia coli - MIC*    AMPICILLIN >=32 RESISTANT Resistant     CEFAZOLIN >=64 RESISTANT Resistant     CEFTRIAXONE >=64 RESISTANT Resistant     CIPROFLOXACIN >=4 RESISTANT Resistant     GENTAMICIN <=1 SENSITIVE Sensitive     IMIPENEM <=0.25 SENSITIVE Sensitive     NITROFURANTOIN <=16 SENSITIVE Sensitive     TRIMETH/SULFA >=320 RESISTANT Resistant     AMPICILLIN/SULBACTAM >=32 RESISTANT Resistant     PIP/TAZO 8 SENSITIVE Sensitive     Extended ESBL POSITIVE Resistant     * 40,000 COLONIES/mL ESCHERICHIA COLI    [x]  Treated with Cephalexin, organism resistant to prescribed antimicrobial []  Patient discharged originally without antimicrobial agent and treatment is now indicated  New antibiotic prescription:  Stop cephalexin Macrobid 100mg  PO BID x 7 days  ED Provider: Shary Decamp, PA-C   Norva Riffle 09/21/2016, 7:58 AM Infectious Diseases Pharmacist Phone# [phone]

## 2016-09-25 ENCOUNTER — Telehealth: Payer: Self-pay | Admitting: Family Medicine

## 2016-09-25 NOTE — Telephone Encounter (Signed)
Perfect. Thank you!

## 2016-09-25 NOTE — Telephone Encounter (Signed)
THIS CALL IS FROM AMANDA FROM [place] DR. SHAW: SHE NEEDS VERBAL ORDERS TO EXTEND HER NURSING CARE 2 TIMES A WEEK FOR 3 MORE WEEKS. THEN 1 TIME A WEEK FOR 2 MORE WEEKS. BEST PHONE [phone] ([place]) [place]

## 2016-09-25 NOTE — Telephone Encounter (Signed)
Left message on Amanda's VM given the okay for the orders that she was requesting. Advised to call back with any questions.  FYI

## 2016-10-01 ENCOUNTER — Encounter: Payer: Medicare Other | Admitting: Family Medicine

## 2016-10-01 ENCOUNTER — Telehealth: Payer: Self-pay | Admitting: Emergency Medicine

## 2016-10-01 DIAGNOSIS — R5381 Other malaise: Secondary | ICD-10-CM | POA: Insufficient documentation

## 2016-10-01 DIAGNOSIS — Z960 Presence of urogenital implants: Secondary | ICD-10-CM | POA: Insufficient documentation

## 2016-10-01 DIAGNOSIS — J449 Chronic obstructive pulmonary disease, unspecified: Secondary | ICD-10-CM | POA: Insufficient documentation

## 2016-10-01 DIAGNOSIS — K219 Gastro-esophageal reflux disease without esophagitis: Secondary | ICD-10-CM | POA: Insufficient documentation

## 2016-10-01 DIAGNOSIS — E039 Hypothyroidism, unspecified: Secondary | ICD-10-CM | POA: Insufficient documentation

## 2016-10-01 DIAGNOSIS — E118 Type 2 diabetes mellitus with unspecified complications: Secondary | ICD-10-CM | POA: Insufficient documentation

## 2016-10-01 DIAGNOSIS — C641 Malignant neoplasm of right kidney, except renal pelvis: Secondary | ICD-10-CM | POA: Insufficient documentation

## 2016-10-01 NOTE — Telephone Encounter (Signed)
Case Manager, Geni Bers called in from [facility] regarding missing FL2 forms.   Per Dr. Brigitte Pulse, she completed forms last week and placed in the fax bin.  Case manager will resend forms for completion

## 2016-10-01 NOTE — Progress Notes (Deleted)
Subjective:    Isabel Collins is a 76 y.o. female who presents for Medicare Annual/Subsequent preventive examination.  Preventive Screening-Counseling & Management  Tobacco History  Smoking Status  . Former Smoker  . Packs/day: 0.50  . Years: 46.00  . Types: Cigarettes  . Quit date: 03/05/2004  Smokeless Tobacco  . Never Used     Problems Prior to Visit 1. New diagnosis DM in April  2.  Vit D def - taking?  Current Problems (verified) Patient Active Problem List   Diagnosis Date Noted  . Dyspnea on exertion 06/25/2016  . Fibromyalgia 05/23/2016  . 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 Hypertension 02/10/2016  . High cholesterol 02/10/2016  . Hiatal hernia 02/10/2016    Medications Prior to Visit Current Outpatient Prescriptions on File Prior to Visit  Medication Sig Dispense Refill  . ADVAIR DISKUS 250-50 MCG/DOSE AEPB Inhale 1 puff into the lungs 2 (two) times daily as needed (shortness of breath).   1  . albuterol (PROVENTIL HFA;VENTOLIN HFA) 108 (90 Base) MCG/ACT inhaler Inhale 1-2 puffs into the lungs every 6 (six) hours as needed for wheezing. 1 Inhaler 0  . atorvastatin (LIPITOR) 40 MG tablet Take 1 tablet (40 mg total) by mouth every evening. (Patient taking differently: Take 40 mg by mouth every morning. ) 90 tablet 0  . bisacodyl (DULCOLAX) 5 MG EC tablet Take 5 mg by mouth at bedtime as needed for moderate constipation.     . cholecalciferol (VITAMIN D) 400 units TABS tablet Take 400 Units by mouth.    . cycloSPORINE (RESTASIS) 0.05 % ophthalmic emulsion Place 1 drop into both eyes 2 (two) times daily. 0.4 mL 3  . DULoxetine (CYMBALTA) 30 MG capsule Take two capsules po in the morning and one capsule po in the afternoon. 270 capsule 0  . fluticasone (FLONASE) 50 MCG/ACT nasal spray Place 2 sprays into both nostrils at bedtime as needed for allergies or rhinitis. 16 g 3  . HYDROcodone-acetaminophen (NORCO) 7.5-325 MG  tablet Take 1-2 tablets by mouth every 4 (four) hours as needed for moderate pain. Maximum dose per 24 hours - 8 pills (Patient not taking: Reported on 09/17/2016) 20 tablet 0  . HYDROcodone-acetaminophen (NORCO/VICODIN) 5-325 MG tablet Take 1-2 tablets by mouth every 4 (four) hours as needed for moderate pain. (Patient not taking: Reported on 09/17/2016) 60 tablet 0  . levothyroxine (SYNTHROID, LEVOTHROID) 75 MCG tablet Take 1 tablet (75 mcg total) by mouth daily before breakfast. 90 tablet 1  . metoprolol succinate (TOPROL-XL) 100 MG 24 hr tablet Take 1 tablet (100 mg total) by mouth daily. Take with or immediately following a meal. 90 tablet 0  . Multiple Vitamin (ONE-A-DAY 55 PLUS PO) Take 1 tablet by mouth daily.    Marland Kitchen NEXIUM 40 MG capsule Take 1 capsule (40 mg total) by mouth daily. 30 capsule 5  . nitrofurantoin, macrocrystal-monohydrate, (MACROBID) 100 MG capsule Take 100 mg by mouth 2 (two) times daily.  0  . Oxycodone HCl 10 MG TABS Take 10-20 mg by mouth every 4 (four) hours.  0  . phenazopyridine (PYRIDIUM) 200 MG tablet Take 1 tablet (200 mg total) by mouth 3 (three) times daily as needed for pain. (Patient not taking: Reported on 09/17/2016) 30 tablet 0  . polyvinyl alcohol (LIQUIFILM TEARS) 1.4 % ophthalmic solution Place 1 drop into both eyes every 4 (four) hours as needed for dry eyes.    . pregabalin (LYRICA) 150 MG capsule Take 1 capsule (  150 mg total) by mouth 2 (two) times daily. 180 capsule 0  . PRESCRIPTION MEDICATION Take 0.25 tablets by mouth at bedtime as needed (SLEEP). Sleep Medicine, patient is unsure of name or strength.    . traZODone (DESYREL) 50 MG tablet Take 12.5 mg by mouth at bedtime as needed for sleep.      No current facility-administered medications on file prior to visit.     Current Medications (verified) Current Outpatient Prescriptions  Medication Sig Dispense Refill  . ADVAIR DISKUS 250-50 MCG/DOSE AEPB Inhale 1 puff into the lungs 2 (two) times daily as  needed (shortness of breath).   1  . albuterol (PROVENTIL HFA;VENTOLIN HFA) 108 (90 Base) MCG/ACT inhaler Inhale 1-2 puffs into the lungs every 6 (six) hours as needed for wheezing. 1 Inhaler 0  . atorvastatin (LIPITOR) 40 MG tablet Take 1 tablet (40 mg total) by mouth every evening. (Patient taking differently: Take 40 mg by mouth every morning. ) 90 tablet 0  . bisacodyl (DULCOLAX) 5 MG EC tablet Take 5 mg by mouth at bedtime as needed for moderate constipation.     . cholecalciferol (VITAMIN D) 400 units TABS tablet Take 400 Units by mouth.    . cycloSPORINE (RESTASIS) 0.05 % ophthalmic emulsion Place 1 drop into both eyes 2 (two) times daily. 0.4 mL 3  . DULoxetine (CYMBALTA) 30 MG capsule Take two capsules po in the morning and one capsule po in the afternoon. 270 capsule 0  . fluticasone (FLONASE) 50 MCG/ACT nasal spray Place 2 sprays into both nostrils at bedtime as needed for allergies or rhinitis. 16 g 3  . HYDROcodone-acetaminophen (NORCO) 7.5-325 MG tablet Take 1-2 tablets by mouth every 4 (four) hours as needed for moderate pain. Maximum dose per 24 hours - 8 pills (Patient not taking: Reported on 09/17/2016) 20 tablet 0  . HYDROcodone-acetaminophen (NORCO/VICODIN) 5-325 MG tablet Take 1-2 tablets by mouth every 4 (four) hours as needed for moderate pain. (Patient not taking: Reported on 09/17/2016) 60 tablet 0  . levothyroxine (SYNTHROID, LEVOTHROID) 75 MCG tablet Take 1 tablet (75 mcg total) by mouth daily before breakfast. 90 tablet 1  . metoprolol succinate (TOPROL-XL) 100 MG 24 hr tablet Take 1 tablet (100 mg total) by mouth daily. Take with or immediately following a meal. 90 tablet 0  . Multiple Vitamin (ONE-A-DAY 55 PLUS PO) Take 1 tablet by mouth daily.    Marland Kitchen NEXIUM 40 MG capsule Take 1 capsule (40 mg total) by mouth daily. 30 capsule 5  . nitrofurantoin, macrocrystal-monohydrate, (MACROBID) 100 MG capsule Take 100 mg by mouth 2 (two) times daily.  0  . Oxycodone HCl 10 MG TABS Take  10-20 mg by mouth every 4 (four) hours.  0  . phenazopyridine (PYRIDIUM) 200 MG tablet Take 1 tablet (200 mg total) by mouth 3 (three) times daily as needed for pain. (Patient not taking: Reported on 09/17/2016) 30 tablet 0  . polyvinyl alcohol (LIQUIFILM TEARS) 1.4 % ophthalmic solution Place 1 drop into both eyes every 4 (four) hours as needed for dry eyes.    . pregabalin (LYRICA) 150 MG capsule Take 1 capsule (150 mg total) by mouth 2 (two) times daily. 180 capsule 0  . PRESCRIPTION MEDICATION Take 0.25 tablets by mouth at bedtime as needed (SLEEP). Sleep Medicine, patient is unsure of name or strength.    . traZODone (DESYREL) 50 MG tablet Take 12.5 mg by mouth at bedtime as needed for sleep.      No current  facility-administered medications for this visit.      Allergies (verified) Patient has no known allergies.   PAST HISTORY  Family History Family History  Problem Relation Age of Onset  . Diabetes Mother   . Heart disease Mother   . Diabetes Father   . Heart disease Father   . Diabetes Sister   . Diabetes Brother        x3  . Colon cancer Neg Hx   . Esophageal cancer Neg Hx   . Stomach cancer Neg Hx   . Pancreatic cancer Neg Hx     Social History Social History  Substance Use Topics  . Smoking status: Former Smoker    Packs/day: 0.50    Years: 46.00    Types: Cigarettes    Quit date: 03/05/2004  . Smokeless tobacco: Never Used  . Alcohol use No     Are there smokers in your home (other than you)? {yes/no:20286}  Risk Factors Current exercise habits: {exercise:19826}  Dietary issues discussed: ***   Cardiac risk factors: {risk factors:510}.  Depression Screen (Note: if answer to either of the following is "Yes", a more complete depression screening is indicated)   Over the past two weeks, have you felt down, depressed or hopeless? {yes/no:20286}  Over the past two weeks, have you felt little interest or pleasure in doing things? {yes/no:20286}  Have you  lost interest or pleasure in daily life? {yes/no:20286}  Do you often feel hopeless? {yes/no:20286}  Do you cry easily over simple problems? {yes/no:20286}  Activities of Daily Living In your present state of health, do you have any difficulty performing the following activities?:  Driving? {yes/no:20286} Managing money?  {Responses; yes/no (default no):140031::"No"} Feeding yourself? {yes/no:20286} Getting from bed to chair? {yes/no:20286}{exam, Complete:18323} Climbing a flight of stairs? {yes/no:20286} Preparing food and eating?: {yes/no (default no):140031::"No"} Bathing or showering? {Responses; yes/no (default no):140031::"No"} Getting dressed: {yes/no (default no):140031::"No"} Getting to the toilet? {Responses; yes/no (default no):140031::"No"} Using the toilet:{yes/no (default no):140031::"No"} Moving around from place to place: {yes/no (default no):140031::"No"} In the past year have you fallen or had a near fall?:{yes/no (default no):140031::"No"}   Are you sexually active?  {yes/no:20286}  Do you have more than one partner?  {Responses; yes/no (default no):140031::"No"}  Hearing Difficulties: {yes/no (default no):140031::"No"} Do you often ask people to speak up or repeat themselves? {Responses; yes/no (default no):140031::"No"} Do you experience ringing or noises in your ears? {Responses; yes/no (default no):140031::"No"} Do you have difficulty understanding soft or whispered voices? {Responses; yes/no (default no):140031::"No"}   Do you feel that you have a problem with memory? {yes/no:20286}  Do you often misplace items? {yes/no:20286}  Do you feel safe at home?  {yes/no:20286}  Cognitive Testing  Alert? {yes/no:20286}  Normal Appearance?{yes/no:20286}  Oriented to person? {yes/no:20286}  Place? {yes/no:20286}   Time? {yes/no:20286}  Recall of three objects?  {yes/no:20286}  Can perform simple calculations? {yes/no:20286}  Displays appropriate  judgment?{yes/no:20286}  Can read the correct time from a watch face?{yes/no:20286}   Advanced Directives have been discussed with the patient? {yes/no:20286}  List the Names of Other Physician/Practitioners you currently use: 1.    Indicate any recent Medical Services you may have received from other than [facility] providers in the past year (date may be approximate).   There is no immunization history on file for this patient.  Screening Tests Health Maintenance  Topic Date Due  . FOOT EXAM  09/22/1950  . OPHTHALMOLOGY EXAM  09/22/1950  . URINE MICROALBUMIN  09/22/1950  . TETANUS/TDAP  09/23/1958  .  DEXA SCAN  09/22/2004  . PNA vac Low Risk Adult (1 of 2 - PCV13) 09/22/2004  . INFLUENZA VACCINE  10/03/2016  . HEMOGLOBIN A1C  12/04/2016    All answers were reviewed with the patient and necessary referrals were made:  SHAW,EVA, MD   10/01/2016   History reviewed: {history reviewed:20406::"allergies","current medications","past family history","past medical history","past social history","past surgical history","problem list"}  Review of Systems {ros; complete:30496}    Objective:     Vision by Snellen chart: right eye:{vision:19455::"20/20"}, left eye:{vision:19455::"20/20"}  There is no height or weight on file to calculate BMI. There were no vitals taken for this visit.  {Exam, female:18323}     Assessment:     ***     Plan:     During the course of the visit the patient was educated and counseled about appropriate screening and preventive services including:    {plan:19836}  Diet review for nutrition referral? Yes ____  Not Indicated ____   Patient Instructions (the written plan) was given to the patient.  Medicare Attestation I have personally reviewed: The patient's medical and social history Their use of alcohol, tobacco or illicit drugs Their current medications and supplements The patient's functional ability including ADLs,fall risks, home  safety risks, cognitive, and hearing and visual impairment Diet and physical activities Evidence for depression or mood disorders  The patient's weight, height, BMI, and visual acuity have been recorded in the chart.  I have made referrals, counseling, and provided education to the patient based on review of the above and I have provided the patient with a written personalized care plan for preventive services.     [name], MD   10/01/2016

## 2016-10-03 ENCOUNTER — Telehealth: Payer: Self-pay | Admitting: Family Medicine

## 2016-10-03 NOTE — Telephone Encounter (Signed)
Isabel Collins with [facility] states she has some concerns about the pt and would like you to give her a call.  She also states pt wanted to end PT for about two weeks.  Contact number [phone]

## 2016-10-04 NOTE — Telephone Encounter (Signed)
Please Advise

## 2016-10-05 ENCOUNTER — Other Ambulatory Visit: Payer: Self-pay | Admitting: Urology

## 2016-10-05 ENCOUNTER — Encounter: Payer: Self-pay | Admitting: Family Medicine

## 2016-10-05 DIAGNOSIS — Z9181 History of falling: Secondary | ICD-10-CM | POA: Insufficient documentation

## 2016-10-05 NOTE — Telephone Encounter (Signed)
Returned call and LVM for Liji with my cell # or to leave message with TL or Briona if I am not in the office

## 2016-10-08 ENCOUNTER — Ambulatory Visit (INDEPENDENT_AMBULATORY_CARE_PROVIDER_SITE_OTHER): Payer: Medicare Other | Admitting: Emergency Medicine

## 2016-10-08 ENCOUNTER — Telehealth: Payer: Self-pay | Admitting: Family Medicine

## 2016-10-08 ENCOUNTER — Encounter: Payer: Self-pay | Admitting: Emergency Medicine

## 2016-10-08 VITALS — BP 120/70 | HR 75 | Temp 98.2°F | Resp 16 | Ht 61.0 in | Wt 136.0 lb

## 2016-10-08 DIAGNOSIS — R109 Unspecified abdominal pain: Secondary | ICD-10-CM | POA: Diagnosis not present

## 2016-10-08 DIAGNOSIS — G894 Chronic pain syndrome: Secondary | ICD-10-CM

## 2016-10-08 DIAGNOSIS — N2889 Other specified disorders of kidney and ureter: Secondary | ICD-10-CM | POA: Insufficient documentation

## 2016-10-08 DIAGNOSIS — R5381 Other malaise: Secondary | ICD-10-CM

## 2016-10-08 DIAGNOSIS — G8929 Other chronic pain: Secondary | ICD-10-CM | POA: Insufficient documentation

## 2016-10-08 MED ORDER — HYDROCODONE-ACETAMINOPHEN 5-325 MG PO TABS: 1.0000 | ORAL_TABLET | Freq: Four times a day (QID) | ORAL | 0 refills | Status: DC | PRN

## 2016-10-08 MED ORDER — NEXIUM 40 MG PO CPDR: 40.0000 mg | DELAYED_RELEASE_CAPSULE | Freq: Every day | ORAL | 5 refills | Status: AC

## 2016-10-08 NOTE — Patient Instructions (Signed)
     IF you received an x-ray today, you will receive an invoice from [facility]. Please contact [facility] at [phone] with questions or concerns regarding your invoice.   IF you received labwork today, you will receive an invoice from [facility]. Please contact [facility] at [phone] with questions or concerns regarding your invoice.   Our billing staff will not be able to assist you with questions regarding bills from these companies.  You will be contacted with the lab results as soon as they are available. The fastest way to get your results is to activate your My Chart account. Instructions are located on the last page of this paperwork. If you have not heard from us regarding the results in 2 weeks, please contact this office.     

## 2016-10-08 NOTE — Progress Notes (Signed)
Estephany Perot Strojny 76 y.o.   Chief Complaint  Patient presents with  . Establish Care  . Breast Mass    RIGHT x 2 years    HISTORY OF PRESENT ILLNESS: This is a 76 y.o. female with multiple medical problems wants to see a different physician today; upset with Dr. Brigitte Pulse. States she has blood in urine; she thinks she has kidney cancer; scheduled for surgery later this month. Has chronic pain mostly right flank. Has h/o mass to right breast x 2 years. Requesting Nexium and pain medication.   HPI   Prior to Admission medications   Medication Sig Start Date End Date Taking? Authorizing Provider  ADVAIR DISKUS 250-50 MCG/DOSE AEPB Inhale 1 puff into the lungs 2 (two) times daily as needed (shortness of breath).  06/06/16  Yes [provider]  albuterol (PROVENTIL HFA;VENTOLIN HFA) 108 (90 Base) MCG/ACT inhaler Inhale 1-2 puffs into the lungs every 6 (six) hours as needed for wheezing. 05/20/16  Yes Larene Pickett, PA-C  atorvastatin (LIPITOR) 40 MG tablet Take 1 tablet (40 mg total) by mouth every evening. Patient taking differently: Take 40 mg by mouth every morning.  09/08/16  Yes [name], MD  bisacodyl (DULCOLAX) 5 MG EC tablet Take 5 mg by mouth at bedtime as needed for moderate constipation.    Yes [provider]  cholecalciferol (VITAMIN D) 400 units TABS tablet Take 400 Units by mouth.   Yes [provider]  cycloSPORINE (RESTASIS) 0.05 % ophthalmic emulsion Place 1 drop into both eyes 2 (two) times daily. 09/19/16  Yes [name], MD  DULoxetine (CYMBALTA) 30 MG capsule Take two capsules po in the morning and one capsule po in the afternoon. 09/08/16  Yes [name], MD  fluticasone [facility]) 50 MCG/ACT nasal spray Place 2 sprays into both nostrils at bedtime as needed for allergies or rhinitis. 09/19/16  Yes [name], MD  HYDROcodone-acetaminophen (NORCO) 7.5-325 MG tablet Take 1-2 tablets by mouth every 4 (four) hours as needed for moderate pain. Maximum  dose per 24 hours - 8 pills 08/27/16  Yes Kathie Rhodes, MD  levothyroxine (SYNTHROID, LEVOTHROID) 75 MCG tablet Take 1 tablet (75 mcg total) by mouth daily before breakfast. 09/08/16  Yes [name], MD  metoprolol succinate (TOPROL-XL) 100 MG 24 hr tablet Take 1 tablet (100 mg total) by mouth daily. Take with or immediately following a meal. 09/08/16  Yes [name], MD  Multiple Vitamin (ONE-A-DAY 55 PLUS PO) Take 1 tablet by mouth daily.   Yes [provider]  NEXIUM 40 MG capsule Take 1 capsule (40 mg total) by mouth daily. 09/19/16  Yes [name], MD  polyvinyl alcohol (LIQUIFILM TEARS) 1.4 % ophthalmic solution Place 1 drop into both eyes every 4 (four) hours as needed for dry eyes.   Yes [provider]  traZODone (DESYREL) 50 MG tablet Take 12.5 mg by mouth at bedtime as needed for sleep.    Yes [provider]  HYDROcodone-acetaminophen (NORCO/VICODIN) 5-325 MG tablet Take 1-2 tablets by mouth every 4 (four) hours as needed for moderate pain. Patient not taking: Reported on 09/17/2016 07/26/16   [name], MD  nitrofurantoin, macrocrystal-monohydrate, (MACROBID) 100 MG capsule Take 100 mg by mouth 2 (two) times daily. 09/03/16   [provider]  Oxycodone HCl 10 MG TABS Take 10-20 mg by mouth every 4 (four) hours. 09/03/16   [provider]  phenazopyridine (PYRIDIUM) 200 MG tablet Take  1 tablet (200 mg total) by mouth 3 (three) times daily as needed for pain. Patient not taking: Reported on 09/17/2016 08/27/16   Kathie Rhodes, MD  pregabalin (LYRICA) 150 MG capsule Take 1 capsule (150 mg total) by mouth 2 (two) times daily. 09/08/16   [name], MD  PRESCRIPTION MEDICATION Take 0.25 tablets by mouth at bedtime as needed (SLEEP). Sleep Medicine, patient is unsure of name or strength.    [provider]    No Known Allergies  Patient Active Problem List   Diagnosis Date Noted  . History of recent fall 10/05/2016  . Physical deconditioning  10/01/2016  . Hypothyroidism 10/01/2016  . Chronic obstructive pulmonary disease ([place]) 10/01/2016  . Type 2 diabetes mellitus with complication, without long-term current use of insulin ([place]) 10/01/2016  . 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 Gastroesophageal reflux disease 10/01/2016  . Dyspnea on exertion 06/25/2016  . Fibromyalgia 05/23/2016  . 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 Hypertension 02/10/2016  . High cholesterol 02/10/2016  . Hiatal hernia 02/10/2016    Past Medical History:  Diagnosis Date  . Anemia   . Anxiety   . Chronic constipation   . COPD (chronic obstructive pulmonary disease) ([place])   . Degenerative arthritis of spine   . Diverticulosis of colon   . Fibromyalgia   . GERD (gastroesophageal reflux disease)   . Gross hematuria   . Hiatal hernia   . History of chronic bronchitis   . History of colon polyps   . Hyperlipidemia   . Hypertension   . Hypothyroidism   . IBS (irritable bowel syndrome)   . OA (osteoarthritis)    hips  . Renal mass, right     Past Surgical History:  Procedure Laterality Date  . arm surgery Left   . BREAST REDUCTION SURGERY Bilateral   . CHOLECYSTECTOMY    . CYSTOSCOPY WITH BIOPSY Right 08/27/2016   Procedure: CYSTOSCOPY WITH BIOPSY;  Surgeon: Kathie Rhodes, MD;  Location: [facility];  Service: Urology;  Laterality: Right;  . CYSTOSCOPY WITH RETROGRADE PYELOGRAM, URETEROSCOPY AND STENT PLACEMENT Right 08/27/2016   Procedure: CYSTOSCOPY WITH RIGHT RETROGRADE PYELOGRAM, RIGHT URETEROSCOPY AND STENT PLACEMENT;  Surgeon: Kathie Rhodes, MD;  Location: [facility];  Service: Urology;  Laterality: Right;  . REPLACEMENT TOTAL KNEE Left   . TOTAL SHOULDER REPLACEMENT Right   . TUBAL LIGATION      Social History   Social History  . Marital status: Divorced    Spouse name: N/A  . Number of  children: N/A  . Years of education: N/A   Occupational History  . Not on file.   Social History Main Topics  . Smoking status: Former Smoker    Packs/day: 0.50    Years: 46.00    Types: Cigarettes    Quit date: 03/05/2004  . Smokeless tobacco: Never Used  . Alcohol use No  . Drug use: No  . Sexual activity: Not on file   Other Topics Concern  . Not on file   Social History Narrative  . No narrative on file    Family History  Problem Relation Age of Onset  . Diabetes Mother   . Heart disease Mother   . Diabetes Father   . Heart disease Father   . Diabetes Sister   . Diabetes Brother        x3  . Colon cancer Neg Hx   .  Esophageal cancer Neg Hx   . Stomach cancer Neg Hx   . Pancreatic cancer Neg Hx      Review of Systems  Constitutional: Negative for chills and fever.  Respiratory: Negative for cough and shortness of breath.   Cardiovascular: Negative for chest pain.  Gastrointestinal: Negative for abdominal pain, nausea and vomiting.  Genitourinary:       Chronic right flank pain.  Skin: Negative for rash.  Neurological: Negative for dizziness and headaches.    Vitals:   10/08/16 1532  BP: 120/70  Pulse: 75  Resp: 16  Temp: 98.2 F (36.8 C)    Physical Exam  Constitutional: She is oriented to person, place, and time. She appears well-developed and well-nourished.  Ambulates with a cane.  HENT:  Head: Normocephalic and atraumatic.  Eyes: Pupils are equal, round, and reactive to light. Conjunctivae and EOM are normal.  Neck: Normal range of motion. Neck supple. No JVD present.  Cardiovascular: Normal rate, regular rhythm and normal heart sounds.   Pulmonary/Chest: Effort normal and breath sounds normal.  Abdominal: Soft. Bowel sounds are normal. She exhibits no distension. There is no tenderness.  Lymphadenopathy:    She has no cervical adenopathy.  Neurological: She is alert and oriented to person, place, and time.  Skin: Skin is warm and dry.  Capillary refill takes less than 2 seconds. No rash noted.  Psychiatric: She has a normal mood and affect. Her behavior is normal.  Vitals reviewed.    ASSESSMENT & PLAN: I spoke at length with patient and advised her that it's in her best interest to continue care with Dr. Brigitte Pulse who knows her case well and has been actively involved in her day-to-day care for some time now. She understands and agrees. No transfer of care necessary at this point.  Kareem was seen today for establish care and breast mass.  Diagnoses and all orders for this visit:  Right renal mass  Right flank pain, chronic -     Ambulatory referral to Pain Clinic  Chronic pain syndrome  Other orders -     NEXIUM 40 MG capsule; Take 1 capsule (40 mg total) by mouth daily. -     HYDROcodone-acetaminophen (NORCO/VICODIN) 5-325 MG tablet; Take 1 tablet by mouth every 6 (six) hours as needed for moderate pain.      Agustina Caroli, MD [facility]

## 2016-10-08 NOTE — Telephone Encounter (Signed)
Isabel Collins with [facility] would like you to give her a call, states she has some questions about the order that was given. Also wanted to let you know that the pt appt was missed last week because the pt was uncooperative and she was not able to sched a time to come out.  Contact number [phone]

## 2016-10-09 ENCOUNTER — Telehealth: Payer: Self-pay | Admitting: Family Medicine

## 2016-10-09 DIAGNOSIS — R5381 Other malaise: Secondary | ICD-10-CM

## 2016-10-09 DIAGNOSIS — M47817 Spondylosis without myelopathy or radiculopathy, lumbosacral region: Secondary | ICD-10-CM

## 2016-10-09 DIAGNOSIS — Z9181 History of falling: Secondary | ICD-10-CM

## 2016-10-09 DIAGNOSIS — K5909 Other constipation: Secondary | ICD-10-CM

## 2016-10-09 DIAGNOSIS — Z1211 Encounter for screening for malignant neoplasm of colon: Secondary | ICD-10-CM

## 2016-10-09 NOTE — Telephone Encounter (Signed)
Pt is also wanting to know if she can get  A mild nerve pill to relax her she is in so much pain she can not sleep    Best number [phone]

## 2016-10-09 NOTE — Telephone Encounter (Signed)
DR SAGARDIA PT CALLING YOU BACK STATING THAT SHE USES A CANE YOU WANTED TO KNOW WHAT SHE USES TO HELP HER WALK   AND WOULD LIKE S WALKER BUT HAS TO FILE THROUGH MEDICARE SHE ALSO STATES THAT ITS HARD FOR HER TO PASS STOOL SHE MAY NEED A COLONOSCOPY  SHE HAS SEEN BLACK STOOL AND LAST WEEK IT WAS REB BLOOD IN STOOL WHEN SHE WAS STRAINING  YESTERDAY HER STOOL WAS WATERY REGULAR COLOR WITH ROUND BALLS IN IT PLEAE CALL PT BACK

## 2016-10-09 NOTE — Telephone Encounter (Signed)
Referral placed. Fine to give order for a walker due to Dx (are already in problem list): M47.817 Spondylosis without myelopathy or radiculopathy, lumbosacral region; R53.81 Physical deconditioning; Z91.81 History of recent fall  GI referral placed but recommend pt come in to be seen for this more acutely which may help her get a more timely evaluation, work-up, appt.

## 2016-10-09 NOTE — Telephone Encounter (Signed)
Please advise 

## 2016-10-10 ENCOUNTER — Telehealth: Payer: Self-pay | Admitting: Family Medicine

## 2016-10-10 NOTE — Telephone Encounter (Signed)
PT CALLED BACK I ADVISED HER THAT HER WALKER WAS ORDERED AND THAT A REFERRAL WAS DONE FOR GI PT STATES THAT SHE ALREADY TOLD THE GI DOCTOR THAT SHE CANT MAKE APPOINTMENT YET CAUSE SHE HAS TO GO TO HOSPITAL SHE WILL JUST WAIT ON WALKER TO COME TO HER HOME

## 2016-10-11 ENCOUNTER — Encounter (HOSPITAL_COMMUNITY): Payer: Self-pay

## 2016-10-11 ENCOUNTER — Emergency Department (HOSPITAL_COMMUNITY): Payer: Medicare Other

## 2016-10-11 ENCOUNTER — Inpatient Hospital Stay (HOSPITAL_COMMUNITY)
Admission: EM | Admit: 2016-10-11 | Discharge: 2016-10-16 | DRG: 690 | Disposition: A | Discharge status: Skilled Nursing Facility | Payer: Medicare Other | Attending: Internal Medicine | Admitting: Internal Medicine

## 2016-10-11 DIAGNOSIS — N10 Acute tubulo-interstitial nephritis: Principal | ICD-10-CM | POA: Diagnosis present

## 2016-10-11 DIAGNOSIS — Z96611 Presence of right artificial shoulder joint: Secondary | ICD-10-CM | POA: Diagnosis present

## 2016-10-11 DIAGNOSIS — Z79891 Long term (current) use of opiate analgesic: Secondary | ICD-10-CM

## 2016-10-11 DIAGNOSIS — I10 Essential (primary) hypertension: Secondary | ICD-10-CM | POA: Diagnosis present

## 2016-10-11 DIAGNOSIS — C641 Malignant neoplasm of right kidney, except renal pelvis: Secondary | ICD-10-CM | POA: Diagnosis present

## 2016-10-11 DIAGNOSIS — G894 Chronic pain syndrome: Secondary | ICD-10-CM | POA: Diagnosis present

## 2016-10-11 DIAGNOSIS — E1165 Type 2 diabetes mellitus with hyperglycemia: Secondary | ICD-10-CM | POA: Diagnosis present

## 2016-10-11 DIAGNOSIS — J449 Chronic obstructive pulmonary disease, unspecified: Secondary | ICD-10-CM | POA: Diagnosis present

## 2016-10-11 DIAGNOSIS — E118 Type 2 diabetes mellitus with unspecified complications: Secondary | ICD-10-CM | POA: Diagnosis present

## 2016-10-11 DIAGNOSIS — Z960 Presence of urogenital implants: Secondary | ICD-10-CM | POA: Diagnosis not present

## 2016-10-11 DIAGNOSIS — F419 Anxiety disorder, unspecified: Secondary | ICD-10-CM | POA: Diagnosis present

## 2016-10-11 DIAGNOSIS — B9620 Unspecified Escherichia coli [E. coli] as the cause of diseases classified elsewhere: Secondary | ICD-10-CM | POA: Diagnosis present

## 2016-10-11 DIAGNOSIS — E7800 Pure hypercholesterolemia, unspecified: Secondary | ICD-10-CM | POA: Diagnosis present

## 2016-10-11 DIAGNOSIS — K581 Irritable bowel syndrome with constipation: Secondary | ICD-10-CM | POA: Diagnosis present

## 2016-10-11 DIAGNOSIS — Z96652 Presence of left artificial knee joint: Secondary | ICD-10-CM | POA: Diagnosis present

## 2016-10-11 DIAGNOSIS — M797 Fibromyalgia: Secondary | ICD-10-CM | POA: Diagnosis present

## 2016-10-11 DIAGNOSIS — K589 Irritable bowel syndrome without diarrhea: Secondary | ICD-10-CM | POA: Diagnosis present

## 2016-10-11 DIAGNOSIS — Z833 Family history of diabetes mellitus: Secondary | ICD-10-CM

## 2016-10-11 DIAGNOSIS — E039 Hypothyroidism, unspecified: Secondary | ICD-10-CM | POA: Diagnosis present

## 2016-10-11 DIAGNOSIS — Z8249 Family history of ischemic heart disease and other diseases of the circulatory system: Secondary | ICD-10-CM

## 2016-10-11 DIAGNOSIS — K219 Gastro-esophageal reflux disease without esophagitis: Secondary | ICD-10-CM | POA: Diagnosis present

## 2016-10-11 DIAGNOSIS — K5900 Constipation, unspecified: Secondary | ICD-10-CM

## 2016-10-11 DIAGNOSIS — Z7951 Long term (current) use of inhaled steroids: Secondary | ICD-10-CM

## 2016-10-11 DIAGNOSIS — Z79899 Other long term (current) drug therapy: Secondary | ICD-10-CM | POA: Diagnosis not present

## 2016-10-11 DIAGNOSIS — E8809 Other disorders of plasma-protein metabolism, not elsewhere classified: Secondary | ICD-10-CM | POA: Diagnosis present

## 2016-10-11 DIAGNOSIS — K5909 Other constipation: Secondary | ICD-10-CM | POA: Diagnosis not present

## 2016-10-11 DIAGNOSIS — E871 Hypo-osmolality and hyponatremia: Secondary | ICD-10-CM | POA: Diagnosis present

## 2016-10-11 DIAGNOSIS — E785 Hyperlipidemia, unspecified: Secondary | ICD-10-CM | POA: Diagnosis present

## 2016-10-11 DIAGNOSIS — Z87891 Personal history of nicotine dependence: Secondary | ICD-10-CM | POA: Diagnosis not present

## 2016-10-11 DIAGNOSIS — M160 Bilateral primary osteoarthritis of hip: Secondary | ICD-10-CM | POA: Diagnosis present

## 2016-10-11 DIAGNOSIS — I5032 Chronic diastolic (congestive) heart failure: Secondary | ICD-10-CM | POA: Diagnosis not present

## 2016-10-11 LAB — URINALYSIS, ROUTINE W REFLEX MICROSCOPIC
Bilirubin Urine: NEGATIVE
GLUCOSE, UA: NEGATIVE mg/dL
Ketones, ur: NEGATIVE mg/dL
NITRITE: NEGATIVE
PH: 7 (ref 5.0–8.0)
Protein, ur: 100 mg/dL — AB
SPECIFIC GRAVITY, URINE: 1.005 (ref 1.005–1.030)

## 2016-10-11 LAB — CBC WITH DIFFERENTIAL/PLATELET
BASOS ABS: 0 10*3/uL (ref 0.0–0.1)
BASOS PCT: 0 %
EOS ABS: 0.1 10*3/uL (ref 0.0–0.7)
EOS PCT: 1 %
HCT: 40.1 % (ref 36.0–46.0)
Hemoglobin: 12.8 g/dL (ref 12.0–15.0)
Lymphocytes Relative: 17 %
Lymphs Abs: 2.1 10*3/uL (ref 0.7–4.0)
MCH: 26.6 pg (ref 26.0–34.0)
MCHC: 31.9 g/dL (ref 30.0–36.0)
MCV: 83.4 fL (ref 78.0–100.0)
MONO ABS: 1.3 10*3/uL — AB (ref 0.1–1.0)
MONOS PCT: 10 %
Neutro Abs: 9 10*3/uL — ABNORMAL HIGH (ref 1.7–7.7)
Neutrophils Relative %: 72 %
PLATELETS: 238 10*3/uL (ref 150–400)
RBC: 4.81 MIL/uL (ref 3.87–5.11)
RDW: 13.9 % (ref 11.5–15.5)
WBC: 12.5 10*3/uL — ABNORMAL HIGH (ref 4.0–10.5)

## 2016-10-11 LAB — COMPREHENSIVE METABOLIC PANEL
ALBUMIN: 3 g/dL — AB (ref 3.5–5.0)
ALT: 8 U/L — ABNORMAL LOW (ref 14–54)
ANION GAP: 9 (ref 5–15)
AST: 20 U/L (ref 15–41)
Alkaline Phosphatase: 88 U/L (ref 38–126)
BILIRUBIN TOTAL: 1 mg/dL (ref 0.3–1.2)
BUN: 5 mg/dL — AB (ref 6–20)
CHLORIDE: 95 mmol/L — AB (ref 101–111)
CO2: 30 mmol/L (ref 22–32)
Calcium: 8.9 mg/dL (ref 8.9–10.3)
Creatinine, Ser: 0.98 mg/dL (ref 0.44–1.00)
GFR calc Af Amer: >60 mL/min (ref >60)
GFR calc non Af Amer: 54 mL/min — ABNORMAL LOW (ref >60)
GLUCOSE: 126 mg/dL — AB (ref 65–99)
POTASSIUM: 3.6 mmol/L (ref 3.5–5.1)
Sodium: 134 mmol/L — ABNORMAL LOW (ref 135–145)
TOTAL PROTEIN: 6.2 g/dL — AB (ref 6.5–8.1)

## 2016-10-11 LAB — I-STAT CG4 LACTIC ACID, ED
LACTIC ACID, VENOUS: 2.01 mmol/L — AB (ref 0.5–1.9)
Lactic Acid, Venous: 1.27 mmol/L (ref 0.5–1.9)

## 2016-10-11 MED ORDER — PIPERACILLIN-TAZOBACTAM 4.5 G IVPB: 4.5000 g | Freq: Once | INTRAVENOUS | Status: DC

## 2016-10-11 MED ORDER — SODIUM CHLORIDE 0.9 % IV BOLUS (SEPSIS)
500.0000 mL | Freq: Once | INTRAVENOUS | Status: AC
  Administered 2016-10-11: 500 mL via INTRAVENOUS

## 2016-10-11 MED ORDER — MORPHINE SULFATE (PF) 4 MG/ML IV SOLN
4.0000 mg | Freq: Once | INTRAVENOUS | Status: AC
  Administered 2016-10-11: 4 mg via INTRAVENOUS
  Filled 2016-10-11: qty 1

## 2016-10-11 MED ORDER — PIPERACILLIN-TAZOBACTAM 3.375 G IVPB 30 MIN
3.3750 g | Freq: Once | INTRAVENOUS | Status: AC
  Administered 2016-10-11: 3.375 g via INTRAVENOUS
  Filled 2016-10-11: qty 50

## 2016-10-11 MED ORDER — ONDANSETRON HCL 4 MG/2ML IJ SOLN
4.0000 mg | Freq: Once | INTRAMUSCULAR | Status: AC
  Administered 2016-10-11: 4 mg via INTRAVENOUS
  Filled 2016-10-11: qty 2

## 2016-10-11 NOTE — Telephone Encounter (Signed)
Please call as was requested

## 2016-10-11 NOTE — ED Notes (Signed)
Consulting MD at bedside

## 2016-10-11 NOTE — ED Notes (Signed)
[facility] called for transport 

## 2016-10-11 NOTE — ED Notes (Signed)
Dr Olevia Bowens in room

## 2016-10-11 NOTE — Telephone Encounter (Signed)
Dr Brigitte Pulse, would you like to send medication for this patient? Please advise, thank you

## 2016-10-11 NOTE — ED Provider Notes (Signed)
  Physical Exam  BP (!) 124/57   Pulse 71   Temp 97.9 F (36.6 C) (Oral)   Resp 18   Ht 5\' 1"  (1.549 m)   Wt 61.7 kg (136 lb)   SpO2 98%   BMI 25.70 kg/m   Physical Exam  ED Course  Procedures  MDM 8:10 PM- Sign out from Jeannett Senior, PA-C  Per Previous provider HPI: Isabel Collins is a 76 y.o. female with history of anemia, COPD, chronic constipation, fibromyalgia, presents to emergency room complaining of right sided abdominal pain, right flank pain, nausea, vomiting. 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 Patient had a stent placed in the right ureter on 08/27/16 for persistent hematuria due to this mass as well as had a biopsy. Since then patient has had bleeding and pain in the side. She states she was treated with 2 rounds of antibiotics for possible infection. She states she has not been on any antibiotics recently. Patient was actually seen 3 days ago by family doctor. There has been multiple phone calls documented in Epic regarding patient's pain. Looks like there may be going to be a pain clinic referral made. Patient states she tried to get in to see her urologist, however there are no appointments, so instead they were able to call her an antibiotic for possible infection. At this time I'm unsure what antibiotic it is. We will get pharmacy reconciliation of medications done. Patient states that she is unable to manage this pain at home, and has persistent vomiting, unable to keep anything down. States she is not taking pain or nausea medications at thistime. Denies any fever or chills. Reports constipation. No blood in stool or emesis.   Pending Labs/UA  9:07 PM CBC with WBC 12.5 Lactic 2.01 UA with evidence of UTI. Urine Culture sent. CMP with creatinine WNL  Review urine Culture report (see below). Will give IV Zosyn in ED. Will admit to medicine due to Lactic acid, WBC as well as recurrent infection despite ABX.   Consult placed to  Urology Junious Silk) to follow along  Urine Culture Susceptibility on 09-17-16   Escherichia coli    MIC    AMPICILLIN >=32 RESIST... Resistant    AMPICILLIN/SULBACTAM >=32 RESIST... Resistant    CEFAZOLIN >=64 RESIST... Resistant    CEFTRIAXONE >=64 RESIST... Resistant    CIPROFLOXACIN >=4 RESISTANT  Resistant    Extended ESBL POSITIVE  Resistant    GENTAMICIN <=1 SENSITIVE "><=1 SENSITIVE  Sensitive    IMIPENEM <=0.25 SENSITIVE "><=0.25 SENS... Sensitive    NITROFURANTOIN <=16 SENSITIVE "><=16 SENSIT... Sensitive    PIP/TAZO 8 SENSITIVE  Sensitive    TRIMETH/SULFA >=320 RESIS... Resistant           Shary Decamp, PA-C 10/11/16 2155    Dorie Rank, MD 10/15/16 [phone]

## 2016-10-11 NOTE — ED Notes (Signed)
Pt taken to xray 

## 2016-10-11 NOTE — ED Provider Notes (Signed)
[facility] DEPT Provider Note   CSN: [id number] Arrival date & time: 10/11/16  1849     History   Chief Complaint Chief Complaint  Patient presents with  . Flank Pain  . Nausea    HPI LIVIE VANDERHOOF is a 76 y.o. female.  HPI REEVE MALLO is a 76 y.o. female with history of anemia, COPD, chronic constipation, fibromyalgia, presents to emergency room complaining of right sided abdominal pain, right flank pain, nausea, vomiting. 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 Patient had a stent placed in the right ureter on 08/27/16 for persistent hematuria due to this mass as well as had a biopsy. Since then patient has had bleeding and pain in the side. She states she was treated with 2 rounds of antibiotics for possible infection. She states she has not been on any antibiotics recently. Patient was actually seen 3 days ago by family doctor. There has been multiple phone calls documented in Epic regarding patient's pain. Looks like there may be going to be a pain clinic referral made. Patient states she tried to get in to see her urologist, however there are no appointments, so instead they were able to call her an antibiotic for possible infection. At this time I'm unsure what antibiotic it is. We will get pharmacy reconciliation of medications done. Patient states that she is unable to manage this pain at home, and has persistent vomiting, unable to keep anything down. States she is not taking pain or nausea medications at thistime. Denies any fever or chills. Reports constipation. No blood in stool or emesis.   Past Medical History:  Diagnosis Date  . Anemia   . Anxiety   . Chronic constipation   . COPD (chronic obstructive pulmonary disease) ([place])   . Degenerative arthritis of spine   . Diverticulosis of colon   . Fibromyalgia   . GERD (gastroesophageal reflux disease)   . Gross hematuria   . Hiatal hernia   . History of chronic bronchitis   . History  of colon polyps   . Hyperlipidemia   . Hypertension   . Hypothyroidism   . IBS (irritable bowel syndrome)   . OA (osteoarthritis)    hips  . Renal mass, right     Patient Active Problem List   Diagnosis Date Noted  . Right renal mass 10/08/2016  . Right flank pain, chronic 10/08/2016  . Chronic pain syndrome 10/08/2016  . History of recent fall 10/05/2016  . Physical deconditioning 10/01/2016  . Hypothyroidism 10/01/2016  . Chronic obstructive pulmonary disease ([place]) 10/01/2016  . Type 2 diabetes mellitus with complication, without long-term current use of insulin ([place]) 10/01/2016  . 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 Gastroesophageal reflux disease 10/01/2016  . Dyspnea on exertion 06/25/2016  . Fibromyalgia 05/23/2016  . 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 Hypertension 02/10/2016  . High cholesterol 02/10/2016  . Hiatal hernia 02/10/2016    Past Surgical History:  Procedure Laterality Date  . arm surgery Left   . BREAST REDUCTION SURGERY Bilateral   . CHOLECYSTECTOMY    . CYSTOSCOPY WITH BIOPSY Right 08/27/2016   Procedure: CYSTOSCOPY WITH BIOPSY;  Surgeon: Kathie Rhodes, MD;  Location: [facility];  Service: Urology;  Laterality: Right;  . CYSTOSCOPY WITH RETROGRADE PYELOGRAM, URETEROSCOPY AND STENT PLACEMENT Right 08/27/2016   Procedure: CYSTOSCOPY WITH RIGHT RETROGRADE PYELOGRAM, RIGHT URETEROSCOPY AND STENT PLACEMENT;  Surgeon: Karsten Ro,  Elta Guadeloupe, MD;  Location: [facility];  Service: Urology;  Laterality: Right;  . REPLACEMENT TOTAL KNEE Left   . TOTAL SHOULDER REPLACEMENT Right   . TUBAL LIGATION      OB History    No data available       Home Medications    Prior to Admission medications   Medication Sig Start Date End Date Taking? Authorizing Provider  ADVAIR DISKUS 250-50 MCG/DOSE AEPB Inhale 1 puff into the lungs 2 (two)  times daily as needed (shortness of breath).  06/06/16  Yes [provider]  albuterol (PROVENTIL HFA;VENTOLIN HFA) 108 (90 Base) MCG/ACT inhaler Inhale 1-2 puffs into the lungs every 6 (six) hours as needed for wheezing. 05/20/16  Yes Larene Pickett, PA-C  atorvastatin (LIPITOR) 40 MG tablet Take 1 tablet (40 mg total) by mouth every evening. Patient taking differently: Take 40 mg by mouth at bedtime.  09/08/16  Yes [name], MD  bisacodyl (DULCOLAX) 5 MG EC tablet Take 5 mg by mouth at bedtime as needed for moderate constipation.    Yes [provider]  cefpodoxime (VANTIN) 200 MG tablet Take 200 mg by mouth 2 (two) times daily. 10/11/16  Yes [provider]  Cholecalciferol (VITAMIN D3) 400 units CAPS Take 400 Units by mouth daily.   Yes [provider]  cycloSPORINE (RESTASIS) 0.05 % ophthalmic emulsion Place 1 drop into both eyes 2 (two) times daily. 09/19/16  Yes [name], MD  DULoxetine (CYMBALTA) 30 MG capsule Take two capsules po in the morning and one capsule po in the afternoon. 09/08/16  Yes [name], MD  fluticasone [facility]) 50 MCG/ACT nasal spray Place 2 sprays into both nostrils at bedtime as needed for allergies or rhinitis. 09/19/16  Yes [name], MD  HYDROcodone-acetaminophen (NORCO/VICODIN) 5-325 MG tablet Take 1 tablet by mouth every 6 (six) hours as needed for moderate pain. 10/08/16  Yes [place], Ines Bloomer, MD  levothyroxine (SYNTHROID, LEVOTHROID) 75 MCG tablet Take 1 tablet (75 mcg total) by mouth daily before breakfast. 09/08/16  Yes [name], MD  metoprolol succinate (TOPROL-XL) 100 MG 24 hr tablet Take 1 tablet (100 mg total) by mouth daily. Take with or immediately following a meal. 09/08/16  Yes [name], MD  NEXIUM 40 MG capsule Take 1 capsule (40 mg total) by mouth daily. 10/08/16  Yes Sagardia, Ines Bloomer, MD  polyvinyl alcohol (LIQUIFILM TEARS) 1.4 % ophthalmic solution Place 1 drop into both eyes every 4 (four) hours as needed  for dry eyes.   Yes [provider]  pregabalin (LYRICA) 150 MG capsule Take 1 capsule (150 mg total) by mouth 2 (two) times daily. 09/08/16   [name], MD  PRESCRIPTION MEDICATION Take 0.25 tablets by mouth at bedtime as needed (SLEEP). Sleep Medicine, patient is unsure of name or strength.    [provider]    Family History Family History  Problem Relation Age of Onset  . Diabetes Mother   . Heart disease Mother   . Diabetes Father   . Heart disease Father   . Diabetes Sister   . Diabetes Brother        x3  . Colon cancer Neg Hx   . Esophageal cancer Neg Hx   . Stomach cancer Neg Hx   . Pancreatic cancer Neg Hx     Social History Social History  Substance Use Topics  . Smoking status: Former Smoker    Packs/day:  0.50    Years: 46.00    Types: Cigarettes    Quit date: 03/05/2004  . Smokeless tobacco: Never Used  . Alcohol use No     Allergies   Patient has no known allergies.   Review of Systems Review of Systems  Constitutional: Negative for chills and fever.  Respiratory: Negative for cough, chest tightness and shortness of breath.   Cardiovascular: Negative for chest pain, palpitations and leg swelling.  Gastrointestinal: Positive for abdominal pain, nausea and vomiting. Negative for diarrhea.  Genitourinary: Positive for flank pain. Negative for dysuria, pelvic pain, vaginal bleeding, vaginal discharge and vaginal pain.  Musculoskeletal: Negative for arthralgias, myalgias, neck pain and neck stiffness.  Skin: Negative for rash.  Neurological: Negative for dizziness, weakness and headaches.  All other systems reviewed and are negative.    Physical Exam Updated Vital Signs BP (!) 144/69 (BP Location: Right Arm)   Pulse 73   Temp 97.9 F (36.6 C) (Oral)   Resp 18   Ht 5\' 1"  (1.549 m)   Wt 61.7 kg (136 lb)   SpO2 97%   BMI 25.70 kg/m   Physical Exam  Constitutional: She is oriented to person, place, and time. She appears  well-developed and well-nourished. No distress.  HENT:  Head: Normocephalic.  Eyes: Conjunctivae are normal.  Neck: Neck supple.  Cardiovascular: Normal rate, regular rhythm and normal heart sounds.   Pulmonary/Chest: Effort normal and breath sounds normal. No respiratory distress. She has no wheezes. She has no rales.  Abdominal: Soft. Bowel sounds are normal. She exhibits no distension. There is tenderness. There is no rebound.  Right CVA tenderness. Right upper quadrant tenderness  Musculoskeletal: She exhibits no edema.  Neurological: She is alert and oriented to person, place, and time.  Skin: Skin is warm and dry.  Psychiatric: She has a normal mood and affect. Her behavior is normal.  Nursing note and vitals reviewed.    ED Treatments / Results  Labs (all labs ordered are listed, but only abnormal results are displayed) Labs Reviewed  CBC WITH DIFFERENTIAL/PLATELET  COMPREHENSIVE METABOLIC PANEL  URINALYSIS, ROUTINE W REFLEX MICROSCOPIC  I-STAT CG4 LACTIC ACID, ED    EKG  EKG Interpretation None       Radiology Dg Abd 2 Views  Result Date: 10/11/2016 CLINICAL DATA:  Constipation EXAM: ABDOMEN - 2 VIEW COMPARISON:  09/17/2016 CT FINDINGS: A moderate amount of fecal retention is seen within large bowel consistent with constipation. A right-sided ureteral stent is noted, similar in appearance and orientation to prior CT scout view of the abdomen and pelvis. There is no evidence of free air. No radio-opaque calculi or other significant radiographic abnormality is seen. Cholecystectomy clips are seen in the right upper quadrant. There is thoracolumbar spondylosis with mild levoscoliosis. IMPRESSION: Findings consistent with constipation. Electronically Signed   By: Ashley Royalty M.D.   On: 10/11/2016 19:48    Procedures Procedures (including critical care time)  Medications Ordered in ED Medications  ondansetron (ZOFRAN) injection 4 mg (4 mg Intravenous Given 10/11/16 2002)    sodium chloride 0.9 % bolus 500 mL (500 mLs Intravenous New Bag/Given 10/11/16 2001)  morphine 4 MG/ML injection 4 mg (4 mg Intravenous Given 10/11/16 2001)     Initial Impression / Assessment and Plan / ED Course  I have reviewed the triage vital signs and the nursing notes.  Pertinent labs & imaging results that were available during my care of the patient were reviewed by me and considered in my  medical decision making (see chart for details).     Patient is here with persistent pain to the right abdomen, diagnoses of renal mass 3 months ago, since then has had a stent placed in the right ureter due to gross hematuria, and was seen in ED about one month ago at that time CT scan showed possibly occluded stent. This was discussed with urologist, they recommended given normal renal function to follow-up outpatient. Patient stated she was not able to keep that appointment because she could not get a ride. She was treated at bedtime for UTI. Will give her IV fluids at this time, antiemetics, pain medications, will check urine and labs. Pt also complaining of constipation. Will get abd films for evaluation   8:17 PM Pt signed out at shift change pending labs, UA, xrays. Disposition based on results and symptom control  Final Clinical Impressions(s) / ED Diagnoses   Final diagnoses:  Constipated    New Prescriptions New Prescriptions   No medications on file     Jeannett Senior, Hershal Coria 10/12/16 1608    Dorie Rank, MD 10/15/16 [phone]

## 2016-10-11 NOTE — Telephone Encounter (Signed)
Is she taking trazodone - it is listed in her chart that she is taking 1/4 tab of this at night so can increase this up to 1/2 tab or 1 tab at night.  I can send her in a prescription for her if needed.  SHe also needs to be taking something daily for constipation as increasing this medication combined with the pain medication will increase her already existent constipation. Make sure she is taking miralax 1-2x every day and add in senokot S in addition as needed.

## 2016-10-11 NOTE — Telephone Encounter (Signed)
??   Okay. THe [place]-[employer] forms I had completed had disappeared and so I completed them again last week on 8/3 and again put them in the "to fax" pile but at 102 this time rather than 104.  Hopefully they were received . Marland Kitchen Marland Kitchen

## 2016-10-11 NOTE — ED Notes (Signed)
Pt ambulated to the restroom.

## 2016-10-11 NOTE — Consult Note (Signed)
Consult: abd pain Requested by: Dr. 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 Karsten Ro 08/27/2016. A tumor was noted in the right upper pole collecting system and biopsy was consistent with high-grade urothelial carcinoma. She missed appointment with Dr. [name] to discuss right nephroureterectomy, but this appointment has been rescheduled for August 20 at 10:30 AM and it appears he is planning surgery August 30.  She is being admitted with a complaint of 2 weeks of no BM and progressive nausea, early satiety and emesis over past few days. She attributes this to not taking her "purple pill" or Nexium. She's had right flank pain for about a month. Also, intermittent dysuria x 1 mo. urine has been pink but no clots. Her lactic acid was initially 2.01 but quickly corrected. She is been afebrile with stable vitals. Urinalysis showed too numerous to count red cells, too numerous to count white cells and many bacteria. White count was 12.5, hemoglobin 12.8, hematocrit 40, BUN 5, creatinine 0.98. She underwent a KUB which revealed constipation and the stent in good position on the right.   She underwent a CT scan 09/17/2016 due to stent pain which showed likely a clot in the upper pole collecting system. The stent was thought to be obstructed because the ureter was "dilated". Urine culture grew 40,000 Escherichia coli with some resistance.  Past Medical History:  Diagnosis Date  . Anemia   . Anxiety   . Chronic constipation   . COPD (chronic obstructive pulmonary disease) ([place])   . Degenerative arthritis of spine   . Diverticulosis of colon   . Fibromyalgia   . GERD (gastroesophageal reflux disease)   . Gross hematuria   . Hiatal hernia   . History of chronic bronchitis   . History of colon polyps   . Hyperlipidemia   . Hypertension   . Hypothyroidism   . IBS (irritable bowel syndrome)   . OA (osteoarthritis)    hips  .  Renal mass, right    Past Surgical History:  Procedure Laterality Date  . arm surgery Left   . BREAST REDUCTION SURGERY Bilateral   . CHOLECYSTECTOMY    . CYSTOSCOPY WITH BIOPSY Right 08/27/2016   Procedure: CYSTOSCOPY WITH BIOPSY;  Surgeon: Kathie Rhodes, MD;  Location: [facility];  Service: Urology;  Laterality: Right;  . CYSTOSCOPY WITH RETROGRADE PYELOGRAM, URETEROSCOPY AND STENT PLACEMENT Right 08/27/2016   Procedure: CYSTOSCOPY WITH RIGHT RETROGRADE PYELOGRAM, RIGHT URETEROSCOPY AND STENT PLACEMENT;  Surgeon: Kathie Rhodes, MD;  Location: [facility];  Service: Urology;  Laterality: Right;  . REPLACEMENT TOTAL KNEE Left   . TOTAL SHOULDER REPLACEMENT Right   . TUBAL LIGATION      Home Medications:   (Not in a hospital admission) Allergies: No Known Allergies  Family History  Problem Relation Age of Onset  . Diabetes Mother   . Heart disease Mother   . Diabetes Father   . Heart disease Father   . Diabetes Sister   . Diabetes Brother        x3  . Colon cancer Neg Hx   . Esophageal cancer Neg Hx   . Stomach cancer Neg Hx   . Pancreatic cancer Neg Hx    Social History:  reports that she quit smoking about 12 years ago. Her smoking use included Cigarettes. She has a 23.00 pack-year smoking history. She has never used smokeless tobacco. She  reports that she does not drink alcohol or use drugs.  ROS: A complete review of systems was performed.  All systems are negative except for pertinent findings as noted. Review of Systems  Gastrointestinal: Positive for heartburn.  All other systems reviewed and are negative.    Physical Exam:  Vital signs in last 24 hours: Temp:  [97.8 F (36.6 C)-97.9 F (36.6 C)] 97.8 F (36.6 C) (08/09 2256) Pulse Rate:  [70-86] 70 (08/09 2315) Resp:  [10-18] 16 (08/09 2315) BP: (102-144)/(50-69) 111/53 (08/09 2315) SpO2:  [91 %-100 %] 100 % (08/09 2315) Weight:  [61.7 kg (136 lb)] 61.7 kg (136 lb) (08/09  1856) General:  Alert and oriented, No acute distress HEENT: Normocephalic, atraumatic Cardiovascular: Regular rate and rhythm Lungs: Regular rate and effort Abdomen: Soft, nontender, nondistended, no abdominal masses Back: No CVA tenderness Extremities: No edema Neurologic: Grossly intact  Laboratory Data:  Results for orders placed or performed during the hospital encounter of 10/11/16 (from the past 24 hour(s))  CBC with Differential     Status: Abnormal   Collection Time: 10/11/16  8:00 PM  Result Value Ref Range   WBC 12.5 (H) 4.0 - 10.5 K/uL   RBC 4.81 3.87 - 5.11 MIL/uL   Hemoglobin 12.8 12.0 - 15.0 g/dL   HCT 40.1 36.0 - 46.0 %   MCV 83.4 78.0 - 100.0 fL   MCH 26.6 26.0 - 34.0 pg   MCHC 31.9 30.0 - 36.0 g/dL   RDW 13.9 11.5 - 15.5 %   Platelets 238 150 - 400 K/uL   Neutrophils Relative % 72 %   Neutro Abs 9.0 (H) 1.7 - 7.7 K/uL   Lymphocytes Relative 17 %   Lymphs Abs 2.1 0.7 - 4.0 K/uL   Monocytes Relative 10 %   Monocytes Absolute 1.3 (H) 0.1 - 1.0 K/uL   Eosinophils Relative 1 %   Eosinophils Absolute 0.1 0.0 - 0.7 K/uL   Basophils Relative 0 %   Basophils Absolute 0.0 0.0 - 0.1 K/uL  Comprehensive metabolic panel     Status: Abnormal   Collection Time: 10/11/16  8:00 PM  Result Value Ref Range   Sodium 134 (L) 135 - 145 mmol/L   Potassium 3.6 3.5 - 5.1 mmol/L   Chloride 95 (L) 101 - 111 mmol/L   CO2 30 22 - 32 mmol/L   Glucose, Bld 126 (H) 65 - 99 mg/dL   BUN 5 (L) 6 - 20 mg/dL   Creatinine, Ser 0.98 0.44 - 1.00 mg/dL   Calcium 8.9 8.9 - 10.3 mg/dL   Total Protein 6.2 (L) 6.5 - 8.1 g/dL   Albumin 3.0 (L) 3.5 - 5.0 g/dL   AST 20 15 - 41 U/L   ALT 8 (L) 14 - 54 U/L   Alkaline Phosphatase 88 38 - 126 U/L   Total Bilirubin 1.0 0.3 - 1.2 mg/dL   GFR calc non Af Amer 54 (L) >60 mL/min   GFR calc Af Amer >60 >60 mL/min   Anion gap 9 5 - 15  Urinalysis, Routine w reflex microscopic     Status: Abnormal   Collection Time: 10/11/16  8:00 PM  Result Value Ref  Range   Color, Urine AMBER (A) YELLOW   APPearance CLOUDY (A) CLEAR   Specific Gravity, Urine 1.005 1.005 - 1.030   pH 7.0 5.0 - 8.0   Glucose, UA NEGATIVE NEGATIVE mg/dL   Hgb urine dipstick MODERATE (A) NEGATIVE   Bilirubin Urine NEGATIVE NEGATIVE   Ketones,  ur NEGATIVE NEGATIVE mg/dL   Protein, ur 100 (A) NEGATIVE mg/dL   Nitrite NEGATIVE NEGATIVE   Leukocytes, UA LARGE (A) NEGATIVE   RBC / HPF TOO NUMEROUS TO COUNT 0 - 5 RBC/hpf   WBC, UA TOO NUMEROUS TO COUNT 0 - 5 WBC/hpf   Bacteria, UA MANY (A) NONE SEEN   Squamous Epithelial / LPF 0-5 (A) NONE SEEN   WBC Clumps PRESENT    Mucous PRESENT    Non Squamous Epithelial 0-5 (A) NONE SEEN  I-Stat CG4 Lactic Acid, ED     Status: Abnormal   Collection Time: 10/11/16  8:22 PM  Result Value Ref Range   Lactic Acid, Venous 2.01 (HH) 0.5 - 1.9 mmol/L   Comment NOTIFIED PHYSICIAN   I-Stat CG4 Lactic Acid, ED     Status: None   Collection Time: 10/11/16 10:46 PM  Result Value Ref Range   Lactic Acid, Venous 1.27 0.5 - 1.9 mmol/L   No results found for this or any previous visit (from the past 240 hour(s)). Creatinine:  Recent Labs  10/11/16 2000  CREATININE 0.98   I reviewed CT images from May 2018 in July 2018. Also KUB image from today.  Impression/Assessment/Plan: 1) abdominal pain, emesis and weakness-agree with hydration. Treatment of constipation and GERD. Her kidney function is normal, hemoglobin and hematocrit stable, and stent in good position on KUB. I do not feel she needs further imaging at the current time.  2) bacteriuria - not uncommon with a stent. Urine culture pending. If positive could treat for urinary tract infection and keep on a nightly dose of nitrofurantoin until she follows up with Dr. [name].   3) right upper tract urothelial carcinoma-I discussed again the biopsy findings with the patient and the importance of follow-up with Dr. [name] (Aug 20, 10:30 AM). There was no metastatic disease on her CT  scans.  I'll notify Dr. Karsten Ro of admission.    Isabel Collins 10/11/2016, 11:48 PM

## 2016-10-11 NOTE — Telephone Encounter (Signed)
See note below

## 2016-10-11 NOTE — ED Triage Notes (Signed)
Pt endorses RLQ/Right flank pain with n/v, pt had stent placed in kidney 1 month ago and pt has had intermittent n/v since. Pt has hx of UTI's. VS 142/80, HR 64, RR 19, CBG 132

## 2016-10-11 NOTE — Telephone Encounter (Signed)
Called and spoke with [place]. She states she did not call us and she has no idea what this is about. The last time she called our office was on the 30th. She spoke with the nurse manager about paperwork she was "Trying to track down, but never heard back." States she had to find information throughout other means.

## 2016-10-11 NOTE — H&P (Addendum)
History and Physical    Isabel Collins XHB:[id number] DOB: 11/17/1940 DOA: 10/11/2016  PCP: [name], MD   Patient coming from: Home.  I have personally briefly reviewed patient's old medical records in [place]  Chief Complaint: Right flank pain, nausea and vomiting.  HPI: Isabel Collins is a 76 y.o. female with medical history significant of chronic anemia, anxiety, chronic constipation, COPD, osteoarthritis of spine, colon diverticulosis, fibromyalgia, GERD, history of gross hematuria, had a hernia,, colon polyps, hyperlipidemia, hypertension, hypothyroidism IBS, right renal mass who underwent right uterus copy and biopsy of right renal pelvic tumor with placement of right ureteral stent on 08/27/2016 and is coming with a history of progressively worse right flank pain with intermittent nausea and vomiting since then. Her biopsy showed high-grade urothelial carcinoma. She was supposed to follow-up, but missed appointment with Dr. [name] to discuss right nephroureterectomy, but this appointment has been rescheduled for August 20 at 10:30 AM and it appears he is planning surgery August 30.  She mentions that she has vomited 3-4 times since early afternoon today. She denies fever, but complains of chills and had an episode of night sweats recently this week. Denies headache, sore throat, worsening chronic dyspnea, chest pain, palpitations, dizziness, lower extremity edema, PND or orthopnea. She complains of right flank pain, multiple episodes of emesis, constipation, denies diarrhea, hematochezia or melena. She denies dysuria, frequency or gross hematuria. She denies polyuria, polydipsia or blurred vision.  ED Course: The patient received normal saline 1-1/2 L bolus, morphine 4 mg IVP 1 and Zofran 4 mg IVP 1 in the emergency department. She mentions that she feels better. Her urinalysis shows TNTC WBC and RBC with bacteriuria. Her WBC 12.5 with 72% neutrophils, hemoglobin 12.8  g/dL and platelets 238. CMP shows mild hyponatremia with a sodium level of 134 mmol/L. Nonfasting hyperglycemia 126 mg/dL and hypoalbuminemia at 3.0 g/dL. The rest of the values of the comprehensive metabolic panels are within normal limits.  Imaging: Two-view abdomen showed no free air, cholecystectomy clips, thoracolumbar spondylosis with mild levoscoliosis, right-sided ureteral stent and moderate stool retention on large bowel consistent with constipation. Please see images and full radiology report for further detail.  Pathology: Renal washing with high grade urothelial carcinoma. Please see full pathology report.  Review of Systems: As per HPI otherwise 10 point review of systems negative.    Past Medical History:  Diagnosis Date  . Anemia   . Anxiety   . Chronic constipation   . COPD (chronic obstructive pulmonary disease) ([place])   . Degenerative arthritis of spine   . Diverticulosis of colon   . Fibromyalgia   . GERD (gastroesophageal reflux disease)   . Gross hematuria   . Hiatal hernia   . History of chronic bronchitis   . History of colon polyps   . Hyperlipidemia   . Hypertension   . Hypothyroidism   . IBS (irritable bowel syndrome)   . OA (osteoarthritis)    hips  . Renal mass, right     Past Surgical History:  Procedure Laterality Date  . arm surgery Left   . BREAST REDUCTION SURGERY Bilateral   . CHOLECYSTECTOMY    . CYSTOSCOPY WITH BIOPSY Right 08/27/2016   Procedure: CYSTOSCOPY WITH BIOPSY;  Surgeon: Kathie Rhodes, MD;  Location[facility];  Service: Urology;  Laterality: Right;  . CYSTOSCOPY WITH RETROGRADE PYELOGRAM, URETEROSCOPY AND STENT PLACEMENT Right 08/27/2016   Procedure: CYSTOSCOPY WITH RIGHT RETROGRADE PYELOGRAM, RIGHT URETEROSCOPY AND STENT PLACEMENT;  Surgeon: Kathie Rhodes, MD;  Location: [facility];  Service: Urology;  Laterality: Right;  . REPLACEMENT TOTAL KNEE Left   . TOTAL SHOULDER REPLACEMENT Right   . TUBAL  LIGATION       reports that she quit smoking about 12 years ago. Her smoking use included Cigarettes. She has a 23.00 pack-year smoking history. She has never used smokeless tobacco. She reports that she does not drink alcohol or use drugs.  No Known Allergies  Family History  Problem Relation Age of Onset  . Diabetes Mother   . Heart disease Mother   . Diabetes Father   . Heart disease Father   . Diabetes Sister   . Diabetes Brother        x3  . Colon cancer Neg Hx   . Esophageal cancer Neg Hx   . Stomach cancer Neg Hx   . Pancreatic cancer Neg Hx     Prior to Admission medications   Medication Sig Start Date End Date Taking? Authorizing Provider  ADVAIR DISKUS 250-50 MCG/DOSE AEPB Inhale 1 puff into the lungs 2 (two) times daily as needed (shortness of breath).  06/06/16  Yes [provider]  albuterol (PROVENTIL HFA;VENTOLIN HFA) 108 (90 Base) MCG/ACT inhaler Inhale 1-2 puffs into the lungs every 6 (six) hours as needed for wheezing. 05/20/16  Yes Larene Pickett, PA-C  atorvastatin (LIPITOR) 40 MG tablet Take 1 tablet (40 mg total) by mouth every evening. Patient taking differently: Take 40 mg by mouth at bedtime.  09/08/16  Yes [name], MD  bisacodyl (DULCOLAX) 5 MG EC tablet Take 5 mg by mouth at bedtime as needed for moderate constipation.    Yes [provider]  cefpodoxime (VANTIN) 200 MG tablet Take 200 mg by mouth 2 (two) times daily. 10/11/16  Yes [provider]  Cholecalciferol (VITAMIN D3) 400 units CAPS Take 400 Units by mouth daily.   Yes [provider]  cycloSPORINE (RESTASIS) 0.05 % ophthalmic emulsion Place 1 drop into both eyes 2 (two) times daily. 09/19/16  Yes [name], MD  DULoxetine (CYMBALTA) 30 MG capsule Take two capsules po in the morning and one capsule po in the afternoon. 09/08/16  Yes [name], MD  fluticasone [facility]) 50 MCG/ACT nasal spray Place 2 sprays into both nostrils at bedtime as needed for allergies or  rhinitis. 09/19/16  Yes [name], MD  HYDROcodone-acetaminophen (NORCO/VICODIN) 5-325 MG tablet Take 1 tablet by mouth every 6 (six) hours as needed for moderate pain. 10/08/16  Yes Eaton, Ines Bloomer, MD  levothyroxine (SYNTHROID, LEVOTHROID) 75 MCG tablet Take 1 tablet (75 mcg total) by mouth daily before breakfast. 09/08/16  Yes [name], MD  metoprolol succinate (TOPROL-XL) 100 MG 24 hr tablet Take 1 tablet (100 mg total) by mouth daily. Take with or immediately following a meal. 09/08/16  Yes [name], MD  NEXIUM 40 MG capsule Take 1 capsule (40 mg total) by mouth daily. 10/08/16  Yes Sagardia, Ines Bloomer, MD  polyvinyl alcohol (LIQUIFILM TEARS) 1.4 % ophthalmic solution Place 1 drop into both eyes every 4 (four) hours as needed for dry eyes.   Yes [provider]  pregabalin (LYRICA) 150 MG capsule Take 1 capsule (150 mg total) by mouth 2 (two) times daily. 09/08/16  Yes [name], MD  PRESCRIPTION MEDICATION Take 0.25 tablets by mouth at bedtime as needed (SLEEP). Sleep Medicine, patient is unsure of name or strength.  Yes [provider]    Physical Exam: Vitals:   10/11/16 2145 10/11/16 2225 10/11/16 2245 10/11/16 2256  BP: (!) 105/59 (!) 102/51 (!) 118/55 (!) 118/55  Pulse: 77 75 71 72  Resp:  18 10 16   Temp:    97.8 F (36.6 C)  TempSrc:      SpO2: 93% 96% 99% 99%  Weight:      Height:        Constitutional: NAD, calm, comfortable Eyes: PERRL, lids and conjunctivae normal ENMT: Mucous membranes are Mildly dry. Posterior pharynx clear of any exudate or lesions. Neck: normal, supple, no masses, no thyromegaly Respiratory: clear to auscultation bilaterally, no wheezing, no crackles. Normal respiratory effort. No accessory muscle use.  Cardiovascular: Regular rate and rhythm, positive systolic murmur, no rubs / gallops. No extremity edema. 2+ pedal pulses. No carotid bruits.  Abdomen: Soft, mild RLQ tenderness and right CVA on percussion, no masses  palpated. No hepatosplenomegaly. Bowel sounds positive.  Musculoskeletal: no clubbing / cyanosis. Good ROM, no contractures. Normal muscle tone.  Skin: no rashes, lesions, ulcers on limited skin exam. Neurologic: CN 2-12 grossly intact. Sensation intact, DTR normal. Strength 5/5 in all 4.  Psychiatric: Normal judgment and insight. Alert and oriented x 4. Normal mood.    Labs on Admission: I have personally reviewed following labs and imaging studies  CBC:  Recent Labs Lab 10/11/16 2000  WBC 12.5*  NEUTROABS 9.0*  HGB 12.8  HCT 40.1  MCV 83.4  PLT 244   Basic Metabolic Panel:  Recent Labs Lab 10/11/16 2000  NA 134*  K 3.6  CL 95*  CO2 30  GLUCOSE 126*  BUN 5*  CREATININE 0.98  CALCIUM 8.9   GFR: Estimated Creatinine Clearance: 40.5 mL/min (by C-G formula based on SCr of 0.98 mg/dL). Liver Function Tests:  Recent Labs Lab 10/11/16 2000  AST 20  ALT 8*  ALKPHOS 88  BILITOT 1.0  PROT 6.2*  ALBUMIN 3.0*   No results for input(s): LIPASE, AMYLASE in the last 168 hours. No results for input(s): AMMONIA in the last 168 hours. Coagulation Profile: No results for input(s): INR, PROTIME in the last 168 hours. Cardiac Enzymes: No results for input(s): CKTOTAL, CKMB, CKMBINDEX, TROPONINI in the last 168 hours. BNP (last 3 results) No results for input(s): PROBNP in the last 8760 hours. HbA1C: No results for input(s): HGBA1C in the last 72 hours. CBG: No results for input(s): GLUCAP in the last 168 hours. Lipid Profile: No results for input(s): CHOL, HDL, LDLCALC, TRIG, CHOLHDL, LDLDIRECT in the last 72 hours. Thyroid Function Tests: No results for input(s): TSH, T4TOTAL, FREET4, T3FREE, THYROIDAB in the last 72 hours. Anemia Panel: No results for input(s): VITAMINB12, FOLATE, FERRITIN, TIBC, IRON, RETICCTPCT in the last 72 hours. Urine analysis:    Component Value Date/Time   COLORURINE AMBER (A) 10/11/2016 2000   APPEARANCEUR CLOUDY (A) 10/11/2016 2000    LABSPEC 1.005 10/11/2016 2000   PHURINE 7.0 10/11/2016 2000   GLUCOSEU NEGATIVE 10/11/2016 2000   HGBUR MODERATE (A) 10/11/2016 2000   BILIRUBINUR NEGATIVE 10/11/2016 2000   BILIRUBINUR small (A) 07/26/2016 1432   KETONESUR NEGATIVE 10/11/2016 2000   PROTEINUR 100 (A) 10/11/2016 2000   UROBILINOGEN 0.2 07/26/2016 1432   NITRITE NEGATIVE 10/11/2016 2000   LEUKOCYTESUR LARGE (A) 10/11/2016 2000    Radiological Exams on Admission: Dg Abd 2 Views  Result Date: 10/11/2016 CLINICAL DATA:  Constipation EXAM: ABDOMEN - 2 VIEW COMPARISON:  09/17/2016 CT FINDINGS: A moderate amount  of fecal retention is seen within large bowel consistent with constipation. A right-sided ureteral stent is noted, similar in appearance and orientation to prior CT scout view of the abdomen and pelvis. There is no evidence of free air. No radio-opaque calculi or other significant radiographic abnormality is seen. Cholecystectomy clips are seen in the right upper quadrant. There is thoracolumbar spondylosis with mild levoscoliosis. IMPRESSION: Findings consistent with constipation. Electronically Signed   By: Ashley Royalty M.D.   On: 10/11/2016 19:48    EKG: Independently reviewed.   Assessment/Plan Principal Problem:   Acute pyelonephritis   Retained ureteral stent   Urothelial carcinoma. Admit to [facility] [employer]. Continue IV fluids. Continue IV antibiotics to cover ESBL Escherichia coli. Contact precautions on this and all future admissions. Urology evaluation in a.m.  Active Problems:   Hypertension Continue metoprolol succinate 100 mg by mouth daily. Monitor blood pressure.    High cholesterol Continue atorvastatin 40 mg by mouth at bedtime. Monitor LFTs as needed. Fasting lipid panel follow-up as an outpatient.    Hypothyroidism Continue levothyroxine 75 g by mouth daily.    Chronic obstructive pulmonary disease (HCC) Asymptomatic at this time. Supplemental oxygen and bronchodilators  as needed.    Type 2 diabetes mellitus with complication, without long-term current use of insulin (HCC) CBG monitoring every 6 hours while nothing by mouth. Switch to before meals and at bedtime once cleared for oral intake. Consider regular insulin sliding scale coverage if blood glucose becomes uncontrollable.      Gastroesophageal reflux disease Protonix 40 mg by mouth daily.    Chronic pain syndrome Continue Cymbalta 60 mg po in the mornings. Continue Cymbalta 30 mg po in the afternoon. Continue analgesics as needed.    Chronic constipation Fleet enema as needed. Continue Dulcolax 5 mg by mouth twice a day.   DVT prophylaxis: Heparin SQ. Code Status: Full code. Family Communication:  Disposition Plan: Admit for IV antibiotic therapy and neurology evaluation. Consults called: Urology was consulted by the ED Festus Aloe, M.D.). Admission status: Inpatient/telemetry.   Reubin Milan MD [facility] Pager [phone]  If 7PM-7AM, please contact night-coverage [email or web address] Password [email or web address]  10/11/2016, 11:16 PM

## 2016-10-11 NOTE — ED Notes (Signed)
Pt given ice chips

## 2016-10-11 NOTE — ED Notes (Signed)
Pt ambulated to rest room to given urine specimen.

## 2016-10-12 DIAGNOSIS — J449 Chronic obstructive pulmonary disease, unspecified: Secondary | ICD-10-CM

## 2016-10-12 DIAGNOSIS — K5909 Other constipation: Secondary | ICD-10-CM

## 2016-10-12 DIAGNOSIS — G894 Chronic pain syndrome: Secondary | ICD-10-CM

## 2016-10-12 MED ORDER — FLUTICASONE PROPIONATE 50 MCG/ACT NA SUSP: 2.0000 | Freq: Every evening | NASAL | Status: DC | PRN

## 2016-10-12 MED ORDER — BOOST / RESOURCE BREEZE PO LIQD
1.0000 | Freq: Three times a day (TID) | ORAL | Status: DC
  Administered 2016-10-12 – 2016-10-16 (×6): 1 via ORAL

## 2016-10-12 MED ORDER — FLEET ENEMA 7-19 GM/118ML RE ENEM
1.0000 | ENEMA | Freq: Once | RECTAL | Status: AC
  Administered 2016-10-12: 1 via RECTAL
  Filled 2016-10-12: qty 1

## 2016-10-12 MED ORDER — PREGABALIN 50 MG PO CAPS
150.0000 mg | ORAL_CAPSULE | Freq: Two times a day (BID) | ORAL | Status: DC
  Administered 2016-10-12 – 2016-10-16 (×9): 150 mg via ORAL
  Filled 2016-10-12: qty 6
  Filled 2016-10-12 (×8): qty 3

## 2016-10-12 MED ORDER — BISACODYL 5 MG PO TBEC
5.0000 mg | DELAYED_RELEASE_TABLET | Freq: Two times a day (BID) | ORAL | Status: DC
  Administered 2016-10-12 (×3): 5 mg via ORAL
  Filled 2016-10-12 (×2): qty 1

## 2016-10-12 MED ORDER — MORPHINE SULFATE (PF) 4 MG/ML IV SOLN
2.0000 mg | INTRAVENOUS | Status: DC | PRN
  Administered 2016-10-12 – 2016-10-14 (×7): 2 mg via INTRAVENOUS
  Filled 2016-10-12 (×6): qty 1

## 2016-10-12 MED ORDER — IPRATROPIUM-ALBUTEROL 0.5-2.5 (3) MG/3ML IN SOLN: 3.0000 mL | Freq: Four times a day (QID) | RESPIRATORY_TRACT | Status: DC | PRN

## 2016-10-12 MED ORDER — ONDANSETRON HCL 4 MG/2ML IJ SOLN: 4.0000 mg | Freq: Four times a day (QID) | INTRAMUSCULAR | Status: DC | PRN

## 2016-10-12 MED ORDER — DULOXETINE HCL 30 MG PO CPEP
30.0000 mg | ORAL_CAPSULE | Freq: Every day | ORAL | Status: DC
  Administered 2016-10-13 – 2016-10-16 (×4): 30 mg via ORAL
  Filled 2016-10-12 (×4): qty 1

## 2016-10-12 MED ORDER — ONDANSETRON HCL 4 MG PO TABS
4.0000 mg | ORAL_TABLET | Freq: Four times a day (QID) | ORAL | Status: DC | PRN
  Administered 2016-10-13 – 2016-10-14 (×2): 4 mg via ORAL
  Filled 2016-10-12 (×2): qty 1

## 2016-10-12 MED ORDER — PANTOPRAZOLE SODIUM 40 MG PO TBEC
40.0000 mg | DELAYED_RELEASE_TABLET | Freq: Every day | ORAL | Status: DC
  Administered 2016-10-12 – 2016-10-16 (×5): 40 mg via ORAL
  Filled 2016-10-12 (×5): qty 1

## 2016-10-12 MED ORDER — CHLORHEXIDINE GLUCONATE CLOTH 2 % EX PADS
6.0000 | MEDICATED_PAD | Freq: Every day | CUTANEOUS | Status: DC
Start: 2016-10-12 — End: 2016-10-12

## 2016-10-12 MED ORDER — POLYETHYLENE GLYCOL 3350 17 G PO PACK
17.0000 g | PACK | Freq: Every day | ORAL | Status: DC
  Administered 2016-10-12 – 2016-10-13 (×2): 17 g via ORAL
  Filled 2016-10-12 (×2): qty 1

## 2016-10-12 MED ORDER — SODIUM CHLORIDE 0.9 % IV SOLN
1.0000 g | Freq: Two times a day (BID) | INTRAVENOUS | Status: DC
  Administered 2016-10-12 – 2016-10-16 (×9): 1 g via INTRAVENOUS
  Filled 2016-10-12 (×9): qty 1

## 2016-10-12 MED ORDER — FLEET ENEMA 7-19 GM/118ML RE ENEM: 1.0000 | ENEMA | Freq: Every day | RECTAL | Status: DC | PRN

## 2016-10-12 MED ORDER — PIPERACILLIN-TAZOBACTAM 3.375 G IVPB
3.3750 g | Freq: Three times a day (TID) | INTRAVENOUS | Status: DC
  Administered 2016-10-12: 3.375 g via INTRAVENOUS

## 2016-10-12 MED ORDER — METOPROLOL SUCCINATE ER 100 MG PO TB24
100.0000 mg | ORAL_TABLET | Freq: Every day | ORAL | Status: DC
Start: 2016-10-12 — End: 2016-10-16
  Administered 2016-10-12 – 2016-10-16 (×5): 100 mg via ORAL
  Filled 2016-10-12 (×5): qty 1

## 2016-10-12 MED ORDER — LEVOTHYROXINE SODIUM 75 MCG PO TABS
75.0000 ug | ORAL_TABLET | Freq: Every day | ORAL | Status: DC
  Administered 2016-10-12 – 2016-10-16 (×5): 75 ug via ORAL
  Filled 2016-10-12 (×2): qty 1
  Filled 2016-10-12: qty 3
  Filled 2016-10-12: qty 1
  Filled 2016-10-12 (×3): qty 3
  Filled 2016-10-12 (×2): qty 1
  Filled 2016-10-12: qty 3

## 2016-10-12 MED ORDER — ATORVASTATIN CALCIUM 40 MG PO TABS
40.0000 mg | ORAL_TABLET | Freq: Every day | ORAL | Status: DC
  Administered 2016-10-12 – 2016-10-15 (×4): 40 mg via ORAL
  Filled 2016-10-12 (×4): qty 1

## 2016-10-12 MED ORDER — DULOXETINE HCL 30 MG PO CPEP
30.0000 mg | ORAL_CAPSULE | Freq: Every day | ORAL | Status: DC
  Administered 2016-10-12 – 2016-10-16 (×5): 30 mg via ORAL
  Filled 2016-10-12 (×5): qty 1

## 2016-10-12 MED ORDER — MOMETASONE FURO-FORMOTEROL FUM 200-5 MCG/ACT IN AERO
2.0000 | INHALATION_SPRAY | Freq: Two times a day (BID) | RESPIRATORY_TRACT | Status: DC
Start: 2016-10-12 — End: 2016-10-16
  Administered 2016-10-13 – 2016-10-16 (×7): 2 via RESPIRATORY_TRACT
  Filled 2016-10-12: qty 8.8

## 2016-10-12 MED ORDER — MUPIROCIN 2 % EX OINT: 1.0000 "application " | TOPICAL_OINTMENT | Freq: Two times a day (BID) | CUTANEOUS | Status: DC

## 2016-10-12 MED ORDER — DULOXETINE HCL 60 MG PO CPEP
60.0000 mg | ORAL_CAPSULE | Freq: Every day | ORAL | Status: DC
  Administered 2016-10-12: 60 mg via ORAL
  Filled 2016-10-12: qty 1

## 2016-10-12 MED ORDER — POTASSIUM CHLORIDE IN NACL 20-0.9 MEQ/L-% IV SOLN
INTRAVENOUS | Status: AC
  Administered 2016-10-12: 02:00:00 via INTRAVENOUS
  Filled 2016-10-12 (×2): qty 1000

## 2016-10-12 MED ORDER — HEPARIN SODIUM (PORCINE) 5000 UNIT/ML IJ SOLN
5000.0000 [IU] | Freq: Three times a day (TID) | INTRAMUSCULAR | Status: DC
  Administered 2016-10-12 – 2016-10-16 (×12): 5000 [IU] via SUBCUTANEOUS
  Filled 2016-10-12 (×12): qty 1

## 2016-10-12 MED ORDER — CYCLOSPORINE 0.05 % OP EMUL
1.0000 [drp] | Freq: Two times a day (BID) | OPHTHALMIC | Status: DC
  Administered 2016-10-12 – 2016-10-16 (×9): 1 [drp] via OPHTHALMIC
  Filled 2016-10-12 (×10): qty 1

## 2016-10-12 NOTE — ED Notes (Signed)
Report given to JASA from [facility]

## 2016-10-12 NOTE — Discharge Instructions (Signed)

## 2016-10-12 NOTE — Progress Notes (Signed)
Initial Nutrition Assessment  DOCUMENTATION CODES:   Not applicable  INTERVENTION:   Boost Breeze po TID, each supplement provides 250 kcal and 9 grams of protein  NUTRITION DIAGNOSIS:   Unintentional weight loss related to poor appetite as evidenced by 14 % weight loss in 5 months.  GOAL:   Patient will meet greater than or equal to 90% of their needs  MONITOR:   PO intake, Supplement acceptance, Diet advancement, Labs, Weight trends  REASON FOR ASSESSMENT:   Malnutrition Screening Tool   ASSESSMENT:   Pt with PMH of COPD, colon diverticulosis, hernia, HLD, HTN, hypothyroidism, IBS, chronic constipation, and right renal mass. Had right ureteral stent placement 6/25. Presents this admission with acute pyelonephritis, retained ureteral stent, and urothelial carcinoma.   Pt reports loss in appetite for the past three weeks prior to admission. States she ate one meal per day if she could tolerate it. Pt began falling asleep, when probed further about her PO intake. She is currently NPO for procedure, but is eager to eat this admission.   Reports a UBW of 160 lb, the last time being at that wt unknown. Records indicate pt weighed 158 lb in April showing a 14 % wt loss to current wt of 144 lb. This is significant.   Nutrition-Focused physical exam completed. Findings are no fat depletion, mild muscle depletion, and no edema. With given wt loss, pt concerned about getting supplements at home. Discussed different brands and options she could choose, and provided additional information in her discharge packet.   Medications reviewed and include: dulcolax, NS with KCl @ 88 ml/hr, IV abx Labs reviewed: Na 134 (L) Cl 95 (L) Albumin 3.0 (L) ALT 8 (L) CBG 126  Diet Order:  Diet full liquid Room service appropriate? Yes; Fluid consistency: Thin  Skin:  Reviewed, no issues  Last BM:  09/28/16  Height:   Ht Readings from Last 1 Encounters:  10/12/16 5\' 2"  (1.575 m)    Weight:   Wt  Readings from Last 1 Encounters:  10/12/16 144 lb 6.4 oz (65.5 kg)    Ideal Body Weight:  50 kg  BMI:  Body mass index is 26.41 kg/m.  Estimated Nutritional Needs:   Kcal:  1200-1400 (MSJ)  Protein:  70-80 grams (1.4-1.6 g/kg IBW)  Fluid:  >1.2 L/day  EDUCATION NEEDS:   No education needs identified at this time  [place], LDN Clinical Nutrition Pager # - [phone]

## 2016-10-12 NOTE — Progress Notes (Signed)
Pharmacy Antibiotic Note  Isabel Collins is a 76 y.o. female admitted on 10/11/2016 with abdominal/flank pain as well as nausea and vomiting. She has substantial renal history with recent stent placement in June 2018. Patient started on Zosyn in setting of possible UTI, but due to history of previously grown ESBL ecoli on culture, MD wishes to switch to Meropenem for pyelonephritis.   Plan: Meropenem 1g IV q12h. F/u urine culture results.   Height: 5\' 2"  (157.5 cm) Weight: 144 lb 6.4 oz (65.5 kg) IBW/kg (Calculated) : 50.1  Temp (24hrs), Avg:98 F (36.7 C), Min:97.6 F (36.4 C), Max:98.4 F (36.9 C)   Recent Labs Lab 10/11/16 2000 10/11/16 2022 10/11/16 2246  WBC 12.5*  --   --   CREATININE 0.98  --   --   LATICACIDVEN  --  2.01* 1.27    Estimated Creatinine Clearance: 42.7 mL/min (by C-G formula based on SCr of 0.98 mg/dL).    No Known Allergies  Antimicrobials this admission: 8/10 Zosyn >> 8/10 8/10 Meropenem >>  Dose adjustments this admission: n/a  Microbiology results: 8/9 Blood cx: sent  Previous micro results 7/16 Urine: ESBL E.coli (40,000 colonies)   Hershal Coria, PharmD, BCPS Pager: [phone] 10/12/2016 11:50 AM

## 2016-10-12 NOTE — Progress Notes (Signed)
PROGRESS NOTE    Isabel Collins  IZT:[id number] DOB: Feb 29, 1941 DOA: 10/11/2016 PCP: [name], MD  Brief Narrative: Isabel Collins is a 76 y.o. 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 Karsten Ro 08/27/2016. She was supposed to follow-up, but missed appointment with Dr. [name] to discuss right nephroureterectomy, rescheduled, now with admitted with Nausea/vomiting, flank pain, constipation, UA abnormal, pyelonephritis suspected   Assessment & Plan:   UTI/ pyelonephritis with recent h/o ureteroscopy/biopsy and stent  -and high grade Urothelial CA - Change ABx to Carbapenem to cover ESBL UTI which she grew last month - FU repeat urine Cx - IVF today -appreciate Urology consult  Hypertension -Continue metoprolol   Chronic obstructive pulmonary disease ([place]) -stable, oxygen and bronchodilators as needed.    Type 2 diabetes mellitus with complication, without long-term current use of insulin (HCC) -not on meds, check Hba1c, SSI for now      Gastroesophageal reflux disease Protonix 40 mg by mouth daily.    Chronic pain syndrome -he is on Cymbalta 60 mg po in the mornings and 30 mg po in the afternoon. -will cut down to 30mg  BID    Dyslipidemia -Continue atorvastatin 40 mg by mouth at bedtime.    Hypothyroidism Continue levothyroxine 75 g by mouth daily.      Chronic constipation Fleet enema today Continue Dulcolax 5 mg by mouth twice a day.  DVT prophylaxis: Heparin SQ. Code Status: Full code. Family Communication:  Disposition Plan: home in 1-2days  Consultants:   Urology   Procedures:   Antimicrobials:   Zosyn 8/9-8/10  Meropenem  8/10   Subjective: Feels ok, some nausea this am, no vomiting, no BM  Objective: Vitals:   10/12/16 0000 10/12/16 0031 10/12/16 0102 10/12/16 0648  BP: (!) 109/58 (!) 113/57 (!) 125/52 139/61  Pulse: 70 74 61 72  Resp: 16 16 16 18   Temp:  97.6 F (36.4 C) 98.4 F (36.9 C) 98.2 F (36.8 C)  TempSrc:   Oral Oral  SpO2: 98% 99% 95% 98%  Weight:   65.5 kg (144 lb 6.4 oz)   Height:   5\' 2"  (1.575 m)     Intake/Output Summary (Last 24 hours) at 10/12/16 1139 Last data filed at 10/12/16 0600  Gross per 24 hour  Intake           922.53 ml  Output                0 ml  Net           922.53 ml   Filed Weights   10/11/16 1856 10/12/16 0102  Weight: 61.7 kg (136 lb) 65.5 kg (144 lb 6.4 oz)    Examination:  General exam: Appears calm and comfortable, no distress Respiratory system: CTAB, no distress Cardiovascular system: S1 & S2 heard, RRR. No JVD, murmurs, rubs Gastrointestinal system: Abdomen is nondistended, soft and nontender. Normal bowel sounds heard. Central nervous system: Alert and oriented. No focal neurological deficits. Extremities: Symmetric 5 x 5 power, R ankle  Skin: No rashes, lesions or ulcers Psychiatry: Judgement and insight appear normal. Mood & affect appropriate.     Data Reviewed:   CBC:  Recent Labs Lab 10/11/16 2000  WBC 12.5*  NEUTROABS 9.0*  HGB 12.8  HCT 40.1  MCV 83.4  PLT 355   Basic Metabolic Panel:  Recent Labs Lab 10/11/16 2000  NA 134*  K 3.6  CL 95*  CO2 30  GLUCOSE 126*  BUN 5*  CREATININE 0.98  CALCIUM 8.9   GFR: Estimated Creatinine Clearance: 42.7 mL/min (by C-G formula based on SCr of 0.98 mg/dL). Liver Function Tests:  Recent Labs Lab 10/11/16 2000  AST 20  ALT 8*  ALKPHOS 88  BILITOT 1.0  PROT 6.2*  ALBUMIN 3.0*   No results for input(s): LIPASE, AMYLASE in the last 168 hours. No results for input(s): AMMONIA in the last 168 hours. Coagulation Profile: No results for input(s): INR, PROTIME in the  last 168 hours. Cardiac Enzymes: No results for input(s): CKTOTAL, CKMB, CKMBINDEX, TROPONINI in the last 168 hours. BNP (last 3 results) No results for input(s): PROBNP in the last 8760 hours. HbA1C: No results for input(s): HGBA1C in the last 72 hours. CBG: No results for input(s): GLUCAP in the last 168 hours. Lipid Profile: No results for input(s): CHOL, HDL, LDLCALC, TRIG, CHOLHDL, LDLDIRECT in the last 72 hours. Thyroid Function Tests: No results for input(s): TSH, T4TOTAL, FREET4, T3FREE, THYROIDAB in the last 72 hours. Anemia Panel: No results for input(s): VITAMINB12, FOLATE, FERRITIN, TIBC, IRON, RETICCTPCT in the last 72 hours. Urine analysis:    Component Value Date/Time   COLORURINE AMBER (A) 10/11/2016 2000   APPEARANCEUR CLOUDY (A) 10/11/2016 2000   LABSPEC 1.005 10/11/2016 2000   PHURINE 7.0 10/11/2016 2000   GLUCOSEU NEGATIVE 10/11/2016 2000   HGBUR MODERATE (A) 10/11/2016 2000   BILIRUBINUR NEGATIVE 10/11/2016 2000   BILIRUBINUR small (A) 07/26/2016 1432   KETONESUR NEGATIVE 10/11/2016 2000   PROTEINUR 100 (A) 10/11/2016 2000   UROBILINOGEN 0.2 07/26/2016 1432   NITRITE NEGATIVE 10/11/2016 2000   LEUKOCYTESUR LARGE (A) 10/11/2016 2000   Sepsis Labs: @LABRCNTIP (procalcitonin:4,lacticidven:4)  )No results found for this or any previous visit (from the past 240 hour(s)).       Radiology Studies: Dg Abd 2 Views  Result Date: 10/11/2016 CLINICAL DATA:  Constipation EXAM: ABDOMEN - 2 VIEW COMPARISON:  09/17/2016 CT FINDINGS: A moderate amount of fecal retention is seen within large bowel consistent with constipation. A right-sided ureteral stent is noted, similar in appearance and orientation to prior CT scout view of the abdomen and pelvis. There is no evidence of free air. No radio-opaque calculi or other significant radiographic abnormality is seen. Cholecystectomy clips are seen in the right upper quadrant. There is thoracolumbar spondylosis with mild  levoscoliosis. IMPRESSION: Findings consistent with constipation. Electronically Signed   By: Ashley Royalty M.D.   On: 10/11/2016 19:48        Scheduled Meds: . atorvastatin  40 mg Oral QHS  . bisacodyl  5 mg Oral BID  . cycloSPORINE  1 drop Both Eyes BID  . DULoxetine  30 mg Oral Q1400  . DULoxetine  60 mg Oral Q breakfast  . feeding supplement  1 Container Oral TID BM  . heparin  5,000 Units Subcutaneous Q8H  . levothyroxine  75 mcg Oral QAC breakfast  . metoprolol succinate  100 mg Oral Daily  . mometasone-formoterol  2 puff Inhalation BID  . pantoprazole  40 mg Oral Daily  . pregabalin  150 mg Oral BID   Continuous Infusions: . 0.9 % NaCl with KCl 20 mEq / L 88 mL/hr at 10/12/16 0146  . piperacillin-tazobactam (ZOSYN)  IV 3.375 g (10/12/16 0606)  LOS: 1 day    Time spent: 28min    Domenic Polite, MD [facility] Pager [phone]  If 7PM-7AM, please contact night-coverage [email or web address] Password [email or web address] 10/12/2016, 11:39 AM

## 2016-10-12 NOTE — Progress Notes (Signed)
Pharmacy Antibiotic Note  Isabel Collins is a 76 y.o. female admitted on 10/11/2016 with abdominal/flank pain as well as nausea and vomiting. She has substantial renal history with recent stent placement in June 2018. Pharmacy has been consulted for Zosyn dosing in setting of possible UTI (has previously grown ESBL ecoli on culture). Noted patient taking cefpodoxime PTA.   WBC elevated at 12.5 and currently afebrile. SCr 0.98 for estimated CrCl ~ 40 mL/min.   Plan: Zosyn 3.375g IV q8hr (4 hr infusion) Monitor renal function, clinical picture, and culture data F/u length of therapy   Height: 5\' 1"  (154.9 cm) Weight: 136 lb (61.7 kg) IBW/kg (Calculated) : 47.8  Temp (24hrs), Avg:97.8 F (36.6 C), Min:97.6 F (36.4 C), Max:97.9 F (36.6 C)   Recent Labs Lab 10/11/16 2000 10/11/16 2022 10/11/16 2246  WBC 12.5*  --   --   CREATININE 0.98  --   --   LATICACIDVEN  --  2.01* 1.27    Estimated Creatinine Clearance: 40.5 mL/min (by C-G formula based on SCr of 0.98 mg/dL).    No Known Allergies  Antimicrobials this admission: 8/10 Zosyn >>   Dose adjustments this admission: n/a  Microbiology results: 8/9 Blood cx: sent 7/16 Urine: ESBL e.coli (40,000 colonies)  Argie Ramming, PharmD Clinical Pharmacist 10/12/16 1:06 AM

## 2016-10-12 NOTE — Progress Notes (Signed)
Patient ID: Isabel Collins, female   DOB: 12/15/1940, 76 y.o.   MRN: [id number]    Assessment: 1.  Right upper pole transitional cell carcinoma - she has a surgery date scheduled for this on 7/30.  She is scheduled to see Dr. [name] preoperatively to go over this in detail.  2.  Abdominal pain - she has a stent in place.  It is in good position.  She has normal renal function.  She did have a previous culture that was positive for E. coli last month.  With a stent in place free reflux of urine into the kidney may have resulted in pyelonephritis. Fortunately she remains afebrile with only a very minimal elevation of her white blood cell count and no findings to suggest sepsis.  3.  Pyuria and bacteriuria - urine had red and white cells as well as many bacteria.  Urine culture is pending and she should be treated with appropriate antibiotic therapy with low-dose nitrofurantoin nightly prophylaxis until the date of her surgery.  Plan: 1.  Await culture results 2.  Continue empiric antibiotics with adjustment according to sensitivities.   Subjective: Patient reports feeling much better today.  Still having some intermittent pains on the right-hand side.  Objective: Vital signs in last 24 hours: Temp:  [97.6 F (36.4 C)-98.4 F (36.9 C)] 98.2 F (36.8 C) (08/10 0648) Pulse Rate:  [61-86] 70 (08/10 1206) Resp:  [10-18] 18 (08/10 0648) BP: (102-144)/(50-69) 119/56 (08/10 1206) SpO2:  [91 %-100 %] 98 % (08/10 0648) Weight:  [61.7 kg (136 lb)-65.5 kg (144 lb 6.4 oz)] 65.5 kg (144 lb 6.4 oz) (08/10 0102)A  Intake/Output from previous day: 08/09 0701 - 08/10 0700 In: 922.5 [I.V.:372.5; IV Piggyback:550] Out: 0  Intake/Output this shift: No intake/output data recorded.  Past Medical History:  Diagnosis Date  . Anemia   . Anxiety   . Chronic constipation   . COPD (chronic obstructive pulmonary disease) ([place])   . Degenerative arthritis of spine   . Diverticulosis of colon   .  Fibromyalgia   . GERD (gastroesophageal reflux disease)   . Gross hematuria   . Hiatal hernia   . History of chronic bronchitis   . History of colon polyps   . Hyperlipidemia   . Hypertension   . Hypothyroidism   . IBS (irritable bowel syndrome)   . OA (osteoarthritis)    hips  . Renal mass, right     Physical Exam:  Lungs - Normal respiratory effort, chest expands symmetrically.  Abdomen - Soft, non-tender & non-distended.  Lab Results:  Recent Labs  10/11/16 2000  WBC 12.5*  HGB 12.8  HCT 40.1   BMET  Recent Labs  10/11/16 2000  NA 134*  K 3.6  CL 95*  CO2 30  GLUCOSE 126*  BUN 5*  CREATININE 0.98  CALCIUM 8.9   No results for input(s): LABURIN in the last 72 hours. Results for orders placed or performed during the hospital encounter of 09/17/16  Urine culture     Status: Abnormal   Collection Time: 09/17/16  3:52 PM  Result Value Ref Range Status   Specimen Description URINE, RANDOM  Final   Special Requests NONE  Final   Culture (A)  Final    40,000 COLONIES/mL ESCHERICHIA COLI Confirmed Extended Spectrum Beta-Lactamase Producer (ESBL) Performed at [facility], [place] [address], [place], [place] [address]    Report Status 09/20/2016 FINAL  Final   Organism ID, Bacteria ESCHERICHIA COLI (A)  Final      Susceptibility   Escherichia coli - MIC*    AMPICILLIN >=32 RESISTANT Resistant     CEFAZOLIN >=64 RESISTANT Resistant     CEFTRIAXONE >=64 RESISTANT Resistant     CIPROFLOXACIN >=4 RESISTANT Resistant     GENTAMICIN <=1 SENSITIVE Sensitive     IMIPENEM <=0.25 SENSITIVE Sensitive     NITROFURANTOIN <=16 SENSITIVE Sensitive     TRIMETH/SULFA >=320 RESISTANT Resistant     AMPICILLIN/SULBACTAM >=32 RESISTANT Resistant     PIP/TAZO 8 SENSITIVE Sensitive     Extended ESBL POSITIVE Resistant     * 40,000 COLONIES/mL ESCHERICHIA COLI    Studies/Results: Dg Abd 2 Views  Result Date: 10/11/2016 CLINICAL DATA:  Constipation EXAM: ABDOMEN - 2 VIEW  COMPARISON:  09/17/2016 CT FINDINGS: A moderate amount of fecal retention is seen within large bowel consistent with constipation. A right-sided ureteral stent is noted, similar in appearance and orientation to prior CT scout view of the abdomen and pelvis. There is no evidence of free air. No radio-opaque calculi or other significant radiographic abnormality is seen. Cholecystectomy clips are seen in the right upper quadrant. There is thoracolumbar spondylosis with mild levoscoliosis. IMPRESSION: Findings consistent with constipation. Electronically Signed   By: Ashley Royalty M.D.   On: 10/11/2016 19:48      Yocelyn Brocious C 10/12/2016, 12:27 PM

## 2016-10-12 NOTE — ED Notes (Signed)
Dr. Olevia Bowens notified he needs to recert EMTALA for transport

## 2016-10-13 ENCOUNTER — Other Ambulatory Visit (HOSPITAL_COMMUNITY): Payer: Medicare Other

## 2016-10-13 DIAGNOSIS — E118 Type 2 diabetes mellitus with unspecified complications: Secondary | ICD-10-CM

## 2016-10-13 LAB — CBC WITH DIFFERENTIAL/PLATELET
BASOS PCT: 0 %
Basophils Absolute: 0 10*3/uL (ref 0.0–0.1)
Eosinophils Absolute: 0 10*3/uL (ref 0.0–0.7)
Eosinophils Relative: 0 %
HEMATOCRIT: 36.2 % (ref 36.0–46.0)
HEMOGLOBIN: 11.6 g/dL — AB (ref 12.0–15.0)
LYMPHS PCT: 21 %
Lymphs Abs: 2.5 10*3/uL (ref 0.7–4.0)
MCH: 27.3 pg (ref 26.0–34.0)
MCHC: 32 g/dL (ref 30.0–36.0)
MCV: 85.2 fL (ref 78.0–100.0)
MONOS PCT: 11 %
Monocytes Absolute: 1.4 10*3/uL — ABNORMAL HIGH (ref 0.1–1.0)
NEUTROS ABS: 8.4 10*3/uL — AB (ref 1.7–7.7)
NEUTROS PCT: 68 %
Platelets: 208 10*3/uL (ref 150–400)
RBC: 4.25 MIL/uL (ref 3.87–5.11)
RDW: 14.5 % (ref 11.5–15.5)
WBC: 12.4 10*3/uL — ABNORMAL HIGH (ref 4.0–10.5)

## 2016-10-13 LAB — BASIC METABOLIC PANEL
Anion gap: 8 (ref 5–15)
BUN: 7 mg/dL (ref 6–20)
CALCIUM: 8 mg/dL — AB (ref 8.9–10.3)
CO2: 26 mmol/L (ref 22–32)
CREATININE: 0.84 mg/dL (ref 0.44–1.00)
Chloride: 101 mmol/L (ref 101–111)
GFR calc Af Amer: >60 mL/min (ref >60)
GLUCOSE: 156 mg/dL — AB (ref 65–99)
Potassium: 3.3 mmol/L — ABNORMAL LOW (ref 3.5–5.1)
Sodium: 135 mmol/L (ref 135–145)

## 2016-10-13 MED ORDER — POLYETHYLENE GLYCOL 3350 17 G PO PACK
17.0000 g | PACK | Freq: Two times a day (BID) | ORAL | Status: DC
  Administered 2016-10-13 – 2016-10-16 (×6): 17 g via ORAL
  Filled 2016-10-13 (×6): qty 1

## 2016-10-13 MED ORDER — SENNOSIDES-DOCUSATE SODIUM 8.6-50 MG PO TABS
1.0000 | ORAL_TABLET | Freq: Two times a day (BID) | ORAL | Status: DC
  Administered 2016-10-13 – 2016-10-16 (×7): 1 via ORAL
  Filled 2016-10-13 (×7): qty 1

## 2016-10-13 NOTE — Progress Notes (Signed)
PROGRESS NOTE    Isabel Collins  KDX:[id number] DOB: 07/23/1940 DOA: 10/11/2016 PCP: [name], MD  Brief Narrative: Isabel Collins is a 76 y.o. 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 Karsten Ro 08/27/2016. She was supposed to follow-up, but missed appointment with Dr. [name] to discuss right nephroureterectomy, rescheduled, now with admitted with Nausea/vomiting, flank pain, constipation, UA abnormal, pyelonephritis suspected   Assessment & Plan:   UTI/ pyelonephritis with recent h/o ureteroscopy/biopsy and stent  -Biopsy with high grade Urothelial CA - Continue IV meropenem to cover ESBL UTI which she grew last month - FU repeat urine Cx-still pending - stop IVF,  - appreciate Urology consult  Hypertension -Continue metoprolol   Chronic obstructive pulmonary disease ([place]) -stable, oxygen and bronchodilators as needed.    Type 2 diabetes mellitus with complication, without long-term current use of insulin (H[facility]) -not on meds, check Hba1c, SSI for now      Gastroesophageal reflux disease Protonix 40 mg by mouth daily.    Chronic pain syndrome -he is on Cymbalta 60 mg po in the mornings and 30 mg po in the afternoon. -will cut down to 30mg  BID    Dyslipidemia -Continue atorvastatin 40 mg by mouth at bedtime.    Hypothyroidism Continue levothyroxine 75 g by mouth daily.      Chronic constipation Fleet enema today Continue Dulcolax 5 mg by mouth twice a day.  DVT prophylaxis: Heparin SQ. Code Status: Full code. Family Communication:  Disposition Plan: home in 1-2days  Consultants:   Urology   Procedures:   Antimicrobials:   Zosyn  8/9-8/10  Meropenem 8/10   Subjective: -Complains of pain at the site of her right kidney, no further nausea or vomiting  Objective: Vitals:   10/12/16 1328 10/12/16 2059 10/13/16 0530 10/13/16 0816  BP: (!) 105/93 (!) 126/44 (!) 117/54   Pulse: 92 81 80   Resp: 18 12 12    Temp: 98.2 F (36.8 C) 99.4 F (37.4 C) 98.1 F (36.7 C)   TempSrc: Oral Oral Oral   SpO2: 90% 93% (!) 88% 90%  Weight:      Height:        Intake/Output Summary (Last 24 hours) at 10/13/16 1132 Last data filed at 10/12/16 1727  Gross per 24 hour  Intake              100 ml  Output                0 ml  Net              100 ml   Filed Weights   10/11/16 1856 10/12/16 0102  Weight: 61.7 kg (136 lb) 65.5 kg (144 lb 6.4 oz)    Examination:  Gen: Frail, obese, chronically ill female  HEENT: PERRLA, Neck supple, no JVD Lungs: Good air movement bilaterally, CTAB CVS: RRR,No Gallops,Rubs or new Murmurs Abd: Soft, obese, mildly distended, nontender, bowel sounds present Extremities: No Cyanosis, Clubbing or edema Skin: no new rashes  Data Reviewed:   CBC:  Recent Labs Lab 10/11/16 2000  WBC 12.5*  NEUTROABS 9.0*  HGB 12.8  HCT 40.1  MCV 83.4  PLT 976   Basic Metabolic Panel:  Recent Labs Lab 10/11/16 2000  NA 134*  K 3.6  CL 95*  CO2 30  GLUCOSE 126*  BUN 5*  CREATININE 0.98  CALCIUM 8.9   GFR: Estimated Creatinine Clearance: 42.7 mL/min (by C-G formula based on SCr of 0.98 mg/dL). Liver Function Tests:  Recent Labs Lab 10/11/16 2000  AST 20  ALT 8*  ALKPHOS 88  BILITOT 1.0  PROT 6.2*  ALBUMIN 3.0*   No results for input(s): LIPASE, AMYLASE in the last 168 hours. No results for input(s): AMMONIA in the last 168 hours. Coagulation Profile: No results for input(s): INR, PROTIME in the last 168 hours. Cardiac Enzymes: No results for input(s): CKTOTAL, CKMB, CKMBINDEX, TROPONINI in the last 168 hours. BNP (last 3 results) No results for input(s): PROBNP in the last  8760 hours. HbA1C: No results for input(s): HGBA1C in the last 72 hours. CBG: No results for input(s): GLUCAP in the last 168 hours. Lipid Profile: No results for input(s): CHOL, HDL, LDLCALC, TRIG, CHOLHDL, LDLDIRECT in the last 72 hours. Thyroid Function Tests: No results for input(s): TSH, T4TOTAL, FREET4, T3FREE, THYROIDAB in the last 72 hours. Anemia Panel: No results for input(s): VITAMINB12, FOLATE, FERRITIN, TIBC, IRON, RETICCTPCT in the last 72 hours. Urine analysis:    Component Value Date/Time   COLORURINE AMBER (A) 10/11/2016 2000   APPEARANCEUR CLOUDY (A) 10/11/2016 2000   LABSPEC 1.005 10/11/2016 2000   PHURINE 7.0 10/11/2016 2000   GLUCOSEU NEGATIVE 10/11/2016 2000   HGBUR MODERATE (A) 10/11/2016 2000   BILIRUBINUR NEGATIVE 10/11/2016 2000   BILIRUBINUR small (A) 07/26/2016 1432   KETONESUR NEGATIVE 10/11/2016 2000   PROTEINUR 100 (A) 10/11/2016 2000   UROBILINOGEN 0.2 07/26/2016 1432   NITRITE NEGATIVE 10/11/2016 2000   LEUKOCYTESUR LARGE (A) 10/11/2016 2000   Sepsis Labs: @LABRCNTIP (procalcitonin:4,lacticidven:4)  ) Recent Results (from the past 240 hour(s))  Blood culture (routine x 2)     Status: None (Preliminary result)   Collection Time: 10/11/16  9:43 PM  Result Value Ref Range Status   Specimen Description BLOOD RIGHT ANTECUBITAL  Final   Special Requests   Final    BOTTLES DRAWN AEROBIC AND ANAEROBIC Blood Culture adequate volume   Culture NO GROWTH < 24 HOURS  Final   Report Status PENDING  Incomplete  Blood culture (routine x 2)     Status: None (Preliminary result)   Collection Time: 10/11/16  9:45 PM  Result Value Ref Range Status   Specimen Description BLOOD RIGHT HAND  Final   Special Requests   Final    IN PEDIATRIC BOTTLE Blood Culture results may not be optimal due to an excessive volume of blood received in culture bottles   Culture NO GROWTH < 24 HOURS  Final   Report Status PENDING  Incomplete         Radiology Studies: Dg  Abd 2 Views  Result Date: 10/11/2016 CLINICAL DATA:  Constipation EXAM: ABDOMEN - 2 VIEW COMPARISON:  09/17/2016 CT FINDINGS: A moderate amount of fecal retention is seen within large bowel consistent with constipation. A right-sided ureteral stent is noted, similar in appearance and orientation to prior CT scout view of the abdomen and pelvis. There is no evidence of free air. No radio-opaque calculi or other significant radiographic abnormality is seen. Cholecystectomy clips are seen in the right upper quadrant. There is thoracolumbar spondylosis with mild levoscoliosis. IMPRESSION: Findings consistent with constipation. Electronically Signed   By: Ashley Royalty M.D.   On: 10/11/2016 19:48        Scheduled Meds: .  atorvastatin  40 mg Oral QHS  . cycloSPORINE  1 drop Both Eyes BID  . DULoxetine  30 mg Oral Q1400  . DULoxetine  30 mg Oral Q breakfast  . feeding supplement  1 Container Oral TID BM  . heparin  5,000 Units Subcutaneous Q8H  . levothyroxine  75 mcg Oral QAC breakfast  . metoprolol succinate  100 mg Oral Daily  . mometasone-formoterol  2 puff Inhalation BID  . pantoprazole  40 mg Oral Daily  . polyethylene glycol  17 g Oral BID  . pregabalin  150 mg Oral BID  . senna-docusate  1 tablet Oral BID   Continuous Infusions: . meropenem (MERREM) IV Stopped (10/13/16 0230)     LOS: 2 days    Time spent: 65min    Domenic Polite, MD [facility] Pager [phone]  If 7PM-7AM, please contact night-coverage [email or web address] Password [facility] 10/13/2016, 11:32 AM

## 2016-10-14 ENCOUNTER — Inpatient Hospital Stay (HOSPITAL_COMMUNITY): Payer: Medicare Other

## 2016-10-14 DIAGNOSIS — E039 Hypothyroidism, unspecified: Secondary | ICD-10-CM

## 2016-10-14 DIAGNOSIS — K219 Gastro-esophageal reflux disease without esophagitis: Secondary | ICD-10-CM

## 2016-10-14 DIAGNOSIS — I5032 Chronic diastolic (congestive) heart failure: Secondary | ICD-10-CM

## 2016-10-14 LAB — ECHOCARDIOGRAM COMPLETE
HEIGHTINCHES: 62 in
WEIGHTICAEL: 2310.42 [oz_av]

## 2016-10-14 MED ORDER — SODIUM CHLORIDE 0.9 % IV BOLUS (SEPSIS)
500.0000 mL | Freq: Once | INTRAVENOUS | Status: AC
Start: 2016-10-14 — End: 2016-10-14
  Administered 2016-10-14: 500 mL via INTRAVENOUS

## 2016-10-14 MED ORDER — MORPHINE SULFATE (PF) 4 MG/ML IV SOLN
1.0000 mg | INTRAVENOUS | Status: DC | PRN
  Administered 2016-10-15 (×4): 1 mg via INTRAVENOUS
  Filled 2016-10-14 (×5): qty 1

## 2016-10-14 MED ORDER — SODIUM CHLORIDE 0.9 % IV SOLN
INTRAVENOUS | Status: DC
  Administered 2016-10-15: 03:00:00 via INTRAVENOUS

## 2016-10-14 NOTE — Progress Notes (Signed)
Attempted to wean patient to RA. Oxygen saturated dropped to 86% on RA while in bed and 84% while walking. Oxygen saturation increased to 95% on 2L O2 while in bed and ambulating.  Barbee Shropshire. Brigitte Pulse, RN

## 2016-10-14 NOTE — Progress Notes (Signed)
SATURATION QUALIFICATIONS: (This note is used to comply with regulatory documentation for home oxygen)  Patient Saturations on Room Air at Rest = 86%  Patient Saturations on Room Air while Ambulating = 84%  Patient Saturations on 2 Liters of oxygen while Ambulating = 95%  Please briefly explain why patient needs home oxygen: Patient desaturates to 84% on RA while ambulating and 86% while at rest

## 2016-10-14 NOTE — Progress Notes (Signed)
  Echocardiogram 2D Echocardiogram has been performed.  Marcelus Dubberly L Androw 10/14/2016, 11:05 AM

## 2016-10-14 NOTE — Progress Notes (Signed)
PROGRESS NOTE    Isabel Collins  GEX:[id number] DOB: 11-11-1940 DOA: 10/11/2016 PCP: [name], MD  Brief Narrative: Isabel Collins is a 76 y.o. 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 Karsten Ro 08/27/2016. She was supposed to follow-up, but missed appointment with Dr. [name] to discuss right nephroureterectomy, rescheduled, now with admitted with Nausea/vomiting, flank pain, constipation, UA abnormal, pyelonephritis suspected, admitted, now on IV meropenem   Assessment & Plan:   UTI/ pyelonephritis with recent h/o ureteroscopy/biopsy and stent  -Biopsy with high grade Urothelial CA - Day2  IV meropenem for recent ESBL Escherichia coli urinary infection - Urine cultures now with greater than 100,000 colonies of gram-negative rods await ID and sensitivity - stop IVF - appreciate Urology consult  Hypertension -Continue metoprolol   Chronic obstructive pulmonary disease ([place]) -stable, bronchodilators as needed. -wean off O2    Type 2 diabetes mellitus with complication, without long-term current use of insulin (HCC) -not on meds, check Hba1c, SSI for now      Gastroesophageal reflux disease Protonix 40 mg by mouth daily.    Chronic pain syndrome -he is on Cymbalta 60 mg po in the mornings and 30 mg po in the afternoon. -will cut down to 30mg  BID    Dyslipidemia -Continue atorvastatin 40 mg by mouth at bedtime.    Hypothyroidism Continue levothyroxine 75 g by mouth daily.      Chronic constipation -Bm after fleets enema -continue miralax and senokot  DVT prophylaxis: Heparin SQ. Code Status: Full code. Family Communication:  Disposition Plan:  home in 1-2days, pending urine Cx ID and sensitivity  Consultants:   Urology   Procedures:   Antimicrobials:   Zosyn 8/9-8/10  Meropenem 8/10   Subjective: -complains of low back pain, no N/V  Objective: Vitals:   10/13/16 1519 10/13/16 2011 10/13/16 2224 10/14/16 0507  BP: 121/61 (!) 128/58  (!) 127/49  Pulse: 81 87  78  Resp: 12 15  15   Temp: 98.2 F (36.8 C) 98.5 F (36.9 C)  98.2 F (36.8 C)  TempSrc:  Oral  Oral  SpO2: 91% 93% 93% 96%  Weight:      Height:        Intake/Output Summary (Last 24 hours) at 10/14/16 1210 Last data filed at 10/14/16 0858  Gross per 24 hour  Intake              780 ml  Output                0 ml  Net              780 ml   Filed Weights   10/11/16 1856 10/12/16 0102  Weight: 61.7 kg (136 lb) 65.5 kg (144 lb 6.4 oz)    Examination:  Gen: Awake, Alert, Oriented X 3, no distress HEENT: PERRLA, Neck supple, no JVD Lungs: Good air movement bilaterally, CTAB CVS: RRR,No Gallops,Rubs or new Murmurs Abd: soft, Non tender, non distended, BS present, mild R flank tenderness Extremities: No Cyanosis, Clubbing or edema Skin: no new rashes  Data Reviewed:   CBC:  Recent Labs Lab 10/11/16 2000 10/13/16 1150  WBC 12.5* 12.4*  NEUTROABS 9.0* 8.4*  HGB 12.8 11.6*  HCT 40.1 36.2  MCV  83.4 85.2  PLT 238 299   Basic Metabolic Panel:  Recent Labs Lab 10/11/16 2000 10/13/16 1150  NA 134* 135  K 3.6 3.3*  CL 95* 101  CO2 30 26  GLUCOSE 126* 156*  BUN 5* 7  CREATININE 0.98 0.84  CALCIUM 8.9 8.0*   GFR: Estimated Creatinine Clearance: 49.8 mL/min (by C-G formula based on SCr of 0.84 mg/dL). Liver Function Tests:  Recent Labs Lab 10/11/16 2000  AST 20  ALT 8*  ALKPHOS 88  BILITOT 1.0  PROT 6.2*  ALBUMIN 3.0*   No results for input(s): LIPASE, AMYLASE in the last 168 hours. No results for input(s): AMMONIA in the last 168 hours. Coagulation Profile: No results for input(s): INR, PROTIME in the last 168  hours. Cardiac Enzymes: No results for input(s): CKTOTAL, CKMB, CKMBINDEX, TROPONINI in the last 168 hours. BNP (last 3 results) No results for input(s): PROBNP in the last 8760 hours. HbA1C: No results for input(s): HGBA1C in the last 72 hours. CBG: No results for input(s): GLUCAP in the last 168 hours. Lipid Profile: No results for input(s): CHOL, HDL, LDLCALC, TRIG, CHOLHDL, LDLDIRECT in the last 72 hours. Thyroid Function Tests: No results for input(s): TSH, T4TOTAL, FREET4, T3FREE, THYROIDAB in the last 72 hours. Anemia Panel: No results for input(s): VITAMINB12, FOLATE, FERRITIN, TIBC, IRON, RETICCTPCT in the last 72 hours. Urine analysis:    Component Value Date/Time   COLORURINE AMBER (A) 10/11/2016 2000   APPEARANCEUR CLOUDY (A) 10/11/2016 2000   LABSPEC 1.005 10/11/2016 2000   PHURINE 7.0 10/11/2016 2000   GLUCOSEU NEGATIVE 10/11/2016 2000   HGBUR MODERATE (A) 10/11/2016 2000   BILIRUBINUR NEGATIVE 10/11/2016 2000   BILIRUBINUR small (A) 07/26/2016 1432   KETONESUR NEGATIVE 10/11/2016 2000   PROTEINUR 100 (A) 10/11/2016 2000   UROBILINOGEN 0.2 07/26/2016 1432   NITRITE NEGATIVE 10/11/2016 2000   LEUKOCYTESUR LARGE (A) 10/11/2016 2000   Sepsis Labs: @LABRCNTIP (procalcitonin:4,lacticidven:4)  ) Recent Results (from the past 240 hour(s))  Urine culture     Status: Abnormal (Preliminary result)   Collection Time: 10/11/16  8:00 PM  Result Value Ref Range Status   Specimen Description URINE, RANDOM  Final   Special Requests NONE  Final   Culture >=100,000 COLONIES/mL GRAM NEGATIVE RODS (A)  Final   Report Status PENDING  Incomplete  Blood culture (routine x 2)     Status: None (Preliminary result)   Collection Time: 10/11/16  9:43 PM  Result Value Ref Range Status   Specimen Description BLOOD RIGHT ANTECUBITAL  Final   Special Requests   Final    BOTTLES DRAWN AEROBIC AND ANAEROBIC Blood Culture adequate volume   Culture NO GROWTH 3 DAYS  Final   Report Status  PENDING  Incomplete  Blood culture (routine x 2)     Status: None (Preliminary result)   Collection Time: 10/11/16  9:45 PM  Result Value Ref Range Status   Specimen Description BLOOD RIGHT HAND  Final   Special Requests   Final    IN PEDIATRIC BOTTLE Blood Culture results may not be optimal due to an excessive volume of blood received in culture bottles   Culture NO GROWTH 3 DAYS  Final   Report Status PENDING  Incomplete         Radiology Studies: No results found.      Scheduled Meds: . atorvastatin  40 mg Oral QHS  . cycloSPORINE  1 drop Both Eyes BID  . DULoxetine  30 mg Oral Q1400  .  DULoxetine  30 mg Oral Q breakfast  . feeding supplement  1 Container Oral TID BM  . heparin  5,000 Units Subcutaneous Q8H  . levothyroxine  75 mcg Oral QAC breakfast  . metoprolol succinate  100 mg Oral Daily  . mometasone-formoterol  2 puff Inhalation BID  . pantoprazole  40 mg Oral Daily  . polyethylene glycol  17 g Oral BID  . pregabalin  150 mg Oral BID  . senna-docusate  1 tablet Oral BID   Continuous Infusions: . meropenem (MERREM) IV Stopped (10/14/16 0343)     LOS: 3 days    Time spent: 104min    Domenic Polite, MD [facility] Pager [phone]  If 7PM-7AM, please contact night-coverage [email or web address] Password [email or web address] 10/14/2016, 12:10 PM

## 2016-10-14 NOTE — Progress Notes (Signed)
Pt's temp 99.5, BP trending down at 88/48 manually. pt drowsy, c/o chills, and disoriented to time. MD notified.  Barbee Shropshire. Brigitte Pulse, RN

## 2016-10-15 DIAGNOSIS — Z960 Presence of urogenital implants: Secondary | ICD-10-CM

## 2016-10-15 LAB — CBC
HEMATOCRIT: 33.4 % — AB (ref 36.0–46.0)
Hemoglobin: 10.9 g/dL — ABNORMAL LOW (ref 12.0–15.0)
MCH: 27 pg (ref 26.0–34.0)
MCHC: 32.6 g/dL (ref 30.0–36.0)
MCV: 82.9 fL (ref 78.0–100.0)
PLATELETS: 163 10*3/uL (ref 150–400)
RBC: 4.03 MIL/uL (ref 3.87–5.11)
RDW: 14.5 % (ref 11.5–15.5)
WBC: 10.5 10*3/uL (ref 4.0–10.5)

## 2016-10-15 MED ORDER — SODIUM CHLORIDE 0.9 % IV SOLN
INTRAVENOUS | Status: AC
  Administered 2016-10-15: 09:00:00 via INTRAVENOUS

## 2016-10-15 NOTE — NC FL2 (Signed)
[place] LEVEL OF CARE SCREENING TOOL     IDENTIFICATION  Patient Name: Isabel Collins Birthdate: 03/22/1940 Sex: female Admission Date (Current Location): 10/11/2016  [facility] and [place] Number:  Herbalist and Address:  [facility],  [place] [address], [place]      Provider Number: [id number]  Attending Physician Name and Address:  Domenic Polite, MD  Relative Name and Phone Number:       Current Level of Care: Hospital Recommended Level of Care: [place] Prior Approval Number:    Date Approved/Denied:   PASRR Number: [id number]  Discharge Plan: SNF    Current Diagnoses: Patient Active Problem List   Diagnosis Date Noted  . Chronic constipation 10/12/2016  . Acute pyelonephritis 10/11/2016  . Right renal mass 10/08/2016  . Right flank pain, chronic 10/08/2016  . Chronic pain syndrome 10/08/2016  . History of recent fall 10/05/2016  . Physical deconditioning 10/01/2016  . Hypothyroidism 10/01/2016  . Chronic obstructive pulmonary disease ([place]) 10/01/2016  . Type 2 diabetes mellitus with complication, without long-term current use of insulin ([place]) 10/01/2016  . 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 Urothelial carcinoma of kidney, right ([place]) 10/01/2016  . Gastroesophageal reflux disease 10/01/2016  . Dyspnea on exertion 06/25/2016  . Fibromyalgia 05/23/2016  . 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 Hypertension 02/10/2016  . High cholesterol 02/10/2016  . Hiatal hernia 02/10/2016    Orientation RESPIRATION BLADDER Height & Weight     Self, Time, Situation, Place  O2 (2L) Continent Weight: 144 lb 6.4 oz (65.5 kg) Height:  5\' 2"  (157.5 cm)  BEHAVIORAL SYMPTOMS/MOOD NEUROLOGICAL BOWEL NUTRITION STATUS      Continent Diet (regular diet, thin fluid consistency)  AMBULATORY STATUS COMMUNICATION OF NEEDS Skin   Limited Assist Verbally Normal                        Personal Care Assistance Level of Assistance  Bathing, Feeding, Dressing Bathing Assistance: Limited assistance Feeding assistance: Independent Dressing Assistance: Limited assistance     Functional Limitations Info  Sight, Hearing, Speech Sight Info: Adequate (wears glasses) Hearing Info: Adequate Speech Info: Adequate    SPECIAL CARE FACTORS FREQUENCY  PT (By licensed PT), OT (By licensed OT)     PT Frequency: 5x OT Frequency: 5x            Contractures Contractures Info: Not present    Additional Factors Info  Code Status, Allergies, Isolation Precautions Code Status Info: full Allergies Info: nka     Isolation Precautions Info: contact precautions- e coli     Current Medications (10/15/2016):  This is the current hospital active medication list Current Facility-Administered Medications  Medication Dose Route Frequency Provider Last Rate Last Dose  . 0.9 %  sodium chloride infusion   Intravenous Continuous Domenic Polite, MD 75 mL/hr at 10/15/16 0915    . atorvastatin (LIPITOR) tablet 40 mg  40 mg Oral QHS Reubin Milan, MD   40 mg at 10/14/16 2103  . cycloSPORINE (RESTASIS) 0.05 % ophthalmic emulsion 1 drop  1 drop Both Eyes BID Reubin Milan, MD   1 drop at 10/15/16 0815  . DULoxetine (CYMBALTA) DR capsule 30 mg  30 mg Oral Q1400 Reubin Milan, MD   30 mg at 10/15/16 1248  . DULoxetine (CYMBALTA) DR capsule 30 mg  30 mg Oral Q breakfast Domenic Polite, MD   30 mg at  10/15/16 6967  . feeding supplement (BOOST / RESOURCE BREEZE) liquid 1 Container  1 Container Oral TID BM Reubin Milan, MD   1 Container at 10/15/16 1400  . fluticasone (FLONASE) 50 MCG/ACT nasal spray 2 spray  2 spray Each Nare QHS PRN Reubin Milan, MD      . heparin injection 5,000 Units  5,000 Units Subcutaneous Q8H Reubin Milan, MD   5,000 Units at 10/15/16 1247  . ipratropium-albuterol (DUONEB) 0.5-2.5 (3) MG/3ML nebulizer solution 3 mL  3 mL  Nebulization Q6H PRN Reubin Milan, MD      . levothyroxine (SYNTHROID, LEVOTHROID) tablet 75 mcg  75 mcg Oral QAC breakfast Reubin Milan, MD   75 mcg at 10/15/16 0813  . meropenem (MERREM) 1 g in sodium chloride 0.9 % 100 mL IVPB  1 g Intravenous Q12H Dara Hoyer, [facility]   Stopped at 10/15/16 1315  . metoprolol succinate (TOPROL-XL) 24 hr tablet 100 mg  100 mg Oral Daily Reubin Milan, MD   100 mg at 10/15/16 0813  . mometasone-formoterol (DULERA) 200-5 MCG/ACT inhaler 2 puff  2 puff Inhalation BID Reubin Milan, MD   2 puff at 10/15/16 0815  . morphine 4 MG/ML injection 1 mg  1 mg Intravenous Q3H PRN Domenic Polite, MD   1 mg at 10/15/16 0607  . ondansetron (ZOFRAN) tablet 4 mg  4 mg Oral Q6H PRN Reubin Milan, MD   4 mg at 10/14/16 0100   Or  . ondansetron [facility]) injection 4 mg  4 mg Intravenous Q6H PRN Reubin Milan, MD      . pantoprazole (PROTONIX) EC tablet 40 mg  40 mg Oral Daily Reubin Milan, MD   40 mg at 10/15/16 8938  . polyethylene glycol (MIRALAX / GLYCOLAX) packet 17 g  17 g Oral BID Domenic Polite, MD   17 g at 10/15/16 1000  . pregabalin (LYRICA) capsule 150 mg  150 mg Oral BID Reubin Milan, MD   150 mg at 10/15/16 1017  . senna-docusate (Senokot-S) tablet 1 tablet  1 tablet Oral BID Domenic Polite, MD   1 tablet at 10/15/16 5102     Discharge Medications: Please see discharge summary for a list of discharge medications.  Relevant Imaging Results:  Relevant Lab Results:   Additional Information SS# [id number]  Nila Nephew, LCSW

## 2016-10-15 NOTE — Evaluation (Signed)
Physical Therapy Evaluation Patient Details Name: Isabel Collins MRN: [id number] DOB: 10-Jul-1940 Today's Date: 10/15/2016    SATURATION QUALIFICATIONS: (This note is used to comply with regulatory documentation for home oxygen)  Patient Saturations on Room Air at Rest = 93%  Patient Saturations on Room Air while Ambulating = 90%    History of Present Illness  76 yo female admitted with acute pyelonephritis. Hx of chronic anemai, OA, COPD, anxiety, fibromyalgia.   Clinical Impression  On eval, pt required Min assist for mobility. She walked ~115 feet with a RW. Pt presents with general weakness, decreased activity tolerance, and impaired gait and balance. Discussed d/c plan-pt is open to ST rehab at [facility]. She reports having difficulty managing well at home alone. Recommend ST rehab at SNF prior to returning home to improve functional mobility and maximize independence. Will follow during hospital stay.     Follow Up Recommendations SNF    Equipment Recommendations  None recommended by PT    Recommendations for Other Services       Precautions / Restrictions Precautions Precautions: Fall Precaution Comments: monitor O2 sats Restrictions Weight Bearing Restrictions: No      Mobility  Bed Mobility Overal bed mobility: Modified Independent                Transfers Overall transfer level: Needs assistance Equipment used: Rolling walker (2 wheeled) Transfers: Sit to/from Stand Sit to Stand: Min guard         General transfer comment: close guard for safety. Pt initially asked for therapist's hand to pull up on. Instructed pt to attempt to stand unassisted. Cues for hand placement however pt preferred to pull up on walker.   Ambulation/Gait Ambulation/Gait assistance: Min assist Ambulation Distance (Feet): 115 Feet Assistive device: Rolling walker (2 wheeled) Gait Pattern/deviations: Step-through pattern;Decreased stride length;Staggering left;Staggering  right;Drifts right/left     General Gait Details: Unsteady-required assist to steady during ambulation. O2 sat 90% on RA, dyspnea 2/4 during ambulation. Pt fatigues fairly easily.   Stairs            Wheelchair Mobility    Modified Rankin (Stroke Patients Only)       Balance Overall balance assessment: Needs assistance;History of Falls         Standing balance support: Bilateral upper extremity supported Standing balance-Leahy Scale: Poor                               Pertinent Vitals/Pain Pain Assessment: Faces Faces Pain Scale: Hurts little more Pain Location: R flank, R shoulder Pain Descriptors / Indicators: Sore;Aching Pain Intervention(s): Limited activity within patient's tolerance;Repositioned    Home Living Family/patient expects to be discharged to:: Unsure Living Arrangements: Alone Available Help at Discharge: Personal care attendant (3 hours/day, every other day) Type of Home: House Home Access: Elevator     Home Layout: One level Home Equipment: Environmental consultant - 2 wheels      Prior Function Level of Independence: Independent with assistive device(s)               Hand Dominance        Extremity/Trunk Assessment   Upper Extremity Assessment Upper Extremity Assessment: Generalized weakness    Lower Extremity Assessment Lower Extremity Assessment: Generalized weakness    Cervical / Trunk Assessment Cervical / Trunk Assessment: Normal  Communication   Communication: No difficulties  Cognition Arousal/Alertness: Awake/alert Behavior During Therapy: WFL for tasks assessed/performed Overall Cognitive  Status: Within Functional Limits for tasks assessed                                        General Comments      Exercises     Assessment/Plan    PT Assessment Patient needs continued PT services  PT Problem List Decreased strength;Decreased mobility;Decreased activity tolerance;Decreased  balance;Decreased knowledge of use of DME;Pain       PT Treatment Interventions DME instruction;Therapeutic activities;Therapeutic exercise;Patient/family education;Balance training;Gait training;Functional mobility training    PT Goals (Current goals can be found in the Care Plan section)  Acute Rehab PT Goals Patient Stated Goal: to get some strength, energy PT Goal Formulation: With patient Time For Goal Achievement: 10/07/2016 Potential to Achieve Goals: Good    Frequency Min 3X/week   Barriers to discharge        Co-evaluation               AM-PAC PT "6 Clicks" Daily Activity  Outcome Measure Difficulty turning over in bed (including adjusting bedclothes, sheets and blankets)?: None Difficulty moving from lying on back to sitting on the side of the bed? : None Difficulty sitting down on and standing up from a chair with arms (e.g., wheelchair, bedside commode, etc,.)?: A Little Help needed moving to and from a bed to chair (including a wheelchair)?: A Little Help needed walking in hospital room?: A Little Help needed climbing 3-5 steps with a railing? : A Little 6 Click Score: 20    End of Session Equipment Utilized During Treatment: Gait belt Activity Tolerance: Patient limited by fatigue Patient left: in bed;with call bell/phone within reach;with bed alarm set   PT Visit Diagnosis: Muscle weakness (generalized) (M62.81);Difficulty in walking, not elsewhere classified (R26.2)    Time: 9179-1505 PT Time Calculation (min) (ACUTE ONLY): 25 min   Charges:   PT Evaluation $PT Eval Low Complexity: 1 Low PT Treatments $Gait Training: 8-22 mins   PT G Codes:         Weston Anna, MPT Pager: [phone]

## 2016-10-15 NOTE — Telephone Encounter (Signed)
Spoke with nurse Geni Bers from [facility] received personal care services information and plan of care is in place.

## 2016-10-15 NOTE — Care Management Important Message (Addendum)
Important Message  Patient Details IM Letter given to Cookie/Case Manager to present to Patient Name: Isabel Collins MRN: [id number] Date of Birth: 1940/06/30   Medicare Important Message Given:  Yes    [name] 10/15/2016, 12:46 [place] Message  Patient Details  Name: Isabel Collins MRN: [id number] Date of Birth: [birth year]   Medicare Important Message Given:  Yes    [name] 10/15/2016, 12:46 PM

## 2016-10-15 NOTE — Progress Notes (Signed)
PROGRESS NOTE    Isabel Collins  YQI:[id number] DOB: 05/15/1940 DOA: 10/11/2016 PCP: [name], MD  Brief Narrative: Isabel Collins is a 76 y.o. 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 Karsten Ro 08/27/2016. She was supposed to follow-up, but missed appointment with Dr. [name] to discuss right nephroureterectomy, rescheduled, now with admitted with Nausea/vomiting, flank pain, constipation, UA abnormal, pyelonephritis suspected, admitted, now on IV meropenem   Assessment & Plan:   UTI/ pyelonephritis with recent h/o ureteroscopy/biopsy and stent  -Biopsy with high grade Urothelial CA - Day 4 of  IV meropenem for recent ESBL Escherichia coli urinary infection - Urine cultures now with greater than 100,000 colonies of Ecoli awaiting sensitivity -low grade temp yesterday with drop in BP, now better - remains symptomatic likely from CA/Stent - appreciate Urology consult, recommended after current Abx course to start nightly nitrofurantoin till day of surgery -ambulate, OOB, PT eval  Hypertension -Continue metoprolol   Chronic obstructive pulmonary disease ([place]) -stable, bronchodilators as needed. -wean off O2    Type 2 diabetes mellitus with complication, without long-term current use of insulin (HCC) -not on meds, check Hba1c, SSI for now      Gastroesophageal reflux disease Protonix 40 mg by mouth daily.    Chronic pain syndrome -on Cymbalta 60 mg po in the mornings and 30 mg po in the afternoon. -cut down to 30mg  BID    Dyslipidemia -Continue atorvastatin 40 mg by mouth at bedtime.    Hypothyroidism Continue levothyroxine 75 g by mouth daily.    Chronic constipation -Bm after fleets enema -continue miralax and senokot  DVT prophylaxis: Heparin SQ. Code Status: Full code. Family Communication:  Disposition Plan: home tomorrow if stable  Consultants:   Urology   Procedures:   Antimicrobials:   Zosyn 8/9-8/10  Meropenem 8/10   Subjective: -still has pain at R kidney, no fevers/chills  Objective: Vitals:   10/14/16 1734 10/14/16 2050 10/14/16 2342 10/15/16 0507  BP: (!) 88/48 (!) 109/56  (!) 115/55  Pulse:  80  84  Resp:  17  19  Temp:  98.8 F (37.1 C)  98.2 F (36.8 C)  TempSrc:  Oral  Oral  SpO2:  95% 92% 95%  Weight:      Height:        Intake/Output Summary (Last 24 hours) at 10/15/16 1150 Last data filed at 10/15/16 1000  Gross per 24 hour  Intake             1085 ml  Output                0 ml  Net             1085 ml   Filed Weights   10/11/16 1856 10/12/16 0102  Weight: 61.7 kg (136 lb) 65.5 kg (144 lb 6.4 oz)    Examination:  Gen: Awake, Alert, Oriented X 3, no distress HEENT: PERRLA, Neck supple, no JVD Lungs: Good air movement bilaterally, CTAB CVS: RRR,No Gallops,Rubs or new Murmurs Abd: soft, Non tender, non distended, BS present, mild R flank tenderness Extremities: No Cyanosis, Clubbing or edema Skin: no new rashes   Data Reviewed:   CBC:  Recent Labs Lab 10/11/16 2000 10/13/16 1150 10/15/16 0448  WBC 12.5* 12.4* 10.5  NEUTROABS 9.0* 8.4*  --   HGB 12.8 11.6* 10.9*  HCT 40.1 36.2 33.4*  MCV 83.4 85.2 82.9  PLT 238 208 220   Basic Metabolic Panel:  Recent Labs Lab 10/11/16 2000 10/13/16 1150  NA 134* 135  K 3.6 3.3*  CL 95* 101  CO2 30 26  GLUCOSE 126* 156*  BUN 5* 7  CREATININE 0.98 0.84  CALCIUM 8.9 8.0*   GFR: Estimated Creatinine Clearance: 49.8 mL/min (by C-G formula based on SCr of 0.84 mg/dL). Liver Function Tests:  Recent Labs Lab 10/11/16 2000  AST 20  ALT 8*  ALKPHOS 88  BILITOT 1.0  PROT 6.2*  ALBUMIN 3.0*   No results for  input(s): LIPASE, AMYLASE in the last 168 hours. No results for input(s): AMMONIA in the last 168 hours. Coagulation Profile: No results for input(s): INR, PROTIME in the last 168 hours. Cardiac Enzymes: No results for input(s): CKTOTAL, CKMB, CKMBINDEX, TROPONINI in the last 168 hours. BNP (last 3 results) No results for input(s): PROBNP in the last 8760 hours. HbA1C: No results for input(s): HGBA1C in the last 72 hours. CBG: No results for input(s): GLUCAP in the last 168 hours. Lipid Profile: No results for input(s): CHOL, HDL, LDLCALC, TRIG, CHOLHDL, LDLDIRECT in the last 72 hours. Thyroid Function Tests: No results for input(s): TSH, T4TOTAL, FREET4, T3FREE, THYROIDAB in the last 72 hours. Anemia Panel: No results for input(s): VITAMINB12, FOLATE, FERRITIN, TIBC, IRON, RETICCTPCT in the last 72 hours. Urine analysis:    Component Value Date/Time   COLORURINE AMBER (A) 10/11/2016 2000   APPEARANCEUR CLOUDY (A) 10/11/2016 2000   LABSPEC 1.005 10/11/2016 2000   PHURINE 7.0 10/11/2016 2000   GLUCOSEU NEGATIVE 10/11/2016 2000   HGBUR MODERATE (A) 10/11/2016 2000   BILIRUBINUR NEGATIVE 10/11/2016 2000   BILIRUBINUR small (A) 07/26/2016 1432   KETONESUR NEGATIVE 10/11/2016 2000   PROTEINUR 100 (A) 10/11/2016 2000   UROBILINOGEN 0.2 07/26/2016 1432   NITRITE NEGATIVE 10/11/2016 2000   LEUKOCYTESUR LARGE (A) 10/11/2016 2000   Sepsis Labs: @LABRCNTIP (procalcitonin:4,lacticidven:4)  ) Recent Results (from the past 240 hour(s))  Urine culture     Status: Abnormal (Preliminary result)   Collection Time: 10/11/16  8:00 PM  Result Value Ref Range Status   Specimen Description URINE, RANDOM  Final   Special Requests NONE  Final   Culture (A)  Final    >=100,000 COLONIES/mL ESCHERICHIA COLI REPEATING SUSCEPTIBILITIES    Report Status PENDING  Incomplete  Blood culture (routine x 2)     Status: None (Preliminary result)   Collection Time: 10/11/16  9:43 PM  Result Value Ref  Range Status   Specimen Description BLOOD RIGHT ANTECUBITAL  Final   Special Requests   Final    BOTTLES DRAWN AEROBIC AND ANAEROBIC Blood Culture adequate volume   Culture NO GROWTH 3 DAYS  Final   Report Status PENDING  Incomplete  Blood culture (routine x 2)     Status: None (Preliminary result)   Collection Time: 10/11/16  9:45 PM  Result Value Ref Range Status   Specimen Description BLOOD RIGHT HAND  Final   Special Requests   Final    IN PEDIATRIC BOTTLE Blood Culture results may not be optimal due to an excessive volume of blood received in culture bottles   Culture NO GROWTH 3 DAYS  Final   Report Status PENDING  Incomplete         Radiology Studies: No results found.  Scheduled Meds: . atorvastatin  40 mg Oral QHS  . cycloSPORINE  1 drop Both Eyes BID  . DULoxetine  30 mg Oral Q1400  . DULoxetine  30 mg Oral Q breakfast  . feeding supplement  1 Container Oral TID BM  . heparin  5,000 Units Subcutaneous Q8H  . levothyroxine  75 mcg Oral QAC breakfast  . metoprolol succinate  100 mg Oral Daily  . mometasone-formoterol  2 puff Inhalation BID  . pantoprazole  40 mg Oral Daily  . polyethylene glycol  17 g Oral BID  . pregabalin  150 mg Oral BID  . senna-docusate  1 tablet Oral BID   Continuous Infusions: . sodium chloride 75 mL/hr at 10/15/16 0915  . meropenem (MERREM) IV Stopped (10/15/16 0245)     LOS: 4 days    Time spent: 67min    Domenic Polite, MD [facility] Pager [phone]  If 7PM-7AM, please contact night-coverage [email or web address] Password [facility] 10/15/2016, 11:50 AM

## 2016-10-15 NOTE — Clinical Social Work Note (Signed)
Clinical Social Work Assessment  Patient Details  Name: Isabel Collins MRN: [id number] Date of Birth: Aug 07, 1940  Date of referral:  10/15/16               Reason for consult:  Facility Placement                Permission sought to share information with:    Permission granted to share information::     Name::        Agency::     Relationship::     Contact Information:     Housing/Transportation Living arrangements for the past 2 months:  Apartment Source of Information:  Patient Patient Interpreter Needed:  None Criminal Activity/Legal Involvement Pertinent to Current Situation/Hospitalization:  No - Comment as needed Significant Relationships:  Friend, [employer] Lives with:  Self Do you feel safe going back to the place where you live?  Yes Need for family participation in patient care:  No (Coment)  Care giving concerns:  Pt from home where she lives alone in an apartment. Is on 3rd floor but has use of elevator. States she is concerned that she would not do well at home at DC "because she can't get up out of bed good, has lots of pain." Uses rolling walker to ambulate independently. Is on O2 in hospital, states she did not need at home. Has friends and support from church community but reports no one close and no family.   Social Worker assessment / plan:  CSW consulted for potential SNF placement. Reviewed pt's records and evaluation by therapy- discussed with pt. Although she is ambulating with minimal assistance 115 ft, she states this is far from her baseline ability. Wants to pursue [facility].   Pt reports she has anxiety dx but unsure of origin, no medications or psychiatric treatment hx to her recollection.  CSW provided pt list of area SNFs and pt consented to referrals, has no facility preference.   Obtained PASSR, completed FL2 and made referrals.   Plan: Potentially SNF at DC- will follow up with bed offers.    Employment status:   Retired Nurse, adult PT Recommendations:  [place] / Referral to community resources:  [place]  Patient/Family's Response to care:  Pt appreciative of care  Patient/Family's Understanding of and Emotional Response to Diagnosis, Current Treatment, and Prognosis:  Pt demonstrates adequate understanding of her treatment here and potential DC plans. Also understands barriers which may present ( 1) discussed that, although her insurance does not require pre-auth, there is possibility therapy may not be authorized for many days due to her functional status could be deemed too high for SNF level of rehab, 2) Pt states she has no friends/family to visit SNFs to help her decide which facilities she is interested in)  Emotional Assessment Appearance:  Appears stated age Attitude/Demeanor/Rapport:   (pleasant, appropriate) Affect (typically observed):  Accepting, Adaptable, Calm Orientation:  Oriented to Self, Oriented to Place, Oriented to  Time, Oriented to Situation Alcohol / Substance use:  Not Applicable Psych involvement (Current and /or in the community):  No (Comment)  Discharge Needs  Concerns to be addressed:  Discharge Planning Concerns Readmission within the last 30 days:  No Current discharge risk:  None Barriers to Discharge:  Continued Medical Work up   [employer], LCSW 10/15/2016, 3:01 PM  [phone]

## 2016-10-16 LAB — CULTURE, BLOOD (ROUTINE X 2)
CULTURE: NO GROWTH
CULTURE: NO GROWTH
SPECIAL REQUESTS: ADEQUATE

## 2016-10-16 LAB — URINE CULTURE

## 2016-10-16 MED ORDER — NITROFURANTOIN MONOHYD MACRO 100 MG PO CAPS: 100.0000 mg | ORAL_CAPSULE | Freq: Two times a day (BID) | ORAL | Status: AC

## 2016-10-16 MED ORDER — POLYETHYLENE GLYCOL 3350 17 G PO PACK: 17.0000 g | PACK | Freq: Every day | ORAL | 0 refills | Status: AC | PRN

## 2016-10-16 MED ORDER — DULOXETINE HCL 30 MG PO CPEP: 30.0000 mg | ORAL_CAPSULE | Freq: Two times a day (BID) | ORAL | 0 refills | Status: AC

## 2016-10-16 MED ORDER — MORPHINE SULFATE (PF) 2 MG/ML IV SOLN
1.0000 mg | INTRAVENOUS | Status: DC | PRN
  Administered 2016-10-16: 1 mg via INTRAVENOUS
  Filled 2016-10-16: qty 1

## 2016-10-16 MED ORDER — HYDROCODONE-ACETAMINOPHEN 5-325 MG PO TABS: 1.0000 | ORAL_TABLET | Freq: Four times a day (QID) | ORAL | 0 refills | Status: AC | PRN

## 2016-10-16 MED ORDER — SENNOSIDES-DOCUSATE SODIUM 8.6-50 MG PO TABS: 1.0000 | ORAL_TABLET | Freq: Two times a day (BID) | ORAL | Status: AC

## 2016-10-16 MED ORDER — HYDROCODONE-ACETAMINOPHEN 5-325 MG PO TABS
1.0000 | ORAL_TABLET | Freq: Four times a day (QID) | ORAL | Status: DC | PRN
  Administered 2016-10-16: 2 via ORAL
  Filled 2016-10-16: qty 2

## 2016-10-16 NOTE — Discharge Summary (Signed)
Physician Discharge Summary  Isabel Collins YFV:[id number] DOB: [birth year] DOA: 10/11/2016  PCP: [name], MD  Admit date: 10/11/2016 Discharge date: 10/16/2016  Time spent: 35 minutes  Recommendations for Outpatient Follow-up:  1. Ensure Follow up with [facility], August 20 at 10:30 AM for pre-op visit and then right nephroureterectomy on August 30. 2. PCP Dr.Shaw in 1 week 3. Ensure daily nitrofurantoin till surgery   Discharge Diagnoses:  Principal Problem:   Acute pyelonephritis Active Problems:   Hypertension   High cholesterol   Hypothyroidism   Chronic obstructive pulmonary disease (HCC)   Type 2 diabetes mellitus with complication, without long-term current use of insulin (HCC)   Retained ureteral stent   Gastroesophageal reflux disease   Chronic pain syndrome   Chronic constipation   Discharge Condition: stable  Diet recommendation:heart healthy  Filed Weights   10/11/16 1856 10/12/16 0102  Weight: 61.7 kg (136 lb) 65.5 kg (144 lb 6.4 oz)    History of present illness:  Isabel Payeur Lightcapis a 76 y.o.femalewith medical history significant of chronic anemia, anxiety, chronic constipation, COPD, osteoarthritis of spine, colon diverticulosis, fibromyalgia, GERD, history of gross hematuria, hypothyroidism IBS, was 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 Karsten Ro 08/27/2016. She was supposed to follow-up, butmissed appointment with Dr. [name] to discuss right nephroureterectomy, rescheduled, admitted with Nausea/vomiting, flank pain, constipation, UA abnormal, pyelonephritis suspected  Hospital Course:   UTI/ pyelonephritis with recent h/o ureteroscopy/biopsy and stent  -Recent diagnosis Right upper pole transitional cell carcinoma  - Urine Cx with ESBl Ecoli, treated with 5 days of IV meropenem with good clinical response - she still has some  residual pain likely from CA/Stent, seen by urology in consultation, recommended after current Abx course to start nightly nitrofurantoin till day of surgery -to start Po Nitrofurantoin today until surgery which is scheduled for on 8/30 with Dr.Herrick  Debility/weakness -for ST rehab at discharge  Hypertension -Continue metoprolol   Chronic obstructive pulmonary disease ([place]) -stable, bronchodilators as needed.  Type 2 diabetes mellitus with complication, without long-term current use of insulin (HCC) -not on meds, CBGs stable, Hba1c pending at discharge  Gastroesophageal reflux disease Protonix 40 mg by mouth daily.  Chronic pain syndrome -on Cymbalta 60 mg po in the mornings and 30 mg po in the afternoon. -cut down to 30mg  BID  Dyslipidemia -Continue atorvastatin 40 mg by mouth at bedtime.  Hypothyroidism Continue levothyroxine 75 g by mouth daily.  Chronic constipation -Bm after fleets enema -continue miralax and senokot   Consultations:  Urology  Discharge Exam: Vitals:   10/16/16 0626 10/16/16 0921  BP: (!) 108/51   Pulse: 78 79  Resp: 16 16  Temp: 98.2 F (36.8 C)   SpO2: 96% 97%    General: AAOx3 Cardiovascular: S1S2/RRR Respiratory: CTAB  Discharge Instructions   Discharge Instructions    Diet - low sodium heart healthy    Complete by:  As directed    Increase activity slowly    Complete by:  As directed      Current Discharge Medication List    START taking these medications   Details  nitrofurantoin, macrocrystal-monohydrate, (MACROBID) 100 MG capsule Take 1 capsule (100 mg total) by mouth 2 (two) times daily. For 2 weeks, until Kidney surgery    polyethylene glycol (MIRALAX / GLYCOLAX) packet Take 17 g by mouth daily as needed for mild constipation. Qty: 14 each, Refills:  0    senna-docusate (SENOKOT-S) 8.6-50 MG tablet Take 1 tablet by mouth 2 (two) times daily.      CONTINUE these medications which have  CHANGED   Details  DULoxetine (CYMBALTA) 30 MG capsule Take 1 capsule (30 mg total) by mouth 2 (two) times daily. Take two capsules po in the morning and one capsule po in the afternoon. Refills: 0    HYDROcodone-acetaminophen (NORCO/VICODIN) 5-325 MG tablet Take 1 tablet by mouth every 6 (six) hours as needed for moderate pain. Qty: 20 tablet, Refills: 0      CONTINUE these medications which have NOT CHANGED   Details  ADVAIR DISKUS 250-50 MCG/DOSE AEPB Inhale 1 puff into the lungs 2 (two) times daily as needed (shortness of breath).  Refills: 1    albuterol (PROVENTIL HFA;VENTOLIN HFA) 108 (90 Base) MCG/ACT inhaler Inhale 1-2 puffs into the lungs every 6 (six) hours as needed for wheezing. Qty: 1 Inhaler, Refills: 0    atorvastatin (LIPITOR) 40 MG tablet Take 1 tablet (40 mg total) by mouth every evening. Qty: 90 tablet, Refills: 0    bisacodyl (DULCOLAX) 5 MG EC tablet Take 5 mg by mouth at bedtime as needed for moderate constipation.     Cholecalciferol (VITAMIN D3) 400 units CAPS Take 400 Units by mouth daily.    cycloSPORINE (RESTASIS) 0.05 % ophthalmic emulsion Place 1 drop into both eyes 2 (two) times daily. Qty: 0.4 mL, Refills: 3    fluticasone (FLONASE) 50 MCG/ACT nasal spray Place 2 sprays into both nostrils at bedtime as needed for allergies or rhinitis. Qty: 16 g, Refills: 3    levothyroxine (SYNTHROID, LEVOTHROID) 75 MCG tablet Take 1 tablet (75 mcg total) by mouth daily before breakfast. Qty: 90 tablet, Refills: 1    metoprolol succinate (TOPROL-XL) 100 MG 24 hr tablet Take 1 tablet (100 mg total) by mouth daily. Take with or immediately following a meal. Qty: 90 tablet, Refills: 0    NEXIUM 40 MG capsule Take 1 capsule (40 mg total) by mouth daily. Qty: 30 capsule, Refills: 5    polyvinyl alcohol (LIQUIFILM TEARS) 1.4 % ophthalmic solution Place 1 drop into both eyes every 4 (four) hours as needed for dry eyes.    pregabalin (LYRICA) 150 MG capsule Take 1  capsule (150 mg total) by mouth 2 (two) times daily. Qty: 180 capsule, Refills: 0    PRESCRIPTION MEDICATION Take 0.25 tablets by mouth at bedtime as needed (SLEEP). Sleep Medicine, patient is unsure of name or strength.      STOP taking these medications     cefpodoxime (VANTIN) 200 MG tablet        No Known Allergies Contact information for after-discharge care    Destination    [facility] .   Specialty:  Skilled Nursing Facility Contact information: [place] [phone]               The results of significant diagnostics from this hospitalization (including imaging, microbiology, ancillary and laboratory) are listed below for reference.    Significant Diagnostic Studies: Dg Abd 2 Views  Result Date: 10/11/2016 CLINICAL DATA:  Constipation EXAM: ABDOMEN - 2 VIEW COMPARISON:  09/17/2016 CT FINDINGS: A moderate amount of fecal retention is seen within large bowel consistent with constipation. A right-sided ureteral stent is noted, similar in appearance and orientation to prior CT scout view of the abdomen and pelvis. There is no evidence of free air. No radio-opaque calculi or other significant  radiographic abnormality is seen. Cholecystectomy clips are seen in the right upper quadrant. There is thoracolumbar spondylosis with mild levoscoliosis. IMPRESSION: Findings consistent with constipation. Electronically Signed   By: Ashley Royalty M.D.   On: 10/11/2016 19:48   Ct Renal Stone Study  Result Date: 09/17/2016 CLINICAL DATA:  76 year old hypertensive female with abdominal and flank pain. Stent placed on right side. Pain radiating to hip. Intermittent left abdominal pain. Vaginal discharge. Post cholecystectomy. Initial encounter. EXAM: CT ABDOMEN AND PELVIS WITHOUT CONTRAST TECHNIQUE: Multidetector CT imaging of the abdomen and pelvis was performed following the standard protocol without IV contrast. COMPARISON:  08/27/2016  intraoperative C-arm.  07/19/2016 CT. FINDINGS: Lower chest: Minimal basilar scarring/atelectasis. Heart size within normal limits. Moderate-size hiatal hernia. Hepatobiliary: Taking into account limitation by non contrast imaging, no worrisome hepatic lesion. Postcholecystectomy. Pancreas: Taking into account limitation by non contrast imaging, no pancreatic mass or inflammation Spleen: Taking into account limitation by non contrast imaging, no mass. Top-normal length. Adrenals/Urinary Tract: Stent has been placed into the right renal collecting system. Proximal pigtail catheter is not completely formed. Loop like component bladder aspect. 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 The right ureter remains dilated and therefore the stent may not be functioning properly. No left-sided hydronephrosis. Mild hyperplasia adrenal glands. Stomach/Bowel: Moderate-size hiatal hernia. Majority of stomach decompressed. Scattered colonic diverticula without inflammation. Vascular/Lymphatic: Atherosclerotic changes aorta and branch vessels. No aortic aneurysm. No adenopathy. Reproductive: Post hysterectomy.  No worrisome adnexal mass. Other: No free intraperitoneal air or bowel containing hernia. Musculoskeletal: Scoliosis with superimposed prominent degenerative changes without osseous destructive lesion. IMPRESSION: Stent within right renal collecting system. Right uroepithelial tumor appears to have bled which may be obstructing the proximal aspect of the stent which does not appear to be functioning correctly given the degree of right ureteral dilation. Moderate-size hiatal. Scoliosis with superimposed degenerative changes. Aortic atherosclerosis. Electronically Signed   By: Genia Del M.D.   On: 09/17/2016 17:50     Microbiology: Recent Results (from the past 240 hour(s))  Urine culture     Status: Abnormal   Collection Time: 10/11/16  8:00 PM  Result Value Ref Range Status   Specimen Description URINE, RANDOM  Final   Special Requests NONE  Final   Culture (A)  Final    >=100,000 COLONIES/mL ESCHERICHIA COLI Confirmed Extended Spectrum Beta-Lactamase Producer (ESBL)    Report Status 10/16/2016 FINAL  Final   Organism ID, Bacteria ESCHERICHIA COLI (A)  Final      Susceptibility   Escherichia coli - MIC*    AMPICILLIN >=32 RESISTANT Resistant     CEFAZOLIN >=64 RESISTANT Resistant     CEFTRIAXONE >=64 RESISTANT Resistant     CIPROFLOXACIN >=4 RESISTANT Resistant     GENTAMICIN <=1 SENSITIVE Sensitive     IMIPENEM <=0.25 SENSITIVE Sensitive     NITROFURANTOIN <=16 SENSITIVE Sensitive     TRIMETH/SULFA >=320 RESISTANT Resistant     AMPICILLIN/SULBACTAM >=32 RESISTANT Resistant     PIP/TAZO 8 SENSITIVE Sensitive     Extended ESBL POSITIVE Resistant     * >=100,000 COLONIES/mL ESCHERICHIA COLI  Blood culture (routine x 2)     Status: None   Collection Time: 10/11/16  9:43 PM  Result Value Ref Range Status   Specimen Description BLOOD RIGHT ANTECUBITAL  Final   Special Requests  Final    BOTTLES DRAWN AEROBIC AND ANAEROBIC Blood Culture adequate volume   Culture NO GROWTH 5 DAYS  Final   Report Status 10/16/2016 FINAL  Final  Blood culture (routine x 2)     Status: None   Collection Time: 10/11/16  9:45 PM  Result Value Ref Range Status   Specimen Description BLOOD RIGHT HAND  Final   Special Requests   Final    IN PEDIATRIC BOTTLE Blood Culture results may not be optimal due to an excessive volume of blood received in culture bottles   Culture NO GROWTH 5 DAYS  Final   Report Status 10/16/2016 FINAL  Final  Culture, blood (routine x 2)     Status: None (Preliminary result)   Collection Time: 10/14/16  6:58 PM  Result Value Ref Range Status   Specimen Description BLOOD RIGHT ARM   Final   Special Requests IN PEDIATRIC BOTTLE Blood Culture adequate volume  Final   Culture   Final    NO GROWTH 1 DAY Performed at [facility], [place] [address], [place], [place] [address]    Report Status PENDING  Incomplete  Culture, blood (routine x 2)     Status: None (Preliminary result)   Collection Time: 10/14/16  6:58 PM  Result Value Ref Range Status   Specimen Description BLOOD RIGHT HAND  Final   Special Requests IN PEDIATRIC BOTTLE Blood Culture adequate volume  Final   Culture   Final    NO GROWTH 1 DAY Performed at [facility], [place] [address] [place], [place] [address]    Report Status PENDING  Incomplete     Labs: Basic Metabolic Panel:  Recent Labs Lab 10/11/16 2000 10/13/16 1150  NA 134* 135  K 3.6 3.3*  CL 95* 101  CO2 30 26  GLUCOSE 126* 156*  BUN 5* 7  CREATININE 0.98 0.84  CALCIUM 8.9 8.0*   Liver Function Tests:  Recent Labs Lab 10/11/16 2000  AST 20  ALT 8*  ALKPHOS 88  BILITOT 1.0  PROT 6.2*  ALBUMIN 3.0*   No results for input(s): LIPASE, AMYLASE in the last 168 hours. No results for input(s): AMMONIA in the last 168 hours. CBC:  Recent Labs Lab 10/11/16 2000 10/13/16 1150 10/15/16 0448  WBC 12.5* 12.4* 10.5  NEUTROABS 9.0* 8.4*  --   HGB 12.8 11.6* 10.9*  HCT 40.1 36.2 33.4*  MCV 83.4 85.2 82.9  PLT 238 208 163   Cardiac Enzymes: No results for input(s): CKTOTAL, CKMB, CKMBINDEX, TROPONINI in the last 168 hours. BNP: BNP (last 3 results) No results for input(s): BNP in the last 8760 hours.  ProBNP (last 3 results) No results for input(s): PROBNP in the last 8760 hours.  CBG: No results for input(s): GLUCAP in the last 168 hours.     SignedDomenic Polite MD.  [facility] 10/16/2016, 12:58 PM

## 2016-10-16 NOTE — Plan of Care (Signed)
Problem: Health Behavior/Discharge Planning: Goal: Ability to manage health-related needs will improve Outcome: Progressing .  Problem: Physical Regulation: Goal: Ability to maintain clinical measurements within normal limits will improve Outcome: Progressing . Goal: Will remain free from infection Outcome: Progressing .  Problem: Tissue Perfusion: Goal: Risk factors for ineffective tissue perfusion will decrease Outcome: Progressing .  Problem: Fluid Volume: Goal: Ability to maintain a balanced intake and output will improve Outcome: Progressing .  Problem: Bowel/Gastric: Goal: Will not experience complications related to bowel motility Outcome: Progressing Last BM yesterday. Will continue to monitor.   Problem: Urinary Elimination: Goal: Signs and symptoms of infection will decrease Outcome: Progressing .

## 2016-10-16 NOTE — Clinical Social Work Placement (Signed)
Pt discharging to transfer to [facility] room 703. Report [phone]. Pt will transport via [facility] completed medical necessity form and called for transportation. All information provided to facility via the [facility] (facility selected in the [place] as well) Pt reports no family top update- she will inform friends. Discussed once again that insurance coverage will be monitoring pt's progress and may not provide coverage for SNF seeing as pt ambulating 115 ft. Pt agrees to placement still.  See below for placement details  CLINICAL SOCIAL WORK PLACEMENT  NOTE  Date:  10/16/2016  Patient Details  Name: SOLITA MACADAM MRN: [id number] Date of Birth: 1940/05/01  Clinical Social Work is seeking post-discharge placement for this patient at the [place] level of care (*CSW will initial, date and re-position this form in  chart as items are completed):  Yes   Patient/family provided with [facility] Work Department's list of facilities offering this level of care within the geographic area requested by the patient (or if unable, by the patient's family).  Yes   Patient/family informed of their freedom to choose among providers that offer the needed level of care, that participate in Medicare, Medicaid or managed care program needed by the patient, have an available bed and are willing to accept the patient.  Yes   Patient/family informed of [facility]'s ownership interest in [facility] and [facility], as well as of the fact that they are under no obligation to receive care at these facilities.  PASRR submitted to EDS on       PASRR number received on 10/15/16     Existing PASRR number confirmed on       FL2 transmitted to all facilities in geographic area requested by pt/family on 10/15/16     FL2 transmitted to all facilities within larger geographic area on       Patient informed that his/her managed care company has contracts with or  will negotiate with certain facilities, including the following:        Yes   Patient/family informed of bed offers received.  Patient [facility] bed at [facility]     Physician recommends and patient chooses bed at [facility]    Patient to be transferred to [facility] on 10/16/16.  Patient to be transferred to facility by [facility]     Patient family notified on 10/16/16 of transfer.  Name of family member notified:  no family     PHYSICIAN       Additional Comment:    _______________________________________________ Nila Nephew, LCSW 10/16/2016, 1:54 PM

## 2016-10-16 NOTE — Progress Notes (Signed)
Pharmacy Antibiotic Note  Isabel Collins is a 76 y.o. female admitted on 10/11/2016 with abdominal/flank pain as well as nausea and vomiting. She has substantial renal history with recent stent placement in June 2018. Patient started on Zosyn in setting of possible UTI, but due to history of previously grown ESBL ecoli on culture, MD wishes to switch to Meropenem for pyelonephritis.   Plan: Day 5 Meropenem 1g IV q12h. 8/9 Urine cx: same Ecoli ESBL as prev cx 7/16 ID consult for abx recommendations  Height: 5\' 2"  (157.5 cm) Weight: 144 lb 6.4 oz (65.5 kg) IBW/kg (Calculated) : 50.1  Temp (24hrs), Avg:98.3 F (36.8 C), Min:98.2 F (36.8 C), Max:98.4 F (36.9 C)   Recent Labs Lab 10/11/16 2000 10/11/16 2022 10/11/16 2246 10/13/16 1150 10/15/16 0448  WBC 12.5*  --   --  12.4* 10.5  CREATININE 0.98  --   --  0.84  --   LATICACIDVEN  --  2.01* 1.27  --   --     Estimated Creatinine Clearance: 49.8 mL/min (by C-G formula based on SCr of 0.84 mg/dL).    No Known Allergies  Antimicrobials this admission: 8/10 Zosyn >> 8/10 8/10 Meropenem >>  Dose adjustments this admission: n/a  Microbiology results: 8/9 Blood cx: ngtd 8/12 Blood Cx: sent 8/9 UCx: > 100K E.coli, ESBL, same sens as prev cx Previous micro results 7/16 Urine: ESBL E.coli (40,000 colonies) sens only to Gent/Imipenem/Zosyn/NTF  Minda Ditto PharmD Pager [phone] 10/16/2016, 8:49 AM

## 2016-10-19 NOTE — Telephone Encounter (Signed)
Called pt no answer. Voicemail not set up. Trazodone not listed under med list.

## 2016-10-20 LAB — CULTURE, BLOOD (ROUTINE X 2)
CULTURE: NO GROWTH
Culture: NO GROWTH
SPECIAL REQUESTS: ADEQUATE
Special Requests: ADEQUATE

## 2016-10-27 ENCOUNTER — Other Ambulatory Visit: Payer: Self-pay | Admitting: Family Medicine

## 2016-10-29 ENCOUNTER — Emergency Department (HOSPITAL_COMMUNITY): Payer: Medicare Other

## 2016-10-29 ENCOUNTER — Other Ambulatory Visit: Payer: Self-pay

## 2016-10-29 ENCOUNTER — Inpatient Hospital Stay (HOSPITAL_COMMUNITY): Payer: Medicare Other

## 2016-10-29 ENCOUNTER — Inpatient Hospital Stay (HOSPITAL_COMMUNITY)
Admission: EM | Admit: 2016-10-29 | Discharge: 2016-12-03 | DRG: 853 | Disposition: E | Payer: Medicare Other | Attending: Internal Medicine | Admitting: Internal Medicine

## 2016-10-29 ENCOUNTER — Encounter (HOSPITAL_COMMUNITY): Payer: Self-pay

## 2016-10-29 DIAGNOSIS — J449 Chronic obstructive pulmonary disease, unspecified: Secondary | ICD-10-CM | POA: Diagnosis present

## 2016-10-29 DIAGNOSIS — E11649 Type 2 diabetes mellitus with hypoglycemia without coma: Secondary | ICD-10-CM | POA: Diagnosis present

## 2016-10-29 DIAGNOSIS — Z87891 Personal history of nicotine dependence: Secondary | ICD-10-CM

## 2016-10-29 DIAGNOSIS — R0603 Acute respiratory distress: Secondary | ICD-10-CM | POA: Diagnosis not present

## 2016-10-29 DIAGNOSIS — Z79890 Hormone replacement therapy (postmenopausal): Secondary | ICD-10-CM

## 2016-10-29 DIAGNOSIS — N19 Unspecified kidney failure: Secondary | ICD-10-CM

## 2016-10-29 DIAGNOSIS — B3749 Other urogenital candidiasis: Secondary | ICD-10-CM | POA: Diagnosis present

## 2016-10-29 DIAGNOSIS — G9341 Metabolic encephalopathy: Secondary | ICD-10-CM | POA: Diagnosis present

## 2016-10-29 DIAGNOSIS — K219 Gastro-esophageal reflux disease without esophagitis: Secondary | ICD-10-CM | POA: Diagnosis present

## 2016-10-29 DIAGNOSIS — I10 Essential (primary) hypertension: Secondary | ICD-10-CM | POA: Diagnosis present

## 2016-10-29 DIAGNOSIS — N2889 Other specified disorders of kidney and ureter: Secondary | ICD-10-CM | POA: Diagnosis not present

## 2016-10-29 DIAGNOSIS — W19XXXA Unspecified fall, initial encounter: Secondary | ICD-10-CM

## 2016-10-29 DIAGNOSIS — Z23 Encounter for immunization: Secondary | ICD-10-CM | POA: Diagnosis not present

## 2016-10-29 DIAGNOSIS — R627 Adult failure to thrive: Secondary | ICD-10-CM | POA: Diagnosis present

## 2016-10-29 DIAGNOSIS — J9601 Acute respiratory failure with hypoxia: Secondary | ICD-10-CM | POA: Diagnosis not present

## 2016-10-29 DIAGNOSIS — R630 Anorexia: Secondary | ICD-10-CM

## 2016-10-29 DIAGNOSIS — E8809 Other disorders of plasma-protein metabolism, not elsewhere classified: Secondary | ICD-10-CM | POA: Diagnosis present

## 2016-10-29 DIAGNOSIS — N182 Chronic kidney disease, stage 2 (mild): Secondary | ICD-10-CM | POA: Diagnosis present

## 2016-10-29 DIAGNOSIS — C689 Malignant neoplasm of urinary organ, unspecified: Secondary | ICD-10-CM

## 2016-10-29 DIAGNOSIS — S72452A Displaced supracondylar fracture without intracondylar extension of lower end of left femur, initial encounter for closed fracture: Secondary | ICD-10-CM | POA: Diagnosis present

## 2016-10-29 DIAGNOSIS — J440 Chronic obstructive pulmonary disease with acute lower respiratory infection: Secondary | ICD-10-CM | POA: Diagnosis present

## 2016-10-29 DIAGNOSIS — E119 Type 2 diabetes mellitus without complications: Secondary | ICD-10-CM | POA: Diagnosis not present

## 2016-10-29 DIAGNOSIS — C7919 Secondary malignant neoplasm of other urinary organs: Secondary | ICD-10-CM | POA: Diagnosis present

## 2016-10-29 DIAGNOSIS — N136 Pyonephrosis: Secondary | ICD-10-CM | POA: Diagnosis present

## 2016-10-29 DIAGNOSIS — S72002A Fracture of unspecified part of neck of left femur, initial encounter for closed fracture: Secondary | ICD-10-CM | POA: Diagnosis not present

## 2016-10-29 DIAGNOSIS — E8770 Fluid overload, unspecified: Secondary | ICD-10-CM | POA: Diagnosis not present

## 2016-10-29 DIAGNOSIS — R0902 Hypoxemia: Secondary | ICD-10-CM

## 2016-10-29 DIAGNOSIS — R6521 Severe sepsis with septic shock: Secondary | ICD-10-CM | POA: Diagnosis not present

## 2016-10-29 DIAGNOSIS — Z1612 Extended spectrum beta lactamase (ESBL) resistance: Secondary | ICD-10-CM

## 2016-10-29 DIAGNOSIS — M479 Spondylosis, unspecified: Secondary | ICD-10-CM | POA: Diagnosis present

## 2016-10-29 DIAGNOSIS — Z515 Encounter for palliative care: Secondary | ICD-10-CM | POA: Diagnosis not present

## 2016-10-29 DIAGNOSIS — E870 Hyperosmolality and hypernatremia: Secondary | ICD-10-CM | POA: Diagnosis not present

## 2016-10-29 DIAGNOSIS — D72829 Elevated white blood cell count, unspecified: Secondary | ICD-10-CM | POA: Diagnosis not present

## 2016-10-29 DIAGNOSIS — E871 Hypo-osmolality and hyponatremia: Secondary | ICD-10-CM | POA: Diagnosis present

## 2016-10-29 DIAGNOSIS — N139 Obstructive and reflux uropathy, unspecified: Secondary | ICD-10-CM | POA: Diagnosis not present

## 2016-10-29 DIAGNOSIS — E669 Obesity, unspecified: Secondary | ICD-10-CM | POA: Diagnosis present

## 2016-10-29 DIAGNOSIS — E118 Type 2 diabetes mellitus with unspecified complications: Secondary | ICD-10-CM

## 2016-10-29 DIAGNOSIS — C651 Malignant neoplasm of right renal pelvis: Secondary | ICD-10-CM | POA: Diagnosis present

## 2016-10-29 DIAGNOSIS — I5032 Chronic diastolic (congestive) heart failure: Secondary | ICD-10-CM | POA: Diagnosis present

## 2016-10-29 DIAGNOSIS — E039 Hypothyroidism, unspecified: Secondary | ICD-10-CM | POA: Diagnosis present

## 2016-10-29 DIAGNOSIS — R935 Abnormal findings on diagnostic imaging of other abdominal regions, including retroperitoneum: Secondary | ICD-10-CM | POA: Diagnosis not present

## 2016-10-29 DIAGNOSIS — Z96652 Presence of left artificial knee joint: Secondary | ICD-10-CM | POA: Diagnosis present

## 2016-10-29 DIAGNOSIS — K589 Irritable bowel syndrome without diarrhea: Secondary | ICD-10-CM | POA: Diagnosis present

## 2016-10-29 DIAGNOSIS — Z1624 Resistance to multiple antibiotics: Secondary | ICD-10-CM | POA: Diagnosis present

## 2016-10-29 DIAGNOSIS — R7881 Bacteremia: Secondary | ICD-10-CM

## 2016-10-29 DIAGNOSIS — C786 Secondary malignant neoplasm of retroperitoneum and peritoneum: Secondary | ICD-10-CM | POA: Diagnosis present

## 2016-10-29 DIAGNOSIS — J90 Pleural effusion, not elsewhere classified: Secondary | ICD-10-CM | POA: Diagnosis not present

## 2016-10-29 DIAGNOSIS — D696 Thrombocytopenia, unspecified: Secondary | ICD-10-CM | POA: Diagnosis present

## 2016-10-29 DIAGNOSIS — N1330 Unspecified hydronephrosis: Secondary | ICD-10-CM

## 2016-10-29 DIAGNOSIS — S72402A Unspecified fracture of lower end of left femur, initial encounter for closed fracture: Secondary | ICD-10-CM

## 2016-10-29 DIAGNOSIS — E43 Unspecified severe protein-calorie malnutrition: Secondary | ICD-10-CM | POA: Diagnosis not present

## 2016-10-29 DIAGNOSIS — E785 Hyperlipidemia, unspecified: Secondary | ICD-10-CM | POA: Diagnosis present

## 2016-10-29 DIAGNOSIS — Z419 Encounter for procedure for purposes other than remedying health state, unspecified: Secondary | ICD-10-CM

## 2016-10-29 DIAGNOSIS — N179 Acute kidney failure, unspecified: Secondary | ICD-10-CM | POA: Diagnosis present

## 2016-10-29 DIAGNOSIS — W1830XA Fall on same level, unspecified, initial encounter: Secondary | ICD-10-CM | POA: Diagnosis present

## 2016-10-29 DIAGNOSIS — Z79899 Other long term (current) drug therapy: Secondary | ICD-10-CM

## 2016-10-29 DIAGNOSIS — C641 Malignant neoplasm of right kidney, except renal pelvis: Secondary | ICD-10-CM | POA: Diagnosis not present

## 2016-10-29 DIAGNOSIS — A419 Sepsis, unspecified organism: Secondary | ICD-10-CM | POA: Diagnosis not present

## 2016-10-29 DIAGNOSIS — R34 Anuria and oliguria: Secondary | ICD-10-CM | POA: Diagnosis not present

## 2016-10-29 DIAGNOSIS — F419 Anxiety disorder, unspecified: Secondary | ICD-10-CM | POA: Diagnosis present

## 2016-10-29 DIAGNOSIS — E860 Dehydration: Secondary | ICD-10-CM | POA: Diagnosis present

## 2016-10-29 DIAGNOSIS — I130 Hypertensive heart and chronic kidney disease with heart failure and stage 1 through stage 4 chronic kidney disease, or unspecified chronic kidney disease: Secondary | ICD-10-CM | POA: Diagnosis present

## 2016-10-29 DIAGNOSIS — Z9181 History of falling: Secondary | ICD-10-CM

## 2016-10-29 DIAGNOSIS — Z833 Family history of diabetes mellitus: Secondary | ICD-10-CM

## 2016-10-29 DIAGNOSIS — N390 Urinary tract infection, site not specified: Secondary | ICD-10-CM | POA: Diagnosis not present

## 2016-10-29 DIAGNOSIS — T84019A Broken internal joint prosthesis, unspecified site, initial encounter: Secondary | ICD-10-CM

## 2016-10-29 DIAGNOSIS — A4151 Sepsis due to Escherichia coli [E. coli]: Secondary | ICD-10-CM

## 2016-10-29 DIAGNOSIS — J9690 Respiratory failure, unspecified, unspecified whether with hypoxia or hypercapnia: Secondary | ICD-10-CM

## 2016-10-29 DIAGNOSIS — Z9049 Acquired absence of other specified parts of digestive tract: Secondary | ICD-10-CM

## 2016-10-29 DIAGNOSIS — Y92129 Unspecified place in nursing home as the place of occurrence of the external cause: Secondary | ICD-10-CM | POA: Diagnosis not present

## 2016-10-29 DIAGNOSIS — B9629 Other Escherichia coli [E. coli] as the cause of diseases classified elsewhere: Secondary | ICD-10-CM | POA: Diagnosis not present

## 2016-10-29 DIAGNOSIS — S72452K Displaced supracondylar fracture without intracondylar extension of lower end of left femur, subsequent encounter for closed fracture with nonunion: Secondary | ICD-10-CM | POA: Diagnosis not present

## 2016-10-29 DIAGNOSIS — J9811 Atelectasis: Secondary | ICD-10-CM | POA: Diagnosis not present

## 2016-10-29 DIAGNOSIS — W19XXXD Unspecified fall, subsequent encounter: Secondary | ICD-10-CM | POA: Diagnosis not present

## 2016-10-29 DIAGNOSIS — Z6837 Body mass index (BMI) 37.0-37.9, adult: Secondary | ICD-10-CM

## 2016-10-29 DIAGNOSIS — E872 Acidosis: Secondary | ICD-10-CM | POA: Diagnosis present

## 2016-10-29 DIAGNOSIS — C7911 Secondary malignant neoplasm of bladder: Secondary | ICD-10-CM | POA: Diagnosis not present

## 2016-10-29 DIAGNOSIS — E869 Volume depletion, unspecified: Secondary | ICD-10-CM | POA: Diagnosis not present

## 2016-10-29 DIAGNOSIS — E1122 Type 2 diabetes mellitus with diabetic chronic kidney disease: Secondary | ICD-10-CM | POA: Diagnosis present

## 2016-10-29 DIAGNOSIS — Z66 Do not resuscitate: Secondary | ICD-10-CM | POA: Diagnosis not present

## 2016-10-29 DIAGNOSIS — R5381 Other malaise: Secondary | ICD-10-CM | POA: Diagnosis present

## 2016-10-29 DIAGNOSIS — D631 Anemia in chronic kidney disease: Secondary | ICD-10-CM | POA: Diagnosis present

## 2016-10-29 DIAGNOSIS — J189 Pneumonia, unspecified organism: Secondary | ICD-10-CM | POA: Diagnosis present

## 2016-10-29 DIAGNOSIS — G9340 Encephalopathy, unspecified: Secondary | ICD-10-CM | POA: Diagnosis not present

## 2016-10-29 DIAGNOSIS — Z96611 Presence of right artificial shoulder joint: Secondary | ICD-10-CM | POA: Diagnosis present

## 2016-10-29 DIAGNOSIS — Z86010 Personal history of colonic polyps: Secondary | ICD-10-CM

## 2016-10-29 DIAGNOSIS — A499 Bacterial infection, unspecified: Secondary | ICD-10-CM | POA: Diagnosis not present

## 2016-10-29 DIAGNOSIS — R0602 Shortness of breath: Secondary | ICD-10-CM

## 2016-10-29 DIAGNOSIS — S72452S Displaced supracondylar fracture without intracondylar extension of lower end of left femur, sequela: Secondary | ICD-10-CM | POA: Diagnosis not present

## 2016-10-29 DIAGNOSIS — Y95 Nosocomial condition: Secondary | ICD-10-CM | POA: Diagnosis present

## 2016-10-29 DIAGNOSIS — G894 Chronic pain syndrome: Secondary | ICD-10-CM | POA: Diagnosis present

## 2016-10-29 DIAGNOSIS — M25562 Pain in left knee: Secondary | ICD-10-CM | POA: Diagnosis present

## 2016-10-29 DIAGNOSIS — R52 Pain, unspecified: Secondary | ICD-10-CM

## 2016-10-29 DIAGNOSIS — Z8249 Family history of ischemic heart disease and other diseases of the circulatory system: Secondary | ICD-10-CM

## 2016-10-29 LAB — I-STAT CG4 LACTIC ACID, ED: LACTIC ACID, VENOUS: 1.34 mmol/L (ref 0.5–1.9)

## 2016-10-29 LAB — URINALYSIS, ROUTINE W REFLEX MICROSCOPIC
GLUCOSE, UA: NEGATIVE mg/dL
Nitrite: POSITIVE — AB
Specific Gravity, Urine: 1.025 (ref 1.005–1.030)
pH: 5.5 (ref 5.0–8.0)

## 2016-10-29 LAB — CBC WITH DIFFERENTIAL/PLATELET
Basophils Absolute: 0 10*3/uL (ref 0.0–0.1)
Basophils Relative: 0 %
Eosinophils Absolute: 0.1 10*3/uL (ref 0.0–0.7)
Eosinophils Relative: 0 %
HCT: 30.1 % — ABNORMAL LOW (ref 36.0–46.0)
HEMOGLOBIN: 9.5 g/dL — AB (ref 12.0–15.0)
LYMPHS ABS: 1.2 10*3/uL (ref 0.7–4.0)
LYMPHS PCT: 6 %
MCH: 26.6 pg (ref 26.0–34.0)
MCHC: 31.6 g/dL (ref 30.0–36.0)
MCV: 84.3 fL (ref 78.0–100.0)
Monocytes Absolute: 2.4 10*3/uL — ABNORMAL HIGH (ref 0.1–1.0)
Monocytes Relative: 12 %
NEUTROS ABS: 15.8 10*3/uL — AB (ref 1.7–7.7)
NEUTROS PCT: 82 %
Platelets: 271 10*3/uL (ref 150–400)
RBC: 3.57 MIL/uL — AB (ref 3.87–5.11)
RDW: 16.4 % — ABNORMAL HIGH (ref 11.5–15.5)
WBC: 19.4 10*3/uL — AB (ref 4.0–10.5)

## 2016-10-29 LAB — CBG MONITORING, ED: GLUCOSE-CAPILLARY: 123 mg/dL — AB (ref 65–99)

## 2016-10-29 LAB — COMPREHENSIVE METABOLIC PANEL
ALK PHOS: 115 U/L (ref 38–126)
ALT: 11 U/L — AB (ref 14–54)
AST: 50 U/L — ABNORMAL HIGH (ref 15–41)
Albumin: 2.1 g/dL — ABNORMAL LOW (ref 3.5–5.0)
Anion gap: 10 (ref 5–15)
BUN: 43 mg/dL — AB (ref 6–20)
CALCIUM: 7.9 mg/dL — AB (ref 8.9–10.3)
CO2: 24 mmol/L (ref 22–32)
CREATININE: 2.17 mg/dL — AB (ref 0.44–1.00)
Chloride: 97 mmol/L — ABNORMAL LOW (ref 101–111)
GFR, EST AFRICAN AMERICAN: 24 mL/min — AB (ref >60)
GFR, EST NON AFRICAN AMERICAN: 21 mL/min — AB (ref >60)
Glucose, Bld: 114 mg/dL — ABNORMAL HIGH (ref 65–99)
Potassium: 4.3 mmol/L (ref 3.5–5.1)
SODIUM: 131 mmol/L — AB (ref 135–145)
Total Bilirubin: 1.1 mg/dL (ref 0.3–1.2)
Total Protein: 5.7 g/dL — ABNORMAL LOW (ref 6.5–8.1)

## 2016-10-29 LAB — URINALYSIS, MICROSCOPIC (REFLEX): SQUAMOUS EPITHELIAL / LPF: NONE SEEN

## 2016-10-29 LAB — PROTIME-INR
INR: 1.15
Prothrombin Time: 14.7 seconds (ref 11.4–15.2)

## 2016-10-29 LAB — TYPE AND SCREEN
ABO/RH(D): A POS
Antibody Screen: NEGATIVE

## 2016-10-29 LAB — GLUCOSE, CAPILLARY: GLUCOSE-CAPILLARY: 98 mg/dL (ref 65–99)

## 2016-10-29 LAB — ABO/RH: ABO/RH(D): A POS

## 2016-10-29 MED ORDER — LEVOTHYROXINE SODIUM 75 MCG PO TABS
75.0000 ug | ORAL_TABLET | Freq: Every day | ORAL | Status: DC
  Administered 2016-10-30 – 2016-11-12 (×14): 75 ug via ORAL
  Filled 2016-10-29 (×14): qty 1

## 2016-10-29 MED ORDER — BISACODYL 5 MG PO TBEC
5.0000 mg | DELAYED_RELEASE_TABLET | Freq: Every evening | ORAL | Status: DC | PRN
  Administered 2016-11-01: 5 mg via ORAL
  Filled 2016-10-29: qty 1

## 2016-10-29 MED ORDER — FLUTICASONE PROPIONATE 50 MCG/ACT NA SUSP: 2.0000 | Freq: Every evening | NASAL | Status: DC | PRN

## 2016-10-29 MED ORDER — SODIUM CHLORIDE 0.9 % IV SOLN
1.0000 g | INTRAVENOUS | Status: AC
  Administered 2016-10-29: 1 g via INTRAVENOUS
  Filled 2016-10-29: qty 1

## 2016-10-29 MED ORDER — FLUCONAZOLE 100 MG PO TABS
100.0000 mg | ORAL_TABLET | Freq: Every day | ORAL | Status: DC
  Administered 2016-10-30 – 2016-11-12 (×14): 100 mg via ORAL
  Filled 2016-10-29 (×14): qty 1

## 2016-10-29 MED ORDER — SENNOSIDES-DOCUSATE SODIUM 8.6-50 MG PO TABS
1.0000 | ORAL_TABLET | Freq: Every evening | ORAL | Status: DC | PRN
  Administered 2016-11-01 – 2016-11-03 (×3): 1 via ORAL
  Filled 2016-10-29 (×3): qty 1

## 2016-10-29 MED ORDER — SODIUM CHLORIDE 0.9 % IV SOLN
1.0000 g | Freq: Two times a day (BID) | INTRAVENOUS | Status: DC
  Administered 2016-10-30 – 2016-11-19 (×41): 1 g via INTRAVENOUS
  Filled 2016-10-29 (×42): qty 1

## 2016-10-29 MED ORDER — CYCLOSPORINE 0.05 % OP EMUL
1.0000 [drp] | Freq: Two times a day (BID) | OPHTHALMIC | Status: DC
  Administered 2016-10-29 – 2016-11-24 (×50): 1 [drp] via OPHTHALMIC
  Filled 2016-10-29 (×57): qty 1

## 2016-10-29 MED ORDER — SODIUM CHLORIDE 0.9 % IV BOLUS (SEPSIS)
1000.0000 mL | Freq: Once | INTRAVENOUS | Status: AC
  Administered 2016-10-29: 1000 mL via INTRAVENOUS

## 2016-10-29 MED ORDER — VANCOMYCIN HCL IN DEXTROSE 1-5 GM/200ML-% IV SOLN
1000.0000 mg | Freq: Once | INTRAVENOUS | Status: AC
  Administered 2016-10-29: 1000 mg via INTRAVENOUS
  Filled 2016-10-29: qty 200

## 2016-10-29 MED ORDER — INSULIN ASPART 100 UNIT/ML ~~LOC~~ SOLN
0.0000 [IU] | Freq: Three times a day (TID) | SUBCUTANEOUS | Status: DC
  Administered 2016-11-01 – 2016-11-02 (×4): 2 [IU] via SUBCUTANEOUS
  Administered 2016-11-02 – 2016-11-03 (×3): 1 [IU] via SUBCUTANEOUS
  Administered 2016-11-03: 2 [IU] via SUBCUTANEOUS
  Administered 2016-11-03 – 2016-11-04 (×2): 1 [IU] via SUBCUTANEOUS
  Administered 2016-11-04: 2 [IU] via SUBCUTANEOUS
  Administered 2016-11-04: 1 [IU] via SUBCUTANEOUS
  Administered 2016-11-05: 2 [IU] via SUBCUTANEOUS
  Administered 2016-11-05 – 2016-11-07 (×7): 1 [IU] via SUBCUTANEOUS

## 2016-10-29 MED ORDER — CEFAZOLIN SODIUM-DEXTROSE 2-4 GM/100ML-% IV SOLN
2.0000 g | INTRAVENOUS | Status: DC
  Filled 2016-10-29: qty 100

## 2016-10-29 MED ORDER — MOMETASONE FURO-FORMOTEROL FUM 200-5 MCG/ACT IN AERO
2.0000 | INHALATION_SPRAY | Freq: Two times a day (BID) | RESPIRATORY_TRACT | Status: DC
  Administered 2016-10-30 – 2016-11-12 (×20): 2 via RESPIRATORY_TRACT
  Filled 2016-10-29 (×4): qty 8.8

## 2016-10-29 MED ORDER — ALBUTEROL SULFATE (2.5 MG/3ML) 0.083% IN NEBU: 2.5000 mg | INHALATION_SOLUTION | RESPIRATORY_TRACT | Status: DC | PRN

## 2016-10-29 MED ORDER — PREGABALIN 75 MG PO CAPS
150.0000 mg | ORAL_CAPSULE | Freq: Two times a day (BID) | ORAL | Status: DC
  Administered 2016-10-29 – 2016-11-09 (×20): 150 mg via ORAL
  Filled 2016-10-29 (×24): qty 2

## 2016-10-29 MED ORDER — PIPERACILLIN-TAZOBACTAM 3.375 G IVPB 30 MIN: 3.3750 g | Freq: Once | INTRAVENOUS | Status: DC

## 2016-10-29 MED ORDER — ATORVASTATIN CALCIUM 40 MG PO TABS
40.0000 mg | ORAL_TABLET | Freq: Every day | ORAL | Status: DC
  Administered 2016-10-29 – 2016-11-21 (×21): 40 mg via ORAL
  Filled 2016-10-29 (×22): qty 1

## 2016-10-29 MED ORDER — DULOXETINE HCL 60 MG PO CPEP
60.0000 mg | ORAL_CAPSULE | Freq: Every day | ORAL | Status: DC
  Administered 2016-10-30 – 2016-11-12 (×11): 60 mg via ORAL
  Filled 2016-10-29: qty 2
  Filled 2016-10-29: qty 1
  Filled 2016-10-29 (×3): qty 2
  Filled 2016-10-29: qty 1
  Filled 2016-10-29: qty 2
  Filled 2016-10-29: qty 1
  Filled 2016-10-29 (×3): qty 2
  Filled 2016-10-29: qty 1

## 2016-10-29 MED ORDER — LACTATED RINGERS IV SOLN
INTRAVENOUS | Status: DC
  Administered 2016-11-06: 10:00:00 via INTRAVENOUS

## 2016-10-29 MED ORDER — SODIUM CHLORIDE 0.9 % IV SOLN
INTRAVENOUS | Status: DC
  Administered 2016-10-29 – 2016-10-31 (×4): via INTRAVENOUS

## 2016-10-29 MED ORDER — PANTOPRAZOLE SODIUM 40 MG PO TBEC
40.0000 mg | DELAYED_RELEASE_TABLET | Freq: Every day | ORAL | Status: DC
  Administered 2016-10-30 – 2016-11-12 (×11): 40 mg via ORAL
  Filled 2016-10-29 (×12): qty 1

## 2016-10-29 MED ORDER — OXYCODONE HCL 5 MG PO TABS
5.0000 mg | ORAL_TABLET | ORAL | Status: DC | PRN
  Administered 2016-10-30 – 2016-11-05 (×13): 5 mg via ORAL
  Filled 2016-10-29 (×13): qty 1

## 2016-10-29 MED ORDER — DULOXETINE HCL 30 MG PO CPEP
30.0000 mg | ORAL_CAPSULE | Freq: Every day | ORAL | Status: DC
  Administered 2016-10-29 – 2016-11-11 (×11): 30 mg via ORAL
  Filled 2016-10-29 (×11): qty 1

## 2016-10-29 MED ORDER — CHLORHEXIDINE GLUCONATE 4 % EX LIQD
60.0000 mL | Freq: Once | CUTANEOUS | Status: AC
  Administered 2016-10-30: 4 via TOPICAL
  Filled 2016-10-29 (×2): qty 60

## 2016-10-29 MED ORDER — MORPHINE SULFATE (PF) 4 MG/ML IV SOLN
4.0000 mg | Freq: Once | INTRAVENOUS | Status: AC
  Administered 2016-10-29: 4 mg via INTRAVENOUS
  Filled 2016-10-29: qty 1

## 2016-10-29 MED ORDER — ACETAMINOPHEN 500 MG PO TABS
1000.0000 mg | ORAL_TABLET | Freq: Once | ORAL | Status: AC
  Administered 2016-10-29: 1000 mg via ORAL
  Filled 2016-10-29: qty 2

## 2016-10-29 MED ORDER — LACTATED RINGERS IV BOLUS (SEPSIS)
500.0000 mL | Freq: Once | INTRAVENOUS | Status: AC
  Administered 2016-10-29: 500 mL via INTRAVENOUS

## 2016-10-29 NOTE — ED Notes (Signed)
Bed: WA02 Expected date:  Expected time:  Means of arrival:  Comments: EMS-left knee fracture

## 2016-10-29 NOTE — ED Provider Notes (Signed)
[facility] DEPT Provider Note   CSN: [id number] Arrival date & time: 10/30/2016  1224     History   Chief Complaint Chief Complaint  Patient presents with  . Fall  . Leg Injury    HPI Isabel Collins is a 76 y.o. female history of COPD, hypertension, hypothyroidism, here presenting with fall, possible left knee fracture. Patient had previous knee replacement done in [place]. Patient is currently at a rehabilitation facility and was recently diagnosed with new renal mass and pyelonephritis and is still currently on IV antibiotics through a PICC line. Patient states that yesterday somebody was helping her to the commode and they both fell and she let on her left knee. She had x-ray there that showed a fracture above the left knee prosthesis though she was sent here for evaluation. Patient denies any head injury at the time.   The history is provided by the patient.    Past Medical History:  Diagnosis Date  . Anemia   . Anxiety   . Chronic constipation   . COPD (chronic obstructive pulmonary disease) ([place])   . Degenerative arthritis of spine   . Diverticulosis of colon   . Fibromyalgia   . GERD (gastroesophageal reflux disease)   . Gross hematuria   . Hiatal hernia   . History of chronic bronchitis   . History of colon polyps   . Hyperlipidemia   . Hypertension   . Hypothyroidism   . IBS (irritable bowel syndrome)   . OA (osteoarthritis)    hips  . Renal mass, right     Patient Active Problem List   Diagnosis Date Noted  . Chronic constipation 10/12/2016  . Acute pyelonephritis 10/11/2016  . Right renal mass 10/08/2016  . Right flank pain, chronic 10/08/2016  . Chronic pain syndrome 10/08/2016  . History of recent fall 10/05/2016  . Physical deconditioning 10/01/2016  . Hypothyroidism 10/01/2016  . Chronic obstructive pulmonary disease ([place]) 10/01/2016  . Type 2 diabetes mellitus with complication, without long-term current use of insulin ([place]) 10/01/2016  .  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 Gastroesophageal reflux disease 10/01/2016  . Dyspnea on exertion 06/25/2016  . Fibromyalgia 05/23/2016  . 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 Hypertension 02/10/2016  . High cholesterol 02/10/2016  . Hiatal hernia 02/10/2016    Past Surgical History:  Procedure Laterality Date  . arm surgery Left   . BREAST REDUCTION SURGERY Bilateral   . CHOLECYSTECTOMY    . CYSTOSCOPY WITH BIOPSY Right 08/27/2016   Procedure: CYSTOSCOPY WITH BIOPSY;  Surgeon: Kathie Rhodes, MD;  Location: [facility];  Service: Urology;  Laterality: Right;  . CYSTOSCOPY WITH RETROGRADE PYELOGRAM, URETEROSCOPY AND STENT PLACEMENT Right 08/27/2016   Procedure: CYSTOSCOPY WITH RIGHT RETROGRADE PYELOGRAM, RIGHT URETEROSCOPY AND STENT PLACEMENT;  Surgeon: Kathie Rhodes, MD;  Location[facility];  Service: Urology;  Laterality: Right;  . REPLACEMENT TOTAL KNEE Left   . TOTAL SHOULDER REPLACEMENT Right   . TUBAL LIGATION      OB History    No data available       Home Medications    Prior to Admission medications   Medication Sig Start Date End Date Taking? Authorizing Provider  ADVAIR DISKUS 250-50 MCG/DOSE AEPB Inhale 1 puff into the lungs 2 (two) times daily as needed (shortness of breath).  06/06/16  Yes [provider]  albuterol (PROVENTIL HFA;VENTOLIN HFA) 108 (90 Base) MCG/ACT  inhaler Inhale 1-2 puffs into the lungs every 6 (six) hours as needed for wheezing. 05/20/16  Yes Larene Pickett, PA-C  atorvastatin (LIPITOR) 40 MG tablet Take 1 tablet (40 mg total) by mouth every evening. Patient taking differently: Take 40 mg by mouth at bedtime.  09/08/16  Yes [name], MD  bisacodyl (DULCOLAX) 5 MG EC tablet Take 5 mg by mouth at bedtime as needed for moderate constipation.    Yes [provider]  Cholecalciferol (VITAMIN D3)  400 units CAPS Take 400 Units by mouth daily.   Yes [provider]  cycloSPORINE (RESTASIS) 0.05 % ophthalmic emulsion Place 1 drop into both eyes 2 (two) times daily. 09/19/16  Yes [name], MD  DULoxetine (CYMBALTA) 30 MG capsule Take 1 capsule (30 mg total) by mouth 2 (two) times daily. Take two capsules po in the morning and one capsule po in the afternoon. 10/16/16  Yes Domenic Polite, MD  fluconazole (DIFLUCAN) 100 MG tablet Take 100 mg by mouth daily.   Yes [provider]  fluticasone (FLONASE) 50 MCG/ACT nasal spray Place 2 sprays into both nostrils at bedtime as needed for allergies or rhinitis. 09/19/16  Yes [name], MD  HYDROcodone-acetaminophen (NORCO/VICODIN) 5-325 MG tablet Take 1 tablet by mouth every 6 (six) hours as needed for moderate pain. 10/16/16  Yes Domenic Polite, MD  levothyroxine (SYNTHROID, LEVOTHROID) 75 MCG tablet Take 1 tablet (75 mcg total) by mouth daily before breakfast. 09/08/16  Yes [name], MD  metoprolol succinate (TOPROL-XL) 100 MG 24 hr tablet TAKE 1 TABLET BY MOUTH  DAILY WITH OR IMMEDIATLEY  FOLLOWING A MEAL 10/28/16  Yes [name], MD  NEXIUM 40 MG capsule Take 1 capsule (40 mg total) by mouth daily. 10/08/16  Yes Sagardia, Ines Bloomer, MD  nitrofurantoin (MACRODANTIN) 50 MG capsule Take 50 mg by mouth at bedtime.   Yes [provider]  oxycodone (OXY-IR) 5 MG capsule Take 5 mg by mouth every 4 (four) hours as needed for pain.   Yes [provider]  polyethylene glycol (MIRALAX / GLYCOLAX) packet Take 17 g by mouth daily as needed for mild constipation. 10/16/16  Yes Domenic Polite, MD  pregabalin (LYRICA) 150 MG capsule Take 1 capsule (150 mg total) by mouth 2 (two) times daily. 09/08/16  Yes [name], MD  Tetrahydroz-Dextran-PEG-Povid [facility] ADVANCED RELIEF) 0.05-0.1-1-1 % SOLN Apply 1 drop to eye every 4 (four) hours.   Yes [provider]  nitrofurantoin, macrocrystal-monohydrate, (MACROBID) 100 MG  capsule Take 1 capsule (100 mg total) by mouth 2 (two) times daily. For 2 weeks, until Kidney surgery Patient not taking: Reported on 11/01/2016 10/16/16   Domenic Polite, MD  senna-docusate (SENOKOT-S) 8.6-50 MG tablet Take 1 tablet by mouth 2 (two) times daily. Patient not taking: Reported on 10/06/2016 10/16/16   Domenic Polite, MD    Family History Family History  Problem Relation Age of Onset  . Diabetes Mother   . Heart disease Mother   . Diabetes Father   . Heart disease Father   . Diabetes Sister   . Diabetes Brother        x3  . Colon cancer Neg Hx   . Esophageal cancer Neg Hx   . Stomach cancer Neg Hx   . Pancreatic cancer Neg Hx     Social History Social History  Substance Use Topics  . Smoking status: Former Smoker    Packs/day: 0.50    Years: 46.00  Types: Cigarettes    Quit date: 03/05/2004  . Smokeless tobacco: Never Used  . Alcohol use No     Allergies   Patient has no known allergies.   Review of Systems Review of Systems  Musculoskeletal:       + L knee pain   All other systems reviewed and are negative.    Physical Exam Updated Vital Signs BP 106/78   Pulse 97   Temp 97.9 F (36.6 C) (Axillary)   Resp 15   Wt 65.3 kg (144 lb)   SpO2 99%   BMI 26.34 kg/m   Physical Exam  Constitutional: She is oriented to person, place, and time.  Uncomfortable, chronically ill, dehydrated   HENT:  Head: Normocephalic.  MM slightly dry   Eyes: Pupils are equal, round, and reactive to light. Conjunctivae and EOM are normal.  Neck: Normal range of motion. Neck supple.  Cardiovascular: Normal rate, regular rhythm and normal heart sounds.   Pulmonary/Chest: Effort normal and breath sounds normal. No respiratory distress. She has no wheezes. She has no rales.  Abdominal: Soft. Bowel sounds are normal. She exhibits no distension. There is no tenderness. There is no guarding.  Nephrostomy tube removed   Musculoskeletal:  L knee replacement scar healing  well. + L knee effusion, unable to range it. No obvious proximal femur or hip tenderness. 2+ distal pulses, able to wiggle toes.   Neurological: She is alert and oriented to person, place, and time.  Skin: Skin is warm.  Psychiatric: She has a normal mood and affect.  Nursing note and vitals reviewed.    ED Treatments / Results  Labs (all labs ordered are listed, but only abnormal results are displayed) Labs Reviewed  CBC WITH DIFFERENTIAL/PLATELET - Abnormal; Notable for the following:       Result Value   WBC 19.4 (*)    RBC 3.57 (*)    Hemoglobin 9.5 (*)    HCT 30.1 (*)    RDW 16.4 (*)    Neutro Abs 15.8 (*)    Monocytes Absolute 2.4 (*)    All other components within normal limits  COMPREHENSIVE METABOLIC PANEL - Abnormal; Notable for the following:    Sodium 131 (*)    Chloride 97 (*)    Glucose, Bld 114 (*)    BUN 43 (*)    Creatinine, Ser 2.17 (*)    Calcium 7.9 (*)    Total Protein 5.7 (*)    Albumin 2.1 (*)    AST 50 (*)    ALT 11 (*)    GFR calc non Af Amer 21 (*)    GFR calc Af Amer 24 (*)    All other components within normal limits  CBG MONITORING, ED - Abnormal; Notable for the following:    Glucose-Capillary 123 (*)    All other components within normal limits  CULTURE, BLOOD (ROUTINE X 2)  CULTURE, BLOOD (ROUTINE X 2)  URINE CULTURE  PROTIME-INR  URINALYSIS, ROUTINE W REFLEX MICROSCOPIC  I-STAT CG4 LACTIC ACID, ED  I-STAT CG4 LACTIC ACID, ED  TYPE AND SCREEN  ABO/RH    EKG  EKG Interpretation None       Radiology Dg Chest 1 View  Result Date: 10/05/2016 CLINICAL DATA:  [facility] at nursing facility.  Follow-up femur fracture. EXAM: CHEST 1 VIEW COMPARISON:  Chest radiograph May 20, 2016 FINDINGS: Cardiac silhouette is normal. New fullness of the hila with hazy density RIGHT lung. Reticulonodular densities bilaterally. RIGHT midlung zone granuloma  unchanged. No pneumothorax. Status post RIGHT shoulder arthroplasty. IMPRESSION: New hilar fullness  seen with vascular congestion and lymphadenopathy. New reticulonodular densities and hazy RIGHT lung which may be artifact or layering pleural effusion. Recommend follow-up PA and lateral views the chest when clinically able. Electronically Signed   By: Elon Alas M.D.   On: 10/18/2016 14:07   Dg Pelvis 1-2 Views  Result Date: 10/16/2016 CLINICAL DATA:  Fall. EXAM: PELVIS - 1-2 VIEW COMPARISON:  CT abdomen pelvis dated September 17, 2016. FINDINGS: There is no evidence of pelvic fracture or diastasis. No pelvic bone lesions are seen. Partially visualized right ureteral stent. Degenerative changes of the lower lumbar spine. IMPRESSION: No definite fracture. If occult hip fracture is suspected or if the patient is unable to bear weight, MRI is the preferred modality for further evaluation. Electronically Signed   By: Titus Dubin M.D.   On: 10/06/2016 14:07   Dg Knee 1-2 Views Left  Result Date: 10/16/2016 CLINICAL DATA:  [facility] at nursing facility.  Follow-up femur fracture. EXAM: LEFT KNEE - 1-2 VIEW COMPARISON:  LEFT femur radiograph October 29, 2016 at 1331 hours FINDINGS: Acute oblique fracture through distal femur involving the superior aspect of the femur hardware, status post total knee arthroplasty. Slight posterior angulation distal bony fragments. No dislocation. Hardware is intact. Expected appearance of the resurfaced patella. Osteopenia without destructive bony lesions. Suppurative patella soft tissue swelling and suspected lipoarthrosis. IMPRESSION: Acute displaced distal femur fracture involving superior aspect of femur hardware, status post total knee arthroplasty. No dislocation. Electronically Signed   By: Elon Alas M.D.   On: 11/02/2016 14:05   Dg Femur Min 2 Views Left  Result Date: 10/17/2016 CLINICAL DATA:  Status post fall. Distal femur fracture seen on mobile x-ray. EXAM: LEFT FEMUR 2 VIEWS COMPARISON:  No recent studies in [facility] FINDINGS: The patient has sustained an  acute angulated fracture through the metadiaphysis of the distal femur. The fracture line lies adjacent to the superior aspect of the anterior portion of the femoral component of the knee prosthesis. More proximally the shaft of the femur is intact. The femoral head, neck, and intertrochanteric region are normal where visualized. IMPRESSION: There is an acute angulated femoral fracture just proximal to the femoral component of the left knee prosthesis. Electronically Signed   By: Franky Reier  [place] M.D.   On: 10/14/2016 14:00    Procedures Procedures (including critical care time)  Medications Ordered in ED Medications  morphine 4 MG/ML injection 4 mg (not administered)  vancomycin (VANCOCIN) IVPB 1000 mg/200 mL premix (not administered)  sodium chloride 0.9 % bolus 1,000 mL (0 mLs Intravenous Stopped 10/03/2016 1545)     Initial Impression / Assessment and Plan / ED Course  I have reviewed the triage vital signs and the nursing notes.  Pertinent labs & imaging results that were available during my care of the patient were reviewed by me and considered in my medical decision making (see chart for details).    EMILIYA CHRETIEN is a 76 y.o. female here with fall with periprosthetic fracture. Borderline hypotensive in the ED, already on IV abx through PICC line at the rehab facility. Will do sepsis workup. Will repeat xrays and likely consult surgery and admit to medicine service.   2:30 pm Xray showed distal femur fracture. I called Dr. [name] who recommend hospitalist admission, transfer to [facility], NPO after midnight and he will do surgery tomorrow.   3:47 PM CXR showed possible pneumonia. Has WBC 19, acute renal  failure. Given vanc/meropenem (hx of ESBL). Hospitalist to admit.   Final Clinical Impressions(s) / ED Diagnoses   Final diagnoses:  Fall  Pain    New Prescriptions New Prescriptions   No medications on file     Drenda Freeze, MD 10/26/2016 [phone]

## 2016-10-29 NOTE — ED Notes (Signed)
Dr Darl Householder verbally ordered this nurse to use patient's PICC line for blood draw and ordered medications.

## 2016-10-29 NOTE — ED Notes (Signed)
Pt was asked at this time if hurting, pt responded "yes all over". Pt was asked at this time if they wanted an ice pack, "no I will hurt worse"

## 2016-10-29 NOTE — ED Triage Notes (Signed)
Patient BIB EMS from [facility] with complaints of Left knee pain. Patient fell yesterday and hit her knee. Patient reports "they got me back up and I went on with my day, but today it still hurt." Per report, mobile xray unit got images of the left knee today and showed distal metaphysis femur fracture. Patient has history of 2 surgeries to the same Left knee. Patient has PICC line to right UA in triage.

## 2016-10-29 NOTE — ED Notes (Signed)
Patient transported to x-ray. ?

## 2016-10-29 NOTE — ED Notes (Signed)
Report called to [facility] at 1726. ETA 44min.

## 2016-10-29 NOTE — ED Notes (Signed)
Pt's CBG=123

## 2016-10-29 NOTE — H&P (Signed)
History and Physical    Isabel Collins WVP:[id number] DOB: 01/08/1941 DOA: 10/05/2016  I have briefly reviewed the patient's prior medical records in [place]  PCP: [name], MD  Patient coming from: SNF  Chief Complaint: Left knee pain  HPI: Isabel Collins is a 76 y.o. female with medical history significant of COPD, diabetes mellitus, hypertension, hyperlipidemia, recent diagnosis of right upper pole transitional cell carcinoma, seen recently by urology with recent ureteroscopy/biopsy and stent, supposed to have nephrectomy 3 days from now, presents to the emergency room from her skilled nursing home after having a fall and injuring her left knee.  Patient tells me that yesterday she was assisted to and from the bedside commode, and fell to the ground.  She denies any syncope or passing out episode.  She has been complaining of persistent left knee pain and decided to come to the emergency room today.  She has a history of left knee replacement on about 10 years ago outside [place].  She was recently hospitalized and discharged about 13 days ago, and at that time she was diagnosed with an ESBL UTI and on discharge she had a PICC line and was on meropenem for a few more days.  Meropenem appears to have been finished and she is now on nitrofurantoin.  She still has the PICC line in place.  Patient denies any chest pain, denies any palpitations, she has no abdominal pain, denies any nausea or vomiting.  She denies any dysuria.  She denies cough / sputum production / congestion.  She appears a bit sleepy in the ED. patient also states that she has been eating and drinking much because of the pain.  ED Course: In the emergency room she is afebrile, her blood pressure was initially in the 54O systolic however improved into the 100s with fluids, she is satting well on 2 L nasal cannula, her blood work reveals acute kidney injury with a creatinine of 2.1 from prior normal values, and a  leukocytosis of 19.  Left knee x-ray showed an acute displaced distal femur fracture on the superior aspect of femur hardware without dislocation.  ED physician discussed with orthopedic surgery and recommended surgical repair.  Will transfer patient to [facility] for OR time over there.  Review of Systems: As per HPI otherwise 10 point review of systems negative.   Past Medical History:  Diagnosis Date  . Anemia   . Anxiety   . Chronic constipation   . COPD (chronic obstructive pulmonary disease) ([place])   . Degenerative arthritis of spine   . Diverticulosis of colon   . Fibromyalgia   . GERD (gastroesophageal reflux disease)   . Gross hematuria   . Hiatal hernia   . History of chronic bronchitis   . History of colon polyps   . Hyperlipidemia   . Hypertension   . Hypothyroidism   . IBS (irritable bowel syndrome)   . OA (osteoarthritis)    hips  . Renal mass, right     Past Surgical History:  Procedure Laterality Date  . arm surgery Left   . BREAST REDUCTION SURGERY Bilateral   . CHOLECYSTECTOMY    . CYSTOSCOPY WITH BIOPSY Right 08/27/2016   Procedure: CYSTOSCOPY WITH BIOPSY;  Surgeon: Kathie Rhodes, MD;  Location: [facility];  Service: Urology;  Laterality: Right;  . CYSTOSCOPY WITH RETROGRADE PYELOGRAM, URETEROSCOPY AND STENT PLACEMENT Right 08/27/2016   Procedure: CYSTOSCOPY WITH RIGHT RETROGRADE PYELOGRAM, RIGHT URETEROSCOPY AND STENT  PLACEMENT;  Surgeon: Kathie Rhodes, MD;  Location: [facility];  Service: Urology;  Laterality: Right;  . REPLACEMENT TOTAL KNEE Left   . TOTAL SHOULDER REPLACEMENT Right   . TUBAL LIGATION       reports that she quit smoking about 12 years ago. Her smoking use included Cigarettes. She has a 23.00 pack-year smoking history. She has never used smokeless tobacco. She reports that she does not drink alcohol or use drugs.  No Known Allergies  Family History  Problem Relation Age of Onset  . Diabetes  Mother   . Heart disease Mother   . Diabetes Father   . Heart disease Father   . Diabetes Sister   . Diabetes Brother        x3  . Colon cancer Neg Hx   . Esophageal cancer Neg Hx   . Stomach cancer Neg Hx   . Pancreatic cancer Neg Hx     Prior to Admission medications   Medication Sig Start Date End Date Taking? Authorizing Provider  ADVAIR DISKUS 250-50 MCG/DOSE AEPB Inhale 1 puff into the lungs 2 (two) times daily as needed (shortness of breath).  06/06/16  Yes [provider]  albuterol (PROVENTIL HFA;VENTOLIN HFA) 108 (90 Base) MCG/ACT inhaler Inhale 1-2 puffs into the lungs every 6 (six) hours as needed for wheezing. 05/20/16  Yes Larene Pickett, PA-C  atorvastatin (LIPITOR) 40 MG tablet Take 1 tablet (40 mg total) by mouth every evening. Patient taking differently: Take 40 mg by mouth at bedtime.  09/08/16  Yes [name], MD  bisacodyl (DULCOLAX) 5 MG EC tablet Take 5 mg by mouth at bedtime as needed for moderate constipation.    Yes [provider]  Cholecalciferol (VITAMIN D3) 400 units CAPS Take 400 Units by mouth daily.   Yes [provider]  cycloSPORINE (RESTASIS) 0.05 % ophthalmic emulsion Place 1 drop into both eyes 2 (two) times daily. 09/19/16  Yes [name], MD  DULoxetine (CYMBALTA) 30 MG capsule Take 1 capsule (30 mg total) by mouth 2 (two) times daily. Take two capsules po in the morning and one capsule po in the afternoon. 10/16/16  Yes Domenic Polite, MD  fluconazole (DIFLUCAN) 100 MG tablet Take 100 mg by mouth daily.   Yes [provider]  fluticasone (FLONASE) 50 MCG/ACT nasal spray Place 2 sprays into both nostrils at bedtime as needed for allergies or rhinitis. 09/19/16  Yes [name], MD  HYDROcodone-acetaminophen (NORCO/VICODIN) 5-325 MG tablet Take 1 tablet by mouth every 6 (six) hours as needed for moderate pain. 10/16/16  Yes Domenic Polite, MD  levothyroxine (SYNTHROID, LEVOTHROID) 75 MCG tablet Take 1 tablet (75 mcg  total) by mouth daily before breakfast. 09/08/16  Yes [name], MD  metoprolol succinate (TOPROL-XL) 100 MG 24 hr tablet TAKE 1 TABLET BY MOUTH  DAILY WITH OR IMMEDIATLEY  FOLLOWING A MEAL 10/28/16  Yes [name], MD  NEXIUM 40 MG capsule Take 1 capsule (40 mg total) by mouth daily. 10/08/16  Yes Sagardia, Ines Bloomer, MD  nitrofurantoin (MACRODANTIN) 50 MG capsule Take 50 mg by mouth at bedtime.   Yes [provider]  oxycodone (OXY-IR) 5 MG capsule Take 5 mg by mouth every 4 (four) hours as needed for pain.   Yes [provider]  polyethylene glycol (MIRALAX / GLYCOLAX) packet Take 17 g by mouth daily as needed for mild constipation. 10/16/16  Yes Domenic Polite, MD  pregabalin (LYRICA) 150  MG capsule Take 1 capsule (150 mg total) by mouth 2 (two) times daily. 09/08/16  Yes [name], MD  Tetrahydroz-Dextran-PEG-Povid [facility] ADVANCED RELIEF) 0.05-0.1-1-1 % SOLN Apply 1 drop to eye every 4 (four) hours.   Yes [provider]  nitrofurantoin, macrocrystal-monohydrate, (MACROBID) 100 MG capsule Take 1 capsule (100 mg total) by mouth 2 (two) times daily. For 2 weeks, until Kidney surgery Patient not taking: Reported on 10/25/2016 10/16/16   Domenic Polite, MD  senna-docusate (SENOKOT-S) 8.6-50 MG tablet Take 1 tablet by mouth 2 (two) times daily. Patient not taking: Reported on 10/17/2016 10/16/16   Domenic Polite, MD    Physical Exam: Vitals:   10/30/2016 1317 10/05/2016 1500 10/16/2016 1515 10/17/2016 1530  BP: (!) 103/58 (!) 98/58 (!) 102/47 106/78  Pulse: (!) 103 100 98 97  Resp: 14 15 19 15   Temp: 97.9 F (36.6 C)     TempSrc: Axillary     SpO2: 95% 98% 97% 99%  Weight:          Constitutional: NAD, chronically ill-appearing female Eyes: PERRL, lids and conjunctivae normal ENMT: Mucous membranes are dry. Posterior pharynx clear of any exudate or lesions.Normal dentition.  Neck: normal, supple Respiratory: clear to auscultation bilaterally, no wheezing, no  crackles.  Shallow respirations. No accessory muscle use.  Cardiovascular: Regular rate and rhythm, no murmurs / rubs / gallops. No extremity edema. 2+ pedal pulses.  Abdomen: no tenderness, no masses palpated. Bowel sounds positive.  Musculoskeletal: no clubbing / cyanosis.  Decreased muscle tone.  Skin: no rashes, lesions, ulcers. No induration Neurologic: CN 2-12 grossly intact. Strength 5/5 in all 4.  Psychiatric: Normal judgment and insight. Alert and oriented x 3. Normal mood.   Labs on Admission: I have personally reviewed following labs and imaging studies  CBC:  Recent Labs Lab 10/26/2016 1403  WBC 19.4*  NEUTROABS 15.8*  HGB 9.5*  HCT 30.1*  MCV 84.3  PLT 767   Basic Metabolic Panel:  Recent Labs Lab 10/06/2016 1403  NA 131*  K 4.3  CL 97*  CO2 24  GLUCOSE 114*  BUN 43*  CREATININE 2.17*  CALCIUM 7.9*   GFR: Estimated Creatinine Clearance: 19.3 mL/min (A) (by C-G formula based on SCr of 2.17 mg/dL (H)). Liver Function Tests:  Recent Labs Lab 11/01/2016 1403  AST 50*  ALT 11*  ALKPHOS 115  BILITOT 1.1  PROT 5.7*  ALBUMIN 2.1*   No results for input(s): LIPASE, AMYLASE in the last 168 hours. No results for input(s): AMMONIA in the last 168 hours. Coagulation Profile:  Recent Labs Lab 10/16/2016 1403  INR 1.15   Cardiac Enzymes: No results for input(s): CKTOTAL, CKMB, CKMBINDEX, TROPONINI in the last 168 hours. BNP (last 3 results) No results for input(s): PROBNP in the last 8760 hours. HbA1C: No results for input(s): HGBA1C in the last 72 hours. CBG:  Recent Labs Lab 11/01/2016 1312  GLUCAP 123*   Lipid Profile: No results for input(s): CHOL, HDL, LDLCALC, TRIG, CHOLHDL, LDLDIRECT in the last 72 hours. Thyroid Function Tests: No results for input(s): TSH, T4TOTAL, FREET4, T3FREE, THYROIDAB in the last 72 hours. Anemia Panel: No results for input(s): VITAMINB12, FOLATE, FERRITIN, TIBC, IRON, RETICCTPCT in the last 72 hours. Urine analysis:     Component Value Date/Time   COLORURINE AMBER (A) 10/11/2016 2000   APPEARANCEUR CLOUDY (A) 10/11/2016 2000   LABSPEC 1.005 10/11/2016 2000   PHURINE 7.0 10/11/2016 2000   GLUCOSEU NEGATIVE 10/11/2016 2000   HGBUR MODERATE (A) 10/11/2016  [place] 10/11/2016 2000   BILIRUBINUR small (A) 07/26/2016 [place] 10/11/2016 2000   PROTEINUR 100 (A) 10/11/2016 2000   UROBILINOGEN 0.2 07/26/2016 1432   NITRITE NEGATIVE 10/11/2016 2000   LEUKOCYTESUR LARGE (A) 10/11/2016 2000     Radiological Exams on Admission: Dg Chest 1 View  Result Date: 11/01/2016 CLINICAL DATA:  [facility] at nursing facility.  Follow-up femur fracture. EXAM: CHEST 1 VIEW COMPARISON:  Chest radiograph May 20, 2016 FINDINGS: Cardiac silhouette is normal. New fullness of the hila with hazy density RIGHT lung. Reticulonodular densities bilaterally. RIGHT midlung zone granuloma unchanged. No pneumothorax. Status post RIGHT shoulder arthroplasty. IMPRESSION: New hilar fullness seen with vascular congestion and lymphadenopathy. New reticulonodular densities and hazy RIGHT lung which may be artifact or layering pleural effusion. Recommend follow-up PA and lateral views the chest when clinically able. Electronically Signed   By: Elon Alas M.D.   On: 10/04/2016 14:07   Dg Pelvis 1-2 Views  Result Date: 10/26/2016 CLINICAL DATA:  Fall. EXAM: PELVIS - 1-2 VIEW COMPARISON:  CT abdomen pelvis dated September 17, 2016. FINDINGS: There is no evidence of pelvic fracture or diastasis. No pelvic bone lesions are seen. Partially visualized right ureteral stent. Degenerative changes of the lower lumbar spine. IMPRESSION: No definite fracture. If occult hip fracture is suspected or if the patient is unable to bear weight, MRI is the preferred modality for further evaluation. Electronically Signed   By: Titus Dubin M.D.   On: 10/05/2016 14:07   Dg Knee 1-2 Views Left  Result Date: 10/03/2016 CLINICAL DATA:   [facility] at nursing facility.  Follow-up femur fracture. EXAM: LEFT KNEE - 1-2 VIEW COMPARISON:  LEFT femur radiograph October 29, 2016 at 1331 hours FINDINGS: Acute oblique fracture through distal femur involving the superior aspect of the femur hardware, status post total knee arthroplasty. Slight posterior angulation distal bony fragments. No dislocation. Hardware is intact. Expected appearance of the resurfaced patella. Osteopenia without destructive bony lesions. Suppurative patella soft tissue swelling and suspected lipoarthrosis. IMPRESSION: Acute displaced distal femur fracture involving superior aspect of femur hardware, status post total knee arthroplasty. No dislocation. Electronically Signed   By: Elon Alas M.D.   On: 10/31/2016 14:05   Dg Femur Min 2 Views Left  Result Date: 10/28/2016 CLINICAL DATA:  Status post fall. Distal femur fracture seen on mobile x-ray. EXAM: LEFT FEMUR 2 VIEWS COMPARISON:  No recent studies in [facility] FINDINGS: The patient has sustained an acute angulated fracture through the metadiaphysis of the distal femur. The fracture line lies adjacent to the superior aspect of the anterior portion of the femoral component of the knee prosthesis. More proximally the shaft of the femur is intact. The femoral head, neck, and intertrochanteric region are normal where visualized. IMPRESSION: There is an acute angulated femoral fracture just proximal to the femoral component of the left knee prosthesis. Electronically Signed   By: David  Martinique M.D.   On: 10/22/2016 14:00    EKG: Independently reviewed.  Sinus tachycardia  Assessment/Plan Active Problems:   Hypertension   Physical deconditioning   Hypothyroidism   Chronic obstructive pulmonary disease (HCC)   Type 2 diabetes mellitus with complication, without long-term current use of insulin (HCC)   Urothelial carcinoma of kidney, right (HCC)   Gastroesophageal reflux disease   History of recent fall   Right renal mass    Fracture of knee prosthesis (HCC)   AKI (acute kidney injury) ([place])   UTI due to  extended-spectrum beta lactamase (ESBL) producing Escherichia coli   Acute displaced distal femur fracture in the setting of femur hardware and history of knee arthroplasty -Dr. [name] consulted by EDP, will admit patient from [facility] long emergency room to [facility] for operative repair tomorrow. -Based on Lyndel Safe perioperative risk her score is elevated at 3.86% -She has SIRS like physiology in the emergency room with tachycardia, relative hypotension on admission as well as significant leukocytosis, she will be given meropenem given recent ESBL UTI, urinalysis is pending.  Patient will need re-evaluation in the morning prior to surgery, if she still exhibits [place] physiology her surgery may need to be delayed another day  SIRS -She may just be dehydrated, however recent infection is concerning.  Would favor treating empirically for now -WBC can also be related to her fracture  Acute kidney injury -Patient with known renal cell carcinoma on the right, status post stent placement.  She appears to be clinically dry, however will obtain a renal ultrasound to evaluate for stent malfunction/hydronephrosis.  Urinalysis is pending. -IV fluids overnight, repeat renal function in the morning -Avoid nephrotoxic agents  Right upper pole transitional cell carcinoma -She is supposed to be seen in 3 days and she is scheduled for robotic nephrectomy with Dr. [name].  Suspect this will likely need to be delayed  Recent ESBL E. coli urinary tract infection -She does not have any UTI type symptoms, however she has a leukocytosis of 19 (which may also be related to her fracture), and she is hypotensive in the ED.  Her UA looks quite cloudy and purulent per nursing staff.  Urinalysis is pending at this time, will also send a urine culture.  Blood cultures have been already sent.  COPD -No wheezing, this appears  stable, continue Dulera, place on albuterol as needed  GERD -PPI  Chronic pain syndrome -Resume home regimen  Hyperlipidemia -Continue atorvastatin  Hypothyroidism -Continue Synthroid  Type 2 diabetes mellitus -Place on sliding scale  Hypertension -Hold metoprolol due to relative hypotension  Hyponatremia -Suspect related to dehydration, monitor with IV fluids  Normocytic anemia -Of chronic disease   DVT prophylaxis:  SCDs Code Status: Full code Family Communication: No family in the room Disposition Plan: Admit to telemetry/[facility] called: Orthopedic surgery    Admission status: Inpatient   At the time of admission, it appears that the appropriate admission status for this patient is INPATIENT. This is judged to be reasonable and necessary in order to provide the required high service intensity to ensure the patient's safety given the presenting symptoms, physical exam findings, and initial radiographic and laboratory data in the context of their chronic comorbidities. Current circumstances are knee fracture requiring operative repair, and it is felt to place patient at high risk for further clinical deterioration threatening life, limb, or organ. Moreover, it is my clinical judgment that the patient will require inpatient hospital care spanning beyond 2 midnights from the point of admission and that early discharge would result in unnecessary risk of decompensation and readmission or threat to life, limb or bodily function.   Marzetta Board, MD [facility] Pager [phone]  If 7PM-7AM, please contact night-coverage [email or web address] Password [email or web address]  10/28/2016, 4:10 PM

## 2016-10-29 NOTE — Progress Notes (Signed)
Pharmacy Antibiotic Note  Isabel Collins is a 76 y.o. female admitted on 10/22/2016 with possible pneumonia and femur fracture.  Recent hospitalization 8/9-8/14 with acute pyelonephritis.  Received 5 days IV Meropenem for ESBL and was discharged on Macrobid 100mg  po BID x 14 days.  PMH significant for urothelial carcinoma with surgery scheduled for 11/01/16.  Presents to ED today s/p fall with possible left knee fracture. CXR shows possible pneumonia.  Vancomycin 1gm IV x 1 dose given in ED.  Pharmacy has been consulted for Meropenem dosing of pneumonia with h/o ESBL.  Plan:  Meropenem 1gm IV q12h  F/U further cont of Vanc  Follow renal function, cultures/sensitivities  Weight: 144 lb (65.3 kg)  Temp (24hrs), Avg:97.8 F (36.6 C), Min:97.6 F (36.4 C), Max:97.9 F (36.6 C)   Recent Labs Lab 10/26/2016 1403 10/28/2016 1414  WBC 19.4*  --   CREATININE 2.17*  --   LATICACIDVEN  --  1.34    Estimated Creatinine Clearance: 19.3 mL/min (A) (by C-G formula based on SCr of 2.17 mg/dL (H)).    No Known Allergies  Antimicrobials this admission: 8/27 vanc >>   8/27 meropenem >>    Dose adjustments this admission:    Microbiology results: 8/27 BCx: sent 8/27 UCx: sent   Thank you for allowing pharmacy to be a part of this patient's care.  Everette Rank, PharmD 10/10/2016 4:19 PM

## 2016-10-30 ENCOUNTER — Inpatient Hospital Stay (HOSPITAL_COMMUNITY): Payer: Medicare Other

## 2016-10-30 DIAGNOSIS — N390 Urinary tract infection, site not specified: Secondary | ICD-10-CM

## 2016-10-30 DIAGNOSIS — A419 Sepsis, unspecified organism: Secondary | ICD-10-CM

## 2016-10-30 DIAGNOSIS — N19 Unspecified kidney failure: Secondary | ICD-10-CM

## 2016-10-30 DIAGNOSIS — W19XXXA Unspecified fall, initial encounter: Secondary | ICD-10-CM

## 2016-10-30 DIAGNOSIS — R7881 Bacteremia: Secondary | ICD-10-CM

## 2016-10-30 DIAGNOSIS — A4151 Sepsis due to Escherichia coli [E. coli]: Secondary | ICD-10-CM

## 2016-10-30 DIAGNOSIS — C689 Malignant neoplasm of urinary organ, unspecified: Secondary | ICD-10-CM

## 2016-10-30 LAB — BLOOD CULTURE ID PANEL (REFLEXED)
ACINETOBACTER BAUMANNII: NOT DETECTED
CANDIDA ALBICANS: NOT DETECTED
CANDIDA GLABRATA: NOT DETECTED
CANDIDA KRUSEI: NOT DETECTED
CANDIDA PARAPSILOSIS: NOT DETECTED
CANDIDA TROPICALIS: NOT DETECTED
CARBAPENEM RESISTANCE: NOT DETECTED
ENTEROBACTERIACEAE SPECIES: DETECTED — AB
Enterobacter cloacae complex: NOT DETECTED
Enterococcus species: NOT DETECTED
Escherichia coli: DETECTED — AB
HAEMOPHILUS INFLUENZAE: NOT DETECTED
KLEBSIELLA PNEUMONIAE: NOT DETECTED
Klebsiella oxytoca: NOT DETECTED
Listeria monocytogenes: NOT DETECTED
NEISSERIA MENINGITIDIS: NOT DETECTED
Proteus species: NOT DETECTED
Pseudomonas aeruginosa: NOT DETECTED
STREPTOCOCCUS SPECIES: NOT DETECTED
Serratia marcescens: NOT DETECTED
Staphylococcus aureus (BCID): NOT DETECTED
Staphylococcus species: NOT DETECTED
Streptococcus agalactiae: NOT DETECTED
Streptococcus pneumoniae: NOT DETECTED
Streptococcus pyogenes: NOT DETECTED

## 2016-10-30 LAB — COMPREHENSIVE METABOLIC PANEL
ALK PHOS: 114 U/L (ref 38–126)
ALT: 10 U/L — AB (ref 14–54)
ANION GAP: 11 (ref 5–15)
AST: 44 U/L — ABNORMAL HIGH (ref 15–41)
Albumin: 1.7 g/dL — ABNORMAL LOW (ref 3.5–5.0)
BILIRUBIN TOTAL: 0.9 mg/dL (ref 0.3–1.2)
BUN: 40 mg/dL — ABNORMAL HIGH (ref 6–20)
CALCIUM: 7.6 mg/dL — AB (ref 8.9–10.3)
CO2: 22 mmol/L (ref 22–32)
CREATININE: 2.02 mg/dL — AB (ref 0.44–1.00)
Chloride: 99 mmol/L — ABNORMAL LOW (ref 101–111)
GFR, EST AFRICAN AMERICAN: 26 mL/min — AB (ref >60)
GFR, EST NON AFRICAN AMERICAN: 23 mL/min — AB (ref >60)
Glucose, Bld: 86 mg/dL (ref 65–99)
Potassium: 4.1 mmol/L (ref 3.5–5.1)
Sodium: 132 mmol/L — ABNORMAL LOW (ref 135–145)
TOTAL PROTEIN: 5 g/dL — AB (ref 6.5–8.1)

## 2016-10-30 LAB — CBC
HCT: 30.2 % — ABNORMAL LOW (ref 36.0–46.0)
Hemoglobin: 9.3 g/dL — ABNORMAL LOW (ref 12.0–15.0)
MCH: 26.3 pg (ref 26.0–34.0)
MCHC: 30.8 g/dL (ref 30.0–36.0)
MCV: 85.6 fL (ref 78.0–100.0)
Platelets: 229 10*3/uL (ref 150–400)
RBC: 3.53 MIL/uL — AB (ref 3.87–5.11)
RDW: 16.7 % — ABNORMAL HIGH (ref 11.5–15.5)
WBC: 15.5 10*3/uL — AB (ref 4.0–10.5)

## 2016-10-30 LAB — GLUCOSE, CAPILLARY
GLUCOSE-CAPILLARY: 89 mg/dL (ref 65–99)
GLUCOSE-CAPILLARY: 79 mg/dL (ref 65–99)
Glucose-Capillary: 101 mg/dL — ABNORMAL HIGH (ref 65–99)
Glucose-Capillary: 84 mg/dL (ref 65–99)

## 2016-10-30 LAB — SURGICAL PCR SCREEN
MRSA, PCR: NEGATIVE
STAPHYLOCOCCUS AUREUS: NEGATIVE

## 2016-10-30 MED ORDER — CEFAZOLIN SODIUM-DEXTROSE 2-4 GM/100ML-% IV SOLN
2.0000 g | INTRAVENOUS | Status: AC
  Filled 2016-10-30: qty 100

## 2016-10-30 NOTE — Progress Notes (Signed)
PHARMACY - PHYSICIAN COMMUNICATION CRITICAL VALUE ALERT - BLOOD CULTURE IDENTIFICATION (BCID)  Results for orders placed or performed during the hospital encounter of 10/19/2016  Blood Culture ID Panel (Reflexed) (Collected: 10/21/2016  2:03 PM)  Result Value Ref Range   Enterococcus species NOT DETECTED NOT DETECTED   Listeria monocytogenes NOT DETECTED NOT DETECTED   Staphylococcus species NOT DETECTED NOT DETECTED   Staphylococcus aureus NOT DETECTED NOT DETECTED   Streptococcus species NOT DETECTED NOT DETECTED   Streptococcus agalactiae NOT DETECTED NOT DETECTED   Streptococcus pneumoniae NOT DETECTED NOT DETECTED   Streptococcus pyogenes NOT DETECTED NOT DETECTED   Acinetobacter baumannii NOT DETECTED NOT DETECTED   Enterobacteriaceae species DETECTED (A) NOT DETECTED   Enterobacter cloacae complex NOT DETECTED NOT DETECTED   Escherichia coli DETECTED (A) NOT DETECTED   Klebsiella oxytoca NOT DETECTED NOT DETECTED   Klebsiella pneumoniae NOT DETECTED NOT DETECTED   Proteus species NOT DETECTED NOT DETECTED   Serratia marcescens NOT DETECTED NOT DETECTED   Carbapenem resistance NOT DETECTED NOT DETECTED   Haemophilus influenzae NOT DETECTED NOT DETECTED   Neisseria meningitidis NOT DETECTED NOT DETECTED   Pseudomonas aeruginosa NOT DETECTED NOT DETECTED   Candida albicans NOT DETECTED NOT DETECTED   Candida glabrata NOT DETECTED NOT DETECTED   Candida krusei NOT DETECTED NOT DETECTED   Candida parapsilosis NOT DETECTED NOT DETECTED   Candida tropicalis NOT DETECTED NOT DETECTED    Name of physician (or Provider) Contacted: Dr. Erlinda Hong   Scheduled for orthopedic surgery at 11:30am today.   Dr. Conrad [place] ([facility]) also notified, per Dr. Phoebe Sharps request.    Dr. Erlinda Hong to call Dr. Alain Marion.  Changes to prescribed antibiotics required: no changes.  Continue Meropenem. History of ESBL E coli in urine 10/11/16  Arty Baumgartner, [place] Pager: [phone] 10/30/2016  7:54 AM

## 2016-10-30 NOTE — Progress Notes (Signed)
PROGRESS NOTE  Isabel Collins OEU:[id number] DOB: 1940/03/16 DOA: 10/17/2016 PCP: [name], MD    HPI/Recap of past 24 hours:  Drowsy, only oriented to self, does follow commands, no agitation, no fever  Assessment/Plan: Principal Problem:   Traumatic closed displaced supracondylar fracture of left femur ([place]) Active Problems:   Hypertension   Physical deconditioning   Hypothyroidism   Chronic obstructive pulmonary disease ([place])   Type 2 diabetes mellitus with complication, without long-term current use of insulin (HCC)   Urothelial carcinoma of kidney, right (HCC)   Gastroesophageal reflux disease   History of recent fall   Right renal mass   Fracture of knee prosthesis (HCC)   AKI (acute kidney injury) ([place])   UTI due to extended-spectrum beta lactamase (ESBL) producing Escherichia coli   Closed displaced left supracondylar femur fracture -Nonweightbearing left lower extremity. Maintain knee immobilizer -Case discussed with ortho Dr [name] in am, planned surgery today cancelled due to bacteremia, tentatively reschedule on Friday with IM nail after a few days of iv abx and repeat blood culture no growth.  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  Encephalopathy: Very lethargic, Only oriented to self on 8/28 am, now sure baseline On aspiration precaution. Speech to see.  Transitional cell carcinoma  Case discussed with urology Dr [name] in am , Planned surgery on 8/30 cancelled Per Dr [name]:  " She was seen in my office on 8/20 and urine culture grew <10K Dipthroids and yeast.  Opted to treat yeast infection with daily fluconazole until surgery. She was also on suppression Macrobid 50mg  QHS.  " "will reschedule it ~2 weeks from the time  of her femor repair as discussed with Dr. [name].  She should be kept on appropriate abx until her surgery for the kidney removal."  CT chest  Done on 8/28 pm: "Masslike area soft tissue that appears within the superior right perinephric space, associated with upper abdominal adenopathy. These findings are new from the prior CT. Recommend followup chest CT, with contrast if this patient can tolerate iodinated contrast."  CT chest also showed right pleural effusion: US guided thoracentesis ordered, cytology/cell count/culture ordered.   AKI on CKD II From uti/sepsis, no hydro, she dose has urine retention, foley inserted on 8/28 Expect improvement Renal dosing meds   Elevated lft : likely from sepsis, expect improvement.  HTN: bp borderline low in the setting of sepsis, home med lopressor held since admission, likely able to resume soon .  Borderline DM2, not on meds at home, a1c in 06/2016 was 7. On ssi here  HLD: continue home meds statin.  COPD: no wheezing, denies sob  Hypothyroid: continue synthroid    Code Status: full  Family Communication: patient   Disposition Plan: not ready to discharge   Consultants:  Ortho Dr [name]  Urology Dr [name]  Procedures:  none  Antibiotics:  Meropenem    Objective: BP (!) 101/46 (BP Location: Left Arm)   Pulse (!) 109   Temp 98.5 F (36.9 C) (Oral)   Resp 18   Ht 5\' 2"  (1.575 m)   Wt 65.3 kg (144 lb)   SpO2 94%   BMI 26.34 kg/m   Intake/Output Summary (Last 24 hours) at 10/30/16 0830 Last data filed at 10/30/16 0545  Gross per 24 hour  Intake          1991.67 ml  Output                0 ml  Net          1991.67 ml   Filed Weights   10/30/2016 1233  Weight: 65.3 kg (144 lb)    Exam: Patient is examined daily including today on 10/30/2016, exams remain the same as of yesterday except that has changed    General:  Frail, thin, drowsy, oriented to self only, dry oral mucosa  Cardiovascular: sinus  tachycardia  Respiratory: diminished right base, no wheezing, no rhonchi,   Abdomen: Soft/ND/NT, positive BS  Musculoskeletal: No Edema  Neuro: drowsy, oriented to self only  Data Reviewed: Basic Metabolic Panel:  Recent Labs Lab 10/26/2016 1403 10/30/16 0253  NA 131* 132*  K 4.3 4.1  CL 97* 99*  CO2 24 22  GLUCOSE 114* 86  BUN 43* 40*  CREATININE 2.17* 2.02*  CALCIUM 7.9* 7.6*   Liver Function Tests:  Recent Labs Lab 10/27/2016 1403 10/30/16 0253  AST 50* 44*  ALT 11* 10*  ALKPHOS 115 114  BILITOT 1.1 0.9  PROT 5.7* 5.0*  ALBUMIN 2.1* 1.7*   No results for input(s): LIPASE, AMYLASE in the last 168 hours. No results for input(s): AMMONIA in the last 168 hours. CBC:  Recent Labs Lab 10/26/2016 1403 10/30/16 0253  WBC 19.4* 15.5*  NEUTROABS 15.8*  --   HGB 9.5* 9.3*  HCT 30.1* 30.2*  MCV 84.3 85.6  PLT 271 229   Cardiac Enzymes:   No results for input(s): CKTOTAL, CKMB, CKMBINDEX, TROPONINI in the last 168 hours. BNP (last 3 results) No results for input(s): BNP in the last 8760 hours.  ProBNP (last 3 results) No results for input(s): PROBNP in the last 8760 hours.  CBG:  Recent Labs Lab 10/16/2016 1312 10/17/2016 2234 10/30/16 0808  GLUCAP 123* 98 89    Recent Results (from the past 240 hour(s))  Blood culture (routine x 2)     Status: None (Preliminary result)   Collection Time: 10/27/2016  2:03 PM  Result Value Ref Range Status   Specimen Description BLOOD PICC LINE RIGHT ARM  Final   Special Requests   Final    BOTTLES DRAWN AEROBIC AND ANAEROBIC Blood Culture adequate volume   Culture  Setup Time   Final    GRAM NEGATIVE RODS IN BOTH AEROBIC AND ANAEROBIC BOTTLES CRITICAL RESULT CALLED TO, READ BACK BY AND VERIFIED WITH: T.EGAN, PHARMD 10/30/16 0715 L.CHAMPION Performed at [facility], [place] [address] [place], [place] [address]    Culture GRAM NEGATIVE RODS  Final   Report Status PENDING  Incomplete  Blood Culture ID Panel (Reflexed)      Status: Abnormal   Collection Time: 11/02/2016  2:03 PM  Result Value Ref Range Status   Enterococcus species NOT DETECTED NOT DETECTED Final   Listeria monocytogenes NOT DETECTED NOT DETECTED Final   Staphylococcus species NOT DETECTED NOT DETECTED Final   Staphylococcus aureus NOT DETECTED NOT DETECTED Final   Streptococcus species NOT DETECTED NOT DETECTED Final   Streptococcus agalactiae NOT DETECTED NOT DETECTED Final   Streptococcus pneumoniae NOT DETECTED NOT DETECTED Final   Streptococcus pyogenes NOT DETECTED NOT DETECTED Final   Acinetobacter baumannii NOT DETECTED NOT DETECTED Final   Enterobacteriaceae species DETECTED (A) NOT DETECTED Final    Comment: Enterobacteriaceae represent a large family of gram-negative bacteria, not a single organism. CRITICAL  RESULT CALLED TO, READ BACK BY AND VERIFIED WITH: T.EGAN, PHARMD 10/30/16 0715 L.CHAMPION    Enterobacter cloacae complex NOT DETECTED NOT DETECTED Final   Escherichia coli DETECTED (A) NOT DETECTED Final    Comment: CRITICAL RESULT CALLED TO, READ BACK BY AND VERIFIED WITH: T.EGAN, PHARMD 10/30/16 0715 L.CHAMPION    Klebsiella oxytoca NOT DETECTED NOT DETECTED Final   Klebsiella pneumoniae NOT DETECTED NOT DETECTED Final   Proteus species NOT DETECTED NOT DETECTED Final   Serratia marcescens NOT DETECTED NOT DETECTED Final   Carbapenem resistance NOT DETECTED NOT DETECTED Final   Haemophilus influenzae NOT DETECTED NOT DETECTED Final   Neisseria meningitidis NOT DETECTED NOT DETECTED Final   Pseudomonas aeruginosa NOT DETECTED NOT DETECTED Final   Candida albicans NOT DETECTED NOT DETECTED Final   Candida glabrata NOT DETECTED NOT DETECTED Final   Candida krusei NOT DETECTED NOT DETECTED Final   Candida parapsilosis NOT DETECTED NOT DETECTED Final   Candida tropicalis NOT DETECTED NOT DETECTED Final    Comment: Performed at [facility], [place] [address] [place], [place] [address]  Surgical pcr screen     Status:  None   Collection Time: 10/31/2016  9:08 PM  Result Value Ref Range Status   MRSA, PCR NEGATIVE NEGATIVE Final   Staphylococcus aureus NEGATIVE NEGATIVE Final    Comment:        The Xpert SA Assay (FDA approved for NASAL specimens in patients over 61 years of age), is one component of a comprehensive surveillance program.  Test performance has been validated by [facility] for patients greater than or equal to 42 year old. It is not intended to diagnose infection nor to guide or monitor treatment.      Studies: Dg Chest 1 View  Result Date: 11/01/2016 CLINICAL DATA:  [facility] at nursing facility.  Follow-up femur fracture. EXAM: CHEST 1 VIEW COMPARISON:  Chest radiograph May 20, 2016 FINDINGS: Cardiac silhouette is normal. New fullness of the hila with hazy density RIGHT lung. Reticulonodular densities bilaterally. RIGHT midlung zone granuloma unchanged. No pneumothorax. Status post RIGHT shoulder arthroplasty. IMPRESSION: New hilar fullness seen with vascular congestion and lymphadenopathy. New reticulonodular densities and hazy RIGHT lung which may be artifact or layering pleural effusion. Recommend follow-up PA and lateral views the chest when clinically able. Electronically Signed   By: Elon Alas M.D.   On: 10/25/2016 14:07   Dg Pelvis 1-2 Views  Result Date: 10/28/2016 CLINICAL DATA:  Fall. EXAM: PELVIS - 1-2 VIEW COMPARISON:  CT abdomen pelvis dated September 17, 2016. FINDINGS: There is no evidence of pelvic fracture or diastasis. No pelvic bone lesions are seen. Partially visualized right ureteral stent. Degenerative changes of the lower lumbar spine. IMPRESSION: No definite fracture. If occult hip fracture is suspected or if the patient is unable to bear weight, MRI is the preferred modality for further evaluation. Electronically Signed   By: Titus Dubin M.D.   On: 10/06/2016 14:07   Dg Knee 1-2 Views Left  Result Date: 10/23/2016 CLINICAL DATA:  [facility] at nursing facility.   Follow-up femur fracture. EXAM: LEFT KNEE - 1-2 VIEW COMPARISON:  LEFT femur radiograph October 29, 2016 at 1331 hours FINDINGS: Acute oblique fracture through distal femur involving the superior aspect of the femur hardware, status post total knee arthroplasty. Slight posterior angulation distal bony fragments. No dislocation. Hardware is intact. Expected appearance of the resurfaced patella. Osteopenia without destructive bony lesions. Suppurative patella soft tissue swelling and suspected lipoarthrosis. IMPRESSION: Acute displaced  distal femur fracture involving superior aspect of femur hardware, status post total knee arthroplasty. No dislocation. Electronically Signed   By: Elon Alas M.D.   On: 10/19/2016 14:05   US Renal  Result Date: 10/30/2016 CLINICAL DATA:  Acute renal failure. EXAM: RENAL / URINARY TRACT ULTRASOUND COMPLETE COMPARISON:  CT 09/17/2016. FINDINGS: Right Kidney: Length: 12.4 cm.  Echogenicity within normal limits. What appears to be the right ureteral stent is again noted in the region of the renal pelvis. Minimal calyceal dilatation. 5.0 x 6.4 x 4.9 cm hypoechoic mass is in the region of the renal pelvis, reference is made to prior CT report 09/17/2016 and 07/19/2016. Left kidney: Length: 11.9 cm. Echogenicity within normal limits. No mass or hydronephrosis visualized. Bladder: Bladder is nondistended.  Foley catheter in place. IMPRESSION: 1. What appears to be of right ureteral stents again noted the region of the right renal pelvis. Minimal calyceal dilatation. 2. Prominent 5.0 x 6.4 x 4.9 cm hypoechoic mass is again in the region of the renal pelvis, reference is made to prior CT report of 09/17/2016 and 07/19/2016. Findings again consistent with uroepithelial tumor. Electronically Signed   By: Marcello Moores  Register   On: 10/30/2016 06:27   Dg Femur Min 2 Views Left  Result Date: 10/28/2016 CLINICAL DATA:  Status post fall. Distal femur fracture seen on mobile x-ray. EXAM: LEFT  FEMUR 2 VIEWS COMPARISON:  No recent studies in [facility] FINDINGS: The patient has sustained an acute angulated fracture through the metadiaphysis of the distal femur. The fracture line lies adjacent to the superior aspect of the anterior portion of the femoral component of the knee prosthesis. More proximally the shaft of the femur is intact. The femoral head, neck, and intertrochanteric region are normal where visualized. IMPRESSION: There is an acute angulated femoral fracture just proximal to the femoral component of the left knee prosthesis. Electronically Signed   By: David  Martinique M.D.   On: 10/09/2016 14:00    Scheduled Meds: . atorvastatin  40 mg Oral QHS  . cycloSPORINE  1 drop Both Eyes BID  . DULoxetine  30 mg Oral q1800  . DULoxetine  60 mg Oral Daily  . fluconazole  100 mg Oral Daily  . insulin aspart  0-9 Units Subcutaneous TID WC  . levothyroxine  75 mcg Oral QAC breakfast  . mometasone-formoterol  2 puff Inhalation BID  . pantoprazole  40 mg Oral Daily  . pregabalin  150 mg Oral BID    Continuous Infusions: . sodium chloride 115 mL/hr at 10/30/16 0714  .  ceFAZolin (ANCEF) IV    . lactated ringers    . meropenem (MERREM) IV Stopped (10/30/16 0530)     Time spent: 50 mins, more than 50% time spent on coordination of care. I have personally reviewed and interpreted on  10/30/2016 daily labs, tele strips, imagings as discussed above under date review session and assessment and plans.  I reviewed all nursing notes, pharmacy notes, consultant notes,  vitals, pertinent old records  I have discussed plan of care as described above with orthopedic, urology, RN , patient  on 10/30/2016   Mandrell Vangilder MD, PhD  [facility] Pager [phone]. If 7PM-7AM, please contact night-coverage at [email or web address], password [facility] 10/30/2016, 8:30 AM  LOS: 1 day

## 2016-10-30 NOTE — Progress Notes (Signed)
In  & out cath.done under sterile technique & obtained 280 ccamber with sediments

## 2016-10-30 NOTE — Progress Notes (Signed)
Patient scheduled for nephro-ureterectomy this Friday.   She was seen in my office on 8/20 and urine culture grew <10K Dipthroids and yeast.  Opted to treat yeast infection with daily fluconazole until surgery. She was also on suppression Macrobid 50mg  QHS.    Urine/blood cultures positive upon admission.  Ultrasound demonstrates good stent position and no evidence of obstruction.    Obviously this will delay things until her femor can be repaired. I have canceled her surgery for 8/30 and will reschedule it ~2 weeks from the time of her femor repair as discussed with Dr. [name].  She should be kept on appropriate abx until her surgery for the kidney removal.  There is nothing further that needs to be done from my prospective.  Please page or text me with further questions/concerns.  [phone]

## 2016-10-30 NOTE — Progress Notes (Signed)
Due to bacterimia will delay IM nail until Friday  In the mean time, NWB LLE, knee immobilizer full time

## 2016-10-30 NOTE — Progress Notes (Signed)
Initial Nutrition Assessment  DOCUMENTATION CODES:   Not applicable  INTERVENTION:   -Ensure Enlive po BID, each supplement provides 350 kcal and 20 grams of protein -MVI daily  NUTRITION DIAGNOSIS:   Increased nutrient needs related to  (post-op healing) as evidenced by estimated needs.  GOAL:   Patient will meet greater than or equal to 90% of their needs  MONITOR:   PO intake, Supplement acceptance, Labs, Weight trends, Skin, I & O's  REASON FOR ASSESSMENT:   Malnutrition Screening Tool    ASSESSMENT:   Isabel Collins is a 76 y.o. female with medical history significant of COPD, diabetes mellitus, hypertension, hyperlipidemia, recent diagnosis of right upper pole transitional cell carcinoma, seen recently by urology with recent ureteroscopy/biopsy and stent, supposed to have nephrectomy 3 days from now, presents to the emergency room from her skilled nursing home after having a fall and injuring her left knee.   Pt admitted with acute displaced lt femur fx.   Surgery is being delayed until Friday, due to bacteremia and needs for IV antibiotics.   Spoke with RN, who reports diet was just advanced. She is taking medications without difficulty, however, is currently in a lot of pain.   Pt shares fair appetite PTA. She does not feel hungry currently. She is unsure if she has lost weight.   Reviewed wt hx, which revealed a 10# (6.4%) wt loss over the past 3 months, which is not significant for time frame, however, concerning given decreased appetite and advanced age.   Nutrition-Focused physical exam completed. Findings are mild fat depletion, no muscle depletion, and no edema.   Discussed importance of good meal completion to promote healing.  Labs reviewed: Na: 132 (on IV supplementation).   Diet Order:  DIET - DYS 1 Room service appropriate? Yes; Fluid consistency: Thin  Skin:  Reviewed, no issues  Last BM:  PTA  Height:   Ht Readings from Last 1  Encounters:  10/13/2016 5\' 2"  (1.575 m)    Weight:   Wt Readings from Last 1 Encounters:  10/08/2016 144 lb (65.3 kg)    Ideal Body Weight:  50 kg  BMI:  Body mass index is 26.34 kg/m.  Estimated Nutritional Needs:   Kcal:  1650-1850  Protein:  80-95 grams  Fluid:  1.6-1.8 L  EDUCATION NEEDS:   Education needs addressed  Bralynn Donado A. Jimmye Norman, RD, LDN, CDE Pager: [phone] After hours Pager: [phone]

## 2016-10-30 NOTE — NC FL2 (Signed)
[place] MEDICAID FL2 LEVEL OF CARE SCREENING TOOL     IDENTIFICATION  Patient Name: Isabel Collins Birthdate: [birth year] Sex: female Admission Date (Current Location): 10/11/2016  [facility] and [place] Number:  Herbalist and Address:  [facility] [place], [place] [address] [place], [place] [address]      Provider Number: [id number]  Attending Physician Name and Address:  Florencia Reasons, MD  Relative Name and Phone Number:       Current Level of Care: Hospital Recommended Level of Care: [place] Prior Approval Number:    Date Approved/Denied:   PASRR Number: [id number]  Discharge Plan: SNF    Current Diagnoses: Patient Active Problem List   Diagnosis Date Noted  . Acute renal failure (ARF) ([place])   . Fall   . Sepsis due to Escherichia coli (E. coli) ([place])   . Bacteremia due to Gram-negative bacteria   . Sepsis secondary to UTI ([place])   . Transitional cell carcinoma ([place])   . Fracture of knee prosthesis ([place]) 10/15/2016  . AKI (acute kidney injury) ([place]) 10/25/2016  . UTI due to extended-spectrum beta lactamase (ESBL) producing Escherichia coli 10/19/2016  . Traumatic closed displaced supracondylar fracture of left femur ([place]) 10/27/2016  . Chronic constipation 10/12/2016  . Acute pyelonephritis 10/11/2016  . Right renal mass 10/08/2016  . Right flank pain, chronic 10/08/2016  . Chronic pain syndrome 10/08/2016  . History of recent fall 10/05/2016  . Physical deconditioning 10/01/2016  . Hypothyroidism 10/01/2016  . Chronic obstructive pulmonary disease ([place]) 10/01/2016  . Type 2 diabetes mellitus with complication, without long-term current use of insulin ([place]) 10/01/2016  . 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 Urothelial carcinoma of kidney, right ([place]) 10/01/2016  . Gastroesophageal reflux disease 10/01/2016  . Dyspnea on exertion 06/25/2016  . Fibromyalgia 05/23/2016  . Spondylosis without myelopathy or radiculopathy,  lumbosacral region 05/10/2016  . Hypertension 02/10/2016  . High cholesterol 02/10/2016  . Hiatal hernia 02/10/2016    Orientation RESPIRATION BLADDER Height & Weight     Place, Self  O2 (Nasal cannula 2L) Incontinent, External catheter Weight: 65.3 kg (144 lb) Height:  5\' 2"  (157.5 cm)  BEHAVIORAL SYMPTOMS/MOOD NEUROLOGICAL BOWEL NUTRITION STATUS      Continent Diet (Please see DC Summary)  AMBULATORY STATUS COMMUNICATION OF NEEDS Skin   Extensive Assist Verbally Normal                       Personal Care Assistance Level of Assistance  Bathing, Feeding, Dressing Bathing Assistance: Maximum assistance Feeding assistance: Independent Dressing Assistance: Limited assistance     Functional Limitations Info  Sight, Hearing, Speech Sight Info: Adequate Hearing Info: Adequate Speech Info: Adequate    SPECIAL CARE FACTORS FREQUENCY  PT (By licensed PT)     PT Frequency: 5x/week              Contractures Contractures Info: Not present    Additional Factors Info  Code Status, Allergies, Isolation Precautions Code Status Info: Full Allergies Info: NKA     Isolation Precautions Info: ESBL     Current Medications (10/30/2016):  This is the current hospital active medication list Current Facility-Administered Medications  Medication Dose Route Frequency Provider Last Rate Last Dose  . 0.9 %  sodium chloride infusion   Intravenous Continuous Opyd, Ilene Qua, MD 115 mL/hr at 10/30/16 0714    . albuterol (PROVENTIL) (2.5 MG/3ML) 0.083% nebulizer solution 2.5 mg  2.5 mg Nebulization Q4H  PRN Caren Griffins, MD      . atorvastatin (LIPITOR) tablet 40 mg  40 mg Oral QHS Caren Griffins, MD   40 mg at 10/08/2016 2150  . bisacodyl (DULCOLAX) EC tablet 5 mg  5 mg Oral QHS PRN Caren Griffins, MD      . ceFAZolin (ANCEF) IVPB 2g/100 mL premix  2 g Intravenous On Call to OR Prudencio Burly III, PA-C      . cycloSPORINE (RESTASIS) 0.05 % ophthalmic emulsion 1 drop   1 drop Both Eyes BID Caren Griffins, MD   1 drop at 10/20/2016 2319  . DULoxetine (CYMBALTA) DR capsule 30 mg  30 mg Oral q1800 Caren Griffins, MD   30 mg at 10/20/2016 2150  . DULoxetine (CYMBALTA) DR capsule 60 mg  60 mg Oral Daily Gherghe, Costin M, MD      . fluconazole (DIFLUCAN) tablet 100 mg  100 mg Oral Daily Gherghe, Costin M, MD      . fluticasone (FLONASE) 50 MCG/ACT nasal spray 2 spray  2 spray Each Nare QHS PRN Caren Griffins, MD      . insulin aspart (novoLOG) injection 0-9 Units  0-9 Units Subcutaneous TID WC Gherghe, Costin M, MD      . lactated ringers infusion   Intravenous Continuous Martensen, Charna Elizabeth III, PA-C      . levothyroxine (SYNTHROID, LEVOTHROID) tablet 75 mcg  75 mcg Oral QAC breakfast Caren Griffins, MD   75 mcg at 10/30/16 0850  . meropenem (MERREM) 1 g in sodium chloride 0.9 % 100 mL IVPB  1 g Intravenous Q12H Nilda Simmer, RPH   Stopped at 10/30/16 0530  . mometasone-formoterol (DULERA) 200-5 MCG/ACT inhaler 2 puff  2 puff Inhalation BID Caren Griffins, MD   Stopped at 10/09/2016 1959  . oxyCODONE (Oxy IR/ROXICODONE) immediate release tablet 5 mg  5 mg Oral Q4H PRN Caren Griffins, MD      . pantoprazole (PROTONIX) EC tablet 40 mg  40 mg Oral Daily Gherghe, Costin M, MD      . pregabalin (LYRICA) capsule 150 mg  150 mg Oral BID Caren Griffins, MD   150 mg at 10/12/2016 2149  . senna-docusate (Senokot-S) tablet 1 tablet  1 tablet Oral QHS PRN Caren Griffins, MD         Discharge Medications: Please see discharge summary for a list of discharge medications.  Relevant Imaging Results:  Relevant Lab Results:   Additional Information SS# [id number]  Benard Halsted, LCSWA

## 2016-10-30 NOTE — Progress Notes (Signed)
Orthopedic Tech Progress Note Patient Details:  Isabel Collins [birth year] [id number]  Ortho Devices Type of Ortho Device: Knee Immobilizer Ortho Device/Splint Location: lle Ortho Device/Splint Interventions: Application   Jef Futch 10/30/2016, 8:41 AM

## 2016-10-30 NOTE — Consult Note (Signed)
ORTHOPAEDIC CONSULTATION  REQUESTING PHYSICIAN: Caren Griffins, MD  Chief Complaint: left knee pain  Assessment / Plan: Principal Problem:   Traumatic closed displaced supracondylar fracture of left femur ([place]) Active Problems:   Hypertension   Physical deconditioning   Hypothyroidism   Chronic obstructive pulmonary disease (HCC)   Type 2 diabetes mellitus with complication, without long-term current use of insulin (HCC)   Urothelial carcinoma of kidney, right (HCC)   Gastroesophageal reflux disease   History of recent fall   Right renal mass   Fracture of knee prosthesis (HCC)   AKI (acute kidney injury) ([place])   UTI due to extended-spectrum beta lactamase (ESBL) producing Escherichia coli  Closed displaced left supracondylar femur fracture  Multiple medical comorbidities including Sirs like physiology being evaluated by medicine-will await recommendations regarding patient's fitness to undergo surgery.  Nonweightbearing left lower extremity. Maintain knee immobilizer.  IM retrograde nail recommended as surgical fixation as soon as later this afternoon pending medicine evaluations.   HPI: Isabel Collins is a 76 y.o. female who complains of left knee pain after falling at her facility. An employee at the facility was helping her up when she fell. She presented to the emergency department where x-rays showed a displaced left supracondylar femur fracture. Total knee arthroplasty Hardware appears stable. Orthopedics was consulted for evaluation.  Past Medical History:  Diagnosis Date  . Anemia   . Anxiety   . Chronic constipation   . COPD (chronic obstructive pulmonary disease) ([place])   . Degenerative arthritis of spine   . Diverticulosis of colon   . Fibromyalgia   . GERD (gastroesophageal reflux disease)   . Gross hematuria   . Hiatal hernia   . History of chronic bronchitis   . History of colon polyps   . Hyperlipidemia   . Hypertension   .  Hypothyroidism   . IBS (irritable bowel syndrome)   . OA (osteoarthritis)    hips  . Renal mass, right    Past Surgical History:  Procedure Laterality Date  . arm surgery Left   . BREAST REDUCTION SURGERY Bilateral   . CHOLECYSTECTOMY    . CYSTOSCOPY WITH BIOPSY Right 08/27/2016   Procedure: CYSTOSCOPY WITH BIOPSY;  Surgeon: Kathie Rhodes, MD;  Location[facility];  Service: Urology;  Laterality: Right;  . CYSTOSCOPY WITH RETROGRADE PYELOGRAM, URETEROSCOPY AND STENT PLACEMENT Right 08/27/2016   Procedure: CYSTOSCOPY WITH RIGHT RETROGRADE PYELOGRAM, RIGHT URETEROSCOPY AND STENT PLACEMENT;  Surgeon: Kathie Rhodes, MD;  Location[facility];  Service: Urology;  Laterality: Right;  . REPLACEMENT TOTAL KNEE Left   . TOTAL SHOULDER REPLACEMENT Right   . TUBAL LIGATION     Social History   Social History  . Marital status: Divorced    Spouse name: N/A  . Number of children: N/A  . Years of education: N/A   Social History Main Topics  . Smoking status: Former Smoker    Packs/day: 0.50    Years: 46.00    Types: Cigarettes    Quit date: 03/05/2004  . Smokeless tobacco: Never Used  . Alcohol use No  . Drug use: No  . Sexual activity: Not Asked   Other Topics Concern  . None   Social History Narrative  . None   Family History  Problem Relation Age of Onset  . Diabetes Mother   . Heart disease Mother   . Diabetes Father   . Heart disease Father   . Diabetes Sister   .  Diabetes Brother        x3  . Colon cancer Neg Hx   . Esophageal cancer Neg Hx   . Stomach cancer Neg Hx   . Pancreatic cancer Neg Hx    No Known Allergies Prior to Admission medications   Medication Sig Start Date End Date Taking? Authorizing Provider  ADVAIR DISKUS 250-50 MCG/DOSE AEPB Inhale 1 puff into the lungs 2 (two) times daily as needed (shortness of breath).  06/06/16  Yes [provider]  albuterol (PROVENTIL HFA;VENTOLIN HFA) 108 (90 Base) MCG/ACT inhaler  Inhale 1-2 puffs into the lungs every 6 (six) hours as needed for wheezing. 05/20/16  Yes Larene Pickett, PA-C  atorvastatin (LIPITOR) 40 MG tablet Take 1 tablet (40 mg total) by mouth every evening. Patient taking differently: Take 40 mg by mouth at bedtime.  09/08/16  Yes [name], MD  bisacodyl (DULCOLAX) 5 MG EC tablet Take 5 mg by mouth at bedtime as needed for moderate constipation.    Yes [provider]  Cholecalciferol (VITAMIN D3) 400 units CAPS Take 400 Units by mouth daily.   Yes [provider]  cycloSPORINE (RESTASIS) 0.05 % ophthalmic emulsion Place 1 drop into both eyes 2 (two) times daily. 09/19/16  Yes [name], MD  DULoxetine (CYMBALTA) 30 MG capsule Take 1 capsule (30 mg total) by mouth 2 (two) times daily. Take two capsules po in the morning and one capsule po in the afternoon. 10/16/16  Yes Domenic Polite, MD  fluconazole (DIFLUCAN) 100 MG tablet Take 100 mg by mouth daily.   Yes [provider]  fluticasone (FLONASE) 50 MCG/ACT nasal spray Place 2 sprays into both nostrils at bedtime as needed for allergies or rhinitis. 09/19/16  Yes [name], MD  HYDROcodone-acetaminophen (NORCO/VICODIN) 5-325 MG tablet Take 1 tablet by mouth every 6 (six) hours as needed for moderate pain. 10/16/16  Yes Domenic Polite, MD  levothyroxine (SYNTHROID, LEVOTHROID) 75 MCG tablet Take 1 tablet (75 mcg total) by mouth daily before breakfast. 09/08/16  Yes [name], MD  metoprolol succinate (TOPROL-XL) 100 MG 24 hr tablet TAKE 1 TABLET BY MOUTH  DAILY WITH OR IMMEDIATLEY  FOLLOWING A MEAL 10/28/16  Yes [name], MD  NEXIUM 40 MG capsule Take 1 capsule (40 mg total) by mouth daily. 10/08/16  Yes Sagardia, Ines Bloomer, MD  nitrofurantoin (MACRODANTIN) 50 MG capsule Take 50 mg by mouth at bedtime.   Yes [provider]  oxycodone (OXY-IR) 5 MG capsule Take 5 mg by mouth every 4 (four) hours as needed for pain.   Yes [provider]  polyethylene glycol  (MIRALAX / GLYCOLAX) packet Take 17 g by mouth daily as needed for mild constipation. 10/16/16  Yes Domenic Polite, MD  pregabalin (LYRICA) 150 MG capsule Take 1 capsule (150 mg total) by mouth 2 (two) times daily. 09/08/16  Yes [name], MD  Tetrahydroz-Dextran-PEG-Povid [facility] ADVANCED RELIEF) 0.05-0.1-1-1 % SOLN Apply 1 drop to eye every 4 (four) hours.   Yes [provider]  nitrofurantoin, macrocrystal-monohydrate, (MACROBID) 100 MG capsule Take 1 capsule (100 mg total) by mouth 2 (two) times daily. For 2 weeks, until Kidney surgery Patient not taking: Reported on 10/17/2016 10/16/16   Domenic Polite, MD  senna-docusate (SENOKOT-S) 8.6-50 MG tablet Take 1 tablet by mouth 2 (two) times daily. Patient not taking: Reported on 10/19/2016 10/16/16   Domenic Polite, MD   Dg Chest 1 View  Result Date: 10/06/2016 CLINICAL DATA:  Fell at nursing facility.  Follow-up femur fracture. EXAM: CHEST 1 VIEW COMPARISON:  Chest radiograph May 20, 2016 FINDINGS: Cardiac silhouette is normal. New fullness of the hila with hazy density RIGHT lung. Reticulonodular densities bilaterally. RIGHT midlung zone granuloma unchanged. No pneumothorax. Status post RIGHT shoulder arthroplasty. IMPRESSION: New hilar fullness seen with vascular congestion and lymphadenopathy. New reticulonodular densities and hazy RIGHT lung which may be artifact or layering pleural effusion. Recommend follow-up PA and lateral views the chest when clinically able. Electronically Signed   By: Elon Alas M.D.   On: 10/21/2016 14:07   Dg Pelvis 1-2 Views  Result Date: 10/28/2016 CLINICAL DATA:  Fall. EXAM: PELVIS - 1-2 VIEW COMPARISON:  CT abdomen pelvis dated September 17, 2016. FINDINGS: There is no evidence of pelvic fracture or diastasis. No pelvic bone lesions are seen. Partially visualized right ureteral stent. Degenerative changes of the lower lumbar spine. IMPRESSION: No definite fracture. If occult hip fracture is suspected or if  the patient is unable to bear weight, MRI is the preferred modality for further evaluation. Electronically Signed   By: Titus Dubin M.D.   On: 10/24/2016 14:07   Dg Knee 1-2 Views Left  Result Date: 10/06/2016 CLINICAL DATA:  [place] at nursing facility.  Follow-up femur fracture. EXAM: LEFT KNEE - 1-2 VIEW COMPARISON:  LEFT femur radiograph October 29, 2016 at 1331 hours FINDINGS: Acute oblique fracture through distal femur involving the superior aspect of the femur hardware, status post total knee arthroplasty. Slight posterior angulation distal bony fragments. No dislocation. Hardware is intact. Expected appearance of the resurfaced patella. Osteopenia without destructive bony lesions. Suppurative patella soft tissue swelling and suspected lipoarthrosis. IMPRESSION: Acute displaced distal femur fracture involving superior aspect of femur hardware, status post total knee arthroplasty. No dislocation. Electronically Signed   By: Elon Alas M.D.   On: 11/02/2016 14:05   Dg Femur Min 2 Views Left  Result Date: 10/15/2016 CLINICAL DATA:  Status post fall. Distal femur fracture seen on mobile x-ray. EXAM: LEFT FEMUR 2 VIEWS COMPARISON:  No recent studies in [facility] FINDINGS: The patient has sustained an acute angulated fracture through the metadiaphysis of the distal femur. The fracture line lies adjacent to the superior aspect of the anterior portion of the femoral component of the knee prosthesis. More proximally the shaft of the femur is intact. The femoral head, neck, and intertrochanteric region are normal where visualized. IMPRESSION: There is an acute angulated femoral fracture just proximal to the femoral component of the left knee prosthesis. Electronically Signed   By: David  Martinique M.D.   On: 10/21/2016 14:00    Positive ROS: All other systems have been reviewed and were otherwise negative with the exception of those mentioned in the HPI and as above.  Objective: Labs cbc  Recent Labs   10/15/2016 1403 10/30/16 0253  WBC 19.4* 15.5*  HGB 9.5* 9.3*  HCT 30.1* 30.2*  PLT 271 229    Labs inflam No results for input(s): CRP in the last 72 hours.  Invalid input(s): ESR  Labs coag  Recent Labs  10/19/2016 1403  INR 1.15     Recent Labs  10/14/2016 1403 10/30/16 0253  NA 131* 132*  K 4.3 4.1  CL 97* 99*  CO2 24 22  GLUCOSE 114* 86  BUN 43* 40*  CREATININE 2.17* 2.02*  CALCIUM 7.9* 7.6*    Physical Exam: Vitals:   11/02/2016 2108 10/30/16 0428  BP: 110/64 (!) 101/46  Pulse: (!) 104 (!) 109  Resp: 18 18  Temp: 98.2 F (36.8 C) 98.5 F (36.9 C)  SpO2: 94% 94%   General: Alert, uncomfortable with LE movement.  no acute distress.  Interactive and responds appropriately to questions.   Mental status: Alert and Oriented x3 Neurologic: Speech Clear and organized, no gross focal findings or movement disorder appreciated. Respiratory: No cyanosis, no use of accessory musculature Cardiovascular: No pedal edema GI: Abdomen is soft and non-tender, non-distended. Skin: Warm and dry.  No lesions in the area of chief complaint . Extremities: Warm and well perfused w/o edema MUSCULOSKELETAL:  Left lower extremity pain with movement and palpation. No lesions or ecchymosis. Sensation intact distally. EHL, FHL, dorsiflexion, plantar flexion intact. Other extremities are atraumatic with painless ROM and NVI.   Prudencio Burly III PA-C 10/30/2016 6:25 AM

## 2016-10-31 ENCOUNTER — Inpatient Hospital Stay (HOSPITAL_COMMUNITY): Payer: Medicare Other

## 2016-10-31 LAB — GLUCOSE, CAPILLARY
GLUCOSE-CAPILLARY: 97 mg/dL (ref 65–99)
Glucose-Capillary: 80 mg/dL (ref 65–99)
Glucose-Capillary: 101 mg/dL — ABNORMAL HIGH (ref 65–99)
Glucose-Capillary: 131 mg/dL — ABNORMAL HIGH (ref 65–99)

## 2016-10-31 MED ORDER — DEXTROSE-NACL 5-0.9 % IV SOLN
INTRAVENOUS | Status: DC
  Administered 2016-10-31: 10:00:00 via INTRAVENOUS
  Administered 2016-11-01: 1000 mL via INTRAVENOUS
  Administered 2016-11-01 – 2016-11-03 (×4): via INTRAVENOUS
  Administered 2016-11-03 – 2016-11-04 (×2): 1000 mL via INTRAVENOUS
  Administered 2016-11-04 – 2016-11-07 (×5): via INTRAVENOUS

## 2016-10-31 MED ORDER — SODIUM CHLORIDE 0.9 % IV BOLUS (SEPSIS)
250.0000 mL | Freq: Once | INTRAVENOUS | Status: AC
  Administered 2016-10-31: 250 mL via INTRAVENOUS

## 2016-10-31 MED ORDER — LIDOCAINE HCL (PF) 1 % IJ SOLN
INTRAMUSCULAR | Status: AC
  Filled 2016-10-31: qty 10

## 2016-10-31 NOTE — Plan of Care (Signed)
Problem: Safety: Goal: Ability to remain free from injury will improve Outcome: Progressing Pt will be free from falls and injuries during this hospitalization.  Problem: Pain Management: Goal: Pain level will decrease with appropriate interventions Outcome: Progressing Pt's pain rating will be at goal tolerance level prior to discharge.

## 2016-10-31 NOTE — Progress Notes (Signed)
Patient scheduled to have thoracentesis today and unable to give consent due to altered mental status. No answer when calling emergency number on chart. [place] who stated that was the only emergency contact and that the patient does not have family. IR and physician notified.  Markus Jarvis, RN

## 2016-10-31 NOTE — Clinical Social Work Note (Signed)
Clinical Social Work Assessment  Patient Details  Name: Isabel Collins MRN: [id number] Date of Birth: Jan 30, 1941  Date of referral:  10/31/16               Reason for consult:  Discharge Planning                Permission sought to share information with:  Chartered certified accountant granted to share information::  Yes, Verbal Permission Granted  Name::        Agency::  [facility]  Relationship::     Contact Information:     Housing/Transportation Living arrangements for the past 2 months:  [place] of Information:  Patient, Facility Patient Interpreter Needed:  None Criminal Activity/Legal Involvement Pertinent to Current Situation/Hospitalization:  No - Comment as needed Significant Relationships:  Friend Lives with:  Self Do you feel safe going back to the place where you live?  Yes Need for family participation in patient care:  No (Coment)  Care giving concerns:  CSW received consult for possible SNF placement at time of discharge. CSW spoke with patient regarding discharge planning. Patient is a bit disoriented and states she can't remember the name of the SNF she came from. When prompted by CSW, she agreed that she would like to return to [facility] at discharge. CSW to continue to follow and assist with discharge planning needs.   Social Worker assessment / plan:  CSW spoke with patient concerning possibility of returning to rehab at [facility] before returning home.  Employment status:  Retired Nurse, adult, Medicaid In [place] PT Recommendations:  Not assessed at this time [place] / Referral to community resources:  [place]  Patient/Family's Response to care:  Patient recognizes need for rehab before returning home and is agreeable to returning to [facility]. She states she has friends but no family and does not know any contact information for anyone. Patient reports that  she lived at an apartment before she went to SNF.   Patient/Family's Understanding of and Emotional Response to Diagnosis, Current Treatment, and Prognosis:  Patient/family is realistic regarding therapy needs and expressed being hopeful for SNF placement. Patient expressed understanding of CSW role and discharge process as well as her medical condition. No questions/concerns about plan or treatment.    Emotional Assessment Appearance:  Appears stated age Attitude/Demeanor/Rapport:  Lethargic Affect (typically observed):  Accepting, Appropriate, Pleasant Orientation:  Oriented to Self, Oriented to Place Alcohol / Substance use:  Not Applicable Psych involvement (Current and /or in the community):  No (Comment)  Discharge Needs  Concerns to be addressed:  Care Coordination Readmission within the last 30 days:  Yes Current discharge risk:  Cognitively Impaired Barriers to Discharge:  Continued Medical Work up   [employer], [place] 10/31/2016, 4:20 PM

## 2016-10-31 NOTE — Progress Notes (Signed)
Pt has BP 95/49 and HR of 107. On-call physician paged. Will continue to monitor and implement any new orders.

## 2016-10-31 NOTE — Progress Notes (Signed)
PROGRESS NOTE  Isabel Collins QPY:[id number] DOB: 04-03-1940 DOA: 10/24/2016 PCP: [name], MD    HPI/Recap of past 24 hours:  The patient reports no new complaints.  Assessment/Plan: Principal Problem:   Traumatic closed displaced supracondylar fracture of left femur (HCC) Active Problems:   Hypertension   Physical deconditioning   Hypothyroidism   Chronic obstructive pulmonary disease (HCC)   Type 2 diabetes mellitus with complication, without long-term current use of insulin (HCC)   Urothelial carcinoma of kidney, right (HCC)   Gastroesophageal reflux disease   History of recent fall   Right renal mass   Fracture of knee prosthesis (HCC)   AKI (acute kidney injury) ([place])   UTI due to extended-spectrum beta lactamase (ESBL) producing Escherichia coli   Acute renal failure (ARF) ([place])   Fall   Sepsis due to Escherichia coli (E. 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 Maintain knee immobilizer -Case discussed with ortho Dr [name] in am, planned surgery today cancelled due to bacteremia, tentatively reschedule on Friday with IM nail after a few days of iv abx and repeat blood culture no growth.  Bacteremia - continue current antibiotic regimen.  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 - operation rescheduled.  Encephalopathy: Very lethargic, Only oriented to self on 8/28 am, now sure baseline On aspiration precaution. Speech to see.  Transitional cell carcinoma  Case discussed with urology Dr [name] in am , Planned surgery on 8/30 cancelled Per Dr [name]:    " She was seen in my office on 8/20 and urine culture grew <10K Dipthroids and yeast.  Opted to treat yeast infection with daily fluconazole until surgery. She was also on suppression Macrobid 50mg  QHS.  " "will reschedule it ~2 weeks from the time of her femor repair as discussed with Dr. [name].  She should be kept on appropriate abx until her surgery for the kidney removal."  CT chest  Done on 8/28 pm: "Masslike area soft tissue that appears within the superior right perinephric space, associated with upper abdominal adenopathy. These findings are new from the prior CT. Recommend followup chest CT, with contrast if this patient can tolerate iodinated contrast."  CT chest also showed right pleural effusion: US guided thoracentesis ordered, cytology/cell count/culture ordered.   Last note on chart from urology reviewed.  AKI on CKD II From uti/sepsis, no hydro, she dose has urine retention, foley inserted on 8/28 Expect improvement Renal dosing meds   Elevated lft : likely from sepsis, CMP next am.  HTN: bp borderline low in the setting of sepsis, home med lopressor held since admission, likely able to resume soon .  Borderline DM2, not on meds at home, a1c in 06/2016 was 7. On ssi here  HLD: continue home meds statin.  COPD: no wheezing, denies sob  Hypothyroid: continue synthroid    Code Status: full  Family Communication: patient   Disposition Plan: once cleared for d/c by specialist.   Consultants:  Ortho Dr [name]  Urology Dr [name]  Procedures:  none  Antibiotics:  Meropenem    Objective: BP 98/60   Pulse (!) 123   Temp 98.2 F (36.8  C) (Oral)   Resp 18   Ht 5\' 2"  (1.575 m)   Wt 65.3 kg (144 lb)   SpO2 (!) 88%   BMI 26.34 kg/m   Intake/Output Summary (Last 24 hours) at 10/31/16 1745 Last data filed at 10/31/16 1742  Gross per 24 hour  Intake                0 ml  Output              100 ml  Net             -100 ml   Filed Weights    10/28/2016 1233  Weight: 65.3 kg (144 lb)    Exam:   General:  Frail, thin, pt in nad, alert and awake  Cardiovascular: rrr, no rubs  Respiratory: diminished right base, no wheezing, no rhonchi,   Abdomen: Soft/ND/NT, positive BS  Musculoskeletal: No Edema  Neuro: drowsy, oriented to self only  Data Reviewed: Basic Metabolic Panel:  Recent Labs Lab 10/24/2016 1403 10/30/16 0253  NA 131* 132*  K 4.3 4.1  CL 97* 99*  CO2 24 22  GLUCOSE 114* 86  BUN 43* 40*  CREATININE 2.17* 2.02*  CALCIUM 7.9* 7.6*   Liver Function Tests:  Recent Labs Lab 10/10/2016 1403 10/30/16 0253  AST 50* 44*  ALT 11* 10*  ALKPHOS 115 114  BILITOT 1.1 0.9  PROT 5.7* 5.0*  ALBUMIN 2.1* 1.7*   No results for input(s): LIPASE, AMYLASE in the last 168 hours. No results for input(s): AMMONIA in the last 168 hours. CBC:  Recent Labs Lab 10/18/2016 1403 10/30/16 0253  WBC 19.4* 15.5*  NEUTROABS 15.8*  --   HGB 9.5* 9.3*  HCT 30.1* 30.2*  MCV 84.3 85.6  PLT 271 229   Cardiac Enzymes:   No results for input(s): CKTOTAL, CKMB, CKMBINDEX, TROPONINI in the last 168 hours. BNP (last 3 results) No results for input(s): BNP in the last 8760 hours.  ProBNP (last 3 results) No results for input(s): PROBNP in the last 8760 hours.  CBG:  Recent Labs Lab 10/30/16 1701 10/30/16 2350 10/31/16 0752 10/31/16 1207 10/31/16 1703  GLUCAP 101* 84 80 101* 97    Recent Results (from the past 240 hour(s))  Blood culture (routine x 2)     Status: Abnormal (Preliminary result)   Collection Time: 10/23/2016  2:03 PM  Result Value Ref Range Status   Specimen Description BLOOD PICC LINE RIGHT ARM  Final   Special Requests   Final    BOTTLES DRAWN AEROBIC AND ANAEROBIC Blood Culture adequate volume   Culture  Setup Time   Final    GRAM NEGATIVE RODS IN BOTH AEROBIC AND ANAEROBIC BOTTLES CRITICAL RESULT CALLED TO, READ BACK BY AND VERIFIED WITH: T.EGAN, PHARMD 10/30/16 0715 L.CHAMPION    Culture (A)   Final    ESCHERICHIA COLI SUSCEPTIBILITIES TO FOLLOW Performed at [facility], [place] [address] [place], [place] [address]    Report Status PENDING  Incomplete  Urine culture     Status: Abnormal (Preliminary result)   Collection Time: 10/10/2016  2:03 PM  Result Value Ref Range Status   Specimen Description URINE, RANDOM  Final   Special Requests NONE  Final   Culture (A)  Final    >=100,000 COLONIES/mL ESCHERICHIA COLI REPEATING SENSITIVITIES Performed at [facility], [place] [address], [place], [place] [address]    Report Status PENDING  Incomplete  Blood Culture ID Panel (Reflexed)  Status: Abnormal   Collection Time: 11/01/2016  2:03 PM  Result Value Ref Range Status   Enterococcus species NOT DETECTED NOT DETECTED Final   Listeria monocytogenes NOT DETECTED NOT DETECTED Final   Staphylococcus species NOT DETECTED NOT DETECTED Final   Staphylococcus aureus NOT DETECTED NOT DETECTED Final   Streptococcus species NOT DETECTED NOT DETECTED Final   Streptococcus agalactiae NOT DETECTED NOT DETECTED Final   Streptococcus pneumoniae NOT DETECTED NOT DETECTED Final   Streptococcus pyogenes NOT DETECTED NOT DETECTED Final   Acinetobacter baumannii NOT DETECTED NOT DETECTED Final   Enterobacteriaceae species DETECTED (A) NOT DETECTED Final    Comment: Enterobacteriaceae represent a large family of gram-negative bacteria, not a single organism. CRITICAL RESULT CALLED TO, READ BACK BY AND VERIFIED WITH: T.EGAN, PHARMD 10/30/16 0715 L.CHAMPION    Enterobacter cloacae complex NOT DETECTED NOT DETECTED Final   Escherichia coli DETECTED (A) NOT DETECTED Final    Comment: CRITICAL RESULT CALLED TO, READ BACK BY AND VERIFIED WITH: T.EGAN, PHARMD 10/30/16 0715 L.CHAMPION    Klebsiella oxytoca NOT DETECTED NOT DETECTED Final   Klebsiella pneumoniae NOT DETECTED NOT DETECTED Final   Proteus species NOT DETECTED NOT DETECTED Final   Serratia marcescens NOT DETECTED NOT DETECTED Final    Carbapenem resistance NOT DETECTED NOT DETECTED Final   Haemophilus influenzae NOT DETECTED NOT DETECTED Final   Neisseria meningitidis NOT DETECTED NOT DETECTED Final   Pseudomonas aeruginosa NOT DETECTED NOT DETECTED Final   Candida albicans NOT DETECTED NOT DETECTED Final   Candida glabrata NOT DETECTED NOT DETECTED Final   Candida krusei NOT DETECTED NOT DETECTED Final   Candida parapsilosis NOT DETECTED NOT DETECTED Final   Candida tropicalis NOT DETECTED NOT DETECTED Final    Comment: Performed at [facility], [place] [address] [place], [place] [address]  Blood culture (routine x 2)     Status: None (Preliminary result)   Collection Time: 10/20/2016  2:27 PM  Result Value Ref Range Status   Specimen Description BLOOD BLOOD RIGHT HAND  Final   Special Requests IN PEDIATRIC BOTTLE Blood Culture adequate volume  Final   Culture   Final    NO GROWTH 2 DAYS Performed at [facility], [place] [address], [place], [place] [address]    Report Status PENDING  Incomplete  Surgical pcr screen     Status: None   Collection Time: 10/22/2016  9:08 PM  Result Value Ref Range Status   MRSA, PCR NEGATIVE NEGATIVE Final   Staphylococcus aureus NEGATIVE NEGATIVE Final    Comment:        The Xpert SA Assay (FDA approved for NASAL specimens in patients over 58 years of age), is one component of a comprehensive surveillance program.  Test performance has been validated by [facility] for patients greater than or equal to 91 year old. It is not intended to diagnose infection nor to guide or monitor treatment.      Studies: Ct Chest Wo Contrast  Result Date: 10/30/2016 CLINICAL DATA:  Pleural effusion EXAM: CT CHEST WITHOUT CONTRAST TECHNIQUE: Multidetector CT imaging of the chest was performed following the standard protocol without IV contrast. COMPARISON:  Chest radiographs, 10/20/2016. Abdomen pelvis CT, 09/17/2016 FINDINGS: Cardiovascular: Heart is normal in size and  configuration. Mild left coronary artery calcifications. Scattered mild atherosclerotic calcifications along the thoracic aorta and at the origin of the branch vessels. Great vessels normal in caliber. Mediastinum/Nodes: No neck base or axillary masses or adenopathy. No mediastinal or hilar  masses or adenopathy. Trachea is patent. Lungs/Pleura: Moderate size right pleural effusion. Trace left pleural effusion. There is dependent opacity most evident in the right lower lobe adjacent to the pleural effusion, most likely all atelectasis. Component of infection is possible but felt less likely. Milder dependent subsegmental atelectasis is noted in the right upper lobe and left lower lobe. There is a densely calcified healed granuloma adjacent to the right major fissure associated with calcified right hilar lymph nodes reflecting healed granulomatous disease. Subtle changes of centrilobular emphysema are noted in the upper lobes. There are no lung masses or suspicious nodules. There is no evidence of pulmonary edema. No pneumothorax. Upper Abdomen: There is a poorly defined masslike structure, which is incompletely imaged, lying above the right kidney and below the right adrenal gland. This may reflect a portion of the right kidney. It was not evident on the prior CT. There is also poorly defined adenopathy along the gastrohepatic, peripancreatic and very celiac chains. Nodes measure up to 2 cm in short axis. This is new since the prior study. Moderate hiatal hernia, stable from the prior CT. Musculoskeletal: No fracture or acute finding. No osteoblastic or osteolytic lesions. IMPRESSION: 1. Moderate right and trace left pleural effusions. 2. Dependent opacity most evident in the right lower lobe. This is likely all atelectasis. No evidence of pulmonary edema. 3. Healed granulomatous disease with calcified right hilar lymph nodes in a right upper lobe calcified granuloma. 4. Masslike area soft tissue that appears within  the superior right perinephric space, associated with upper abdominal adenopathy. These findings are new from the prior CT. Recommend followup chest CT, with contrast if this patient can tolerate iodinated contrast. Aortic Atherosclerosis (ICD10-I70.0) and Emphysema (ICD10-J43.9). Electronically Signed   By: Lajean Manes M.D.   On: 10/30/2016 20:27    Scheduled Meds: . atorvastatin  40 mg Oral QHS  . cycloSPORINE  1 drop Both Eyes BID  . DULoxetine  30 mg Oral q1800  . DULoxetine  60 mg Oral Daily  . fluconazole  100 mg Oral Daily  . insulin aspart  0-9 Units Subcutaneous TID WC  . levothyroxine  75 mcg Oral QAC breakfast  . mometasone-formoterol  2 puff Inhalation BID  . pantoprazole  40 mg Oral Daily  . pregabalin  150 mg Oral BID    Continuous Infusions: . [START ON 11/02/2016]  ceFAZolin (ANCEF) IV    . dextrose 5 % and 0.9% NaCl 75 mL/hr at 10/31/16 1023  . lactated ringers    . meropenem (MERREM) IV Stopped (10/31/16 1710)     Time spent: > 35 minutes  Velvet Bathe MD [facility] Pager [phone]. If 7PM-7AM, please contact night-coverage at [email or web address], password [facility] 10/31/2016, 5:45 PM  LOS: 2 days

## 2016-10-31 NOTE — Evaluation (Signed)
Clinical/Bedside Swallow Evaluation Patient Details  Name: NIKYAH LACKMAN MRN: [id number] Date of Birth: 1940/06/28  Today's Date: 10/31/2016 Time: SLP Start Time (ACUTE ONLY): 1120 SLP Stop Time (ACUTE ONLY): 1129 SLP Time Calculation (min) (ACUTE ONLY): 9 min  Past Medical History:  Past Medical History:  Diagnosis Date  . Anemia   . Anxiety   . Chronic constipation   . COPD (chronic obstructive pulmonary disease) ([place])   . Degenerative arthritis of spine   . Diverticulosis of colon   . Fibromyalgia   . GERD (gastroesophageal reflux disease)   . Gross hematuria   . Hiatal hernia   . History of chronic bronchitis   . History of colon polyps   . Hyperlipidemia   . Hypertension   . Hypothyroidism   . IBS (irritable bowel syndrome)   . OA (osteoarthritis)    hips  . Renal mass, right    Past Surgical History:  Past Surgical History:  Procedure Laterality Date  . arm surgery Left   . BREAST REDUCTION SURGERY Bilateral   . CHOLECYSTECTOMY    . CYSTOSCOPY WITH BIOPSY Right 08/27/2016   Procedure: CYSTOSCOPY WITH BIOPSY;  Surgeon: Kathie Rhodes, MD;  [facility];  Service: Urology;  Laterality: Right;  . CYSTOSCOPY WITH RETROGRADE PYELOGRAM, URETEROSCOPY AND STENT PLACEMENT Right 08/27/2016   Procedure: CYSTOSCOPY WITH RIGHT RETROGRADE PYELOGRAM, RIGHT URETEROSCOPY AND STENT PLACEMENT;  Surgeon: Kathie Rhodes, MD;  Location: [facility];  Service: Urology;  Laterality: Right;  . REPLACEMENT TOTAL KNEE Left   . TOTAL SHOULDER REPLACEMENT Right   . TUBAL LIGATION     HPI:  Pt is a 76 yo female admitted s/p fall with a traumatic closed displaced supracondylar fracture of left femur pending surgical repair. 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, HH, GERD   Assessment / Plan / Recommendation Clinical Impression  Pt has  slow mastication and oral residue with crackers, but suspect that this is not far from her baseline given condition of her dentition. Pt says she does not eat softer foods PTA, but then also makes comments like, "how can I eat that when I don't have teeth." She also reports being symptomatic from her hiatal hernia. Recommend Dys 3 diet and thin liquids with brief SLP f/u for tolerance versus need for any additional modifications. SLP Visit Diagnosis: Dysphagia, oral phase (R13.11)    Aspiration Risk  Mild aspiration risk    Diet Recommendation Dysphagia 3 (Mech soft);Thin liquid   Liquid Administration via: Cup;Straw Medication Administration: Whole meds with liquid Supervision: Patient able to self feed;Intermittent supervision to cue for compensatory strategies Compensations: Slow rate;Small sips/bites;Minimize environmental distractions;Follow solids with liquid Postural Changes: Seated upright at 90 degrees;Remain upright for at least 30 minutes after po intake    Other  Recommendations Oral Care Recommendations: Oral care BID   Follow up Recommendations None      Frequency and Duration min 2x/week  1 week       Prognosis Prognosis for Safe Diet Advancement: Good      Swallow Study   General HPI: Pt is a 76 yo female admitted s/p fall with a traumatic closed displaced supracondylar fracture of left femur pending surgical repair. 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, HH, GERD Type of Study: Bedside Swallow Evaluation Previous Swallow Assessment: none in chart Diet Prior  to this Study: NPO Temperature Spikes Noted: No Respiratory Status: Nasal cannula History of Recent Intubation: No Behavior/Cognition: Alert;Cooperative;Pleasant mood Oral Care Completed by SLP: No Oral Cavity - Dentition: Poor condition;Missing dentition Vision: Functional for  self-feeding Self-Feeding Abilities: Needs assist Patient Positioning: Upright in bed Baseline Vocal Quality: Normal    Oral/Motor/Sensory Function     Ice Chips Ice chips: Not tested   Thin Liquid Thin Liquid: Within functional limits Presentation: Self Fed;Straw    Nectar Thick Nectar Thick Liquid: Not tested   Honey Thick Honey Thick Liquid: Not tested   Puree Puree: Within functional limits Presentation: Spoon   Solid   GO   Solid: Impaired Oral Phase Functional Implications: Impaired mastication;Oral residue        Germain Osgood 10/31/2016,12:29 PM  Germain Osgood, M.A. CCC-SLP [phone]

## 2016-11-01 ENCOUNTER — Encounter (HOSPITAL_COMMUNITY): Payer: Self-pay

## 2016-11-01 LAB — COMPREHENSIVE METABOLIC PANEL
ALT: 10 U/L — ABNORMAL LOW (ref 14–54)
AST: 37 U/L (ref 15–41)
Albumin: 1.6 g/dL — ABNORMAL LOW (ref 3.5–5.0)
Alkaline Phosphatase: 116 U/L (ref 38–126)
Anion gap: 10 (ref 5–15)
BUN: 39 mg/dL — ABNORMAL HIGH (ref 6–20)
CALCIUM: 7.9 mg/dL — AB (ref 8.9–10.3)
CHLORIDE: 108 mmol/L (ref 101–111)
CO2: 20 mmol/L — ABNORMAL LOW (ref 22–32)
Creatinine, Ser: 1.2 mg/dL — ABNORMAL HIGH (ref 0.44–1.00)
GFR, EST AFRICAN AMERICAN: 49 mL/min — AB (ref >60)
GFR, EST NON AFRICAN AMERICAN: 42 mL/min — AB (ref >60)
GLUCOSE: 151 mg/dL — AB (ref 65–99)
POTASSIUM: 4.2 mmol/L (ref 3.5–5.1)
Sodium: 138 mmol/L (ref 135–145)
TOTAL PROTEIN: 5.1 g/dL — AB (ref 6.5–8.1)
Total Bilirubin: 0.7 mg/dL (ref 0.3–1.2)

## 2016-11-01 LAB — CULTURE, BLOOD (ROUTINE X 2): Special Requests: ADEQUATE

## 2016-11-01 LAB — GLUCOSE, CAPILLARY
GLUCOSE-CAPILLARY: 175 mg/dL — AB (ref 65–99)
GLUCOSE-CAPILLARY: 166 mg/dL — AB (ref 65–99)
GLUCOSE-CAPILLARY: 152 mg/dL — AB (ref 65–99)
GLUCOSE-CAPILLARY: 132 mg/dL — AB (ref 65–99)

## 2016-11-01 LAB — CBC
HCT: 29.5 % — ABNORMAL LOW (ref 36.0–46.0)
Hemoglobin: 9.3 g/dL — ABNORMAL LOW (ref 12.0–15.0)
MCH: 26.5 pg (ref 26.0–34.0)
MCHC: 31.5 g/dL (ref 30.0–36.0)
MCV: 84 fL (ref 78.0–100.0)
PLATELETS: 236 10*3/uL (ref 150–400)
RBC: 3.51 MIL/uL — ABNORMAL LOW (ref 3.87–5.11)
RDW: 17.2 % — AB (ref 11.5–15.5)
WBC: 16.2 10*3/uL — AB (ref 4.0–10.5)

## 2016-11-01 LAB — URINE CULTURE

## 2016-11-01 MED ORDER — GENTAMICIN SULFATE 40 MG/ML IJ SOLN
5.0000 mg/kg | INTRAVENOUS | Status: DC
  Filled 2016-11-01: qty 7

## 2016-11-01 NOTE — Plan of Care (Signed)
Problem: Safety: Goal: Ability to remain free from injury will improve Outcome: Progressing No safety issues noted  Problem: Pain Managment: Goal: General experience of comfort will improve Outcome: Progressing Denies needs for pain medications  Problem: Skin Integrity: Goal: Risk for impaired skin integrity will decrease Outcome: Progressing No skin issues noted  Problem: Activity: Goal: Risk for activity intolerance will decrease Outcome: Not Progressing Does not tolerate movement in the bed well  Problem: Nutrition: Goal: Adequate nutrition will be maintained Outcome: Not Progressing Poor nutrition intact, refused supper  Problem: Bowel/Gastric: Goal: Will not experience complications related to bowel motility Outcome: Progressing No gastric or bowel issues reported

## 2016-11-01 NOTE — Progress Notes (Signed)
Patient is unable to consent for herself for procedures.  PA and floor RN have attempted to obtain consent, however emergency contact is listed as son and he has not been available by phone. Discussed with ordering MD.   Brynda Greathouse, MS RD PA-C 12:20 PM

## 2016-11-01 NOTE — Progress Notes (Signed)
SLP Cancellation Note  Patient Details Name: Isabel Collins MRN: [id number] DOB: [birth year]   Cancelled treatment:       Reason Eval/Treat Not Completed: Fatigue/lethargy limiting ability to participate. Patient arousable but not appropriate for pos despite max cueing. RN made aware. Recommend holding pos until patient becomes fully alert. Will f/u.  Gabriel Rainwater MA, CCC-SLP [phone]    Isabel Collins Meryl 11/01/2016, 10:12 AM

## 2016-11-01 NOTE — Progress Notes (Signed)
Patient alert and oriented to self, lethargic during this shift; has poor appetite; she refused to eat; she was unable to take few sips of tea with her meds. MD aware. Will continue to monitor.

## 2016-11-01 NOTE — Progress Notes (Signed)
PROGRESS NOTE  Isabel Collins RWE:[id number] DOB: 03-12-1940 DOA: 10/28/2016 PCP: [name], MD    HPI/Recap of past 24 hours:  No acute issues reported overnight. IR reporting that they're unable to obtain consent for thoracentesis. I have consulted Education officer, museum  Assessment/Plan: Principal Problem:   Traumatic closed displaced supracondylar fracture of left femur ([place]) Active Problems:   Hypertension   Physical deconditioning   Hypothyroidism   Chronic obstructive pulmonary disease (HCC)   Type 2 diabetes mellitus with complication, without long-term current use of insulin (HCC)   Urothelial carcinoma of kidney, right (HCC)   Gastroesophageal reflux disease   History of recent fall   Right renal mass   Fracture of knee prosthesis (HCC)   AKI (acute kidney injury) ([place])   UTI due to extended-spectrum beta lactamase (ESBL) producing Escherichia coli   Acute renal failure (ARF) ([place])   Fall   Sepsis due to Escherichia coli (E. 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 Maintain knee immobilizer -Case discussed with ortho Dr [name] in am, planned surgery today cancelled due to bacteremia, tentatively reschedule on Friday with IM nail after a few days of iv abx and repeat blood culture no growth.  Bacteremia - continue current antibiotic regimen.  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 - operation rescheduled please refer to urology notes as well as above.  Encephalopathy: Very lethargic, Only oriented to self on 8/28 am, now sure baseline On aspiration precaution.  Speech to see.  Transitional cell carcinoma  Case discussed with urology Dr [name] in am , Planned surgery on 8/30 cancelled Per Dr [name]:  " She was seen in my office on 8/20 and urine culture grew <10K Dipthroids and yeast.  Opted to treat yeast infection with daily fluconazole until surgery. She was also on suppression Macrobid 50mg  QHS.  " "will reschedule it ~2 weeks from the time of her femor repair as discussed with Dr. [name].  She should be kept on appropriate abx until her surgery for the kidney removal."  CT chest  Done on 8/28 pm: "Masslike area soft tissue that appears within the superior right perinephric space, associated with upper abdominal adenopathy. These findings are new from the prior CT. Recommend followup chest CT, with contrast if this patient can tolerate iodinated contrast."  CT chest also showed right pleural effusion: US guided thoracentesis ordered, cytology/cell count/culture ordered.   Last note on chart from urology reviewed.  AKI on CKD II From uti/sepsis, no hydro, she dose has urine retention, foley inserted on 8/28 Expect improvement Renal dosing meds   Elevated lft : likely from sepsis, CMP next am.  HTN: bp borderline low in the setting of sepsis, home med lopressor held since admission, likely able to resume soon .  Borderline DM2, not on meds at home, a1c in 06/2016 was 7. On ssi here  HLD: Stable continue home meds statin.  COPD: Stable no wheezing, denies sob  Hypothyroid: Stable continue synthroid    Code Status: full  Family Communication: patient   Disposition Plan: once cleared for d/c by specialist.   Consultants:  Ortho Dr [name]  Urology Dr [name]  Procedures:  none  Antibiotics:  Meropenem    Objective: BP (!) 108/50 (BP Location: Left Arm)   Pulse (!) 119   Temp 98.7 F (37.1 C)   Resp 16   Ht 5\' 2"  (1.575 m)   Wt 65.3 kg (144 lb)   SpO2 95%   BMI 26.34 kg/m   Intake/Output Summary (Last 24  hours) at 11/01/16 1512 Last data filed at 11/01/16 1002  Gross per 24 hour  Intake              235 ml  Output              500 ml  Net             -265 ml   Filed Weights   10/14/2016 1233  Weight: 65.3 kg (144 lb)    Exam:Exam unchanged when compared to a 29 2018   General:  Frail, thin, pt in nad, alert and awake  Cardiovascular: rrr, no rubs  Respiratory: diminished right base, no wheezing, no rhonchi,   Abdomen: Soft/ND/NT, positive BS  Musculoskeletal: No Edema  Neuro: drowsy, oriented to self only  Data Reviewed: Basic Metabolic Panel:  Recent Labs Lab 10/31/2016 1403 10/30/16 0253 11/01/16 0439  NA 131* 132* 138  K 4.3 4.1 4.2  CL 97* 99* 108  CO2 24 22 20*  GLUCOSE 114* 86 151*  BUN 43* 40* 39*  CREATININE 2.17* 2.02* 1.20*  CALCIUM 7.9* 7.6* 7.9*   Liver Function Tests:  Recent Labs Lab 10/11/2016 1403 10/30/16 0253 11/01/16 0439  AST 50* 44* 37  ALT 11* 10* 10*  ALKPHOS 115 114 116  BILITOT 1.1 0.9 0.7  PROT 5.7* 5.0* 5.1*  ALBUMIN 2.1* 1.7* 1.6*   No results for input(s): LIPASE, AMYLASE in the last 168 hours. No results for input(s): AMMONIA in the last 168 hours. CBC:  Recent Labs Lab 10/19/2016 1403 10/30/16 0253 11/01/16 0439  WBC 19.4* 15.5* 16.2*  NEUTROABS 15.8*  --   --   HGB 9.5* 9.3* 9.3*  HCT 30.1* 30.2* 29.5*  MCV 84.3 85.6 84.0  PLT 271 229 236   Cardiac Enzymes:   No results for input(s): CKTOTAL, CKMB, CKMBINDEX, TROPONINI in the last 168 hours. BNP (last 3 results) No results for input(s): BNP in the last 8760 hours.  ProBNP (last 3 results) No results for input(s): PROBNP in the last 8760 hours.  CBG:  Recent Labs Lab 10/31/16 1207 10/31/16 1703 10/31/16 2202 11/01/16 0759 11/01/16 1208  GLUCAP 101* 97 131* 175* 166*    Recent Results (from the past 240 hour(s))  Blood culture (routine x 2)     Status: Abnormal   Collection Time: 10/24/2016  2:03 PM  Result Value Ref Range Status   Specimen  Description BLOOD PICC LINE RIGHT ARM  Final   Special Requests   Final    BOTTLES DRAWN AEROBIC AND ANAEROBIC Blood Culture adequate volume   Culture  Setup Time   Final    GRAM NEGATIVE RODS IN BOTH AEROBIC AND ANAEROBIC BOTTLES CRITICAL RESULT CALLED TO, READ BACK BY AND VERIFIED WITH: T.EGAN, PHARMD 10/30/16 0715 L.CHAMPION    Culture (A)  Final    ESCHERICHIA COLI Confirmed Extended Spectrum Beta-Lactamase Producer (ESBL) Performed at [facility], [place] [address], [place], [place] [address]    Report Status 11/01/2016 FINAL  Final   Organism ID, Bacteria ESCHERICHIA COLI  Final      Susceptibility   Escherichia coli -  MIC*    AMPICILLIN >=32 RESISTANT Resistant     CEFAZOLIN >=64 RESISTANT Resistant     CEFEPIME 32 RESISTANT Resistant     CEFTAZIDIME 16 INTERMEDIATE Intermediate     CEFTRIAXONE >=64 RESISTANT Resistant     CIPROFLOXACIN >=4 RESISTANT Resistant     GENTAMICIN <=1 SENSITIVE Sensitive     IMIPENEM <=0.25 SENSITIVE Sensitive     TRIMETH/SULFA >=320 RESISTANT Resistant     AMPICILLIN/SULBACTAM >=32 RESISTANT Resistant     PIP/TAZO 8 SENSITIVE Sensitive     Extended ESBL POSITIVE Resistant     * ESCHERICHIA COLI  Urine culture     Status: Abnormal   Collection Time: 10/20/2016  2:03 PM  Result Value Ref Range Status   Specimen Description URINE, RANDOM  Final   Special Requests NONE  Final   Culture (A)  Final    >=100,000 COLONIES/mL ESCHERICHIA COLI Confirmed Extended Spectrum Beta-Lactamase Producer (ESBL) Performed at [facility], [place] [address], [place], [place] [address]    Report Status 11/01/2016 FINAL  Final   Organism ID, Bacteria ESCHERICHIA COLI (A)  Final      Susceptibility   Escherichia coli - MIC*    AMPICILLIN >=32 RESISTANT Resistant     CEFAZOLIN >=64 RESISTANT Resistant     CEFTRIAXONE >=64 RESISTANT Resistant     CIPROFLOXACIN >=4 RESISTANT Resistant     GENTAMICIN <=1 SENSITIVE Sensitive     IMIPENEM <=0.25 SENSITIVE  Sensitive     NITROFURANTOIN <=16 SENSITIVE Sensitive     TRIMETH/SULFA >=320 RESISTANT Resistant     AMPICILLIN/SULBACTAM >=32 RESISTANT Resistant     PIP/TAZO 8 SENSITIVE Sensitive     Extended ESBL POSITIVE Resistant     * >=100,000 COLONIES/mL ESCHERICHIA COLI  Blood Culture ID Panel (Reflexed)     Status: Abnormal   Collection Time: 10/23/2016  2:03 PM  Result Value Ref Range Status   Enterococcus species NOT DETECTED NOT DETECTED Final   Listeria monocytogenes NOT DETECTED NOT DETECTED Final   Staphylococcus species NOT DETECTED NOT DETECTED Final   Staphylococcus aureus NOT DETECTED NOT DETECTED Final   Streptococcus species NOT DETECTED NOT DETECTED Final   Streptococcus agalactiae NOT DETECTED NOT DETECTED Final   Streptococcus pneumoniae NOT DETECTED NOT DETECTED Final   Streptococcus pyogenes NOT DETECTED NOT DETECTED Final   Acinetobacter baumannii NOT DETECTED NOT DETECTED Final   Enterobacteriaceae species DETECTED (A) NOT DETECTED Final    Comment: Enterobacteriaceae represent a large family of gram-negative bacteria, not a single organism. CRITICAL RESULT CALLED TO, READ BACK BY AND VERIFIED WITH: T.EGAN, PHARMD 10/30/16 0715 L.CHAMPION    Enterobacter cloacae complex NOT DETECTED NOT DETECTED Final   Escherichia coli DETECTED (A) NOT DETECTED Final    Comment: CRITICAL RESULT CALLED TO, READ BACK BY AND VERIFIED WITH: T.EGAN, PHARMD 10/30/16 0715 L.CHAMPION    Klebsiella oxytoca NOT DETECTED NOT DETECTED Final   Klebsiella pneumoniae NOT DETECTED NOT DETECTED Final   Proteus species NOT DETECTED NOT DETECTED Final   Serratia marcescens NOT DETECTED NOT DETECTED Final   Carbapenem resistance NOT DETECTED NOT DETECTED Final   Haemophilus influenzae NOT DETECTED NOT DETECTED Final   Neisseria meningitidis NOT DETECTED NOT DETECTED Final   Pseudomonas aeruginosa NOT DETECTED NOT DETECTED Final   Candida albicans NOT DETECTED NOT DETECTED Final   Candida glabrata NOT  DETECTED NOT DETECTED Final   Candida krusei NOT DETECTED NOT DETECTED Final   Candida parapsilosis NOT DETECTED NOT DETECTED Final   Candida  tropicalis NOT DETECTED NOT DETECTED Final    Comment: Performed at [facility], [place] [address], [place], [place] [address]  Blood culture (routine x 2)     Status: None (Preliminary result)   Collection Time: 10/23/2016  2:27 PM  Result Value Ref Range Status   Specimen Description BLOOD BLOOD RIGHT HAND  Final   Special Requests IN PEDIATRIC BOTTLE Blood Culture adequate volume  Final   Culture   Final    NO GROWTH 3 DAYS Performed at [facility], [place] [address] [place], [place] [address]    Report Status PENDING  Incomplete  Surgical pcr screen     Status: None   Collection Time: 10/31/2016  9:08 PM  Result Value Ref Range Status   MRSA, PCR NEGATIVE NEGATIVE Final   Staphylococcus aureus NEGATIVE NEGATIVE Final    Comment:        The Xpert SA Assay (FDA approved for NASAL specimens in patients over 38 years of age), is one component of a comprehensive surveillance program.  Test performance has been validated by [facility] for patients greater than or equal to 42 year old. It is not intended to diagnose infection nor to guide or monitor treatment.   Culture, blood (routine x 2)     Status: None (Preliminary result)   Collection Time: 10/31/16  5:30 AM  Result Value Ref Range Status   Specimen Description BLOOD LEFT ASSIST CONTROL  Final   Special Requests IN PEDIATRIC BOTTLE Blood Culture adequate volume  Final   Culture NO GROWTH 1 DAY  Final   Report Status PENDING  Incomplete  Culture, blood (routine x 2)     Status: None (Preliminary result)   Collection Time: 10/31/16  5:41 AM  Result Value Ref Range Status   Specimen Description BLOOD LEFT HAND  Final   Special Requests IN PEDIATRIC BOTTLE Blood Culture adequate volume  Final   Culture NO GROWTH 1 DAY  Final   Report Status PENDING  Incomplete      Studies: No results found.  Scheduled Meds: . atorvastatin  40 mg Oral QHS  . cycloSPORINE  1 drop Both Eyes BID  . DULoxetine  30 mg Oral q1800  . DULoxetine  60 mg Oral Daily  . fluconazole  100 mg Oral Daily  . insulin aspart  0-9 Units Subcutaneous TID WC  . levothyroxine  75 mcg Oral QAC breakfast  . mometasone-formoterol  2 puff Inhalation BID  . pantoprazole  40 mg Oral Daily  . pregabalin  150 mg Oral BID    Continuous Infusions: . [START ON 11/02/2016]  ceFAZolin (ANCEF) IV    . dextrose 5 % and 0.9% NaCl 75 mL/hr at 11/01/16 0458  . [START ON 11/02/2016] gentamicin    . lactated ringers    . meropenem (MERREM) IV Stopped (11/01/16 0528)     Time spent: > 35 minutes  Velvet Bathe MD [facility] Pager [phone]. If 7PM-7AM, please contact night-coverage at [email or web address], password [facility] 11/01/2016, 3:12 PM  LOS: 3 days

## 2016-11-01 NOTE — Progress Notes (Addendum)
Many attempts to call the patient's contact person to obtain consent for Thoracentesis. Staff called Mr. Daryel Gerald who is on our records with no success This AM patient unable to sign the consent because she is disoriented. Will continue to monitor.

## 2016-11-02 LAB — GLUCOSE, CAPILLARY
GLUCOSE-CAPILLARY: 143 mg/dL — AB (ref 65–99)
GLUCOSE-CAPILLARY: 142 mg/dL — AB (ref 65–99)
GLUCOSE-CAPILLARY: 118 mg/dL — AB (ref 65–99)
Glucose-Capillary: 160 mg/dL — ABNORMAL HIGH (ref 65–99)

## 2016-11-02 NOTE — Progress Notes (Signed)
SLP Cancellation Note  Patient Details Name: Isabel Collins MRN: [id number] DOB: 05-May-1940   Cancelled treatment:       Reason Eval/Treat Not Completed: Fatigue/lethargy limiting ability to participate  Pt woke only briefly, did not answer questions and quickly returned to sleep.  Will continue efforts.   [name], [place] [facility] SLP [phone]

## 2016-11-02 NOTE — Progress Notes (Signed)
Assessment / Plan:    Principal Problem:   Traumatic closed displaced supracondylar fracture of left femur (HCC) Active Problems:   Hypertension   Physical deconditioning   Hypothyroidism   Chronic obstructive pulmonary disease (HCC)   Type 2 diabetes mellitus with complication, without long-term current use of insulin (HCC)   Urothelial carcinoma of kidney, right (HCC)   Gastroesophageal reflux disease   History of recent fall   Right renal mass   Fracture of knee prosthesis (HCC)   AKI (acute kidney injury) ([place])   UTI due to extended-spectrum beta lactamase (ESBL) producing Escherichia coli   Acute renal failure (ARF) ([place])   Fall   Sepsis due to Escherichia coli (E. coli) ([place])   Bacteremia due to Gram-negative bacteria   Sepsis secondary to UTI ([place])   Transitional cell carcinoma (HCC)  Closed displaced left supracondylar femur fracture  Multiple medical comorbidities including ESBL bacteremia in the setting of R Upper pole transitional cell carcinoma.    Most recent blood Cx NGTD - 3 days  Pain seems to be controlled.  Hgb has been stable, plts wnl.  The patient remains tachycardic and lethargic and only intermittently interactive / responsive to questions  Plan  Nonweightbearing left lower extremity. Maintain knee immobilizer.  IM retrograde nail would be surgical option, but fitness to undergo same in question due to above medical issues.    Objective:   VITALS:   Vitals:   11/01/16 1417 11/01/16 1959 11/01/16 2025 11/02/16 0628  BP: (!) 108/50  (!) 110/55 (!) 109/93  Pulse: (!) 119  (!) 112 (!) 109  Resp: 16  16 16   Temp: 98.7 F (37.1 C)  98.2 F (36.8 C) 98.7 F (37.1 C)  TempSrc:   Oral Oral  SpO2: 95% 92% 95% 97%  Weight:      Height:       CBC Latest Ref Rng & Units 11/01/2016 10/30/2016 10/07/2016  WBC 4.0 - 10.5 K/uL 16.2(H) 15.5(H) 19.4(H)  Hemoglobin 12.0 - 15.0 g/dL 9.3(L) 9.3(L) 9.5(L)  Hematocrit 36.0 - 46.0 % 29.5(L) 30.2(L) 30.1(L)   Platelets 150 - 400 K/uL 236 229 271   BMP Latest Ref Rng & Units 11/01/2016 10/30/2016 10/19/2016  Glucose 65 - 99 mg/dL 151(H) 86 114(H)  BUN 6 - 20 mg/dL 39(H) 40(H) 43(H)  Creatinine 0.44 - 1.00 mg/dL 1.20(H) 2.02(H) 2.17(H)  BUN/Creat Ratio 12 - 28 - - -  Sodium 135 - 145 mmol/L 138 132(L) 131(L)  Potassium 3.5 - 5.1 mmol/L 4.2 4.1 4.3  Chloride 101 - 111 mmol/L 108 99(L) 97(L)  CO2 22 - 32 mmol/L 20(L) 22 24  Calcium 8.9 - 10.3 mg/dL 7.9(L) 7.6(L) 7.9(L)   Intake/Output      08/30 0701 - 08/31 0700 08/31 0701 - 09/01 0700   P.O. 110    I.V. (mL/kg) 672.5 (10.3)    IV Piggyback 100    Total Intake(mL/kg) 882.5 (13.5)    Urine (mL/kg/hr) 510 (0.3)    Total Output 510     Net +372.5            Physical Exam: General: NAD.  Lethargic.  Does not make eye contact.  Intermittently responds to questions.  Oriented to person.  No increased wob. MSK Left lower extremity immobilizer in place.  Pain with movement and palpation. No lesions or ecchymosis. Foot warm.  Dorsiflexion / plantar flexion intact.   Other extremities are atraumatic with painless ROM and NVI.  Prudencio Burly III, PA-C 11/02/2016,  7:43 AM

## 2016-11-02 NOTE — Progress Notes (Signed)
  Speech Language Pathology Treatment:    Patient Details Name: Isabel Collins MRN: [id number] DOB: Aug 20, 1940 Today's Date: 11/02/2016 Time: 0821-0850 SLP Time Calculation (min) (ACUTE ONLY): 29 min  Assessment / Plan / Recommendation Clinical Impression  Pt participative after 2nd attempt to help with breakfast.  Skilled intervention included facilitating effective compensation strategies including using puree to aid oral clearance of solids.  Minimal intake observed including few sips of milk, 2 tsps orange juice, single bolus of french toast and 2 boluses of oatmeal.  Pt with prolonged mastication of french toast and oral residuals with decreased awareness.  Use of oatmeal effective to help clear.  No indication of aspiration at this time.  Pt is very weak and can not feed herself.  Recommend consider OT to help with ADLs.    Recommend downgrade diet to dys2/thin to maximize efficiency.  Thanks.   Note await possible surgical repair per ortho and ? Thoracentesis - per radiology consent unable to be obtained yesterday.   HPI HPI: Pt is a 76 yo female admitted s/p fall with a traumatic closed displaced supracondylar fracture of left femur pending surgical repair. 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, HH, GERD      SLP Plan  Continue with current plan of care       Recommendations  Diet recommendations: Dysphagia 2 (fine chop);Thin liquid Liquids provided via: Cup;Straw Medication Administration: Whole meds with puree Supervision: Full supervision/cueing for compensatory strategies;Staff to assist with self feeding Compensations: Slow rate;Small sips/bites;Other (Comment) (use puree to aid oral clearance) Postural Changes and/or Swallow Maneuvers: Seated upright 90 degrees;Upright 30-60 min after meal                Oral Care Recommendations: Oral care  QID Follow up Recommendations: None SLP Visit Diagnosis: Dysphagia, oral phase (R13.11) Plan: Continue with current plan of care       GO                [name] 11/02/2016, 8:59 AM [name], [place] [facility] SLP [phone]

## 2016-11-02 NOTE — Progress Notes (Signed)
CSW has been unable to find family for patient. [place] has no contacts and patient's contact on the facesheet is [employer]. If patient requires a procedure for a life- threatening medical condition, two physicians must sign off on procedure in lieu of family consent.   Isabel Collins LCSWA [phone]

## 2016-11-02 NOTE — Progress Notes (Signed)
PROGRESS NOTE  Isabel Collins UYQ:[id number] DOB: March 16, 1940 DOA: 10/07/2016 PCP: [name], MD    HPI/Recap of past 24 hours:  No acute issues reported overnight. IR reporting that they're unable to obtain consent for thoracentesis. I have consulted social worker who indicates that there is nobody we can obtain consent from. We are only able to obtain consent should 2 physicians agreed that Isabel Collins has condition which requires medical management for life-threatening condition  Assessment/Plan: Principal Problem:   Traumatic closed displaced supracondylar fracture of left femur ([place]) Active Problems:   Hypertension   Physical deconditioning   Hypothyroidism   Chronic obstructive pulmonary disease ([place])   Type 2 diabetes mellitus with complication, without long-term current use of insulin ([facility])   Urothelial carcinoma of kidney, right ([place])   Gastroesophageal reflux disease   History of recent fall   Right renal mass   Fracture of knee prosthesis ([place])   AKI (acute kidney injury) ([place])   UTI due to extended-spectrum beta lactamase (ESBL) producing Escherichia coli   Acute renal failure (ARF) ([place])   Fall   Sepsis due to Escherichia coli (E. 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 Maintain knee immobilizer -Case discussed with ortho Dr [name] in am, planned surgery today cancelled due to bacteremia, tentatively reschedule on Tuesday with IM nail after a few days of iv abx and repeat blood culture no growth.  Bacteremia - Improving on current antibiotic regimen, will continue.  Sepsis presented on admission with ESBL bacteremia/ recurrent ESBL UTI in the setting of right upper pole transitional cell carcinoma pending nephrectomy -She is started on meropenem since admission, will continue, repeat  blood culture to ensure clearance of bacteremia. - operation rescheduled please refer to urology notes as well as above.  Encephalopathy: - Silk abuse. As mentioned before unsure what baseline is.  Transitional cell carcinoma  Case discussed with urology Dr [name] in am , Planned surgery on 8/30 cancelled Per Dr [name]:  " She was seen in my office on 8/20 and urine culture grew <10K Dipthroids and yeast.  Opted to treat yeast infection with daily fluconazole until surgery. She was also on suppression Macrobid 50mg  QHS.  " "will reschedule it ~2 weeks from the time of her femor repair as discussed with Dr. [name].  She should be kept on appropriate abx until her surgery for the kidney removal."  CT chest  Done on 8/28 pm: "Masslike area soft tissue that appears within the superior right perinephric space, associated with upper abdominal adenopathy. These findings are new from the prior CT. Recommend followup chest CT, with contrast if this Isabel Collins can tolerate iodinated contrast."  CT chest also showed right pleural effusion: US guided thoracentesis ordered but unable to obtain secondary to problems with consent, Isabel Collins breathing comfortably with nasal cannula in place as such we'll not push for thoracentesis cytology/cell count/culture ordered.   Last note on chart from urology reviewed.  AKI on CKD II From uti/sepsis, no hydro, she dose has urine retention, foley inserted on 8/28 Reassess serum creatinine tomorrow.  Renal dosing meds  Elevated lft : likely from sepsis, CMP next am.  HTN: bp borderline low in the setting of sepsis, home med lopressor held since admission, likely able to resume soon .  Borderline DM2, not on meds at home, a1c in 06/2016 was  7.  - Continue SSI  HLD: Stable continue home meds statin.  COPD: Stable no wheezing, denies sob  Hypothyroid: Stable continue synthroid    Code Status: full  Family Communication: Isabel Collins   Disposition Plan: once  cleared for d/c by specialist.   Consultants:  Ortho Dr [name]  Urology Dr [name]  Procedures:  none  Antibiotics:  Meropenem    Objective: BP (!) 112/55 (BP Location: Left Arm)   Pulse (!) 103   Temp 98.4 F (36.9 C)   Resp 15   Ht 5\' 2"  (1.575 m)   Wt 65.3 kg (144 lb)   SpO2 93%   BMI 26.34 kg/m   Intake/Output Summary (Last 24 hours) at 11/02/16 1552 Last data filed at 11/02/16 0930  Gross per 24 hour  Intake            852.5 ml  Output              510 ml  Net            342.5 ml   Filed Weights   10/03/2016 1233  Weight: 65.3 kg (144 lb)    Exam:Exam unchanged when compared to 11/01/2016   General:  Frail, thin, pt in nad, alert and awake  Cardiovascular: rrr, no rubs  Respiratory: diminished right base, no wheezing, no rhonchi,   Abdomen: Soft/ND/NT, positive BS  Musculoskeletal: No Edema  Neuro: drowsy, oriented to self only  Data Reviewed: Basic Metabolic Panel:  Recent Labs Lab 10/16/2016 1403 10/30/16 0253 11/01/16 0439  NA 131* 132* 138  K 4.3 4.1 4.2  CL 97* 99* 108  CO2 24 22 20*  GLUCOSE 114* 86 151*  BUN 43* 40* 39*  CREATININE 2.17* 2.02* 1.20*  CALCIUM 7.9* 7.6* 7.9*   Liver Function Tests:  Recent Labs Lab 10/12/2016 1403 10/30/16 0253 11/01/16 0439  AST 50* 44* 37  ALT 11* 10* 10*  ALKPHOS 115 114 116  BILITOT 1.1 0.9 0.7  PROT 5.7* 5.0* 5.1*  ALBUMIN 2.1* 1.7* 1.6*   No results for input(s): LIPASE, AMYLASE in the last 168 hours. No results for input(s): AMMONIA in the last 168 hours. CBC:  Recent Labs Lab 10/14/2016 1403 10/30/16 0253 11/01/16 0439  WBC 19.4* 15.5* 16.2*  NEUTROABS 15.8*  --   --   HGB 9.5* 9.3* 9.3*  HCT 30.1* 30.2* 29.5*  MCV 84.3 85.6 84.0  PLT 271 229 236   Cardiac Enzymes:   No results for input(s): CKTOTAL, CKMB, CKMBINDEX, TROPONINI in the last 168 hours. BNP (last 3 results) No results for input(s): BNP in the last 8760 hours.  ProBNP (last 3 results) No results for  input(s): PROBNP in the last 8760 hours.  CBG:  Recent Labs Lab 11/01/16 1208 11/01/16 1703 11/01/16 2054 11/02/16 0801 11/02/16 1159  GLUCAP 166* 152* 132* 160* 143*    Recent Results (from the past 240 hour(s))  Blood culture (routine x 2)     Status: Abnormal   Collection Time: 10/05/2016  2:03 PM  Result Value Ref Range Status   Specimen Description BLOOD PICC LINE RIGHT ARM  Final   Special Requests   Final    BOTTLES DRAWN AEROBIC AND ANAEROBIC Blood Culture adequate volume   Culture  Setup Time   Final    GRAM NEGATIVE RODS IN BOTH AEROBIC AND ANAEROBIC BOTTLES CRITICAL RESULT CALLED TO, READ BACK BY AND VERIFIED WITH: T.EGAN, PHARMD 10/30/16 0715 L.CHAMPION    Culture (A)  Final  ESCHERICHIA COLI Confirmed Extended Spectrum Beta-Lactamase Producer (ESBL) Performed at [facility], [place] [address], [place], [place] [address]    Report Status 11/01/2016 FINAL  Final   Organism ID, Bacteria ESCHERICHIA COLI  Final      Susceptibility   Escherichia coli - MIC*    AMPICILLIN >=32 RESISTANT Resistant     CEFAZOLIN >=64 RESISTANT Resistant     CEFEPIME 32 RESISTANT Resistant     CEFTAZIDIME 16 INTERMEDIATE Intermediate     CEFTRIAXONE >=64 RESISTANT Resistant     CIPROFLOXACIN >=4 RESISTANT Resistant     GENTAMICIN <=1 SENSITIVE Sensitive     IMIPENEM <=0.25 SENSITIVE Sensitive     TRIMETH/SULFA >=320 RESISTANT Resistant     AMPICILLIN/SULBACTAM >=32 RESISTANT Resistant     PIP/TAZO 8 SENSITIVE Sensitive     Extended ESBL POSITIVE Resistant     * ESCHERICHIA COLI  Urine culture     Status: Abnormal   Collection Time: 11/01/2016  2:03 PM  Result Value Ref Range Status   Specimen Description URINE, RANDOM  Final   Special Requests NONE  Final   Culture (A)  Final    >=100,000 COLONIES/mL ESCHERICHIA COLI Confirmed Extended Spectrum Beta-Lactamase Producer (ESBL) Performed at [facility], [place] [address] [place], [place] [address]    Report Status  11/01/2016 FINAL  Final   Organism ID, Bacteria ESCHERICHIA COLI (A)  Final      Susceptibility   Escherichia coli - MIC*    AMPICILLIN >=32 RESISTANT Resistant     CEFAZOLIN >=64 RESISTANT Resistant     CEFTRIAXONE >=64 RESISTANT Resistant     CIPROFLOXACIN >=4 RESISTANT Resistant     GENTAMICIN <=1 SENSITIVE Sensitive     IMIPENEM <=0.25 SENSITIVE Sensitive     NITROFURANTOIN <=16 SENSITIVE Sensitive     TRIMETH/SULFA >=320 RESISTANT Resistant     AMPICILLIN/SULBACTAM >=32 RESISTANT Resistant     PIP/TAZO 8 SENSITIVE Sensitive     Extended ESBL POSITIVE Resistant     * >=100,000 COLONIES/mL ESCHERICHIA COLI  Blood Culture ID Panel (Reflexed)     Status: Abnormal   Collection Time: 10/06/2016  2:03 PM  Result Value Ref Range Status   Enterococcus species NOT DETECTED NOT DETECTED Final   Listeria monocytogenes NOT DETECTED NOT DETECTED Final   Staphylococcus species NOT DETECTED NOT DETECTED Final   Staphylococcus aureus NOT DETECTED NOT DETECTED Final   Streptococcus species NOT DETECTED NOT DETECTED Final   Streptococcus agalactiae NOT DETECTED NOT DETECTED Final   Streptococcus pneumoniae NOT DETECTED NOT DETECTED Final   Streptococcus pyogenes NOT DETECTED NOT DETECTED Final   Acinetobacter baumannii NOT DETECTED NOT DETECTED Final   Enterobacteriaceae species DETECTED (A) NOT DETECTED Final    Comment: Enterobacteriaceae represent a large family of gram-negative bacteria, not a single organism. CRITICAL RESULT CALLED TO, READ BACK BY AND VERIFIED WITH: T.EGAN, PHARMD 10/30/16 0715 L.CHAMPION    Enterobacter cloacae complex NOT DETECTED NOT DETECTED Final   Escherichia coli DETECTED (A) NOT DETECTED Final    Comment: CRITICAL RESULT CALLED TO, READ BACK BY AND VERIFIED WITH: T.EGAN, PHARMD 10/30/16 0715 L.CHAMPION    Klebsiella oxytoca NOT DETECTED NOT DETECTED Final   Klebsiella pneumoniae NOT DETECTED NOT DETECTED Final   Proteus species NOT DETECTED NOT DETECTED Final    Serratia marcescens NOT DETECTED NOT DETECTED Final   Carbapenem resistance NOT DETECTED NOT DETECTED Final   Haemophilus influenzae NOT DETECTED NOT DETECTED Final   Neisseria meningitidis NOT DETECTED NOT  DETECTED Final   Pseudomonas aeruginosa NOT DETECTED NOT DETECTED Final   Candida albicans NOT DETECTED NOT DETECTED Final   Candida glabrata NOT DETECTED NOT DETECTED Final   Candida krusei NOT DETECTED NOT DETECTED Final   Candida parapsilosis NOT DETECTED NOT DETECTED Final   Candida tropicalis NOT DETECTED NOT DETECTED Final    Comment: Performed at [facility], [place] [address], [place], [place] [address]  Blood culture (routine x 2)     Status: None (Preliminary result)   Collection Time: 10/09/2016  2:27 PM  Result Value Ref Range Status   Specimen Description BLOOD BLOOD RIGHT HAND  Final   Special Requests IN PEDIATRIC BOTTLE Blood Culture adequate volume  Final   Culture   Final    NO GROWTH 4 DAYS Performed at [facility], [place] [address], [place], [place] [address]    Report Status PENDING  Incomplete  Surgical pcr screen     Status: None   Collection Time: 10/25/2016  9:08 PM  Result Value Ref Range Status   MRSA, PCR NEGATIVE NEGATIVE Final   Staphylococcus aureus NEGATIVE NEGATIVE Final    Comment:        The Xpert SA Assay (FDA approved for NASAL specimens in patients over 49 years of age), is one component of a comprehensive surveillance program.  Test performance has been validated by [facility] for patients greater than or equal to 29 year old. It is not intended to diagnose infection nor to guide or monitor treatment.   Culture, blood (routine x 2)     Status: None (Preliminary result)   Collection Time: 10/31/16  5:30 AM  Result Value Ref Range Status   Specimen Description BLOOD LEFT ASSIST CONTROL  Final   Special Requests IN PEDIATRIC BOTTLE Blood Culture adequate volume  Final   Culture NO GROWTH 2 DAYS  Final   Report Status PENDING   Incomplete  Culture, blood (routine x 2)     Status: None (Preliminary result)   Collection Time: 10/31/16  5:41 AM  Result Value Ref Range Status   Specimen Description BLOOD LEFT HAND  Final   Special Requests IN PEDIATRIC BOTTLE Blood Culture adequate volume  Final   Culture NO GROWTH 2 DAYS  Final   Report Status PENDING  Incomplete     Studies: No results found.  Scheduled Meds: . atorvastatin  40 mg Oral QHS  . cycloSPORINE  1 drop Both Eyes BID  . DULoxetine  30 mg Oral q1800  . DULoxetine  60 mg Oral Daily  . fluconazole  100 mg Oral Daily  . insulin aspart  0-9 Units Subcutaneous TID WC  . levothyroxine  75 mcg Oral QAC breakfast  . mometasone-formoterol  2 puff Inhalation BID  . pantoprazole  40 mg Oral Daily  . pregabalin  150 mg Oral BID    Continuous Infusions: .  ceFAZolin (ANCEF) IV    . dextrose 5 % and 0.9% NaCl 75 mL/hr at 11/02/16 0655  . lactated ringers    . meropenem (MERREM) IV Stopped (11/02/16 0610)     Time spent: > 35 minutes  Velvet Bathe MD [facility] Pager [phone]. If 7PM-7AM, please contact night-coverage at [email or web address], password [facility] 11/02/2016, 3:52 PM  LOS: 4 days

## 2016-11-02 NOTE — Progress Notes (Signed)
Pharmacy Antibiotic Note Isabel Collins is a 76 y.o. female admitted on 10/31/2016 with left knee pain/femur fracture and subsequently found to have ESBL E.coli UTI with concomitant bacteremia. Currently on day 4 of meropenem for treatment.    Plan: 1. Continue meropenem 1 gram IV every 12 hours.  2. If pt to require antibiotics until nephro-ureterectomy as per urology notes will likely require IV antibiotics as no PO options available based on c/s for treatment of UTI and bacteremia.    Height: 5\' 2"  (157.5 cm) Weight: 144 lb (65.3 kg) IBW/kg (Calculated) : 50.1  Temp (24hrs), Avg:98.5 F (36.9 C), Min:98.2 F (36.8 C), Max:98.7 F (37.1 C)   Recent Labs Lab 10/18/2016 1403 10/13/2016 1414 10/30/16 0253 11/01/16 0439  WBC 19.4*  --  15.5* 16.2*  CREATININE 2.17*  --  2.02* 1.20*  LATICACIDVEN  --  1.34  --   --     Estimated Creatinine Clearance: 34.8 mL/min (A) (by C-G formula based on SCr of 1.2 mg/dL (H)).    No Known Allergies  Antimicrobials this admission: . 8/27 meropenem >>   *Daily fluconazole per urology was grew yeast as outpt   Microbiology results: 8/29 BCx: ngtd 8/27 BCx: ESBL E.coli 8/27 UCx: ESBL. E.coli  8/27 MRSA PCR: neg  Thank you for allowing pharmacy to be a part of this patient's care.  Vincenza Hews, PharmD, BCPS 11/02/2016, 10:10 AM

## 2016-11-03 LAB — CBC
HEMATOCRIT: 30.4 % — AB (ref 36.0–46.0)
Hemoglobin: 9.6 g/dL — ABNORMAL LOW (ref 12.0–15.0)
MCH: 26.4 pg (ref 26.0–34.0)
MCHC: 31.6 g/dL (ref 30.0–36.0)
MCV: 83.5 fL (ref 78.0–100.0)
PLATELETS: 154 10*3/uL (ref 150–400)
RBC: 3.64 MIL/uL — AB (ref 3.87–5.11)
RDW: 17.4 % — ABNORMAL HIGH (ref 11.5–15.5)
WBC: 16.1 10*3/uL — AB (ref 4.0–10.5)

## 2016-11-03 LAB — CULTURE, BLOOD (ROUTINE X 2)
Culture: NO GROWTH
SPECIAL REQUESTS: ADEQUATE

## 2016-11-03 LAB — BASIC METABOLIC PANEL
ANION GAP: 11 (ref 5–15)
BUN: 31 mg/dL — ABNORMAL HIGH (ref 6–20)
CALCIUM: 8 mg/dL — AB (ref 8.9–10.3)
CO2: 15 mmol/L — ABNORMAL LOW (ref 22–32)
Chloride: 114 mmol/L — ABNORMAL HIGH (ref 101–111)
Creatinine, Ser: 0.94 mg/dL (ref 0.44–1.00)
GFR, EST NON AFRICAN AMERICAN: 57 mL/min — AB (ref >60)
GLUCOSE: 134 mg/dL — AB (ref 65–99)
POTASSIUM: 5.1 mmol/L (ref 3.5–5.1)
Sodium: 140 mmol/L (ref 135–145)

## 2016-11-03 LAB — GLUCOSE, CAPILLARY
GLUCOSE-CAPILLARY: 151 mg/dL — AB (ref 65–99)
GLUCOSE-CAPILLARY: 126 mg/dL — AB (ref 65–99)
GLUCOSE-CAPILLARY: 140 mg/dL — AB (ref 65–99)
Glucose-Capillary: 140 mg/dL — ABNORMAL HIGH (ref 65–99)

## 2016-11-03 MED ORDER — BISACODYL 5 MG PO TBEC
5.0000 mg | DELAYED_RELEASE_TABLET | Freq: Once | ORAL | Status: AC
  Administered 2016-11-03: 5 mg via ORAL
  Filled 2016-11-03: qty 1

## 2016-11-03 NOTE — Progress Notes (Signed)
PROGRESS NOTE  Isabel Collins QAS:[id number] DOB: 1940-05-13 DOA: 10/15/2016 PCP: [name], MD    HPI/Recap of past 24 hours:  No acute issues reported overnight. IR reporting that they're unable to obtain consent for thoracentesis. I have consulted social worker who indicates that there is nobody we can obtain consent from. We are only able to obtain consent should 2 physicians agreed that patient has condition which requires medical management for life-threatening condition  Assessment/Plan: Principal Problem:   Traumatic closed displaced supracondylar fracture of left femur ([place]) Active Problems:   Hypertension   Physical deconditioning   Hypothyroidism   Chronic obstructive pulmonary disease ([place])   Type 2 diabetes mellitus with complication, without long-term current use of insulin ([facility])   Urothelial carcinoma of kidney, right ([place])   Gastroesophageal reflux disease   History of recent fall   Right renal mass   Fracture of knee prosthesis ([place])   AKI (acute kidney injury) ([place])   UTI due to extended-spectrum beta lactamase (ESBL) producing Escherichia coli   Acute renal failure (ARF) ([place])   Fall   Sepsis due to Escherichia coli (E. 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 Maintain knee immobilizer - Case discussed with ortho Dr [name] in am, planned surgery today cancelled due to bacteremia, tentatively reschedule on Tuesday with IM nail after a few days of iv abx and repeat blood culture no growth. - Ortho on board as listed above.  Bacteremia - continue antibiotic regimen  Sepsis presented on admission with ESBL bacteremia/ recurrent ESBL UTI in the setting of right upper pole transitional cell carcinoma pending nephrectomy - continue on meropenem since admission, will continue,  repeat blood culture to ensure clearance of bacteremia. - operation rescheduled please refer to urology notes as well as above.  Encephalopathy: - Could be secondary to infectious etiology but improving.  Transitional cell carcinoma  Case discussed with urology Dr [name] in am , Planned surgery on 8/30 cancelled Per Dr [name]:  " She was seen in my office on 8/20 and urine culture grew <10K Dipthroids and yeast.  Opted to treat yeast infection with daily fluconazole until surgery. She was also on suppression Macrobid 50mg  QHS.  " "will reschedule it ~2 weeks from the time of her femor repair as discussed with Dr. [name].  She should be kept on appropriate abx until her surgery for the kidney removal."  CT chest  Done on 8/28 pm: "Masslike area soft tissue that appears within the superior right perinephric space, associated with upper abdominal adenopathy. These findings are new from the prior CT. Recommend followup chest CT, with contrast if this patient can tolerate iodinated contrast."  CT chest also showed right pleural effusion: US guided thoracentesis ordered but unable to obtain secondary to problems with consent, patient breathing comfortably with nasal cannula in place as such we'll not push for thoracentesis cytology/cell count/culture ordered.   Last note on chart from urology reviewed.  AKI on CKD II From uti/sepsis, no hydro, she dose has urine retention, foley inserted on 8/28 Reassess serum creatinine tomorrow.  Renal dosing meds  Elevated lft : likely from sepsis, CMP next am.  HTN: bp borderline low in the setting of sepsis, home med lopressor held since admission, likely able to resume soon .  Borderline DM2, not on meds at home, a1c  in 06/2016 was 7.  - Continue SSI  HLD: Stable continue home meds statin.  COPD: Stable no wheezing, denies sob  Hypothyroid: Stable continue synthroid    Code Status: full  Family Communication: patient   Disposition Plan:  once cleared for d/c by specialist.   Consultants:  Ortho Dr [name]  Urology Dr [name]  Procedures:  none  Antibiotics:  Meropenem    Objective: BP 119/62 (BP Location: Left Arm)   Pulse (!) 111   Temp 98.4 F (36.9 C) (Oral)   Resp 16   Ht 5\' 2"  (1.575 m)   Wt 65.3 kg (144 lb)   SpO2 96%   BMI 26.34 kg/m   Intake/Output Summary (Last 24 hours) at 11/03/16 1644 Last data filed at 11/03/16 1625  Gross per 24 hour  Intake              370 ml  Output                0 ml  Net              370 ml   Filed Weights   10/04/2016 1233  Weight: 65.3 kg (144 lb)    Exam:Exam unchanged when compared to 11/02/2016   General:  Frail, thin, pt in nad, alert and awake  Cardiovascular: rrr, no rubs  Respiratory: diminished right base, no wheezing, no rhonchi,   Abdomen: Soft/ND/NT, positive BS  Musculoskeletal: No Edema  Neuro: drowsy, oriented to self only  Data Reviewed: Basic Metabolic Panel:  Recent Labs Lab 10/05/2016 1403 10/30/16 0253 11/01/16 0439 11/03/16 0016  NA 131* 132* 138 140  K 4.3 4.1 4.2 5.1  CL 97* 99* 108 114*  CO2 24 22 20* 15*  GLUCOSE 114* 86 151* 134*  BUN 43* 40* 39* 31*  CREATININE 2.17* 2.02* 1.20* 0.94  CALCIUM 7.9* 7.6* 7.9* 8.0*   Liver Function Tests:  Recent Labs Lab 10/23/2016 1403 10/30/16 0253 11/01/16 0439  AST 50* 44* 37  ALT 11* 10* 10*  ALKPHOS 115 114 116  BILITOT 1.1 0.9 0.7  PROT 5.7* 5.0* 5.1*  ALBUMIN 2.1* 1.7* 1.6*   No results for input(s): LIPASE, AMYLASE in the last 168 hours. No results for input(s): AMMONIA in the last 168 hours. CBC:  Recent Labs Lab 10/11/2016 1403 10/30/16 0253 11/01/16 0439 11/03/16 0016  WBC 19.4* 15.5* 16.2* 16.1*  NEUTROABS 15.8*  --   --   --   HGB 9.5* 9.3* 9.3* 9.6*  HCT 30.1* 30.2* 29.5* 30.4*  MCV 84.3 85.6 84.0 83.5  PLT 271 229 236 154   Cardiac Enzymes:   No results for input(s): CKTOTAL, CKMB, CKMBINDEX, TROPONINI in the last 168 hours. BNP (last 3  results) No results for input(s): BNP in the last 8760 hours.  ProBNP (last 3 results) No results for input(s): PROBNP in the last 8760 hours.  CBG:  Recent Labs Lab 11/02/16 1159 11/02/16 1655 11/02/16 2151 11/03/16 0810 11/03/16 1258  GLUCAP 143* 142* 118* 140* 151*    Recent Results (from the past 240 hour(s))  Blood culture (routine x 2)     Status: Abnormal   Collection Time: 10/17/2016  2:03 PM  Result Value Ref Range Status   Specimen Description BLOOD PICC LINE RIGHT ARM  Final   Special Requests   Final    BOTTLES DRAWN AEROBIC AND ANAEROBIC Blood Culture adequate volume   Culture  Setup Time   Final    GRAM NEGATIVE RODS IN  BOTH AEROBIC AND ANAEROBIC BOTTLES CRITICAL RESULT CALLED TO, READ BACK BY AND VERIFIED WITH: T.EGAN, PHARMD 10/30/16 0715 L.CHAMPION    Culture (A)  Final    ESCHERICHIA COLI Confirmed Extended Spectrum Beta-Lactamase Producer (ESBL) Performed at [facility], [place] [address] [place], [place] [address]    Report Status 11/01/2016 FINAL  Final   Organism ID, Bacteria ESCHERICHIA COLI  Final      Susceptibility   Escherichia coli - MIC*    AMPICILLIN >=32 RESISTANT Resistant     CEFAZOLIN >=64 RESISTANT Resistant     CEFEPIME 32 RESISTANT Resistant     CEFTAZIDIME 16 INTERMEDIATE Intermediate     CEFTRIAXONE >=64 RESISTANT Resistant     CIPROFLOXACIN >=4 RESISTANT Resistant     GENTAMICIN <=1 SENSITIVE Sensitive     IMIPENEM <=0.25 SENSITIVE Sensitive     TRIMETH/SULFA >=320 RESISTANT Resistant     AMPICILLIN/SULBACTAM >=32 RESISTANT Resistant     PIP/TAZO 8 SENSITIVE Sensitive     Extended ESBL POSITIVE Resistant     * ESCHERICHIA COLI  Urine culture     Status: Abnormal   Collection Time: 10/27/2016  2:03 PM  Result Value Ref Range Status   Specimen Description URINE, RANDOM  Final   Special Requests NONE  Final   Culture (A)  Final    >=100,000 COLONIES/mL ESCHERICHIA COLI Confirmed Extended Spectrum Beta-Lactamase Producer  (ESBL) Performed at [facility], [place] [address], [place], [place] [address]    Report Status 11/01/2016 FINAL  Final   Organism ID, Bacteria ESCHERICHIA COLI (A)  Final      Susceptibility   Escherichia coli - MIC*    AMPICILLIN >=32 RESISTANT Resistant     CEFAZOLIN >=64 RESISTANT Resistant     CEFTRIAXONE >=64 RESISTANT Resistant     CIPROFLOXACIN >=4 RESISTANT Resistant     GENTAMICIN <=1 SENSITIVE Sensitive     IMIPENEM <=0.25 SENSITIVE Sensitive     NITROFURANTOIN <=16 SENSITIVE Sensitive     TRIMETH/SULFA >=320 RESISTANT Resistant     AMPICILLIN/SULBACTAM >=32 RESISTANT Resistant     PIP/TAZO 8 SENSITIVE Sensitive     Extended ESBL POSITIVE Resistant     * >=100,000 COLONIES/mL ESCHERICHIA COLI  Blood Culture ID Panel (Reflexed)     Status: Abnormal   Collection Time: 10/22/2016  2:03 PM  Result Value Ref Range Status   Enterococcus species NOT DETECTED NOT DETECTED Final   Listeria monocytogenes NOT DETECTED NOT DETECTED Final   Staphylococcus species NOT DETECTED NOT DETECTED Final   Staphylococcus aureus NOT DETECTED NOT DETECTED Final   Streptococcus species NOT DETECTED NOT DETECTED Final   Streptococcus agalactiae NOT DETECTED NOT DETECTED Final   Streptococcus pneumoniae NOT DETECTED NOT DETECTED Final   Streptococcus pyogenes NOT DETECTED NOT DETECTED Final   Acinetobacter baumannii NOT DETECTED NOT DETECTED Final   Enterobacteriaceae species DETECTED (A) NOT DETECTED Final    Comment: Enterobacteriaceae represent a large family of gram-negative bacteria, not a single organism. CRITICAL RESULT CALLED TO, READ BACK BY AND VERIFIED WITH: T.EGAN, PHARMD 10/30/16 0715 L.CHAMPION    Enterobacter cloacae complex NOT DETECTED NOT DETECTED Final   Escherichia coli DETECTED (A) NOT DETECTED Final    Comment: CRITICAL RESULT CALLED TO, READ BACK BY AND VERIFIED WITH: T.EGAN, PHARMD 10/30/16 0715 L.CHAMPION    Klebsiella oxytoca NOT DETECTED NOT DETECTED Final    Klebsiella pneumoniae NOT DETECTED NOT DETECTED Final   Proteus species NOT DETECTED NOT DETECTED Final   Serratia marcescens  NOT DETECTED NOT DETECTED Final   Carbapenem resistance NOT DETECTED NOT DETECTED Final   Haemophilus influenzae NOT DETECTED NOT DETECTED Final   Neisseria meningitidis NOT DETECTED NOT DETECTED Final   Pseudomonas aeruginosa NOT DETECTED NOT DETECTED Final   Candida albicans NOT DETECTED NOT DETECTED Final   Candida glabrata NOT DETECTED NOT DETECTED Final   Candida krusei NOT DETECTED NOT DETECTED Final   Candida parapsilosis NOT DETECTED NOT DETECTED Final   Candida tropicalis NOT DETECTED NOT DETECTED Final    Comment: Performed at [facility], [place] [address], [place], [place] [address]  Blood culture (routine x 2)     Status: None   Collection Time: 10/09/2016  2:27 PM  Result Value Ref Range Status   Specimen Description BLOOD BLOOD RIGHT HAND  Final   Special Requests IN PEDIATRIC BOTTLE Blood Culture adequate volume  Final   Culture   Final    NO GROWTH 5 DAYS Performed at [facility], [place] [address] [place], [place] [address]    Report Status 11/03/2016 FINAL  Final  Surgical pcr screen     Status: None   Collection Time: 10/18/2016  9:08 PM  Result Value Ref Range Status   MRSA, PCR NEGATIVE NEGATIVE Final   Staphylococcus aureus NEGATIVE NEGATIVE Final    Comment:        The Xpert SA Assay (FDA approved for NASAL specimens in patients over 35 years of age), is one component of a comprehensive surveillance program.  Test performance has been validated by [facility] for patients greater than or equal to 68 year old. It is not intended to diagnose infection nor to guide or monitor treatment.   Culture, blood (routine x 2)     Status: None (Preliminary result)   Collection Time: 10/31/16  5:30 AM  Result Value Ref Range Status   Specimen Description BLOOD LEFT ASSIST CONTROL  Final   Special Requests IN PEDIATRIC BOTTLE Blood  Culture adequate volume  Final   Culture NO GROWTH 3 DAYS  Final   Report Status PENDING  Incomplete  Culture, blood (routine x 2)     Status: None (Preliminary result)   Collection Time: 10/31/16  5:41 AM  Result Value Ref Range Status   Specimen Description BLOOD LEFT HAND  Final   Special Requests IN PEDIATRIC BOTTLE Blood Culture adequate volume  Final   Culture NO GROWTH 3 DAYS  Final   Report Status PENDING  Incomplete     Studies: No results found.  Scheduled Meds: . atorvastatin  40 mg Oral QHS  . bisacodyl  5 mg Oral Once  . cycloSPORINE  1 drop Both Eyes BID  . DULoxetine  30 mg Oral q1800  . DULoxetine  60 mg Oral Daily  . fluconazole  100 mg Oral Daily  . insulin aspart  0-9 Units Subcutaneous TID WC  . levothyroxine  75 mcg Oral QAC breakfast  . mometasone-formoterol  2 puff Inhalation BID  . pantoprazole  40 mg Oral Daily  . pregabalin  150 mg Oral BID    Continuous Infusions: . dextrose 5 % and 0.9% NaCl 1,000 mL (11/03/16 0805)  . lactated ringers    . meropenem (MERREM) IV Stopped (11/03/16 1655)     Time spent: > 35 minutes  Velvet Bathe MD [facility] Pager [phone]. If 7PM-7AM, please contact night-coverage at [email or web address], password [facility] 11/03/2016, 4:44 PM  LOS: 5 days

## 2016-11-03 DEATH — deceased

## 2016-11-04 LAB — GLUCOSE, CAPILLARY
GLUCOSE-CAPILLARY: 137 mg/dL — AB (ref 65–99)
GLUCOSE-CAPILLARY: 124 mg/dL — AB (ref 65–99)
Glucose-Capillary: 141 mg/dL — ABNORMAL HIGH (ref 65–99)
Glucose-Capillary: 179 mg/dL — ABNORMAL HIGH (ref 65–99)

## 2016-11-04 MED ORDER — BISACODYL 10 MG RE SUPP
10.0000 mg | Freq: Once | RECTAL | Status: AC
  Administered 2016-11-04: 10 mg via RECTAL
  Filled 2016-11-04: qty 1

## 2016-11-04 MED ORDER — SODIUM CHLORIDE 0.9 % IV BOLUS (SEPSIS)
1000.0000 mL | Freq: Once | INTRAVENOUS | Status: AC
  Administered 2016-11-04: 1000 mL via INTRAVENOUS

## 2016-11-04 NOTE — Progress Notes (Signed)
PROGRESS NOTE  JEZREEL SISK FAO:[id number] DOB: 1940-06-16 DOA: 10/24/2016 PCP: [name], MD    HPI/Recap of past 24 hours:  No acute issues reported overnight. IR reporting that they're unable to obtain consent for thoracentesis. I have consulted social worker who indicates that there is nobody we can obtain consent from. We are only able to obtain consent should 2 physicians agreed that patient has condition which requires medical management for life-threatening condition, discussed with ortho in regards to ortho procedure that is upcoming.   Assessment/Plan: Principal Problem:   Traumatic closed displaced supracondylar fracture of left femur (HCC) Active Problems:   Hypertension   Physical deconditioning   Hypothyroidism   Chronic obstructive pulmonary disease (HCC)   Type 2 diabetes mellitus with complication, without long-term current use of insulin (HCC)   Urothelial carcinoma of kidney, right (HCC)   Gastroesophageal reflux disease   History of recent fall   Right renal mass   Fracture of knee prosthesis (HCC)   AKI (acute kidney injury) ([place])   UTI due to extended-spectrum beta lactamase (ESBL) producing Escherichia coli   Acute renal failure (ARF) ([place])   Fall   Sepsis due to Escherichia coli (E. 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 Maintain knee immobilizer - Case discussed with ortho Dr [name] in am, planned surgery today cancelled due to bacteremia, tentatively reschedule on Tuesday with IM nail after a few days of iv abx and repeat blood culture no growth. - Ortho on board as listed above.  Bacteremia - continue antibiotic regimen   Constipation - most likely functional and secondary to pain medication regimen. 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 - operation rescheduled please refer to urology notes as well as above.  Encephalopathy: - Could be secondary to infectious etiology but improving.  Transitional cell carcinoma  Case discussed with urology Dr [name] in am , Planned surgery on 8/30 cancelled Per Dr [name]:  " She was seen in my office on 8/20 and urine culture grew <10K Dipthroids and yeast.  Opted to treat yeast infection with daily fluconazole until surgery. She was also on suppression Macrobid 50mg  QHS.  " "will reschedule it ~2 weeks from the time of her femor repair as discussed with Dr. [name].  She should be kept on appropriate abx until her surgery for the kidney removal."  CT chest  Done on 8/28 pm: "Masslike area soft tissue that appears within the superior right perinephric space, associated with upper abdominal adenopathy. These findings are new from the prior CT. Recommend followup chest CT, with contrast if this patient can tolerate iodinated contrast."  CT chest also showed right pleural effusion: US guided thoracentesis ordered but unable to obtain secondary to problems with consent, patient breathing comfortably with nasal cannula in place as such we'll not push for thoracentesis cytology/cell count/culture ordered.   Last note on chart from urology reviewed.  AKI on CKD II From uti/sepsis, no hydro, she dose has urine retention, foley inserted on 8/28 Reassess serum creatinine tomorrow.  Renal dosing meds  Elevated lft : likely from sepsis, CMP next am.  HTN: bp borderline low in the setting of sepsis, home med lopressor held since admission, likely able to resume soon .  Borderline DM2, not on meds at home, a1c in 06/2016 was 7.  - Continue SSI  HLD: Stable continue  home meds statin.  COPD: Stable no wheezing, denies sob  Hypothyroid: Stable continue synthroid    Code Status: full  Family Communication: patient   Disposition Plan: once cleared for d/c by specialist.   Consultants:  Ortho Dr [name]  Urology Dr [name]  Procedures:  none  Antibiotics:  Meropenem    Objective: BP (!) 111/51 (BP Location: Left Arm)   Pulse (!) 118   Temp 99.9 F (37.7 C) (Oral)   Resp 18   Ht 5\' 2"  (1.575 m)   Wt 65.3 kg (144 lb)   SpO2 94%   BMI 26.34 kg/m   Intake/Output Summary (Last 24 hours) at 11/04/16 1530 Last data filed at 11/04/16 0942  Gross per 24 hour  Intake          1516.25 ml  Output              300 ml  Net          1216.25 ml   Filed Weights   10/05/2016 1233  Weight: 65.3 kg (144 lb)    Exam:Exam unchanged when compared to 11/03/2016   General:  Frail, thin, pt in nad, alert and awake  Cardiovascular: rrr, no rubs  Respiratory: diminished right base, no wheezing, no rhonchi,   Abdomen: Soft/ND/NT, positive BS  Musculoskeletal: No Edema  Neuro: drowsy, oriented to self only  Data Reviewed: Basic Metabolic Panel:  Recent Labs Lab 11/02/2016 1403 10/30/16 0253 11/01/16 0439 11/03/16 0016  NA 131* 132* 138 140  K 4.3 4.1 4.2 5.1  CL 97* 99* 108 114*  CO2 24 22 20* 15*  GLUCOSE 114* 86 151* 134*  BUN 43* 40* 39* 31*  CREATININE 2.17* 2.02* 1.20* 0.94  CALCIUM 7.9* 7.6* 7.9* 8.0*   Liver Function Tests:  Recent Labs Lab 10/28/2016 1403 10/30/16 0253 11/01/16 0439  AST 50* 44* 37  ALT 11* 10* 10*  ALKPHOS 115 114 116  BILITOT 1.1 0.9 0.7  PROT 5.7* 5.0* 5.1*  ALBUMIN 2.1* 1.7* 1.6*   No results for input(s): LIPASE, AMYLASE in the last 168 hours. No results for input(s): AMMONIA in the last 168 hours. CBC:  Recent Labs Lab 10/12/2016 1403 10/30/16 0253 11/01/16 0439 11/03/16 0016  WBC 19.4* 15.5* 16.2* 16.1*  NEUTROABS 15.8*  --   --   --   HGB 9.5* 9.3* 9.3* 9.6*  HCT 30.1* 30.2*  29.5* 30.4*  MCV 84.3 85.6 84.0 83.5  PLT 271 229 236 154   Cardiac Enzymes:   No results for input(s): CKTOTAL, CKMB, CKMBINDEX, TROPONINI in the last 168 hours. BNP (last 3 results) No results for input(s): BNP in the last 8760 hours.  ProBNP (last 3 results) No results for input(s): PROBNP in the last 8760 hours.  CBG:  Recent Labs Lab 11/03/16 1258 11/03/16 1703 11/03/16 2156 11/04/16 0807 11/04/16 1227  GLUCAP 151* 126* 140* 141* 179*    Recent Results (from the past 240 hour(s))  Blood culture (routine x 2)     Status: Abnormal   Collection Time: 10/26/2016  2:03 PM  Result Value Ref Range Status   Specimen Description BLOOD PICC LINE RIGHT ARM  Final   Special Requests   Final    BOTTLES DRAWN AEROBIC AND  ANAEROBIC Blood Culture adequate volume   Culture  Setup Time   Final    GRAM NEGATIVE RODS IN BOTH AEROBIC AND ANAEROBIC BOTTLES CRITICAL RESULT CALLED TO, READ BACK BY AND VERIFIED WITH: T.EGAN, PHARMD 10/30/16 0715 L.CHAMPION    Culture (A)  Final    ESCHERICHIA COLI Confirmed Extended Spectrum Beta-Lactamase Producer (ESBL) Performed at [facility], [place] [address] [place], [place] [address]    Report Status 11/01/2016 FINAL  Final   Organism ID, Bacteria ESCHERICHIA COLI  Final      Susceptibility   Escherichia coli - MIC*    AMPICILLIN >=32 RESISTANT Resistant     CEFAZOLIN >=64 RESISTANT Resistant     CEFEPIME 32 RESISTANT Resistant     CEFTAZIDIME 16 INTERMEDIATE Intermediate     CEFTRIAXONE >=64 RESISTANT Resistant     CIPROFLOXACIN >=4 RESISTANT Resistant     GENTAMICIN <=1 SENSITIVE Sensitive     IMIPENEM <=0.25 SENSITIVE Sensitive     TRIMETH/SULFA >=320 RESISTANT Resistant     AMPICILLIN/SULBACTAM >=32 RESISTANT Resistant     PIP/TAZO 8 SENSITIVE Sensitive     Extended ESBL POSITIVE Resistant     * ESCHERICHIA COLI  Urine culture     Status: Abnormal   Collection Time: 10/03/2016  2:03 PM  Result Value Ref Range Status   Specimen  Description URINE, RANDOM  Final   Special Requests NONE  Final   Culture (A)  Final    >=100,000 COLONIES/mL ESCHERICHIA COLI Confirmed Extended Spectrum Beta-Lactamase Producer (ESBL) Performed at [facility], [place] [address], [place], [place] [address]    Report Status 11/01/2016 FINAL  Final   Organism ID, Bacteria ESCHERICHIA COLI (A)  Final      Susceptibility   Escherichia coli - MIC*    AMPICILLIN >=32 RESISTANT Resistant     CEFAZOLIN >=64 RESISTANT Resistant     CEFTRIAXONE >=64 RESISTANT Resistant     CIPROFLOXACIN >=4 RESISTANT Resistant     GENTAMICIN <=1 SENSITIVE Sensitive     IMIPENEM <=0.25 SENSITIVE Sensitive     NITROFURANTOIN <=16 SENSITIVE Sensitive     TRIMETH/SULFA >=320 RESISTANT Resistant     AMPICILLIN/SULBACTAM >=32 RESISTANT Resistant     PIP/TAZO 8 SENSITIVE Sensitive     Extended ESBL POSITIVE Resistant     * >=100,000 COLONIES/mL ESCHERICHIA COLI  Blood Culture ID Panel (Reflexed)     Status: Abnormal   Collection Time: 10/06/2016  2:03 PM  Result Value Ref Range Status   Enterococcus species NOT DETECTED NOT DETECTED Final   Listeria monocytogenes NOT DETECTED NOT DETECTED Final   Staphylococcus species NOT DETECTED NOT DETECTED Final   Staphylococcus aureus NOT DETECTED NOT DETECTED Final   Streptococcus species NOT DETECTED NOT DETECTED Final   Streptococcus agalactiae NOT DETECTED NOT DETECTED Final   Streptococcus pneumoniae NOT DETECTED NOT DETECTED Final   Streptococcus pyogenes NOT DETECTED NOT DETECTED Final   Acinetobacter baumannii NOT DETECTED NOT DETECTED Final   Enterobacteriaceae species DETECTED (A) NOT DETECTED Final    Comment: Enterobacteriaceae represent a large family of gram-negative bacteria, not a single organism. CRITICAL RESULT CALLED TO, READ BACK BY AND VERIFIED WITH: T.EGAN, PHARMD 10/30/16 0715 L.CHAMPION    Enterobacter cloacae complex NOT DETECTED NOT DETECTED Final   Escherichia coli DETECTED (A) NOT DETECTED  Final    Comment: CRITICAL RESULT CALLED TO, READ BACK BY AND VERIFIED WITH: T.EGAN, PHARMD 10/30/16 0715 L.CHAMPION    Klebsiella oxytoca NOT DETECTED NOT DETECTED Final  Klebsiella pneumoniae NOT DETECTED NOT DETECTED Final   Proteus species NOT DETECTED NOT DETECTED Final   Serratia marcescens NOT DETECTED NOT DETECTED Final   Carbapenem resistance NOT DETECTED NOT DETECTED Final   Haemophilus influenzae NOT DETECTED NOT DETECTED Final   Neisseria meningitidis NOT DETECTED NOT DETECTED Final   Pseudomonas aeruginosa NOT DETECTED NOT DETECTED Final   Candida albicans NOT DETECTED NOT DETECTED Final   Candida glabrata NOT DETECTED NOT DETECTED Final   Candida krusei NOT DETECTED NOT DETECTED Final   Candida parapsilosis NOT DETECTED NOT DETECTED Final   Candida tropicalis NOT DETECTED NOT DETECTED Final    Comment: Performed at [facility], [place] [address] [place], [place] [address]  Blood culture (routine x 2)     Status: None   Collection Time: 10/27/2016  2:27 PM  Result Value Ref Range Status   Specimen Description BLOOD BLOOD RIGHT HAND  Final   Special Requests IN PEDIATRIC BOTTLE Blood Culture adequate volume  Final   Culture   Final    NO GROWTH 5 DAYS Performed at [facility], [place] [address] [place], [place] [address]    Report Status 11/03/2016 FINAL  Final  Surgical pcr screen     Status: None   Collection Time: 10/17/2016  9:08 PM  Result Value Ref Range Status   MRSA, PCR NEGATIVE NEGATIVE Final   Staphylococcus aureus NEGATIVE NEGATIVE Final    Comment:        The Xpert SA Assay (FDA approved for NASAL specimens in patients over 26 years of age), is one component of a comprehensive surveillance program.  Test performance has been validated by [facility] for patients greater than or equal to 12 year old. It is not intended to diagnose infection nor to guide or monitor treatment.   Culture, blood (routine x 2)     Status: None (Preliminary  result)   Collection Time: 10/31/16  5:30 AM  Result Value Ref Range Status   Specimen Description BLOOD LEFT ASSIST CONTROL  Final   Special Requests IN PEDIATRIC BOTTLE Blood Culture adequate volume  Final   Culture NO GROWTH 4 DAYS  Final   Report Status PENDING  Incomplete  Culture, blood (routine x 2)     Status: None (Preliminary result)   Collection Time: 10/31/16  5:41 AM  Result Value Ref Range Status   Specimen Description BLOOD LEFT HAND  Final   Special Requests IN PEDIATRIC BOTTLE Blood Culture adequate volume  Final   Culture NO GROWTH 4 DAYS  Final   Report Status PENDING  Incomplete     Studies: No results found.  Scheduled Meds: . atorvastatin  40 mg Oral QHS  . bisacodyl  10 mg Rectal Once  . cycloSPORINE  1 drop Both Eyes BID  . DULoxetine  30 mg Oral q1800  . DULoxetine  60 mg Oral Daily  . fluconazole  100 mg Oral Daily  . insulin aspart  0-9 Units Subcutaneous TID WC  . levothyroxine  75 mcg Oral QAC breakfast  . mometasone-formoterol  2 puff Inhalation BID  . pantoprazole  40 mg Oral Daily  . pregabalin  150 mg Oral BID    Continuous Infusions: . dextrose 5 % and 0.9% NaCl 1,000 mL (11/04/16 0942)  . lactated ringers    . meropenem (MERREM) IV Stopped (11/04/16 0532)  . sodium chloride       Time spent: > 35 minutes  Velvet Bathe MD [facility]  Pager [phone]. If 7PM-7AM, please contact night-coverage at [email or web address], password [facility] 11/04/2016, 3:30 PM  LOS: 6 days

## 2016-11-04 NOTE — Progress Notes (Signed)
MD made aware about patient's HR being in 120 s and also patient being constipated even after the PRN medicine administered. Per MD order patient received NS 1000 ml bolus (500cc/hr) and Dulcolax supositor. Will continue to monitor.

## 2016-11-05 LAB — CBC
HEMATOCRIT: 31.3 % — AB (ref 36.0–46.0)
HEMOGLOBIN: 9.2 g/dL — AB (ref 12.0–15.0)
MCH: 25.3 pg — AB (ref 26.0–34.0)
MCHC: 29.4 g/dL — AB (ref 30.0–36.0)
MCV: 86.2 fL (ref 78.0–100.0)
Platelets: 140 10*3/uL — ABNORMAL LOW (ref 150–400)
RBC: 3.63 MIL/uL — AB (ref 3.87–5.11)
RDW: 17.9 % — ABNORMAL HIGH (ref 11.5–15.5)
WBC: 17 10*3/uL — ABNORMAL HIGH (ref 4.0–10.5)

## 2016-11-05 LAB — BASIC METABOLIC PANEL
Anion gap: 10 (ref 5–15)
BUN: 25 mg/dL — AB (ref 6–20)
CHLORIDE: 116 mmol/L — AB (ref 101–111)
CO2: 18 mmol/L — AB (ref 22–32)
CREATININE: 1.08 mg/dL — AB (ref 0.44–1.00)
Calcium: 8 mg/dL — ABNORMAL LOW (ref 8.9–10.3)
GFR calc Af Amer: 56 mL/min — ABNORMAL LOW (ref >60)
GFR calc non Af Amer: 48 mL/min — ABNORMAL LOW (ref >60)
GLUCOSE: 144 mg/dL — AB (ref 65–99)
Potassium: 4.2 mmol/L (ref 3.5–5.1)
SODIUM: 144 mmol/L (ref 135–145)

## 2016-11-05 LAB — CULTURE, BLOOD (ROUTINE X 2)
Culture: NO GROWTH
Culture: NO GROWTH
SPECIAL REQUESTS: ADEQUATE
SPECIAL REQUESTS: ADEQUATE

## 2016-11-05 LAB — GLUCOSE, CAPILLARY
GLUCOSE-CAPILLARY: 147 mg/dL — AB (ref 65–99)
Glucose-Capillary: 139 mg/dL — ABNORMAL HIGH (ref 65–99)
Glucose-Capillary: 155 mg/dL — ABNORMAL HIGH (ref 65–99)
Glucose-Capillary: 139 mg/dL — ABNORMAL HIGH (ref 65–99)

## 2016-11-05 MED ORDER — TRAMADOL HCL 50 MG PO TABS
50.0000 mg | ORAL_TABLET | Freq: Four times a day (QID) | ORAL | Status: DC | PRN
  Administered 2016-11-05 – 2016-11-06 (×3): 50 mg via ORAL
  Filled 2016-11-05 (×3): qty 1

## 2016-11-05 MED ORDER — ENSURE ENLIVE PO LIQD
237.0000 mL | Freq: Two times a day (BID) | ORAL | Status: DC
  Administered 2016-11-07 – 2016-11-18 (×12): 237 mL via ORAL

## 2016-11-05 NOTE — Progress Notes (Signed)
Pharmacy Antibiotic Note Isabel Collins is a 76 y.o. female admitted on 10/07/2016 with left knee pain/femur fracture and subsequently found to have ESBL E.coli UTI with concomitant bacteremia. Currently on day 7 of meropenem for treatment.    Plan: 1. Continue meropenem 1 gram IV every 12 hours until 9/4 2. Per Dr. Wendee Beavers keep meropenem on until tomorrow's scheduled femur repair, then will reassess long-term antibiotic plan with ID  3. If pt to require antibiotics until nephro-ureterectomy as per urology notes will likely require IV antibiotics as no PO options available based on c/s for treatment of UTI and bacteremia > could consider ertapenem 1 g q24h    Height: 5\' 2"  (157.5 cm) Weight: 144 lb (65.3 kg) IBW/kg (Calculated) : 50.1  Temp (24hrs), Avg:98.6 F (37 C), Min:97.6 F (36.4 C), Max:99.9 F (37.7 C)   Recent Labs Lab 10/23/2016 1403 10/23/2016 1414 10/30/16 0253 11/01/16 0439 11/03/16 0016 11/05/16 0313  WBC 19.4*  --  15.5* 16.2* 16.1* 17.0*  CREATININE 2.17*  --  2.02* 1.20* 0.94 1.08*  LATICACIDVEN  --  1.34  --   --   --   --     Estimated Creatinine Clearance: 38.7 mL/min (A) (by C-G formula based on SCr of 1.08 mg/dL (H)).    No Known Allergies  Antimicrobials this admission: . 8/27 meropenem >>   . 8/27 vanc x1 *Daily fluconazole per urology as grew yeast as outpt   Microbiology results: 8/29 BCx: ngtd 8/27 BCx: ESBL E.coli 8/27 UCx: ESBL. E.coli  8/27 MRSA PCR: neg  Thank you for allowing pharmacy to be a part of this patient's care.   Diana L. Kyung Rudd, PharmD, [place] PGY1 Pharmacy Resident Pager: [phone]

## 2016-11-05 NOTE — Progress Notes (Signed)
PROGRESS NOTE  Isabel Collins JSE:[id number] DOB: 04/29/1940 DOA: 10/30/2016 PCP: [name], MD    HPI/Recap of past 24 hours:  No acute issues reported overnight. IR reporting that they're unable to obtain consent for thoracentesis. I have consulted social worker who indicates that there is nobody we can obtain consent from. We are only able to obtain consent should 2 physicians agreed that patient has condition which requires medical management for life-threatening condition, discussed with ortho in regards to ortho procedure that is upcoming.   Assessment/Plan: Principal Problem:   Traumatic closed displaced supracondylar fracture of left femur (HCC) Active Problems:   Hypertension   Physical deconditioning   Hypothyroidism   Chronic obstructive pulmonary disease (HCC)   Type 2 diabetes mellitus with complication, without long-term current use of insulin (HCC)   Urothelial carcinoma of kidney, right (HCC)   Gastroesophageal reflux disease   History of recent fall   Right renal mass   Fracture of knee prosthesis (HCC)   AKI (acute kidney injury) ([place])   UTI due to extended-spectrum beta lactamase (ESBL) producing Escherichia coli   Acute renal failure (ARF) ([place])   Fall   Sepsis due to Escherichia coli (E. 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 Maintain knee immobilizer - Case discussed with ortho Dr [name] in am, planned surgery today cancelled due to bacteremia, tentatively reschedule on Tuesday with IM nail after a few days of iv abx and repeat blood culture no growth. - Ortho on board as listed above.  Bacteremia - continue antibiotic regimen   Constipation - most likely functional and secondary to pain medication regimen. 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 - operation rescheduled please refer to urology notes as well as above.  Encephalopathy: - Could be secondary to infectious etiology but suspecting secondary to opiods. Will decrease pain medication regimen today and reassess.   Transitional cell carcinoma  Case discussed with urology Dr [name] in am , Planned surgery on 8/30 cancelled Per Dr [name]:  " She was seen in my office on 8/20 and urine culture grew <10K Dipthroids and yeast.  Opted to treat yeast infection with daily fluconazole until surgery. She was also on suppression Macrobid 50mg  QHS.  " "will reschedule it ~2 weeks from the time of her femor repair as discussed with Dr. [name].  She should be kept on appropriate abx until her surgery for the kidney removal."  CT chest  Done on 8/28 pm: "Masslike area soft tissue that appears within the superior right perinephric space, associated with upper abdominal adenopathy. These findings are new from the prior CT. Recommend followup chest CT, with contrast if this patient can tolerate iodinated contrast."  CT chest also showed right pleural effusion: US guided thoracentesis ordered but unable to obtain secondary to problems with consent, patient breathing comfortably with nasal cannula in place as such we'll not push for thoracentesis cytology/cell count/culture ordered.   Last note on chart from urology reviewed.  AKI on CKD II From uti/sepsis, no hydro, she dose has urine retention, foley inserted on 8/28 Reassess serum creatinine tomorrow.  Renal dosing  meds  Elevated lft :trended down and wnl on last check  HTN: bp borderline low in the setting of sepsis, continue to monitor  Borderline DM2, not on meds at home, a1c in 06/2016 was 7.  - Continue  SSI  HLD: Stable continue home meds statin.  COPD: Stable no wheezing, denies sob  Hypothyroid: Stable continue synthroid    Code Status: full  Family Communication: patient   Disposition Plan: once cleared for d/c by specialist.   Consultants:  Ortho Dr [name]  Urology Dr [name]  Procedures:  none  Antibiotics:  Meropenem    Objective: BP 98/65 (BP Location: Left Arm)   Pulse (!) 126   Temp 98 F (36.7 C) (Oral)   Resp 18   Ht 5\' 2"  (1.575 m)   Wt 65.3 kg (144 lb)   SpO2 95%   BMI 26.34 kg/m   Intake/Output Summary (Last 24 hours) at 11/05/16 1530 Last data filed at 11/05/16 1500  Gross per 24 hour  Intake           2662.5 ml  Output              250 ml  Net           2412.5 ml   Filed Weights   10/07/2016 1233  Weight: 65.3 kg (144 lb)    Exam:Exam unchanged when compared to 11/04/2016   General:  Frail, thin, pt in nad, alert and awake, somnolent  Cardiovascular: rrr, no rubs  Respiratory: diminished right base, no wheezing, no rhonchi,   Abdomen: Soft/ND/NT, positive BS  Musculoskeletal: No Edema  Neuro: drowsy, oriented to self only  Data Reviewed: Basic Metabolic Panel:  Recent Labs Lab 10/30/16 0253 11/01/16 0439 11/03/16 0016 11/05/16 0313  NA 132* 138 140 144  K 4.1 4.2 5.1 4.2  CL 99* 108 114* 116*  CO2 22 20* 15* 18*  GLUCOSE 86 151* 134* 144*  BUN 40* 39* 31* 25*  CREATININE 2.02* 1.20* 0.94 1.08*  CALCIUM 7.6* 7.9* 8.0* 8.0*   Liver Function Tests:  Recent Labs Lab 10/30/16 0253 11/01/16 0439  AST 44* 37  ALT 10* 10*  ALKPHOS 114 116  BILITOT 0.9 0.7  PROT 5.0* 5.1*  ALBUMIN 1.7* 1.6*   No results for input(s): LIPASE, AMYLASE in the last 168 hours. No results for input(s): AMMONIA in the last 168 hours. CBC:  Recent Labs Lab 10/30/16 0253 11/01/16 0439 11/03/16 0016 11/05/16 0313  WBC 15.5* 16.2* 16.1* 17.0*  HGB 9.3* 9.3* 9.6* 9.2*  HCT 30.2* 29.5* 30.4* 31.3*  MCV 85.6 84.0 83.5 86.2    PLT 229 236 154 140*   Cardiac Enzymes:   No results for input(s): CKTOTAL, CKMB, CKMBINDEX, TROPONINI in the last 168 hours. BNP (last 3 results) No results for input(s): BNP in the last 8760 hours.  ProBNP (last 3 results) No results for input(s): PROBNP in the last 8760 hours.  CBG:  Recent Labs Lab 11/04/16 1227 11/04/16 1839 11/04/16 2112 11/05/16 0810 11/05/16 1227  GLUCAP 179* 137* 124* 139* 155*    Recent Results (from the past 240 hour(s))  Blood culture (routine x 2)     Status: Abnormal   Collection Time: 10/06/2016  2:03 PM  Result Value Ref Range Status   Specimen Description BLOOD PICC LINE RIGHT ARM  Final   Special Requests   Final    BOTTLES DRAWN AEROBIC AND ANAEROBIC Blood Culture adequate volume   Culture  Setup Time   Final  GRAM NEGATIVE RODS IN BOTH AEROBIC AND ANAEROBIC BOTTLES CRITICAL RESULT CALLED TO, READ BACK BY AND VERIFIED WITH: T.EGAN, PHARMD 10/30/16 0715 L.CHAMPION    Culture (A)  Final    ESCHERICHIA COLI Confirmed Extended Spectrum Beta-Lactamase Producer (ESBL) Performed at [facility], [place] [address], [place], [place] [address]    Report Status 11/01/2016 FINAL  Final   Organism ID, Bacteria ESCHERICHIA COLI  Final      Susceptibility   Escherichia coli - MIC*    AMPICILLIN >=32 RESISTANT Resistant     CEFAZOLIN >=64 RESISTANT Resistant     CEFEPIME 32 RESISTANT Resistant     CEFTAZIDIME 16 INTERMEDIATE Intermediate     CEFTRIAXONE >=64 RESISTANT Resistant     CIPROFLOXACIN >=4 RESISTANT Resistant     GENTAMICIN <=1 SENSITIVE Sensitive     IMIPENEM <=0.25 SENSITIVE Sensitive     TRIMETH/SULFA >=320 RESISTANT Resistant     AMPICILLIN/SULBACTAM >=32 RESISTANT Resistant     PIP/TAZO 8 SENSITIVE Sensitive     Extended ESBL POSITIVE Resistant     * ESCHERICHIA COLI  Urine culture     Status: Abnormal   Collection Time: 10/14/2016  2:03 PM  Result Value Ref Range Status   Specimen Description URINE, RANDOM  Final    Special Requests NONE  Final   Culture (A)  Final    >=100,000 COLONIES/mL ESCHERICHIA COLI Confirmed Extended Spectrum Beta-Lactamase Producer (ESBL) Performed at [facility], [place] [address] [place], [place] [address]    Report Status 11/01/2016 FINAL  Final   Organism ID, Bacteria ESCHERICHIA COLI (A)  Final      Susceptibility   Escherichia coli - MIC*    AMPICILLIN >=32 RESISTANT Resistant     CEFAZOLIN >=64 RESISTANT Resistant     CEFTRIAXONE >=64 RESISTANT Resistant     CIPROFLOXACIN >=4 RESISTANT Resistant     GENTAMICIN <=1 SENSITIVE Sensitive     IMIPENEM <=0.25 SENSITIVE Sensitive     NITROFURANTOIN <=16 SENSITIVE Sensitive     TRIMETH/SULFA >=320 RESISTANT Resistant     AMPICILLIN/SULBACTAM >=32 RESISTANT Resistant     PIP/TAZO 8 SENSITIVE Sensitive     Extended ESBL POSITIVE Resistant     * >=100,000 COLONIES/mL ESCHERICHIA COLI  Blood Culture ID Panel (Reflexed)     Status: Abnormal   Collection Time: 10/31/2016  2:03 PM  Result Value Ref Range Status   Enterococcus species NOT DETECTED NOT DETECTED Final   Listeria monocytogenes NOT DETECTED NOT DETECTED Final   Staphylococcus species NOT DETECTED NOT DETECTED Final   Staphylococcus aureus NOT DETECTED NOT DETECTED Final   Streptococcus species NOT DETECTED NOT DETECTED Final   Streptococcus agalactiae NOT DETECTED NOT DETECTED Final   Streptococcus pneumoniae NOT DETECTED NOT DETECTED Final   Streptococcus pyogenes NOT DETECTED NOT DETECTED Final   Acinetobacter baumannii NOT DETECTED NOT DETECTED Final   Enterobacteriaceae species DETECTED (A) NOT DETECTED Final    Comment: Enterobacteriaceae represent a large family of gram-negative bacteria, not a single organism. CRITICAL RESULT CALLED TO, READ BACK BY AND VERIFIED WITH: T.EGAN, PHARMD 10/30/16 0715 L.CHAMPION    Enterobacter cloacae complex NOT DETECTED NOT DETECTED Final   Escherichia coli DETECTED (A) NOT DETECTED Final    Comment: CRITICAL RESULT  CALLED TO, READ BACK BY AND VERIFIED WITH: T.EGAN, PHARMD 10/30/16 0715 L.CHAMPION    Klebsiella oxytoca NOT DETECTED NOT DETECTED Final   Klebsiella pneumoniae NOT DETECTED NOT DETECTED Final   Proteus species NOT DETECTED NOT DETECTED Final  Serratia marcescens NOT DETECTED NOT DETECTED Final   Carbapenem resistance NOT DETECTED NOT DETECTED Final   Haemophilus influenzae NOT DETECTED NOT DETECTED Final   Neisseria meningitidis NOT DETECTED NOT DETECTED Final   Pseudomonas aeruginosa NOT DETECTED NOT DETECTED Final   Candida albicans NOT DETECTED NOT DETECTED Final   Candida glabrata NOT DETECTED NOT DETECTED Final   Candida krusei NOT DETECTED NOT DETECTED Final   Candida parapsilosis NOT DETECTED NOT DETECTED Final   Candida tropicalis NOT DETECTED NOT DETECTED Final    Comment: Performed at [facility], [place] [address] [place], [place] [address]  Blood culture (routine x 2)     Status: None   Collection Time: 10/11/2016  2:27 PM  Result Value Ref Range Status   Specimen Description BLOOD BLOOD RIGHT HAND  Final   Special Requests IN PEDIATRIC BOTTLE Blood Culture adequate volume  Final   Culture   Final    NO GROWTH 5 DAYS Performed at [facility], [place] [address], [place], [place] [address]    Report Status 11/03/2016 FINAL  Final  Surgical pcr screen     Status: None   Collection Time: 10/15/2016  9:08 PM  Result Value Ref Range Status   MRSA, PCR NEGATIVE NEGATIVE Final   Staphylococcus aureus NEGATIVE NEGATIVE Final    Comment:        The Xpert SA Assay (FDA approved for NASAL specimens in patients over 73 years of age), is one component of a comprehensive surveillance program.  Test performance has been validated by [facility] for patients greater than or equal to 74 year old. It is not intended to diagnose infection nor to guide or monitor treatment.   Culture, blood (routine x 2)     Status: None   Collection Time: 10/31/16  5:30 AM  Result Value  Ref Range Status   Specimen Description BLOOD LEFT ASSIST CONTROL  Final   Special Requests IN PEDIATRIC BOTTLE Blood Culture adequate volume  Final   Culture NO GROWTH 5 DAYS  Final   Report Status 11/05/2016 FINAL  Final  Culture, blood (routine x 2)     Status: None   Collection Time: 10/31/16  5:41 AM  Result Value Ref Range Status   Specimen Description BLOOD LEFT HAND  Final   Special Requests IN PEDIATRIC BOTTLE Blood Culture adequate volume  Final   Culture NO GROWTH 5 DAYS  Final   Report Status 11/05/2016 FINAL  Final     Studies: No results found.  Scheduled Meds: . atorvastatin  40 mg Oral QHS  . cycloSPORINE  1 drop Both Eyes BID  . DULoxetine  30 mg Oral q1800  . DULoxetine  60 mg Oral Daily  . fluconazole  100 mg Oral Daily  . insulin aspart  0-9 Units Subcutaneous TID WC  . levothyroxine  75 mcg Oral QAC breakfast  . mometasone-formoterol  2 puff Inhalation BID  . pantoprazole  40 mg Oral Daily  . pregabalin  150 mg Oral BID    Continuous Infusions: . dextrose 5 % and 0.9% NaCl 100 mL/hr at 11/05/16 1331  . lactated ringers    . meropenem (MERREM) IV Stopped (11/05/16 0456)     Time spent: > 35 minutes  Velvet Bathe MD [facility] Pager [phone]. If 7PM-7AM, please contact night-coverage at [email or web address], password [facility] 11/05/2016, 3:30 PM  LOS: 7 days

## 2016-11-05 NOTE — Progress Notes (Signed)
  Speech Language Pathology Treatment: Dysphagia  Patient Details Name: Isabel Collins MRN: [id number] DOB: 1940/11/13 Today's Date: 11/05/2016 Time: 0623-7628 SLP Time Calculation (min) (ACUTE ONLY): 14 min  Assessment / Plan / Recommendation Clinical Impression  Pt seen to assess po tolerance, education and readiness for dietary advancement.  RN/nurse tech in room with pt - giving pt medicine.  SLP removed pill from pt's left lateral sulci and placed on spoon of puree - pt able to transit and clear.  Nurse tech in room diligently feeding pt and pt is a laborious feeder increasing her aspiration/malnutrition risk. Pt continues with delays in oral transiting - requiring tsps thin liquid to swallow applesauce.  Pt requires total assistance for feeding.  She is alert currently and reporting significant pain - which SLP suspects is distracting her from po consumption.  RN looking to see if pt has pain medication available.  Advised nurse technician to await feeding pt more until after she is more comfortable.  Will downgrade diet to dys2/thin - Given pt ongoing poor po intake, would recommend consider palliative referral to help establish goals of care.    HPI HPI: Pt is a 76 yo female admitted s/p fall with a traumatic closed displaced supracondylar fracture of left femur pending surgical repair. 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, HH, GERD      SLP Plan  Continue with current plan of care       Recommendations  Diet recommendations: Dysphagia 2 (fine chop);Thin liquid Liquids provided via: Cup;Straw Medication Administration: Whole meds with puree Supervision: Full supervision/cueing for compensatory strategies;Staff to assist with self feeding Compensations: Slow rate;Small sips/bites;Other (Comment) (use puree to aid oral clearance) Postural Changes and/or Swallow  Maneuvers: Seated upright 90 degrees;Upright 30-60 min after meal                Oral Care Recommendations: Oral care QID Follow up Recommendations: None SLP Visit Diagnosis: Dysphagia, oral phase (R13.11) Plan: Continue with current plan of care       GO               Isabel Collins, [place] [facility] SLP [phone]  Isabel Collins 11/05/2016, 9:09 AM

## 2016-11-06 ENCOUNTER — Encounter (HOSPITAL_COMMUNITY): Payer: Self-pay | Admitting: Certified Registered Nurse Anesthetist

## 2016-11-06 ENCOUNTER — Inpatient Hospital Stay (HOSPITAL_COMMUNITY): Payer: Medicare Other

## 2016-11-06 ENCOUNTER — Encounter (HOSPITAL_COMMUNITY): Admission: EM | Disposition: E | Payer: Self-pay | Source: Home / Self Care | Attending: Pulmonary Disease

## 2016-11-06 ENCOUNTER — Other Ambulatory Visit: Payer: Self-pay | Admitting: Family Medicine

## 2016-11-06 HISTORY — PX: FEMUR IM NAIL: SHX1597

## 2016-11-06 LAB — BASIC METABOLIC PANEL
Anion gap: 7 (ref 5–15)
BUN: 25 mg/dL — ABNORMAL HIGH (ref 6–20)
CALCIUM: 8 mg/dL — AB (ref 8.9–10.3)
CO2: 17 mmol/L — AB (ref 22–32)
CREATININE: 1.12 mg/dL — AB (ref 0.44–1.00)
Chloride: 122 mmol/L — ABNORMAL HIGH (ref 101–111)
GFR, EST AFRICAN AMERICAN: 53 mL/min — AB (ref >60)
GFR, EST NON AFRICAN AMERICAN: 46 mL/min — AB (ref >60)
GLUCOSE: 139 mg/dL — AB (ref 65–99)
Potassium: 4.4 mmol/L (ref 3.5–5.1)
Sodium: 146 mmol/L — ABNORMAL HIGH (ref 135–145)

## 2016-11-06 LAB — GLUCOSE, CAPILLARY
GLUCOSE-CAPILLARY: 136 mg/dL — AB (ref 65–99)
GLUCOSE-CAPILLARY: 127 mg/dL — AB (ref 65–99)
Glucose-Capillary: 125 mg/dL — ABNORMAL HIGH (ref 65–99)

## 2016-11-06 LAB — CBC
HCT: 31.1 % — ABNORMAL LOW (ref 36.0–46.0)
HEMATOCRIT: 27 % — AB (ref 36.0–46.0)
Hemoglobin: 8 g/dL — ABNORMAL LOW (ref 12.0–15.0)
Hemoglobin: 9.1 g/dL — ABNORMAL LOW (ref 12.0–15.0)
MCH: 25.6 pg — ABNORMAL LOW (ref 26.0–34.0)
MCH: 25.4 pg — AB (ref 26.0–34.0)
MCHC: 29.6 g/dL — AB (ref 30.0–36.0)
MCHC: 29.3 g/dL — AB (ref 30.0–36.0)
MCV: 86.3 fL (ref 78.0–100.0)
MCV: 86.9 fL (ref 78.0–100.0)
PLATELETS: 127 10*3/uL — AB (ref 150–400)
RBC: 3.13 MIL/uL — ABNORMAL LOW (ref 3.87–5.11)
RBC: 3.58 MIL/uL — AB (ref 3.87–5.11)
RDW: 17.9 % — AB (ref 11.5–15.5)
RDW: 17.8 % — ABNORMAL HIGH (ref 11.5–15.5)
WBC: 14.4 10*3/uL — AB (ref 4.0–10.5)
WBC: 17.4 10*3/uL — ABNORMAL HIGH (ref 4.0–10.5)

## 2016-11-06 LAB — CREATININE, SERUM
CREATININE: 1.12 mg/dL — AB (ref 0.44–1.00)
GFR calc non Af Amer: 46 mL/min — ABNORMAL LOW (ref >60)
GFR, EST AFRICAN AMERICAN: 53 mL/min — AB (ref >60)

## 2016-11-06 SURGERY — INSERTION, INTRAMEDULLARY ROD, FEMUR, RETROGRADE
Anesthesia: General | Site: Leg Upper | Laterality: Left

## 2016-11-06 MED ORDER — PHENYLEPHRINE HCL 10 MG/ML IJ SOLN
INTRAVENOUS | Status: DC | PRN
  Administered 2016-11-06: 25 ug/min via INTRAVENOUS

## 2016-11-06 MED ORDER — ROCURONIUM BROMIDE 100 MG/10ML IV SOLN
INTRAVENOUS | Status: DC | PRN
  Administered 2016-11-06: 40 mg via INTRAVENOUS

## 2016-11-06 MED ORDER — PROPOFOL 10 MG/ML IV BOLUS
INTRAVENOUS | Status: DC | PRN
  Administered 2016-11-06: 100 mg via INTRAVENOUS

## 2016-11-06 MED ORDER — PHENYLEPHRINE HCL 10 MG/ML IJ SOLN
INTRAMUSCULAR | Status: DC | PRN
  Administered 2016-11-06: 80 ug via INTRAVENOUS
  Administered 2016-11-06: 40 ug via INTRAVENOUS

## 2016-11-06 MED ORDER — ONDANSETRON HCL 4 MG/2ML IJ SOLN
INTRAMUSCULAR | Status: DC | PRN
  Administered 2016-11-06: 4 mg via INTRAVENOUS

## 2016-11-06 MED ORDER — 0.9 % SODIUM CHLORIDE (POUR BTL) OPTIME
TOPICAL | Status: DC | PRN
  Administered 2016-11-06: 1000 mL

## 2016-11-06 MED ORDER — LACTATED RINGERS IV SOLN: INTRAVENOUS | Status: DC

## 2016-11-06 MED ORDER — PROMETHAZINE HCL 25 MG/ML IJ SOLN: 6.2500 mg | INTRAMUSCULAR | Status: DC | PRN

## 2016-11-06 MED ORDER — ENOXAPARIN SODIUM 40 MG/0.4ML ~~LOC~~ SOLN
40.0000 mg | SUBCUTANEOUS | Status: DC
  Administered 2016-11-07 – 2016-11-08 (×2): 40 mg via SUBCUTANEOUS
  Filled 2016-11-06 (×2): qty 0.4

## 2016-11-06 MED ORDER — SUGAMMADEX SODIUM 200 MG/2ML IV SOLN
INTRAVENOUS | Status: AC
  Filled 2016-11-06: qty 2

## 2016-11-06 MED ORDER — FENTANYL CITRATE (PF) 100 MCG/2ML IJ SOLN
INTRAMUSCULAR | Status: AC
Start: 2016-11-06 — End: 2016-11-07
  Filled 2016-11-06: qty 2

## 2016-11-06 MED ORDER — FENTANYL CITRATE (PF) 100 MCG/2ML IJ SOLN
25.0000 ug | INTRAMUSCULAR | Status: DC | PRN
  Administered 2016-11-06 (×2): 25 ug via INTRAVENOUS

## 2016-11-06 MED ORDER — FENTANYL CITRATE (PF) 250 MCG/5ML IJ SOLN
INTRAMUSCULAR | Status: AC
  Filled 2016-11-06: qty 5

## 2016-11-06 MED ORDER — MEPERIDINE HCL 25 MG/ML IJ SOLN: 6.2500 mg | INTRAMUSCULAR | Status: DC | PRN

## 2016-11-06 MED ORDER — ENOXAPARIN SODIUM 40 MG/0.4ML ~~LOC~~ SOLN: 40.0000 mg | SUBCUTANEOUS | 0 refills | Status: AC

## 2016-11-06 MED ORDER — FENTANYL CITRATE (PF) 100 MCG/2ML IJ SOLN
INTRAMUSCULAR | Status: DC | PRN
  Administered 2016-11-06 (×3): 25 ug via INTRAVENOUS

## 2016-11-06 MED ORDER — LIDOCAINE HCL (CARDIAC) 20 MG/ML IV SOLN
INTRAVENOUS | Status: DC | PRN
  Administered 2016-11-06: 60 mg via INTRAVENOUS

## 2016-11-06 MED ORDER — CEFAZOLIN SODIUM-DEXTROSE 2-3 GM-% IV SOLR
INTRAVENOUS | Status: DC | PRN
  Administered 2016-11-06: 2 g via INTRAVENOUS

## 2016-11-06 MED ORDER — ONDANSETRON HCL 4 MG/2ML IJ SOLN
INTRAMUSCULAR | Status: AC
  Filled 2016-11-06: qty 2

## 2016-11-06 MED ORDER — SUGAMMADEX SODIUM 200 MG/2ML IV SOLN
INTRAVENOUS | Status: DC | PRN
  Administered 2016-11-06: 150 mg via INTRAVENOUS

## 2016-11-06 SURGICAL SUPPLY — 63 items
BANDAGE ACE 4X5 VEL STRL LF (GAUZE/BANDAGES/DRESSINGS) IMPLANT
BANDAGE ACE 6X5 VEL STRL LF (GAUZE/BANDAGES/DRESSINGS) IMPLANT
BANDAGE ESMARK 6X9 LF (GAUZE/BANDAGES/DRESSINGS) IMPLANT
BIT DRILL CALIBRATED 4.3MMX365 (DRILL) ×1 IMPLANT
BIT DRILL CROWE PNT TWST 4.5MM (DRILL) ×1 IMPLANT
BLADE CLIPPER SURG (BLADE) IMPLANT
BNDG COHESIVE 6X5 TAN STRL LF (GAUZE/BANDAGES/DRESSINGS) ×3 IMPLANT
BNDG ELASTIC 6X10 VLCR STRL LF (GAUZE/BANDAGES/DRESSINGS) ×3 IMPLANT
BNDG ESMARK 6X9 LF (GAUZE/BANDAGES/DRESSINGS)
COVER SURGICAL LIGHT HANDLE (MISCELLANEOUS) ×3 IMPLANT
CUFF TOURNIQUET SINGLE 34IN LL (TOURNIQUET CUFF) IMPLANT
DRAPE C-ARM 42X72 X-RAY (DRAPES) ×3 IMPLANT
DRAPE C-ARMOR (DRAPES) ×3 IMPLANT
DRAPE HALF SHEET 40X57 (DRAPES) ×6 IMPLANT
DRAPE IMP U-DRAPE 54X76 (DRAPES) ×9 IMPLANT
DRAPE ORTHO SPLIT 77X108 STRL (DRAPES) ×4
DRAPE SURG ORHT 6 SPLT 77X108 (DRAPES) ×2 IMPLANT
DRAPE U-SHAPE 47X51 STRL (DRAPES) ×3 IMPLANT
DRILL CALIBRATED 4.3MMX365 (DRILL) ×3
DRILL CROWE POINT TWIST 4.5MM (DRILL) ×3
DRSG ADAPTIC 3X8 NADH LF (GAUZE/BANDAGES/DRESSINGS) ×3 IMPLANT
DRSG MEPILEX BORDER 4X4 (GAUZE/BANDAGES/DRESSINGS) ×6 IMPLANT
DRSG MEPILEX BORDER 4X8 (GAUZE/BANDAGES/DRESSINGS) IMPLANT
DURAPREP 26ML APPLICATOR (WOUND CARE) ×3 IMPLANT
ELECT REM PT RETURN 9FT ADLT (ELECTROSURGICAL) ×3
ELECTRODE REM PT RTRN 9FT ADLT (ELECTROSURGICAL) ×1 IMPLANT
GAUZE SPONGE 4X4 12PLY STRL (GAUZE/BANDAGES/DRESSINGS) ×3 IMPLANT
GLOVE BIO SURGEON STRL SZ7.5 (GLOVE) ×6 IMPLANT
GLOVE BIOGEL PI IND STRL 8 (GLOVE) ×2 IMPLANT
GLOVE BIOGEL PI INDICATOR 8 (GLOVE) ×4
GOWN STRL REUS W/ TWL LRG LVL3 (GOWN DISPOSABLE) ×2 IMPLANT
GOWN STRL REUS W/ TWL XL LVL3 (GOWN DISPOSABLE) ×1 IMPLANT
GOWN STRL REUS W/TWL LRG LVL3 (GOWN DISPOSABLE) ×4
GOWN STRL REUS W/TWL XL LVL3 (GOWN DISPOSABLE) ×2
GUIDEWIRE BEAD TIP (WIRE) ×3 IMPLANT
IMMOBILIZER KNEE 22 (SOFTGOODS) ×3 IMPLANT
KIT BASIN OR (CUSTOM PROCEDURE TRAY) ×3 IMPLANT
KIT ROOM TURNOVER OR (KITS) ×3 IMPLANT
MANIFOLD NEPTUNE II (INSTRUMENTS) IMPLANT
NAIL FEM RETRO 9X360 (Nail) ×3 IMPLANT
NEEDLE 22X1 1/2 (OR ONLY) (NEEDLE) IMPLANT
NS IRRIG 1000ML POUR BTL (IV SOLUTION) ×3 IMPLANT
PACK GENERAL/GYN (CUSTOM PROCEDURE TRAY) ×3 IMPLANT
PACK UNIVERSAL I (CUSTOM PROCEDURE TRAY) IMPLANT
PAD ABD 8X10 STRL (GAUZE/BANDAGES/DRESSINGS) ×9 IMPLANT
PAD ARMBOARD 7.5X6 YLW CONV (MISCELLANEOUS) ×6 IMPLANT
PADDING CAST COTTON 6X4 STRL (CAST SUPPLIES) ×6 IMPLANT
SCREW CORT TI DBL LEAD 5X30 (Screw) ×3 IMPLANT
SCREW CORT TI DBL LEAD 5X44 (Screw) ×3 IMPLANT
SCREW CORT TI DBL LEAD 5X56 (Screw) ×3 IMPLANT
SCREW CORT TI DBL LEAD 5X65 (Screw) ×3 IMPLANT
SCREW CORT TI DBL LEAD 5X70 (Screw) ×3 IMPLANT
STOCKINETTE IMPERVIOUS LG (DRAPES) ×3 IMPLANT
SUT ETHILON 3 0 PS 1 (SUTURE) ×9 IMPLANT
SUT MNCRL AB 4-0 PS2 18 (SUTURE) IMPLANT
SUT MON AB 2-0 CT1 27 (SUTURE) ×3 IMPLANT
SUT VIC AB 0 CT1 27 (SUTURE) ×4
SUT VIC AB 0 CT1 27XBRD ANBCTR (SUTURE) ×2 IMPLANT
SUT VIC AB 1 CT1 27 (SUTURE) ×2
SUT VIC AB 1 CT1 27XBRD ANBCTR (SUTURE) ×1 IMPLANT
SYR CONTROL 10ML LL (SYRINGE) IMPLANT
TOWEL OR 17X24 6PK STRL BLUE (TOWEL DISPOSABLE) ×3 IMPLANT
TOWEL OR 17X26 10 PK STRL BLUE (TOWEL DISPOSABLE) ×3 IMPLANT

## 2016-11-06 NOTE — Telephone Encounter (Signed)
Patient has been in the hospital for the past week so certainly does not need a refill currently. It appears that prior to that she was in a nursing home for rehabilitation so likely has plenty of her medications left. Not sure if they are going to change her medications during her hosp. She just had surgical repair of a hip fracture today and so will likely be inpatient for a while. Will discuss refill at her hospital follow-up visit when I know she is discharged on.

## 2016-11-06 NOTE — Progress Notes (Signed)
Report received from [place], [place] on 5 W.

## 2016-11-06 NOTE — Progress Notes (Addendum)
CM received consult:  Question Answer Comment  Reason for consult: Equipment    Home health needs    Medication needs    Other (see comments)       Comments   PT / OT / SLP DME as needed  Pt  Is from SNF PT/OT consult pending .Marland Kitchen..CM to f/u with disposition needs .... CSW made aware , pt is from SNF [facility] place).  Whitman Hero RN,BSN,CM

## 2016-11-06 NOTE — Anesthesia Preprocedure Evaluation (Signed)
Anesthesia Evaluation  Patient identified by MRN, date of birth, ID band Patient awake    Reviewed: Allergy & Precautions, NPO status , Patient's Chart, lab work & pertinent test results, reviewed documented beta blocker date and time   Airway Mallampati: II  TM Distance: >3 FB Neck ROM: Full    Dental  (+) Dental Advisory Given   Pulmonary COPD,  COPD inhaler, former smoker,    breath sounds clear to auscultation       Cardiovascular hypertension, Pt. on medications and Pt. on home beta blockers  Rhythm:Regular Rate:Normal  Echo 10/14/2016 - Left ventricle: The cavity size was normal. Wall thickness was normal. Systolic function was vigorous. The estimated ejection fraction was in the range of 65% to 70%. Wall motion was normal; there were no regional wall motion abnormalities. Doppler parameters are consistent with abnormal left ventricular relaxation (grade 1 diastolic dysfunction).  Impressions: - Hyperdynamic LV systolic function; mild diastolic dysfunction.   Neuro/Psych Anxiety negative neurological ROS     GI/Hepatic Neg liver ROS, hiatal hernia, GERD  Medicated,  Endo/Other  diabetesHypothyroidism   Renal/GU ARFRenal disease     Musculoskeletal  (+) Arthritis , Fibromyalgia -  Abdominal   Peds  Hematology  (+) anemia ,   Anesthesia Other Findings   Reproductive/Obstetrics                             Anesthesia Physical  Anesthesia Plan  ASA: IV  Anesthesia Plan: General   Post-op Pain Management:    Induction: Intravenous  PONV Risk Score and Plan: 4 or greater and Ondansetron, Dexamethasone, Treatment may vary due to age or medical condition and Propofol  Airway Management Planned: Oral ETT  Additional Equipment:   Intra-op Plan:   Post-operative Plan: Extubation in OR  Informed Consent: I have reviewed the patients History and Physical, chart, labs and discussed  the procedure including the risks, benefits and alternatives for the proposed anesthesia with the patient or authorized representative who has indicated his/her understanding and acceptance.   Dental advisory given  Plan Discussed with: CRNA  Anesthesia Plan Comments:        Anesthesia Quick Evaluation

## 2016-11-06 NOTE — Progress Notes (Signed)
PROGRESS NOTE  Isabel Collins:[id number] DOB: [birth year] DOA: 10/23/2016 PCP: [name], MD    HPI/Recap of past 24 hours:  76 y.o. female with medical history significant of COPD, diabetes mellitus, hypertension, hyperlipidemia, recent diagnosis of right upper pole transitional cell carcinoma, seen recently by urology with recent ureteroscopy/biopsy and stent, supposed to have nephrectomy 3 days prior to admission, presents to the emergency room from her skilled nursing home after having a fall and injuring her left knee. Patient was found to have a traumatic closed displaced supracondylar fracture left femur.  Hospital stay complicated by bacteremia  Assessment/Plan: Principal Problem:   Traumatic closed displaced supracondylar fracture of left femur ([place]) Active Problems:   Hypertension   Physical deconditioning   Hypothyroidism   Chronic obstructive pulmonary disease (HCC)   Type 2 diabetes mellitus with complication, without long-term current use of insulin (HCC)   Urothelial carcinoma of kidney, right (HCC)   Gastroesophageal reflux disease   History of recent fall   Right renal mass   Fracture of knee prosthesis (HCC)   AKI (acute kidney injury) ([place])   UTI due to extended-spectrum beta lactamase (ESBL) producing Escherichia coli   Acute renal failure (ARF) ([place])   Fall   Sepsis due to Escherichia coli (E. 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 Maintain knee immobilizer - Case discussed with ortho Dr [name] in am, planned surgery today cancelled due to bacteremia, tentatively reschedule on Tuesday with IM nail after a few days of iv abx and repeat blood culture no growth. - Ortho on board as listed above.  Bacteremia - continue antibiotic regimen with blood cultures growing  gram-negative multidrug resistant Escherichia coli. Patient has subsequently been on meropenem.  Constipation - most likely functional and secondary to pain medication regimen. Improved after Dulcolax suppository  Sepsis presented on admission with multidrug resistant Escherichia coli/ multidrug resistant Escherichia coli UTI in the setting of right upper pole transitional cell carcinoma pending nephrectomy - continue on meropenem since admission, will continue, repeat blood culture negative. - operation rescheduled please refer to urology notes as well as above.  Encephalopathy: - Multifactorial. Suspect combination from infectious etiology and somnolence from opioid pain medication regimen. I have adjusted her pain medication regimen and patient is more alert now.  AKI on CKD II From uti/sepsis, no hydro, she dose has urine retention, foley inserted on 8/28 Serum creatinine stable  Renal dosing meds  Elevated lft :trended down and wnl on last check  HTN: bp borderline low in the setting of sepsis, continue to monitor  Borderline DM2, not on meds at home, a1c in 06/2016 was 7.  - Continue SSI  HLD: Stable continue home meds statin.  COPD: Stable no wheezing, denies sob  Hypothyroid: Stable continue synthroid  Code Status: full  Family Communication: patient   Disposition Plan: once cleared for d/c by specialist.   Consultants:  Ortho Dr [name]  Urology Dr [name]  Procedures:  none  Antibiotics:  Meropenem    Objective: BP (!) 93/55 (BP Location: Left Arm)   Pulse (!) 111   Temp 98.1 F (36.7 C) (Oral)   Resp 16   Ht 5\' 2"  (1.575 m)   Wt 65.3 kg (144 lb)   SpO2 90%   BMI 26.34 kg/m   Intake/Output Summary (Last 24 hours) at  11/10/2016 1833 Last data filed at 11/18/2016 1500  Gross per 24 hour  Intake          3106.67 ml  Output              450 ml  Net          2656.67 ml   Filed Weights   10/23/2016 1233 11/25/2016 0948  Weight: 65.3 kg (144 lb) 65.3  kg (144 lb)    Exam:Exam unchanged when compared to 11/05/2016   General:  Frail, thin, pt in nad, alert and awake, somnolent  Cardiovascular: rrr, no rubs  Respiratory: diminished right base, no wheezing, no rhonchi,   Abdomen: Soft/ND/NT, positive BS  Musculoskeletal: No Edema  Neuro: drowsy, oriented to self only  Data Reviewed: Basic Metabolic Panel:  Recent Labs Lab 11/01/16 0439 11/03/16 0016 11/05/16 0313 11/08/2016 0815 11/13/2016 0821  NA 138 140 144  --  146*  K 4.2 5.1 4.2  --  4.4  CL 108 114* 116*  --  122*  CO2 20* 15* 18*  --  17*  GLUCOSE 151* 134* 144*  --  139*  BUN 39* 31* 25*  --  25*  CREATININE 1.20* 0.94 1.08* 1.12* 1.12*  CALCIUM 7.9* 8.0* 8.0*  --  8.0*   Liver Function Tests:  Recent Labs Lab 11/01/16 0439  AST 37  ALT 10*  ALKPHOS 116  BILITOT 0.7  PROT 5.1*  ALBUMIN 1.6*   No results for input(s): LIPASE, AMYLASE in the last 168 hours. No results for input(s): AMMONIA in the last 168 hours. CBC:  Recent Labs Lab 11/01/16 0439 11/03/16 0016 11/05/16 0313 11/10/2016 0510 11/22/2016 0821  WBC 16.2* 16.1* 17.0* 14.4* 17.4*  HGB 9.3* 9.6* 9.2* 8.0* 9.1*  HCT 29.5* 30.4* 31.3* 27.0* 31.1*  MCV 84.0 83.5 86.2 86.3 86.9  PLT 236 154 140* QUESTIONABLE RESULTS, RECOMMEND RECOLLECT TO VERIFY 127*   Cardiac Enzymes:   No results for input(s): CKTOTAL, CKMB, CKMBINDEX, TROPONINI in the last 168 hours. BNP (last 3 results) No results for input(s): BNP in the last 8760 hours.  ProBNP (last 3 results) No results for input(s): PROBNP in the last 8760 hours.  CBG:  Recent Labs Lab 11/05/16 1227 11/05/16 1659 11/05/16 2153 11/16/2016 0816 11/29/2016 1703  GLUCAP 155* 139* 147* 136* 127*    Recent Results (from the past 240 hour(s))  Blood culture (routine x 2)     Status: Abnormal   Collection Time: 10/08/2016  2:03 PM  Result Value Ref Range Status   Specimen Description BLOOD PICC LINE RIGHT ARM  Final   Special Requests   Final      BOTTLES DRAWN AEROBIC AND ANAEROBIC Blood Culture adequate volume   Culture  Setup Time   Final    GRAM NEGATIVE RODS IN BOTH AEROBIC AND ANAEROBIC BOTTLES CRITICAL RESULT CALLED TO, READ BACK BY AND VERIFIED WITH: T.EGAN, PHARMD 10/30/16 0715 L.CHAMPION    Culture (A)  Final    ESCHERICHIA COLI Confirmed Extended Spectrum Beta-Lactamase Producer (ESBL) Performed at [facility], [place] [address], [place], [place] [address]    Report Status 11/01/2016 FINAL  Final   Organism ID, Bacteria ESCHERICHIA COLI  Final      Susceptibility   Escherichia coli - MIC*    AMPICILLIN >=32 RESISTANT Resistant     CEFAZOLIN >=64 RESISTANT Resistant     CEFEPIME 32 RESISTANT Resistant     CEFTAZIDIME 16 INTERMEDIATE Intermediate     CEFTRIAXONE >=64  RESISTANT Resistant     CIPROFLOXACIN >=4 RESISTANT Resistant     GENTAMICIN <=1 SENSITIVE Sensitive     IMIPENEM <=0.25 SENSITIVE Sensitive     TRIMETH/SULFA >=320 RESISTANT Resistant     AMPICILLIN/SULBACTAM >=32 RESISTANT Resistant     PIP/TAZO 8 SENSITIVE Sensitive     Extended ESBL POSITIVE Resistant     * ESCHERICHIA COLI  Urine culture     Status: Abnormal   Collection Time: 10/23/2016  2:03 PM  Result Value Ref Range Status   Specimen Description URINE, RANDOM  Final   Special Requests NONE  Final   Culture (A)  Final    >=100,000 COLONIES/mL ESCHERICHIA COLI Confirmed Extended Spectrum Beta-Lactamase Producer (ESBL) Performed at [facility], [place] [address], [place], [place] [address]    Report Status 11/01/2016 FINAL  Final   Organism ID, Bacteria ESCHERICHIA COLI (A)  Final      Susceptibility   Escherichia coli - MIC*    AMPICILLIN >=32 RESISTANT Resistant     CEFAZOLIN >=64 RESISTANT Resistant     CEFTRIAXONE >=64 RESISTANT Resistant     CIPROFLOXACIN >=4 RESISTANT Resistant     GENTAMICIN <=1 SENSITIVE Sensitive     IMIPENEM <=0.25 SENSITIVE Sensitive     NITROFURANTOIN <=16 SENSITIVE Sensitive     TRIMETH/SULFA  >=320 RESISTANT Resistant     AMPICILLIN/SULBACTAM >=32 RESISTANT Resistant     PIP/TAZO 8 SENSITIVE Sensitive     Extended ESBL POSITIVE Resistant     * >=100,000 COLONIES/mL ESCHERICHIA COLI  Blood Culture ID Panel (Reflexed)     Status: Abnormal   Collection Time: 10/27/2016  2:03 PM  Result Value Ref Range Status   Enterococcus species NOT DETECTED NOT DETECTED Final   Listeria monocytogenes NOT DETECTED NOT DETECTED Final   Staphylococcus species NOT DETECTED NOT DETECTED Final   Staphylococcus aureus NOT DETECTED NOT DETECTED Final   Streptococcus species NOT DETECTED NOT DETECTED Final   Streptococcus agalactiae NOT DETECTED NOT DETECTED Final   Streptococcus pneumoniae NOT DETECTED NOT DETECTED Final   Streptococcus pyogenes NOT DETECTED NOT DETECTED Final   Acinetobacter baumannii NOT DETECTED NOT DETECTED Final   Enterobacteriaceae species DETECTED (A) NOT DETECTED Final    Comment: Enterobacteriaceae represent a large family of gram-negative bacteria, not a single organism. CRITICAL RESULT CALLED TO, READ BACK BY AND VERIFIED WITH: T.EGAN, PHARMD 10/30/16 0715 L.CHAMPION    Enterobacter cloacae complex NOT DETECTED NOT DETECTED Final   Escherichia coli DETECTED (A) NOT DETECTED Final    Comment: CRITICAL RESULT CALLED TO, READ BACK BY AND VERIFIED WITH: T.EGAN, PHARMD 10/30/16 0715 L.CHAMPION    Klebsiella oxytoca NOT DETECTED NOT DETECTED Final   Klebsiella pneumoniae NOT DETECTED NOT DETECTED Final   Proteus species NOT DETECTED NOT DETECTED Final   Serratia marcescens NOT DETECTED NOT DETECTED Final   Carbapenem resistance NOT DETECTED NOT DETECTED Final   Haemophilus influenzae NOT DETECTED NOT DETECTED Final   Neisseria meningitidis NOT DETECTED NOT DETECTED Final   Pseudomonas aeruginosa NOT DETECTED NOT DETECTED Final   Candida albicans NOT DETECTED NOT DETECTED Final   Candida glabrata NOT DETECTED NOT DETECTED Final   Candida krusei NOT DETECTED NOT DETECTED Final    Candida parapsilosis NOT DETECTED NOT DETECTED Final   Candida tropicalis NOT DETECTED NOT DETECTED Final    Comment: Performed at [facility], [place] [address] [place], [place] [address]  Blood culture (routine x 2)     Status: None   Collection  Time: 10/06/2016  2:27 PM  Result Value Ref Range Status   Specimen Description BLOOD BLOOD RIGHT HAND  Final   Special Requests IN PEDIATRIC BOTTLE Blood Culture adequate volume  Final   Culture   Final    NO GROWTH 5 DAYS Performed at [facility], [place] [address], [place], [place] [address]    Report Status 11/03/2016 FINAL  Final  Surgical pcr screen     Status: None   Collection Time: 10/28/2016  9:08 PM  Result Value Ref Range Status   MRSA, PCR NEGATIVE NEGATIVE Final   Staphylococcus aureus NEGATIVE NEGATIVE Final    Comment:        The Xpert SA Assay (FDA approved for NASAL specimens in patients over 38 years of age), is one component of a comprehensive surveillance program.  Test performance has been validated by [facility] for patients greater than or equal to 53 year old. It is not intended to diagnose infection nor to guide or monitor treatment.   Culture, blood (routine x 2)     Status: None   Collection Time: 10/31/16  5:30 AM  Result Value Ref Range Status   Specimen Description BLOOD LEFT ASSIST CONTROL  Final   Special Requests IN PEDIATRIC BOTTLE Blood Culture adequate volume  Final   Culture NO GROWTH 5 DAYS  Final   Report Status 11/05/2016 FINAL  Final  Culture, blood (routine x 2)     Status: None   Collection Time: 10/31/16  5:41 AM  Result Value Ref Range Status   Specimen Description BLOOD LEFT HAND  Final   Special Requests IN PEDIATRIC BOTTLE Blood Culture adequate volume  Final   Culture NO GROWTH 5 DAYS  Final   Report Status 11/05/2016 FINAL  Final     Studies: Dg C-arm 1-60 Min  Result Date: 11/11/2016 CLINICAL DATA:  Fixation of supracondylar femur fracture. EXAM: DG C-ARM 61-120  MIN; LEFT FEMUR 2 VIEWS COMPARISON:  Radiographs 10/31/2016 FINDINGS: 4 fluoroscopic spot images demonstrate a long intramedullary rod in the femur with 4 distal and 1 proximal interlocking screws. Good position and alignment of the supracondylar femur fracture just above the knee prosthesis. No complicating features. IMPRESSION: Internal fixation of supracondylar femur fracture. Electronically Signed   By: Marijo Sanes M.D.   On: 11/21/2016 12:02   Dg Femur Min 2 Views Left  Result Date: 11/11/2016 CLINICAL DATA:  Fixation of supracondylar femur fracture. EXAM: DG C-ARM 61-120 MIN; LEFT FEMUR 2 VIEWS COMPARISON:  Radiographs 10/08/2016 FINDINGS: 4 fluoroscopic spot images demonstrate a long intramedullary rod in the femur with 4 distal and 1 proximal interlocking screws. Good position and alignment of the supracondylar femur fracture just above the knee prosthesis. No complicating features. IMPRESSION: Internal fixation of supracondylar femur fracture. Electronically Signed   By: Marijo Sanes M.D.   On: 11/16/2016 12:02   Dg Femur Port Min 2 Views Left  Result Date: 11/03/2016 CLINICAL DATA:  LEFT supracondylar femur fracture go this is EXAM: LEFT FEMUR PORTABLE 2 VIEWS COMPARISON:  10/26/2016 FINDINGS: Intramedullary nail fixation of distal supracondylar femur fracture. There are 4 distal cortical locking screws. One proximal locking cortical screw. Improvement in alignment of the fracture. Total knee arthroplasty noted. IMPRESSION: Internal female fixation of distal LEFT supracondylar femur fracture without complication Electronically Signed   By: Suzy Bouchard M.D.   On: 12/02/2016 14:38    Scheduled Meds: . atorvastatin  40 mg Oral QHS  . cycloSPORINE  1 drop Both  Eyes BID  . DULoxetine  30 mg Oral q1800  . DULoxetine  60 mg Oral Daily  . [START ON 11/07/2016] enoxaparin (LOVENOX) injection  40 mg Subcutaneous Q24H  . feeding supplement (ENSURE ENLIVE)  237 mL Oral BID BM  . fentaNYL      .  fluconazole  100 mg Oral Daily  . insulin aspart  0-9 Units Subcutaneous TID WC  . levothyroxine  75 mcg Oral QAC breakfast  . mometasone-formoterol  2 puff Inhalation BID  . pantoprazole  40 mg Oral Daily  . pregabalin  150 mg Oral BID    Continuous Infusions: . dextrose 5 % and 0.9% NaCl 100 mL/hr at 11/05/16 2230  . meropenem (MERREM) IV Stopped (11/29/2016 1825)     Time spent: > 35 minutes  Velvet Bathe MD [facility] Pager [phone]. If 7PM-7AM, please contact night-coverage at [email or web address], password [facility] 11/21/2016, 6:33 PM  LOS: 8 days

## 2016-11-06 NOTE — H&P (View-Only) (Signed)
Assessment / Plan:    Principal Problem:   Traumatic closed displaced supracondylar fracture of left femur (HCC) Active Problems:   Hypertension   Physical deconditioning   Hypothyroidism   Chronic obstructive pulmonary disease (HCC)   Type 2 diabetes mellitus with complication, without long-term current use of insulin (HCC)   Urothelial carcinoma of kidney, right (HCC)   Gastroesophageal reflux disease   History of recent fall   Right renal mass   Fracture of knee prosthesis (HCC)   AKI (acute kidney injury) ([place])   UTI due to extended-spectrum beta lactamase (ESBL) producing Escherichia coli   Acute renal failure (ARF) ([place])   Fall   Sepsis due to Escherichia coli (E. coli) ([place])   Bacteremia due to Gram-negative bacteria   Sepsis secondary to UTI ([place])   Transitional cell carcinoma (HCC)  Closed displaced left supracondylar femur fracture  Multiple medical comorbidities including ESBL bacteremia in the setting of R Upper pole transitional cell carcinoma.    Most recent blood Cx NGTD - 3 days  Pain seems to be controlled.  Hgb has been stable, plts wnl.  The patient remains tachycardic and lethargic and only intermittently interactive / responsive to questions  Plan  Nonweightbearing left lower extremity. Maintain knee immobilizer.  IM retrograde nail would be surgical option, but fitness to undergo same in question due to above medical issues.    Objective:   VITALS:   Vitals:   11/01/16 1417 11/01/16 1959 11/01/16 2025 11/02/16 0628  BP: (!) 108/50  (!) 110/55 (!) 109/93  Pulse: (!) 119  (!) 112 (!) 109  Resp: 16  16 16   Temp: 98.7 F (37.1 C)  98.2 F (36.8 C) 98.7 F (37.1 C)  TempSrc:   Oral Oral  SpO2: 95% 92% 95% 97%  Weight:      Height:       CBC Latest Ref Rng & Units 11/01/2016 10/30/2016 10/12/2016  WBC 4.0 - 10.5 K/uL 16.2(H) 15.5(H) 19.4(H)  Hemoglobin 12.0 - 15.0 g/dL 9.3(L) 9.3(L) 9.5(L)  Hematocrit 36.0 - 46.0 % 29.5(L) 30.2(L) 30.1(L)   Platelets 150 - 400 K/uL 236 229 271   BMP Latest Ref Rng & Units 11/01/2016 10/30/2016 10/08/2016  Glucose 65 - 99 mg/dL 151(H) 86 114(H)  BUN 6 - 20 mg/dL 39(H) 40(H) 43(H)  Creatinine 0.44 - 1.00 mg/dL 1.20(H) 2.02(H) 2.17(H)  BUN/Creat Ratio 12 - 28 - - -  Sodium 135 - 145 mmol/L 138 132(L) 131(L)  Potassium 3.5 - 5.1 mmol/L 4.2 4.1 4.3  Chloride 101 - 111 mmol/L 108 99(L) 97(L)  CO2 22 - 32 mmol/L 20(L) 22 24  Calcium 8.9 - 10.3 mg/dL 7.9(L) 7.6(L) 7.9(L)   Intake/Output      08/30 0701 - 08/31 0700 08/31 0701 - 09/01 0700   P.O. 110    I.V. (mL/kg) 672.5 (10.3)    IV Piggyback 100    Total Intake(mL/kg) 882.5 (13.5)    Urine (mL/kg/hr) 510 (0.3)    Total Output 510     Net +372.5            Physical Exam: General: NAD.  Lethargic.  Does not make eye contact.  Intermittently responds to questions.  Oriented to person.  No increased wob. MSK Left lower extremity immobilizer in place.  Pain with movement and palpation. No lesions or ecchymosis. Foot warm.  Dorsiflexion / plantar flexion intact.   Other extremities are atraumatic with painless ROM and NVI.  Prudencio Burly III, PA-C 11/02/2016,  7:43 AM

## 2016-11-06 NOTE — Op Note (Signed)
10/11/2016 - 11/25/2016  11:47 AM  PATIENT:  Isabel Collins    PRE-OPERATIVE DIAGNOSIS:  femur fracture left  POST-OPERATIVE DIAGNOSIS:  Same  PROCEDURE:  INTRAMEDULLARY (IM) RETROGRADE FEMORAL NAILING  SURGEON:  Antwonette Feliz D, MD  ASSISTANT: Roxan Hockey, PA-C, he was present and scrubbed throughout the case, critical for completion in a timely fashion, and for retraction, instrumentation, and closure.   ANESTHESIA:   Gen  PREOPERATIVE INDICATIONS:  Isabel Collins is a  76 y.o. female with a diagnosis of femur fracture left who failed conservative measures and elected for surgical management.    The risks benefits and alternatives were discussed with the patient preoperatively including but not limited to the risks of infection, bleeding, nerve injury, cardiopulmonary complications, the need for revision surgery, among others, and the patient was willing to proceed.  OPERATIVE IMPLANTS: [employer] [place] nail   OPERATIVE FINDINGS: unstable distal femur fracture   BLOOD LOSS: min  COMPLICATIONS: none  TOURNIQUET TIME: none  OPERATIVE PROCEDURE:  Patient was identified in the preoperative holding area and site was marked by me She was transported to the operating theater and placed on the table in supine position taking care to pad all bony prominences. After a preincinduction time out anesthesia was induced. The left Lower extremity was prepped and draped in normal sterile fashion and a pre-incision timeout was performed. She received ancef for preoperative antibiotics.   I performed a closed reduction of the fracture.  I then made a medial parapatellar incision and dissected down to the peritenon. I continued that incision to the medial parapatellar approach and was careful to protect the articular cartilage meniscus and cruciates.  Next I inserted the guidepin under fluoroscopic visualization at the tip of [place] line and centered on the AP. I used the entry  reamer to ream an entryway into the canal this point.  Next I passed the ball tip wire across the fracture confirmed in appropriate reduction and sequentially reamed up to the appropriate size for the above nail. The nail ended above the subtrochanteric region.  I selected the nail and inserted it under fluoroscopic guidance.  I was happy with the reduction I then placed four distal interlock screws obtaining a good purchase with all of them.  I then locked the distal screws and using a perfect circles technique placed a anterior to posterior proximal interlock screw.  final fluoroscopic images showed appropriate reduction and alignment of hardware I then thoroughly irrigated all wounds and the skin was closed.  POST OPERATIVE PLAN: Nonweightbearing knee immobilizer full time. DVT px: SCD's and chemical px

## 2016-11-06 NOTE — Progress Notes (Signed)
Nutrition Follow-up  DOCUMENTATION CODES:   Not applicable  INTERVENTION:  - Continue Ensure Enlive po BID when medically appropriate, each supplement provides 350 kcal and 20 grams of protein   NUTRITION DIAGNOSIS:   Increased nutrient needs related to  (post-op healing) as evidenced by estimated needs.  Ongoing   GOAL:   Patient will meet greater than or equal to 90% of their needs  Unmet at this time  MONITOR:   PO intake, Supplement acceptance, Labs, Weight trends, Skin, I & O's  REASON FOR ASSESSMENT:   Malnutrition Screening Tool    ASSESSMENT:   Isabel Collins is a 76 y.o. female with medical history significant of COPD, diabetes mellitus, hypertension, hyperlipidemia, recent diagnosis of right upper pole transitional cell carcinoma, seen recently by urology with recent ureteroscopy/biopsy and stent, supposed to have nephrectomy 3 days from now, presents to the emergency room from her skilled nursing home after having a fall and injuring her left knee.   Pt admitted with acute displaced fracture of left femur. Pt is S/P surgery this morning of intramedullary retrograde femoral nailing  Pt unable to answer RD questions at time of visit.  Discussed pt with RN. Per RN, pt has been NPO today for surgery and unable to try Ensure supplementation. Per RN, pt was eating ok yesterday and drank tea well so suspects pt will consume Ensure well. Per RN, pt taking medications with applesauce without difficulty.  Per chart review, pt consumed 5% of one meal this admission. Per chart review, pt has had minimal urine output. Pt is net positive 9.8 L since admission.   Labs reviewed; CBG 124-179, Na 146  Medications reviewed; Sliding scale insulin, Protonix, Senokot-S, Ducolax  Diet Order:     Skin:  Reviewed, no issues  Last BM:  PTA  Height:   Ht Readings from Last 1 Encounters:  11/12/2016 5\' 2"  (1.575 m)    Weight:   Wt Readings from Last 1 Encounters:   11/22/2016 144 lb (65.3 kg)    Ideal Body Weight:  50 kg  BMI:  Body mass index is 26.34 kg/m.  Estimated Nutritional Needs:   Kcal:  1650-1850  Protein:  80-95 grams  Fluid:  1.6-1.8 L  EDUCATION NEEDS:   Education needs addressed  Parks Ranger, MS, RDN, LDN 11/22/2016 4:12 PM

## 2016-11-06 NOTE — Transfer of Care (Signed)
Immediate Anesthesia Transfer of Care Note  Patient: Isabel Collins  Procedure(s) Performed: Procedure(s): INTRAMEDULLARY (IM) RETROGRADE FEMORAL NAILING (Left)  Patient Location: PACU  Anesthesia Type:General  Level of Consciousness: awake and alert   Airway & Oxygen Therapy: Patient Spontanous Breathing and Patient connected to nasal cannula oxygen  Post-op Assessment: Report given to RN and Post -op Vital signs reviewed and stable  Post vital signs: Reviewed and stable  Last Vitals:  Vitals:   11/13/2016 0511 11/15/2016 0814  BP: (!) 110/49   Pulse: (!) 122   Resp: 17   Temp: 36.6 C   SpO2: 93% 93%    Last Pain:  Vitals:   11/17/2016 0948  TempSrc:   PainSc: 0-No pain      Patients Stated Pain Goal: 4 (58/68/25 7493)  Complications: No apparent anesthesia complications

## 2016-11-06 NOTE — Progress Notes (Signed)
Spoke with [facility] PA no change in left foot ststus still cool no DP pulse PT present with doppler toes bluish no rx received may return to room

## 2016-11-06 NOTE — Progress Notes (Signed)
Pt returned to room before x ray done xray departmenr called 3 times for total of 1hr wait and stiill not arrived to do xray so pt returned to room and xray will be done in pts room

## 2016-11-06 NOTE — Anesthesia Procedure Notes (Signed)
Procedure Name: Intubation Date/Time: 11/22/2016 10:37 AM Performed by: Candis Shine Pre-anesthesia Checklist: Patient identified, Emergency Drugs available, Suction available, Patient being monitored and Timeout performed Patient Re-evaluated:Patient Re-evaluated prior to induction Oxygen Delivery Method: Circle System Utilized Preoxygenation: Pre-oxygenation with 100% oxygen Induction Type: IV induction Ventilation: Mask ventilation without difficulty Laryngoscope Size: Mac and 3 Grade View: Grade I Tube type: Oral Tube size: 7.0 mm Number of attempts: 1 Airway Equipment and Method: Stylet and Oral airway Placement Confirmation: ETT inserted through vocal cords under direct vision,  positive ETCO2 and breath sounds checked- equal and bilateral Secured at: 22 cm Tube secured with: Tape Dental Injury: Teeth and Oropharynx as per pre-operative assessment

## 2016-11-06 NOTE — Interval H&P Note (Signed)
History and Physical Interval Note:  11/28/2016 9:35 AM  Isabel Collins  has presented today for surgery, with the diagnosis of femur fracture left  The various methods of treatment have been discussed with the patient and family. After consideration of risks, benefits and other options for treatment, the patient has consented to  Procedure(s): INTRAMEDULLARY (IM) RETROGRADE FEMORAL NAILING (Left) as a surgical intervention .  The patient's history has been reviewed, patient examined, no change in status, stable for surgery.  I have reviewed the patient's chart and labs.  Questions were answered to the patient's satisfaction.     Chrishun Scheer D

## 2016-11-07 ENCOUNTER — Encounter (HOSPITAL_COMMUNITY): Payer: Self-pay | Admitting: Orthopedic Surgery

## 2016-11-07 LAB — COMPREHENSIVE METABOLIC PANEL
ALBUMIN: 1.4 g/dL — AB (ref 3.5–5.0)
ALT: 12 U/L — ABNORMAL LOW (ref 14–54)
ANION GAP: 12 (ref 5–15)
AST: 69 U/L — ABNORMAL HIGH (ref 15–41)
Alkaline Phosphatase: 94 U/L (ref 38–126)
BUN: 28 mg/dL — ABNORMAL HIGH (ref 6–20)
CALCIUM: 7.9 mg/dL — AB (ref 8.9–10.3)
CHLORIDE: 120 mmol/L — AB (ref 101–111)
CO2: 14 mmol/L — AB (ref 22–32)
Creatinine, Ser: 1.25 mg/dL — ABNORMAL HIGH (ref 0.44–1.00)
GFR calc non Af Amer: 40 mL/min — ABNORMAL LOW (ref >60)
GFR, EST AFRICAN AMERICAN: 47 mL/min — AB (ref >60)
GLUCOSE: 137 mg/dL — AB (ref 65–99)
POTASSIUM: 4.8 mmol/L (ref 3.5–5.1)
SODIUM: 146 mmol/L — AB (ref 135–145)
Total Bilirubin: 0.4 mg/dL (ref 0.3–1.2)
Total Protein: 5.1 g/dL — ABNORMAL LOW (ref 6.5–8.1)

## 2016-11-07 LAB — TYPE AND SCREEN
ABO/RH(D): A POS
ANTIBODY SCREEN: NEGATIVE

## 2016-11-07 LAB — CBC
HCT: 28 % — ABNORMAL LOW (ref 36.0–46.0)
HEMOGLOBIN: 8.6 g/dL — AB (ref 12.0–15.0)
MCH: 26.4 pg (ref 26.0–34.0)
MCHC: 30.7 g/dL (ref 30.0–36.0)
MCV: 85.9 fL (ref 78.0–100.0)
PLATELETS: 90 10*3/uL — AB (ref 150–400)
RBC: 3.26 MIL/uL — ABNORMAL LOW (ref 3.87–5.11)
RDW: 18.3 % — AB (ref 11.5–15.5)
WBC: 16.3 10*3/uL — ABNORMAL HIGH (ref 4.0–10.5)

## 2016-11-07 LAB — ABO/RH: ABO/RH(D): A POS

## 2016-11-07 LAB — GLUCOSE, CAPILLARY
GLUCOSE-CAPILLARY: 123 mg/dL — AB (ref 65–99)
GLUCOSE-CAPILLARY: 122 mg/dL — AB (ref 65–99)
Glucose-Capillary: 123 mg/dL — ABNORMAL HIGH (ref 65–99)
Glucose-Capillary: 111 mg/dL — ABNORMAL HIGH (ref 65–99)

## 2016-11-07 MED ORDER — CEFAZOLIN SODIUM-DEXTROSE 2-4 GM/100ML-% IV SOLN: 2.0000 g | INTRAVENOUS | Status: DC

## 2016-11-07 MED ORDER — MAGNESIUM CITRATE PO SOLN
1.0000 | Freq: Once | ORAL | Status: AC
  Administered 2016-11-07: 1 via ORAL
  Filled 2016-11-07: qty 296

## 2016-11-07 MED ORDER — SODIUM BICARBONATE 8.4 % IV SOLN
INTRAVENOUS | Status: DC
  Administered 2016-11-07 – 2016-11-10 (×4): via INTRAVENOUS
  Filled 2016-11-07 (×9): qty 150

## 2016-11-07 MED ORDER — ACETAMINOPHEN 325 MG PO TABS
650.0000 mg | ORAL_TABLET | ORAL | Status: DC | PRN
Start: 2016-11-07 — End: 2016-11-13
  Administered 2016-11-07 – 2016-11-09 (×2): 650 mg via ORAL
  Filled 2016-11-07 (×2): qty 2

## 2016-11-07 NOTE — Progress Notes (Signed)
On assessment at 1300, LLE toes were purple and cool to the touch. Cap refill brisk and pulse dopplerable. With the help of PT, LLE re-wrapped with ace wrap. Will continue to monitor.

## 2016-11-07 NOTE — Progress Notes (Signed)
PROGRESS NOTE    Isabel Collins  KPT:[id number] DOB: Oct 19, 1940 DOA: 10/30/2016 PCP: [name], MD    Brief Narrative:  Patient is 76 year old female history of COPD, diabetes, hypertension, hyperlipidemia recent diagnosis of right upper pole transitional cell carcinoma was seen by urology with recent uteroscopic/biopsy and stent placement was supposed to have a nephrectomy 3 days prior to admission presented to the ED from SNF after fall with left knee injury. Patient with traumatic closed displaced supracondylar fracture of the left femur. Patient seen by orthopedics and underwent IM nailing 11/12/2016. Hospitalization complicated by bacteremia.   Assessment & Plan:   Principal Problem:   Traumatic closed displaced supracondylar fracture of left femur ([place]) Active Problems:   Hypertension   Physical deconditioning   Hypothyroidism   Chronic obstructive pulmonary disease (HCC)   Type 2 diabetes mellitus with complication, without long-term current use of insulin (HCC)   Urothelial carcinoma of kidney, right (HCC)   Gastroesophageal reflux disease   History of recent fall   Right renal mass   Fracture of knee prosthesis (HCC)   AKI (acute kidney injury) ([place])   UTI due to extended-spectrum beta lactamase (ESBL) producing Escherichia coli   Acute renal failure (ARF) ([place])   Fall   Sepsis due to Escherichia coli (E. 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 Patient status post I am nail 17/00/1749 with no complications. Patient on IV antibiotics. Per orthopedics.  #2 Escherichia coli bacteremia/Escherichia coli UTI Patient currently afebrile. Repeat blood cultures with no growth to date. Patient afebrile however with blood pressure that is borderline. Continue IV meropenem. Will need to discuss with ID in the  next day about antibiotic duration and recommendations. Follow for now.  #3 constipation Felt likely secondary to pain regimen. Improved with Dulcolax suppository.  #4 Sepsis presented on admission with multidrug resistant Escherichia coli/ multidrug resistant Escherichia coli UTI in the setting of right upper pole transitional cell carcinoma pending nephrectomy -  blood cultures consistent with Escherichia coli as well as urine cultures consistent with Escherichia coli. Escherichia coli multidrug resistant. Repeat blood cultures with no growth to date. Continue IV Merrem.   #5 encephalopathy Felt to be multifactorial secondary to acute sepsis and probable opioid pain medication. Patient somewhat drowsy however alert and following commands. Will discontinue Ultram. Place on Tylenol as needed for pain control.   #6 acute kidney injury on chronic kidney disease stage II Felt to be initially secondary to sepsis/acute infection. Renal ultrasound negative for any hydro-nephrosis. Foley catheter inserted. Follow renal function.  #7 hyperlipidemia Continue statin.  #8 metabolic acidosis Place on bicarbonate follow.  #9 hypertension Blood pressure borderline. Follow.  #10 borderline type 2 diabetes mellitus Sliding scale insulin.  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   DVT prophylaxis: Lovenox Code Status: Full Family Communication: Updated patient. No family at bedside. Disposition Plan: Likely skilled nursing facility when medically stable.   Consultants:   Orthopedics: Dr. [name] 10/30/2016  Procedures:  CT chest 10/30/2016  Plain films of the pelvis 10/13/2016  Playful of the left knee 10/19/2016  Renal ultrasound 10/10/2016  Intramedullary retrograde femoral nailing per Dr. [name] 11/24/2016  Antimicrobials:   IV Ancef 10/30/2016>>>>>  11/07/2016  IV Merrem 10/14/2016>>>>  IV Zosyn  a 27 [id number] dose.  IV vancomycin   Subjective: Patient drowsy and sleepy however easily arousable. Patient denies any chest pain. No shortness of breath.  Objective: Vitals:   11/15/2016 2219 11/07/16 0339 11/07/16 1600 11/07/16 1642  BP: (!) 89/60 (!) 95/44 (!) 91/52 (!) 100/55  Pulse: (!) 108 (!) 106 97   Resp: 15 17 16    Temp: 97.8 F (36.6 C) 97.8 F (36.6 C) 98 F (36.7 C)   TempSrc: Oral  Oral   SpO2: 93% 92% 95%   Weight:      Height:        Intake/Output Summary (Last 24 hours) at 11/07/16 2049 Last data filed at 11/07/16 1004  Gross per 24 hour  Intake              120 ml  Output              100 ml  Net               20 ml   Filed Weights   10/13/2016 1233 12/01/2016 0948  Weight: 65.3 kg (144 lb) 65.3 kg (144 lb)    Examination:  General exam: Drowsy. Is really arousable.  Respiratory system: Clear to auscultation anterior lung fields. Respiratory effort normal. Cardiovascular system: S1 & S2 heard, RRR. No JVD, murmurs, rubs, gallops or clicks. 2-3+ bilateral lower extremity edema. Gastrointestinal system: Abdomen is nondistended, soft and nontender. No organomegaly or masses felt. Normal bowel sounds heard. Central nervous system: Patient somewhat drowsy otherwise easily arousable. No focal neurological deficits. Extremities: Knee immobilizer on left lower extremity. Symmetric 5 x 5 power. Skin: No rashes, lesions or ulcers Psychiatry: Judgement and insight appear normal. Mood & affect appropriate.     Data Reviewed: I have personally reviewed following labs and imaging studies  CBC:  Recent Labs Lab 11/03/16 0016 11/05/16 0313 11/14/2016 0510 11/13/2016 0821 11/07/16 0950  WBC 16.1* 17.0* 14.4* 17.4* 16.3*  HGB 9.6* 9.2* 8.0* 9.1* 8.6*  HCT 30.4* 31.3* 27.0* 31.1* 28.0*  MCV 83.5 86.2 86.3 86.9 85.9  PLT 154 140* QUESTIONABLE RESULTS, RECOMMEND RECOLLECT TO VERIFY 127* 90*   Basic Metabolic  Panel:  Recent Labs Lab 11/01/16 0439 11/03/16 0016 11/05/16 0313 11/15/2016 0815 11/18/2016 0821 11/07/16 0950  NA 138 140 144  --  146* 146*  K 4.2 5.1 4.2  --  4.4 4.8  CL 108 114* 116*  --  122* 120*  CO2 20* 15* 18*  --  17* 14*  GLUCOSE 151* 134* 144*  --  139* 137*  BUN 39* 31* 25*  --  25* 28*  CREATININE 1.20* 0.94 1.08* 1.12* 1.12* 1.25*  CALCIUM 7.9* 8.0* 8.0*  --  8.0* 7.9*   GFR: Estimated Creatinine Clearance: 33.4 mL/min (A) (by C-G formula based on SCr of 1.25 mg/dL (H)). Liver Function Tests:  Recent Labs Lab 11/01/16 0439 11/07/16 0950  AST 37 69*  ALT 10* 12*  ALKPHOS 116 94  BILITOT 0.7 0.4  PROT 5.1* 5.1*  ALBUMIN 1.6* 1.4*   No results for input(s): LIPASE, AMYLASE in the last 168 hours. No results for input(s): AMMONIA in the last 168 hours. Coagulation Profile: No results for input(s): INR, PROTIME in the last 168 hours. Cardiac Enzymes: No results for input(s): CKTOTAL, CKMB, CKMBINDEX, TROPONINI in the last 168 hours. BNP (last 3 results) No results for input(s): PROBNP in the last 8760 hours. HbA1C: No results for input(s): HGBA1C in the last 72 hours. CBG:  Recent  Labs Lab 11/17/2016 1703 11/21/2016 2233 11/07/16 0751 11/07/16 1214 11/07/16 1718  GLUCAP 127* 125* 123* 123* 122*   Lipid Profile: No results for input(s): CHOL, HDL, LDLCALC, TRIG, CHOLHDL, LDLDIRECT in the last 72 hours. Thyroid Function Tests: No results for input(s): TSH, T4TOTAL, FREET4, T3FREE, THYROIDAB in the last 72 hours. Anemia Panel: No results for input(s): VITAMINB12, FOLATE, FERRITIN, TIBC, IRON, RETICCTPCT in the last 72 hours. Sepsis Labs: No results for input(s): PROCALCITON, LATICACIDVEN in the last 168 hours.  Recent Results (from the past 240 hour(s))  Blood culture (routine x 2)     Status: Abnormal   Collection Time: 10/06/2016  2:03 PM  Result Value Ref Range Status   Specimen Description BLOOD PICC LINE RIGHT ARM  Final   Special Requests    Final    BOTTLES DRAWN AEROBIC AND ANAEROBIC Blood Culture adequate volume   Culture  Setup Time   Final    GRAM NEGATIVE RODS IN BOTH AEROBIC AND ANAEROBIC BOTTLES CRITICAL RESULT CALLED TO, READ BACK BY AND VERIFIED WITH: T.EGAN, PHARMD 10/30/16 0715 L.CHAMPION    Culture (A)  Final    ESCHERICHIA COLI Confirmed Extended Spectrum Beta-Lactamase Producer (ESBL) Performed at [facility], [place] [address] [place], [place] [address]    Report Status 11/01/2016 FINAL  Final   Organism ID, Bacteria ESCHERICHIA COLI  Final      Susceptibility   Escherichia coli - MIC*    AMPICILLIN >=32 RESISTANT Resistant     CEFAZOLIN >=64 RESISTANT Resistant     CEFEPIME 32 RESISTANT Resistant     CEFTAZIDIME 16 INTERMEDIATE Intermediate     CEFTRIAXONE >=64 RESISTANT Resistant     CIPROFLOXACIN >=4 RESISTANT Resistant     GENTAMICIN <=1 SENSITIVE Sensitive     IMIPENEM <=0.25 SENSITIVE Sensitive     TRIMETH/SULFA >=320 RESISTANT Resistant     AMPICILLIN/SULBACTAM >=32 RESISTANT Resistant     PIP/TAZO 8 SENSITIVE Sensitive     Extended ESBL POSITIVE Resistant     * ESCHERICHIA COLI  Urine culture     Status: Abnormal   Collection Time: 10/15/2016  2:03 PM  Result Value Ref Range Status   Specimen Description URINE, RANDOM  Final   Special Requests NONE  Final   Culture (A)  Final    >=100,000 COLONIES/mL ESCHERICHIA COLI Confirmed Extended Spectrum Beta-Lactamase Producer (ESBL) Performed at [facility], [place] [address], [place], [place] [address]    Report Status 11/01/2016 FINAL  Final   Organism ID, Bacteria ESCHERICHIA COLI (A)  Final      Susceptibility   Escherichia coli - MIC*    AMPICILLIN >=32 RESISTANT Resistant     CEFAZOLIN >=64 RESISTANT Resistant     CEFTRIAXONE >=64 RESISTANT Resistant     CIPROFLOXACIN >=4 RESISTANT Resistant     GENTAMICIN <=1 SENSITIVE Sensitive     IMIPENEM <=0.25 SENSITIVE Sensitive     NITROFURANTOIN <=16 SENSITIVE Sensitive      TRIMETH/SULFA >=320 RESISTANT Resistant     AMPICILLIN/SULBACTAM >=32 RESISTANT Resistant     PIP/TAZO 8 SENSITIVE Sensitive     Extended ESBL POSITIVE Resistant     * >=100,000 COLONIES/mL ESCHERICHIA COLI  Blood Culture ID Panel (Reflexed)     Status: Abnormal   Collection Time: 10/14/2016  2:03 PM  Result Value Ref Range Status   Enterococcus species NOT DETECTED NOT DETECTED Final   Listeria monocytogenes NOT DETECTED NOT DETECTED Final   Staphylococcus species NOT DETECTED NOT DETECTED Final  Staphylococcus aureus NOT DETECTED NOT DETECTED Final   Streptococcus species NOT DETECTED NOT DETECTED Final   Streptococcus agalactiae NOT DETECTED NOT DETECTED Final   Streptococcus pneumoniae NOT DETECTED NOT DETECTED Final   Streptococcus pyogenes NOT DETECTED NOT DETECTED Final   Acinetobacter baumannii NOT DETECTED NOT DETECTED Final   Enterobacteriaceae species DETECTED (A) NOT DETECTED Final    Comment: Enterobacteriaceae represent a large family of gram-negative bacteria, not a single organism. CRITICAL RESULT CALLED TO, READ BACK BY AND VERIFIED WITH: T.EGAN, PHARMD 10/30/16 0715 L.CHAMPION    Enterobacter cloacae complex NOT DETECTED NOT DETECTED Final   Escherichia coli DETECTED (A) NOT DETECTED Final    Comment: CRITICAL RESULT CALLED TO, READ BACK BY AND VERIFIED WITH: T.EGAN, PHARMD 10/30/16 0715 L.CHAMPION    Klebsiella oxytoca NOT DETECTED NOT DETECTED Final   Klebsiella pneumoniae NOT DETECTED NOT DETECTED Final   Proteus species NOT DETECTED NOT DETECTED Final   Serratia marcescens NOT DETECTED NOT DETECTED Final   Carbapenem resistance NOT DETECTED NOT DETECTED Final   Haemophilus influenzae NOT DETECTED NOT DETECTED Final   Neisseria meningitidis NOT DETECTED NOT DETECTED Final   Pseudomonas aeruginosa NOT DETECTED NOT DETECTED Final   Candida albicans NOT DETECTED NOT DETECTED Final   Candida glabrata NOT DETECTED NOT DETECTED Final   Candida krusei NOT DETECTED NOT  DETECTED Final   Candida parapsilosis NOT DETECTED NOT DETECTED Final   Candida tropicalis NOT DETECTED NOT DETECTED Final    Comment: Performed at [facility], [place] [address] [place], [place] [address]  Blood culture (routine x 2)     Status: None   Collection Time: 10/25/2016  2:27 PM  Result Value Ref Range Status   Specimen Description BLOOD BLOOD RIGHT HAND  Final   Special Requests IN PEDIATRIC BOTTLE Blood Culture adequate volume  Final   Culture   Final    NO GROWTH 5 DAYS Performed at [facility], [place] [address], [place], [place] [address]    Report Status 11/03/2016 FINAL  Final  Surgical pcr screen     Status: None   Collection Time: 11/02/2016  9:08 PM  Result Value Ref Range Status   MRSA, PCR NEGATIVE NEGATIVE Final   Staphylococcus aureus NEGATIVE NEGATIVE Final    Comment:        The Xpert SA Assay (FDA approved for NASAL specimens in patients over 65 years of age), is one component of a comprehensive surveillance program.  Test performance has been validated by [facility] for patients greater than or equal to 54 year old. It is not intended to diagnose infection nor to guide or monitor treatment.   Culture, blood (routine x 2)     Status: None   Collection Time: 10/31/16  5:30 AM  Result Value Ref Range Status   Specimen Description BLOOD LEFT ASSIST CONTROL  Final   Special Requests IN PEDIATRIC BOTTLE Blood Culture adequate volume  Final   Culture NO GROWTH 5 DAYS  Final   Report Status 11/05/2016 FINAL  Final  Culture, blood (routine x 2)     Status: None   Collection Time: 10/31/16  5:41 AM  Result Value Ref Range Status   Specimen Description BLOOD LEFT HAND  Final   Special Requests IN PEDIATRIC BOTTLE Blood Culture adequate volume  Final   Culture NO GROWTH 5 DAYS  Final   Report Status 11/05/2016 FINAL  Final         Radiology Studies: Dg C-arm  1-60 Min  Result Date: 11/07/2016 CLINICAL DATA:  Fixation of supracondylar femur  fracture. EXAM: DG C-ARM 61-120 MIN; LEFT FEMUR 2 VIEWS COMPARISON:  Radiographs 10/04/2016 FINDINGS: 4 fluoroscopic spot images demonstrate a long intramedullary rod in the femur with 4 distal and 1 proximal interlocking screws. Good position and alignment of the supracondylar femur fracture just above the knee prosthesis. No complicating features. IMPRESSION: Internal fixation of supracondylar femur fracture. Electronically Signed   By: Marijo Sanes M.D.   On: 11/13/2016 12:02   Dg Femur Min 2 Views Left  Result Date: 11/08/2016 CLINICAL DATA:  Fixation of supracondylar femur fracture. EXAM: DG C-ARM 61-120 MIN; LEFT FEMUR 2 VIEWS COMPARISON:  Radiographs 10/20/2016 FINDINGS: 4 fluoroscopic spot images demonstrate a long intramedullary rod in the femur with 4 distal and 1 proximal interlocking screws. Good position and alignment of the supracondylar femur fracture just above the knee prosthesis. No complicating features. IMPRESSION: Internal fixation of supracondylar femur fracture. Electronically Signed   By: Marijo Sanes M.D.   On: 11/07/2016 12:02   Dg Femur Port Min 2 Views Left  Result Date: 11/10/2016 CLINICAL DATA:  LEFT supracondylar femur fracture go this is EXAM: LEFT FEMUR PORTABLE 2 VIEWS COMPARISON:  10/27/2016 FINDINGS: Intramedullary nail fixation of distal supracondylar femur fracture. There are 4 distal cortical locking screws. One proximal locking cortical screw. Improvement in alignment of the fracture. Total knee arthroplasty noted. IMPRESSION: Internal female fixation of distal LEFT supracondylar femur fracture without complication Electronically Signed   By: Suzy Bouchard M.D.   On: 11/21/2016 14:38        Scheduled Meds: . atorvastatin  40 mg Oral QHS  . cycloSPORINE  1 drop Both Eyes BID  . DULoxetine  30 mg Oral q1800  . DULoxetine  60 mg Oral Daily  . enoxaparin (LOVENOX) injection  40 mg Subcutaneous Q24H  . feeding supplement (ENSURE ENLIVE)  237 mL Oral BID BM  .  fluconazole  100 mg Oral Daily  . insulin aspart  0-9 Units Subcutaneous TID WC  . levothyroxine  75 mcg Oral QAC breakfast  . mometasone-formoterol  2 puff Inhalation BID  . pantoprazole  40 mg Oral Daily  . pregabalin  150 mg Oral BID   Continuous Infusions: . meropenem (MERREM) IV Stopped (11/07/16 1827)  .  sodium bicarbonate  infusion 1000 mL 100 mL/hr at 11/07/16 1827     LOS: 9 days    Time spent: 81 minutes    THOMPSON,DANIEL, MD [facility] Pager [phone]  If 7PM-7AM, please contact night-coverage [email or web address] Password [facility] 11/07/2016, 8:49 PM

## 2016-11-07 NOTE — Progress Notes (Signed)
  Speech Language Pathology Treatment: Dysphagia  Patient Details Name: [name] MRN: [id number] DOB: August 29, 1940 Today's Date: 11/07/2016 Time: 6759-1638 SLP Time Calculation (min) (ACUTE ONLY): 10 min  Assessment / Plan / Recommendation Clinical Impression  Pt lethargic, now on clear liquids s/p surgery.  Consuming thin liquids without overt difficulty, no s/s of aspiration, no oral deficits with liquid consumption.  Min cues overall.  Afebrile; LS clear, diminished.    HPI HPI: Pt is a 76 yo female admitted s/p fall with a traumatic closed displaced supracondylar fracture of left femur, s/p IM nail 9/5.  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, HH, GERD      SLP Plan  Continue with current plan of care       Recommendations  Diet recommendations: Dysphagia 2 (fine chop);Thin liquid (when ready to advance from clears per surgery) Liquids provided via: Cup;Straw Medication Administration: Whole meds with puree Supervision: Full supervision/cueing for compensatory strategies;Staff to assist with self feeding Compensations: Slow rate;Small sips/bites;Other (Comment) Postural Changes and/or Swallow Maneuvers: Seated upright 90 degrees;Upright 30-60 min after meal                Oral Care Recommendations: Oral care QID Follow up Recommendations: None SLP Visit Diagnosis: Dysphagia, oral phase (R13.11) Plan: Continue with current plan of care       GO                Isabel Collins 11/07/2016, 5:42 PM

## 2016-11-07 NOTE — Progress Notes (Addendum)
   Assessment / Plan: 1 Day Post-Op  S/P Procedure(s) (LRB): INTRAMEDULLARY (IM) RETROGRADE FEMORAL NAILING (Left) by Dr. Ernesta Amble. [name] on 11/09/2016  Principal Problem:   Traumatic closed displaced supracondylar fracture of left femur ([place]) Active Problems:   Hypertension   Physical deconditioning   Hypothyroidism   Chronic obstructive pulmonary disease (HCC)   Type 2 diabetes mellitus with complication, without long-term current use of insulin (HCC)   Urothelial carcinoma of kidney, right (HCC)   Gastroesophageal reflux disease   History of recent fall   Right renal mass   Fracture of knee prosthesis (HCC)   AKI (acute kidney injury) ([place])   UTI due to extended-spectrum beta lactamase (ESBL) producing Escherichia coli   Acute renal failure (ARF) ([place])   Fall   Sepsis due to Escherichia coli (E. coli) ([place])   Bacteremia due to Gram-negative bacteria   Sepsis secondary to UTI ([place])   Transitional cell carcinoma (HCC)  Closed displaced left supracondylar femur fracture POD1 status post IM retrograde femoral nail Continue management of comorbidities per medicine/urology  Mobilize with therapy when able. Incentive Spirometry Apply ice prn  Weight Bearing: Non Weight Bearing (NWB) LLE Dressings: Reinforce prn.  VTE prophylaxis: Lovenox, SCDs, ambulation Dispo: per primary.  Orthopedic issues stable  Subjective: Patient denies pain.  Objective:   VITALS:   Vitals:   11/09/2016 1410 11/22/2016 2010 11/12/2016 2219 11/07/16 0339  BP: (!) 93/55  (!) 89/60 (!) 95/44  Pulse: (!) 111  (!) 108 (!) 106  Resp: 16  15 17   Temp: 98.1 F (36.7 C)  97.8 F (36.6 C) 97.8 F (36.6 C)  TempSrc: Oral  Oral   SpO2: 90% 92% 93% 92%  Weight:      Height:       CBC Latest Ref Rng & Units 11/08/2016 11/17/2016 11/05/2016  WBC 4.0 - 10.5 K/uL 17.4(H) 14.4(H) 17.0(H)  Hemoglobin 12.0 - 15.0 g/dL 9.1(L) 8.0(L) 9.2(L)  Hematocrit 36.0 - 46.0 % 31.1(L) 27.0(L) 31.3(L)  Platelets 150 - 400 K/uL  127(L) QUESTIONABLE RESULTS, RECOMMEND RECOLLECT TO VERIFY 140(L)   BMP Latest Ref Rng & Units 11/10/2016 11/14/2016 11/05/2016  Glucose 65 - 99 mg/dL 139(H) - 144(H)  BUN 6 - 20 mg/dL 25(H) - 25(H)  Creatinine 0.44 - 1.00 mg/dL 1.12(H) 1.12(H) 1.08(H)  BUN/Creat Ratio 12 - 28 - - -  Sodium 135 - 145 mmol/L 146(H) - 144  Potassium 3.5 - 5.1 mmol/L 4.4 - 4.2  Chloride 101 - 111 mmol/L 122(H) - 116(H)  CO2 22 - 32 mmol/L 17(L) - 18(L)  Calcium 8.9 - 10.3 mg/dL 8.0(L) - 8.0(L)   Intake/Output      09/04 0701 - 09/05 0700 09/05 0701 - 09/06 0700   P.O. 0    I.V. (mL/kg) 1806.7 (27.7)    IV Piggyback 0    Total Intake(mL/kg) 1806.7 (27.7)    Urine (mL/kg/hr) 250 (0.2)    Blood 50    Total Output 300     Net +1506.7             Physical Exam: General: frail, ill-appearing.  NAD.  Somnolent and intermittently responds to questions. MSK Knee immobilizer in place. Sensation intact distally Feet cool / symmetric  Dorsiflexion/Plantar flexion intact Incision: dressing C/D/I   Prudencio Burly III, PA-C 11/07/2016, 8:36 AM

## 2016-11-07 NOTE — Progress Notes (Signed)
MD Grandville Silos paged re: pt's BP 91/52. Awaiting response.

## 2016-11-07 NOTE — Evaluation (Signed)
Physical Therapy Evaluation Patient Details Name: Isabel Collins MRN: [id number] DOB: 31-Mar-1940 Today's Date: 11/07/2016   History of Present Illness  76 yo female admitted due to fall with L femur fx s/p IM nail.  Has h/o acute pyelonephritis, chronic anemia, OA, COPD, anxiety, fibromyalgia, transitional cell carcinoma awaiting R nephrectomy..   Clinical Impression  Patient presents with decreased independence with mobility due to pain, decreased balance, decreased strength, poor activity tolerance, decreased cognition and non-weight bearing L LE.  She will need skilled PT in the acute setting to,maximize mobility and strength prior to return to SNF level rehab at d/c.     Follow Up Recommendations SNF    Equipment Recommendations  None recommended by PT    Recommendations for Other Services       Precautions / Restrictions Precautions Precautions: Fall Required Braces or Orthoses: Knee Immobilizer - Left Restrictions Weight Bearing Restrictions: Yes LLE Weight Bearing: Non weight bearing      Mobility  Bed Mobility Overal bed mobility: Needs Assistance Bed Mobility: Supine to Sit;Sit to Supine     Supine to sit: +2 for physical assistance;Total assist Sit to supine: +2 for physical assistance;Total assist   General bed mobility comments: Patient unable to move legs unaided, assisted legs to edge of bed and lifted trunk with +2 A; to supine assist for trunk and legs.   Transfers                 General transfer comment: NT due to severe posterior bias at EOB and pt NWB L LE  Ambulation/Gait                Stairs            Wheelchair Mobility    Modified Rankin (Stroke Patients Only)       Balance Overall balance assessment: Needs assistance;History of Falls Sitting-balance support: Bilateral upper extremity supported Sitting balance-Leahy Scale: Zero Sitting balance - Comments: mod to max A due to posterior lean, little better when  engaged in attempts to eat/drink from liquid tray; needed A for liquids, nurse tech aware needs assist wtih tray.                                     Pertinent Vitals/Pain Pain Assessment: Faces Faces Pain Scale: Hurts whole lot Pain Location: L LE with movement Pain Descriptors / Indicators: Grimacing;Guarding Pain Intervention(s): Limited activity within patient's tolerance;Monitored during session;Repositioned    Home Living Family/patient expects to be discharged to:: Skilled nursing facility Living Arrangements: Alone Available Help at Discharge: Personal care attendant (3 hours a day every other day) Type of Home: House Home Access: Elevator     Home Layout: One level Home Equipment: Environmental consultant - 2 wheels      Prior Function Level of Independence: Independent with assistive device(s)         Comments: information from previous chart; pt adamant she came from home, but notes that she went to [facility] after last hosptialization      Hand Dominance        Extremity/Trunk Assessment   Upper Extremity Assessment Upper Extremity Assessment: Generalized weakness    Lower Extremity Assessment Lower Extremity Assessment: RLE deficits/detail;LLE deficits/detail RLE Deficits / Details: generalized edema, able to flex knee about 45 degrees with assistance due to edema;  LLE Deficits / Details: lifting and moving leg only with help, did not  try to flex knee, pt moans in pain, but rewrapped ace bandage with RN help due to tight at ankle and toes looking purple. LLE: Unable to fully assess due to pain    Cervical / Trunk Assessment Cervical / Trunk Assessment: Kyphotic  Communication   Communication: Expressive difficulties (mumbles)  Cognition Arousal/Alertness: Lethargic Behavior During Therapy: Anxious Overall Cognitive Status: Impaired/Different from baseline Area of Impairment: Orientation;Attention;Following commands;Safety/judgement;Memory                  Orientation Level: Disoriented to;Time;Place Current Attention Level: Sustained Memory: Decreased short-term memory;Decreased recall of precautions Following Commands: Follows one step commands with increased time;Follows one step commands inconsistently Safety/Judgement: Decreased awareness of safety     General Comments: knew she was in hospital, but not name of hosptial, also reported she came from home      General Comments General comments (skin integrity, edema, etc.): patient with foley and very minimal dark urine in bag, noted pad under her wet and RN assessed to see if leaking, feel due to fluid weeping from skin    Exercises     Assessment/Plan    PT Assessment Patient needs continued PT services  PT Problem List Decreased strength;Decreased mobility;Decreased range of motion;Decreased activity tolerance;Decreased balance;Pain;Decreased knowledge of precautions;Decreased knowledge of use of DME       PT Treatment Interventions DME instruction;Therapeutic activities;Therapeutic exercise;Patient/family education;Balance training;Functional mobility training    PT Goals (Current goals can be found in the Care Plan section)  Acute Rehab PT Goals Patient Stated Goal: None stated, but agreeable to work with therapy PT Goal Formulation: With patient Time For Goal Achievement: 11/21/16 Potential to Achieve Goals: Fair    Frequency Min 3X/week   Barriers to discharge        Co-evaluation               AM-PAC PT "6 Clicks" Daily Activity  Outcome Measure Difficulty turning over in bed (including adjusting bedclothes, sheets and blankets)?: Unable Difficulty moving from lying on back to sitting on the side of the bed? : Unable Difficulty sitting down on and standing up from a chair with arms (e.g., wheelchair, bedside commode, etc,.)?: Unable Help needed moving to and from a bed to chair (including a wheelchair)?: Total Help needed walking in  hospital room?: Total Help needed climbing 3-5 steps with a railing? : Total 6 Click Score: 6    End of Session   Activity Tolerance: Patient limited by pain Patient left: in bed;with call bell/phone within reach   PT Visit Diagnosis: Muscle weakness (generalized) (M62.81);Difficulty in walking, not elsewhere classified (R26.2);Pain Pain - Right/Left: Left Pain - part of body: Knee    Time: 3662-9476 PT Time Calculation (min) (ACUTE ONLY): 43 min   Charges:   PT Evaluation $PT Eval High Complexity: 1 High PT Treatments $Therapeutic Activity: 23-37 mins   PT G CodesMagda Kiel, [place] [phone] 11/07/2016   Reginia Naas 11/07/2016, 3:31 PM

## 2016-11-07 NOTE — Anesthesia Postprocedure Evaluation (Signed)
Anesthesia Post Note  Patient: Isabel Collins  Procedure(s) Performed: Procedure(s) (LRB): INTRAMEDULLARY (IM) RETROGRADE FEMORAL NAILING (Left)     Patient location during evaluation: PACU Anesthesia Type: General Level of consciousness: sedated and patient cooperative Pain management: pain level controlled Vital Signs Assessment: post-procedure vital signs reviewed and stable Respiratory status: spontaneous breathing Cardiovascular status: stable Anesthetic complications: no    Last Vitals:  Vitals:   12/02/2016 2219 11/07/16 0339  BP: (!) 89/60 (!) 95/44  Pulse: (!) 108 (!) 106  Resp: 15 17  Temp: 36.6 C 36.6 C  SpO2: 93% 92%    Last Pain:  Vitals:   11/25/2016 2219  TempSrc: Oral  PainSc:                  [name]

## 2016-11-08 DIAGNOSIS — E8770 Fluid overload, unspecified: Secondary | ICD-10-CM

## 2016-11-08 LAB — COMPREHENSIVE METABOLIC PANEL
ALBUMIN: 1.6 g/dL — AB (ref 3.5–5.0)
ALK PHOS: 104 U/L (ref 38–126)
ALT: 11 U/L — AB (ref 14–54)
ANION GAP: 10 (ref 5–15)
AST: 62 U/L — ABNORMAL HIGH (ref 15–41)
BUN: 34 mg/dL — AB (ref 6–20)
CALCIUM: 8 mg/dL — AB (ref 8.9–10.3)
CHLORIDE: 120 mmol/L — AB (ref 101–111)
CO2: 17 mmol/L — ABNORMAL LOW (ref 22–32)
Creatinine, Ser: 1.44 mg/dL — ABNORMAL HIGH (ref 0.44–1.00)
GFR calc Af Amer: 39 mL/min — ABNORMAL LOW (ref >60)
GFR calc non Af Amer: 34 mL/min — ABNORMAL LOW (ref >60)
GLUCOSE: 111 mg/dL — AB (ref 65–99)
Potassium: 4.9 mmol/L (ref 3.5–5.1)
SODIUM: 147 mmol/L — AB (ref 135–145)
TOTAL PROTEIN: 5.1 g/dL — AB (ref 6.5–8.1)
Total Bilirubin: 0.7 mg/dL (ref 0.3–1.2)

## 2016-11-08 LAB — CBC WITH DIFFERENTIAL/PLATELET
Basophils Absolute: 0 10*3/uL (ref 0.0–0.1)
Basophils Relative: 0 %
EOS ABS: 0.1 10*3/uL (ref 0.0–0.7)
Eosinophils Relative: 1 %
HEMATOCRIT: 30.1 % — AB (ref 36.0–46.0)
HEMOGLOBIN: 9.1 g/dL — AB (ref 12.0–15.0)
Lymphocytes Relative: 8 %
Lymphs Abs: 1.5 10*3/uL (ref 0.7–4.0)
MCH: 26.1 pg (ref 26.0–34.0)
MCHC: 30.2 g/dL (ref 30.0–36.0)
MCV: 86.2 fL (ref 78.0–100.0)
MONOS PCT: 6 %
Monocytes Absolute: 1.1 10*3/uL — ABNORMAL HIGH (ref 0.1–1.0)
NEUTROS ABS: 15.6 10*3/uL — AB (ref 1.7–7.7)
Neutrophils Relative %: 85 %
Platelets: 128 10*3/uL — ABNORMAL LOW (ref 150–400)
RBC: 3.49 MIL/uL — ABNORMAL LOW (ref 3.87–5.11)
RDW: 18.7 % — ABNORMAL HIGH (ref 11.5–15.5)
WBC: 18.3 10*3/uL — ABNORMAL HIGH (ref 4.0–10.5)

## 2016-11-08 LAB — CORTISOL: Cortisol, Plasma: 20.2 ug/dL

## 2016-11-08 LAB — GLUCOSE, CAPILLARY
GLUCOSE-CAPILLARY: 90 mg/dL (ref 65–99)
GLUCOSE-CAPILLARY: 71 mg/dL (ref 65–99)
Glucose-Capillary: 115 mg/dL — ABNORMAL HIGH (ref 65–99)
Glucose-Capillary: 105 mg/dL — ABNORMAL HIGH (ref 65–99)

## 2016-11-08 LAB — URINE CULTURE: CULTURE: NO GROWTH

## 2016-11-08 LAB — MAGNESIUM: Magnesium: 2.9 mg/dL — ABNORMAL HIGH (ref 1.7–2.4)

## 2016-11-08 MED ORDER — FUROSEMIDE 10 MG/ML IJ SOLN
40.0000 mg | Freq: Two times a day (BID) | INTRAMUSCULAR | Status: DC
  Administered 2016-11-09 (×2): 40 mg via INTRAVENOUS
  Filled 2016-11-08 (×2): qty 4

## 2016-11-08 MED ORDER — ENOXAPARIN SODIUM 30 MG/0.3ML ~~LOC~~ SOLN
30.0000 mg | SUBCUTANEOUS | Status: DC
  Administered 2016-11-09 – 2016-11-10 (×2): 30 mg via SUBCUTANEOUS
  Filled 2016-11-08 (×2): qty 0.3

## 2016-11-08 MED ORDER — FUROSEMIDE 10 MG/ML IJ SOLN
20.0000 mg | Freq: Every day | INTRAMUSCULAR | Status: DC
  Administered 2016-11-08: 20 mg via INTRAVENOUS
  Filled 2016-11-08: qty 2

## 2016-11-08 NOTE — Progress Notes (Signed)
This RN saw diet order for pt that was incongruent with SLP's recs. After discussion w SLP, DYS2/Thin diet ordered per previous order.

## 2016-11-08 NOTE — Progress Notes (Signed)
MD [name] paged re: pt not being able to sign consent for surgery. Awaiting response.

## 2016-11-08 NOTE — Evaluation (Signed)
Occupational Therapy Evaluation Patient Details Name: Isabel Collins MRN: [id number] DOB: 1940/06/02 Today's Date: 11/08/2016    History of Present Illness 76 yo female admitted due to fall with L femur fx s/p IM nail.  Has h/o acute pyelonephritis, chronic anemia, OA, COPD, anxiety, fibromyalgia, transitional cell carcinoma awaiting R nephrectomy..    Clinical Impression   Pt lethargic, needing increased time to follow one step commands secondary to attention level.  Mumbled speech as well secondary to lethargy.  She required total assist for rolling in the bed to clean up bowel movement.  Did not attempt OOB this session secondary to pt's attention level, but would need +2 assist or use of maximove.      Follow Up Recommendations  SNF;Supervision/Assistance - 24 hour          Precautions / Restrictions Precautions Precautions: Fall Required Braces or Orthoses: Knee Immobilizer - Left Restrictions Weight Bearing Restrictions: Yes LLE Weight Bearing: Non weight bearing      Mobility Bed Mobility Overal bed mobility: Needs Assistance Bed Mobility: Rolling Rolling: Total assist            Transfers                          ADL either performed or assessed with clinical judgement   ADL Overall ADL's : Needs assistance/impaired Eating/Feeding: Minimal assistance;Bed level Eating/Feeding Details (indicate cue type and reason): simulated Grooming: Wash/dry face;Minimal assistance       Lower Body Bathing: Bed level;Total assistance       Lower Body Dressing: Total assistance;Bed level       Toileting- Clothing Manipulation and Hygiene: Bed level;Total assistance         General ADL Comments: Pt continues to need total assist for rolling in bed side to side.  Did not attempt OOB secondary to pt's attention level.  Will need +2 assist for LB selfcare and functional transfers to the 3:1 adhering to weightbearing status.     Vision Baseline  Vision/History: Wears glasses Wears Glasses: At all times Patient Visual Report: No change from baseline Vision Assessment?:  (Not formally assessed secondary to attention. Will continue to assess in function)            Pertinent Vitals/Pain Pain Assessment: Faces Faces Pain Scale: Hurts little more Pain Location: L LE with movement and rolling Pain Descriptors / Indicators: Grimacing Pain Intervention(s): Monitored during session;Limited activity within patient's tolerance     Hand Dominance Right   Extremity/Trunk Assessment Upper Extremity Assessment Upper Extremity Assessment: Generalized weakness;LUE deficits/detail LUE Deficits / Details: Pt with increased edema in the left arm and hand.  AAROM WFLS for shoulder flexion but strength 3-/5.  AROM in all other joints WFLS with strength 3+/5   Lower Extremity Assessment Lower Extremity Assessment: Defer to PT evaluation       Communication Communication Communication: Expressive difficulties (mumbles)   Cognition Arousal/Alertness: Lethargic Behavior During Therapy: Flat affect Overall Cognitive Status: Impaired/Different from baseline Area of Impairment: Orientation;Attention;Following commands                 Orientation Level: Place;Time Current Attention Level: Focused   Following Commands: Follows one step commands inconsistently Safety/Judgement: Decreased awareness of safety     General Comments: Pt with mummbled speech difficult to understand at times secondary to lethargy, kept eyes closed 40% of session.              Home Living Family/patient  expects to be discharged to:: Skilled nursing facility Living Arrangements: Alone Available Help at Discharge: Personal care attendant (3 hours a day every other day) Type of Home: House Home Access: Elevator     Home Layout: One level               Home Equipment: Walker - 2 wheels          Prior Functioning/Environment Level of  Independence: Independent with assistive device(s)        Comments: information from previous chart; pt adamant she came from home, but notes that she went to [facility] after last hosptialization         OT Problem List: Decreased strength;Decreased range of motion;Decreased activity tolerance;Impaired balance (sitting and/or standing);Decreased cognition;Decreased knowledge of use of DME or AE;Decreased knowledge of precautions      OT Treatment/Interventions: Self-care/ADL training;Balance training;Patient/family education;Therapeutic activities;DME and/or AE instruction;Cognitive remediation/compensation    OT Goals(Current goals can be found in the care plan section) Acute Rehab OT Goals Patient Stated Goal: None stated at this time OT Goal Formulation: Patient unable to participate in goal setting Time For Goal Achievement: 11/22/16 Potential to Achieve Goals: Fair  OT Frequency: Min 1X/week   Barriers to D/C: Decreased caregiver support  Pt lived alone per chart          AM-PAC PT "6 Clicks" Daily Activity     Outcome Measure Help from another person eating meals?: A Little Help from another person taking care of personal grooming?: A Little Help from another person toileting, which includes using toliet, bedpan, or urinal?: Total Help from another person bathing (including washing, rinsing, drying)?: A Lot Help from another person to put on and taking off regular upper body clothing?: A Lot Help from another person to put on and taking off regular lower body clothing?: A Lot 6 Click Score: 13   End of Session Nurse Communication: Need for lift equipment  Activity Tolerance: Patient limited by lethargy Patient left: in bed;with call bell/phone within reach;with SCD's reapplied  OT Visit Diagnosis: Muscle weakness (generalized) (M62.81);History of falling (Z91.81);Other symptoms and signs involving cognitive function;Pain Pain - Right/Left: Left Pain - part of  body: Leg                Time: 3244-0102 OT Time Calculation (min): 48 min Charges:  OT General Charges $OT Visit: 1 Visit OT Evaluation $OT Eval Moderate Complexity: 1 Mod OT Treatments $Self Care/Home Management : 38-52 mins    Laci Frenkel OTR/L 11/08/2016, 2:12 PM

## 2016-11-08 NOTE — Progress Notes (Signed)
Pharmacy Antibiotic Note Isabel Collins is a 76 y.o. female admitted on 10/09/2016 with left knee pain/femur fracture and subsequently found to have ESBL E.coli UTI with concomitant bacteremia. Currently on day 11 of meropenem for treatment.    Plan: 1. Continue meropenem 1 gram IV every 12 hours.  Dr. Grandville Silos to discuss length of therapy with ID physician (?14 days for bacteremia) 2. If pt to require antibiotics until nephro-ureterectomy as per urology notes (Dr. [name], 8/28) will likely require IV antibiotics as no PO options available based on c/s for treatment of UTI and bacteremia > could consider ertapenem 1 g q24h    Height: 5\' 2"  (157.5 cm) Weight: 150 lb (68 kg) IBW/kg (Calculated) : 50.1  Temp (24hrs), Avg:97.8 F (36.6 C), Min:97.6 F (36.4 C), Max:97.9 F (36.6 C)   Recent Labs Lab 11/05/16 0313 11/15/2016 0510 11/07/2016 0815 11/07/2016 0821 11/07/16 0950 11/08/16 1046  WBC 17.0* 14.4*  --  17.4* 16.3* 18.3*  CREATININE 1.08*  --  1.12* 1.12* 1.25* 1.44*    Estimated Creatinine Clearance: 29.6 mL/min (A) (by C-G formula based on SCr of 1.44 mg/dL (H)).    No Known Allergies  Antimicrobials this admission: . 8/27 meropenem >>   . 8/27 vanc x1 *Daily fluconazole per urology as grew yeast as outpt   Microbiology results: 8/27 BCx: ESBL E.coli 8/27 UCx: ESBL. E.coli  8/27 MRSA PCR: neg 8/29 BCx: neg 9/5 urine: neg  Thank you for allowing pharmacy to be a part of this patient's care.   [employer], Pharm.D., BCPS Clinical Pharmacist Pager: [phone] Clinical phone for 11/08/2016 from 8:30-4:00 is [phone]. After 4pm, please call Main Rx [phone]) for assistance. 11/08/2016 4:58 PM

## 2016-11-08 NOTE — Progress Notes (Signed)
   Assessment / Plan: 2 Days Post-Op  S/P Procedure(s) (LRB): INTRAMEDULLARY (IM) RETROGRADE FEMORAL NAILING (Left) by Dr. Ernesta Amble. [name] on 12/01/2016  Principal Problem:   Traumatic closed displaced supracondylar fracture of left femur ([place]) Active Problems:   Hypertension   Physical deconditioning   Hypothyroidism   Chronic obstructive pulmonary disease (HCC)   Type 2 diabetes mellitus with complication, without long-term current use of insulin (HCC)   Urothelial carcinoma of kidney, right (HCC)   Gastroesophageal reflux disease   History of recent fall   Right renal mass   Fracture of knee prosthesis (HCC)   AKI (acute kidney injury) ([place])   UTI due to extended-spectrum beta lactamase (ESBL) producing Escherichia coli   Acute renal failure (ARF) ([place])   Fall   Sepsis due to Escherichia coli (E. coli) ([place])   Bacteremia due to Gram-negative bacteria   Sepsis secondary to UTI ([place])   Transitional cell carcinoma ([place])  Closed displaced left supracondylar femur fracture Stable from an orthopedic perspective status post IM retrograde femoral nail Much more alert / interactive today  Continue management of comorbidities per medicine/urology - Right Lap Nephroureterectomy planned Minimize narcotics - d/c of Tramadol seems to have helped Mobilize with therapy when able. Incentive Spirometry Apply ice prn  Weight Bearing: Non Weight Bearing (NWB) LLE Dressings: Reinforce prn.  VTE prophylaxis: Lovenox, SCDs, ambulation Dispo: per primary.  Orthopedic issues stable.     Subjective: Intermittent moderate pain left leg.    Objective:   VITALS:   Vitals:   11/07/16 0339 11/07/16 1600 11/07/16 1642 11/07/16 1944  BP: (!) 95/44 (!) 91/52 (!) 100/55 101/60  Pulse: (!) 106 97  96  Resp: 17 16    Temp: 97.8 F (36.6 C) 98 F (36.7 C)  97.9 F (36.6 C)  TempSrc:  Oral  Axillary  SpO2: 92% 95%  93%  Weight:      Height:       CBC Latest Ref Rng & Units 11/07/2016 11/10/2016  11/10/2016  WBC 4.0 - 10.5 K/uL 16.3(H) 17.4(H) 14.4(H)  Hemoglobin 12.0 - 15.0 g/dL 8.6(L) 9.1(L) 8.0(L)  Hematocrit 36.0 - 46.0 % 28.0(L) 31.1(L) 27.0(L)  Platelets 150 - 400 K/uL 90(L) 127(L) QUESTIONABLE RESULTS, RECOMMEND RECOLLECT TO VERIFY   BMP Latest Ref Rng & Units 11/07/2016 11/09/2016 11/23/2016  Glucose 65 - 99 mg/dL 137(H) 139(H) -  BUN 6 - 20 mg/dL 28(H) 25(H) -  Creatinine 0.44 - 1.00 mg/dL 1.25(H) 1.12(H) 1.12(H)  BUN/Creat Ratio 12 - 28 - - -  Sodium 135 - 145 mmol/L 146(H) 146(H) -  Potassium 3.5 - 5.1 mmol/L 4.8 4.4 -  Chloride 101 - 111 mmol/L 120(H) 122(H) -  CO2 22 - 32 mmol/L 14(L) 17(L) -  Calcium 8.9 - 10.3 mg/dL 7.9(L) 8.0(L) -   Intake/Output      09/05 0701 - 09/06 0700   P.O. 120   I.V. (mL/kg) 838.8 (12.8)   IV Piggyback 300   Total Intake(mL/kg) 1258.8 (19.3)   Net +1258.8         Physical Exam: General:  NAD.  Alert.  Resting supine in bed.  Responds appropriately / consistently to questions.   MSK: Knee Immobilizer in place. Sensation intact distally Bilateral Feet cool, edematous  Dorsiflexion/Plantar flexion, EHL, FHL intact Incision: dressings C/D/I   Prudencio Burly III, PA-C 11/08/2016, 6:52 AM

## 2016-11-08 NOTE — Progress Notes (Signed)
PROGRESS NOTE    Isabel Collins  WFU:[id number] DOB: Jul 26, 1940 DOA: 10/14/2016 PCP: [name], MD    Brief Narrative:  Patient is 76 year old female history of COPD, diabetes, hypertension, hyperlipidemia recent diagnosis of right upper pole transitional cell carcinoma was seen by urology with recent uteroscopic/biopsy and stent placement was supposed to have a nephrectomy 3 days prior to admission presented to the ED from SNF after fall with left knee injury. Patient with traumatic closed displaced supracondylar fracture of the left femur. Patient seen by orthopedics and underwent IM nailing 11/25/2016. Hospitalization complicated by bacteremia.   Assessment & Plan:   Principal Problem:   Traumatic closed displaced supracondylar fracture of left femur ([place]) Active Problems:   Hypertension   Physical deconditioning   Hypothyroidism   Chronic obstructive pulmonary disease (HCC)   Type 2 diabetes mellitus with complication, without long-term current use of insulin (HCC)   Urothelial carcinoma of kidney, right (HCC)   Gastroesophageal reflux disease   History of recent fall   Right renal mass   Fracture of knee prosthesis (HCC)   AKI (acute kidney injury) ([place])   UTI due to extended-spectrum beta lactamase (ESBL) producing Escherichia coli   Acute renal failure (ARF) ([place])   Fall   Sepsis due to Escherichia coli (E. 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 Patient status post IM nail 20/25/4270 with no complications. Per orthopedics.  #2 Escherichia coli bacteremia/Escherichia coli UTI Patient currently afebrile. Repeat blood cultures with no growth to date. Patient afebrile however with blood pressure that is borderline. Continue IV meropenem. Will need to discuss with ID in the next day about  antibiotic duration and recommendations. Per urology patient to continue empiric IV antibiotics until patient undergoes surgery for nephrectomy. Follow for now.  #3 constipation Felt likely secondary to pain regimen. Improved with Dulcolax suppository.  #4 Sepsis presented on admission with multidrug resistant Escherichia coli/ multidrug resistant Escherichia coli UTI in the setting of right upper pole transitional cell carcinoma pending nephrectomy -  blood cultures consistent with Escherichia coli as well as urine cultures consistent with Escherichia coli. Escherichia coli multidrug resistant. Repeat blood cultures with no growth to date. Continue IV Merrem. Will need to discuss with ID in terms of antibiotic duration and recommendations.  #5 encephalopathy Felt to be multifactorial secondary to acute sepsis and probable opioid pain medication. Patient somewhat drowsy however alert and following commands. Ultram has been discontinued. Tylenol as needed for pain. Place on IV diuretics as patient volume overloaded.   #6 acute kidney injury on chronic kidney disease stage II Felt to be initially secondary to sepsis/acute infection. Renal ultrasound negative for any hydro-nephrosis. Foley catheter inserted. Urine output not been recorded. Patient volume overloaded on examination. Will place on low-dose IV diuretics and follow.   #7 hyperlipidemia Continue statin.  #8 metabolic acidosis Decreased rate of IV bicarbonate to 50 mL per hour.   #9 hypertension Blood pressure borderline. Monitor closely with diuretics. Follow.  #10 borderline type 2 diabetes mellitus Sliding scale insulin.  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 Per urology note from 10/30/2016 patient's surgery will be rescheduled in approximately 2 weeks from time of femur repair. Continue IV antibiotics  until surgery for  nephrectomy per urology recommendations.  #12 volume overload Patient significantly volume overloaded on examination. Patient's blood pressure is borderline. Place on Lasix 20 mg IV daily and monitor blood pressure closely.   DVT prophylaxis: Lovenox Code Status: Full Family Communication: Updated patient. No family at bedside. Disposition Plan: Likely skilled nursing facility when medically stable.   Consultants:   Orthopedics: Dr. [name] 10/30/2016  Urology progress note Dr. [name] 10/30/2016  Procedures:  CT chest 10/30/2016  Plain films of the pelvis 10/11/2016  Playful of the left knee 10/07/2016  Renal ultrasound 10/11/2016  Intramedullary retrograde femoral nailing per Dr. [name] 11/23/2016  Antimicrobials:   IV Ancef 10/30/2016>>>>> 11/07/2016  IV Merrem 10/15/2016>>>>  IV Zosyn a 27 [id number] dose.  IV vancomycin   Subjective: Patient is drowsy however easily arousable. Answering questions. No chest pain. No shortness of breath. Patient alert to self and place.   Objective: Vitals:   11/08/16 0500 11/08/16 0702 11/08/16 1108 11/08/16 1440  BP:  104/62 98/66 (!) 104/50  Pulse:  95  79  Resp:  20  16  Temp:  97.6 F (36.4 C)  97.9 F (36.6 C)  TempSrc:  Axillary  Axillary  SpO2:    96%  Weight: 68 kg (150 lb)     Height:        Intake/Output Summary (Last 24 hours) at 11/08/16 1916 Last data filed at 11/08/16 1500  Gross per 24 hour  Intake             2030 ml  Output                0 ml  Net             2030 ml   Filed Weights   10/23/2016 1233 11/22/2016 0948 11/08/16 0500  Weight: 65.3 kg (144 lb) 65.3 kg (144 lb) 68 kg (150 lb)    Examination:  General exam: Lethargy. Easily arousable.  Respiratory system: Decreased breath sounds in the bases. Respiratory effort normal. Cardiovascular system: S1 & S2 heard, RRR. No JVD, murmurs, rubs, gallops or clicks. 3+ bilateral lower extremity edema. Gastrointestinal system:  Abdomen is nondistended, soft and nontender. No organomegaly or masses felt. Normal bowel sounds heard. Central nervous system: Patient drowsy easily arousable. No focal deficits.  Extremities: Knee immobilizer on left lower extremity. Symmetric 5 x 5 power. Skin: No rashes, lesions or ulcers Psychiatry: Judgement and insight appear normal. Mood & affect appropriate.     Data Reviewed: I have personally reviewed following labs and imaging studies  CBC:  Recent Labs Lab 11/05/16 0313 11/10/2016 0510 11/07/2016 0821 11/07/16 0950 11/08/16 1046  WBC 17.0* 14.4* 17.4* 16.3* 18.3*  NEUTROABS  --   --   --   --  15.6*  HGB 9.2* 8.0* 9.1* 8.6* 9.1*  HCT 31.3* 27.0* 31.1* 28.0* 30.1*  MCV 86.2 86.3 86.9 85.9 86.2  PLT 140* QUESTIONABLE RESULTS, RECOMMEND RECOLLECT TO VERIFY 127* 90* 300*   Basic Metabolic Panel:  Recent Labs Lab 11/03/16 0016 11/05/16 0313 11/13/2016 0815 11/16/2016 0821 11/07/16 0950 11/08/16 1046  NA 140 144  --  146* 146* 147*  K 5.1 4.2  --  4.4 4.8 4.9  CL 114* 116*  --  122* 120* 120*  CO2 15* 18*  --  17* 14* 17*  GLUCOSE 134* 144*  --  139* 137* 111*  BUN 31* 25*  --  25* 28* 34*  CREATININE 0.94 1.08* 1.12* 1.12* 1.25* 1.44*  CALCIUM 8.0* 8.0*  --  8.0* 7.9* 8.0*  MG  --   --   --   --   --  2.9*   GFR: Estimated Creatinine Clearance: 29.6 mL/min (A) (by C-G formula based on SCr of 1.44 mg/dL (H)). Liver Function Tests:  Recent Labs Lab 11/07/16 0950 11/08/16 1046  AST 69* 62*  ALT 12* 11*  ALKPHOS 94 104  BILITOT 0.4 0.7  PROT 5.1* 5.1*  ALBUMIN 1.4* 1.6*   No results for input(s): LIPASE, AMYLASE in the last 168 hours. No results for input(s): AMMONIA in the last 168 hours. Coagulation Profile: No results for input(s): INR, PROTIME in the last 168 hours. Cardiac Enzymes: No results for input(s): CKTOTAL, CKMB, CKMBINDEX, TROPONINI in the last 168 hours. BNP (last 3 results) No results for input(s): PROBNP in the last 8760  hours. HbA1C: No results for input(s): HGBA1C in the last 72 hours. CBG:  Recent Labs Lab 11/07/16 1718 11/07/16 2257 11/08/16 0753 11/08/16 1221 11/08/16 1715  GLUCAP 122* 111* 90 115* 105*   Lipid Profile: No results for input(s): CHOL, HDL, LDLCALC, TRIG, CHOLHDL, LDLDIRECT in the last 72 hours. Thyroid Function Tests: No results for input(s): TSH, T4TOTAL, FREET4, T3FREE, THYROIDAB in the last 72 hours. Anemia Panel: No results for input(s): VITAMINB12, FOLATE, FERRITIN, TIBC, IRON, RETICCTPCT in the last 72 hours. Sepsis Labs: No results for input(s): PROCALCITON, LATICACIDVEN in the last 168 hours.  Recent Results (from the past 240 hour(s))  Surgical pcr screen     Status: None   Collection Time: 10/30/2016  9:08 PM  Result Value Ref Range Status   MRSA, PCR NEGATIVE NEGATIVE Final   Staphylococcus aureus NEGATIVE NEGATIVE Final    Comment:        The Xpert SA Assay (FDA approved for NASAL specimens in patients over 31 years of age), is one component of a comprehensive surveillance program.  Test performance has been validated by [facility] for patients greater than or equal to 16 year old. It is not intended to diagnose infection nor to guide or monitor treatment.   Culture, blood (routine x 2)     Status: None   Collection Time: 10/31/16  5:30 AM  Result Value Ref Range Status   Specimen Description BLOOD LEFT ASSIST CONTROL  Final   Special Requests IN PEDIATRIC BOTTLE Blood Culture adequate volume  Final   Culture NO GROWTH 5 DAYS  Final   Report Status 11/05/2016 FINAL  Final  Culture, blood (routine x 2)     Status: None   Collection Time: 10/31/16  5:41 AM  Result Value Ref Range Status   Specimen Description BLOOD LEFT HAND  Final   Special Requests IN PEDIATRIC BOTTLE Blood Culture adequate volume  Final   Culture NO GROWTH 5 DAYS  Final   Report Status 11/05/2016 FINAL  Final  Urine culture     Status: None   Collection Time: 11/07/16  8:10 AM   Result Value Ref Range Status   Specimen Description URINE, CLEAN CATCH  Final   Special Requests NONE  Final   Culture NO GROWTH  Final   Report Status 11/08/2016 FINAL  Final         Radiology Studies: No results found.      Scheduled Meds: . atorvastatin  40 mg Oral QHS  . cycloSPORINE  1 drop Both Eyes BID  . DULoxetine  30 mg Oral q1800  . DULoxetine  60 mg Oral Daily  . [START ON 11/09/2016] enoxaparin (LOVENOX) injection  30 mg Subcutaneous Q24H  . feeding supplement (ENSURE ENLIVE)  237 mL Oral BID BM  . fluconazole  100 mg Oral Daily  . furosemide  20 mg Intravenous Daily  . insulin aspart  0-9 Units Subcutaneous TID WC  . levothyroxine  75 mcg Oral QAC breakfast  . mometasone-formoterol  2 puff Inhalation BID  . pantoprazole  40 mg Oral Daily  . pregabalin  150 mg Oral BID   Continuous Infusions: . meropenem (MERREM) IV 1 g (11/08/16 1750)  .  sodium bicarbonate  infusion 1000 mL Stopped (11/08/16 1753)     LOS: 10 days    Time spent: 25 minutes    THOMPSON,DANIEL, MD [facility] Pager [phone]  If 7PM-7AM, please contact night-coverage [email or web address] Password [email or web address] 11/08/2016, 7:16 PM

## 2016-11-09 ENCOUNTER — Inpatient Hospital Stay (HOSPITAL_COMMUNITY): Payer: Medicare Other

## 2016-11-09 DIAGNOSIS — K219 Gastro-esophageal reflux disease without esophagitis: Secondary | ICD-10-CM

## 2016-11-09 DIAGNOSIS — J189 Pneumonia, unspecified organism: Secondary | ICD-10-CM

## 2016-11-09 DIAGNOSIS — G9340 Encephalopathy, unspecified: Secondary | ICD-10-CM

## 2016-11-09 DIAGNOSIS — D72829 Elevated white blood cell count, unspecified: Secondary | ICD-10-CM

## 2016-11-09 LAB — BASIC METABOLIC PANEL
Anion gap: 12 (ref 5–15)
BUN: 38 mg/dL — ABNORMAL HIGH (ref 6–20)
CALCIUM: 8.2 mg/dL — AB (ref 8.9–10.3)
CO2: 19 mmol/L — AB (ref 22–32)
CREATININE: 1.42 mg/dL — AB (ref 0.44–1.00)
Chloride: 116 mmol/L — ABNORMAL HIGH (ref 101–111)
GFR calc non Af Amer: 35 mL/min — ABNORMAL LOW (ref >60)
GFR, EST AFRICAN AMERICAN: 40 mL/min — AB (ref >60)
Glucose, Bld: 82 mg/dL (ref 65–99)
Potassium: 5.5 mmol/L — ABNORMAL HIGH (ref 3.5–5.1)
SODIUM: 147 mmol/L — AB (ref 135–145)

## 2016-11-09 LAB — GLUCOSE, CAPILLARY
GLUCOSE-CAPILLARY: 73 mg/dL (ref 65–99)
Glucose-Capillary: 110 mg/dL — ABNORMAL HIGH (ref 65–99)
Glucose-Capillary: 118 mg/dL — ABNORMAL HIGH (ref 65–99)

## 2016-11-09 LAB — BLOOD GAS, ARTERIAL
ACID-BASE DEFICIT: 0.1 mmol/L (ref 0.0–2.0)
Bicarbonate: 23.4 mmol/L (ref 20.0–28.0)
DRAWN BY: 308601
O2 Content: 2 L/min
O2 SAT: 98.6 %
PH ART: 7.456 — AB (ref 7.350–7.450)
Patient temperature: 98.6
pCO2 arterial: 33.7 mmHg (ref 32.0–48.0)
pO2, Arterial: 119 mmHg — ABNORMAL HIGH (ref 83.0–108.0)

## 2016-11-09 LAB — CBC
HCT: 32.2 % — ABNORMAL LOW (ref 36.0–46.0)
HEMOGLOBIN: 9.5 g/dL — AB (ref 12.0–15.0)
MCH: 25.4 pg — AB (ref 26.0–34.0)
MCHC: 29.5 g/dL — AB (ref 30.0–36.0)
MCV: 86.1 fL (ref 78.0–100.0)
Platelets: 178 10*3/uL (ref 150–400)
RBC: 3.74 MIL/uL — ABNORMAL LOW (ref 3.87–5.11)
RDW: 18.2 % — AB (ref 11.5–15.5)
WBC: 21.8 10*3/uL — ABNORMAL HIGH (ref 4.0–10.5)

## 2016-11-09 LAB — POTASSIUM: Potassium: 5 mmol/L (ref 3.5–5.1)

## 2016-11-09 LAB — AMMONIA: AMMONIA: 15 umol/L (ref 9–35)

## 2016-11-09 LAB — LACTIC ACID, PLASMA: LACTIC ACID, VENOUS: 4.1 mmol/L — AB (ref 0.5–1.9)

## 2016-11-09 MED ORDER — FUROSEMIDE 10 MG/ML IJ SOLN
40.0000 mg | Freq: Once | INTRAMUSCULAR | Status: AC
  Administered 2016-11-09: 40 mg via INTRAVENOUS
  Filled 2016-11-09: qty 4

## 2016-11-09 MED ORDER — FUROSEMIDE 10 MG/ML IJ SOLN
60.0000 mg | Freq: Two times a day (BID) | INTRAMUSCULAR | Status: DC
  Administered 2016-11-09 – 2016-11-10 (×2): 60 mg via INTRAVENOUS
  Filled 2016-11-09 (×2): qty 6

## 2016-11-09 MED ORDER — METOLAZONE 5 MG PO TABS
5.0000 mg | ORAL_TABLET | Freq: Once | ORAL | Status: AC
  Administered 2016-11-09: 5 mg via ORAL
  Filled 2016-11-09: qty 1

## 2016-11-09 MED ORDER — IOPAMIDOL (ISOVUE-300) INJECTION 61%
INTRAVENOUS | Status: AC
  Administered 2016-11-09: 100 mL
  Filled 2016-11-09: qty 30

## 2016-11-09 MED ORDER — ALBUMIN HUMAN 25 % IV SOLN
50.0000 g | Freq: Once | INTRAVENOUS | Status: AC
  Administered 2016-11-09: 50 g via INTRAVENOUS
  Filled 2016-11-09: qty 50

## 2016-11-09 MED ORDER — FUROSEMIDE 10 MG/ML IJ SOLN: 60.0000 mg | Freq: Two times a day (BID) | INTRAMUSCULAR | Status: DC

## 2016-11-09 NOTE — Progress Notes (Signed)
Manual BP is 78/58 with 2 RN confirmations, Patriciaann Clan., RN and Magda Paganini, RN Rapid Response RN.

## 2016-11-09 NOTE — Progress Notes (Signed)
PROGRESS NOTE    Isabel Collins  FBP:[id number] DOB: 03-23-1940 DOA: 10/23/2016 PCP: [name], MD    Brief Narrative:  Patient is 76 year old female history of COPD, diabetes, hypertension, hyperlipidemia recent diagnosis of right upper pole transitional cell carcinoma was seen by urology with recent uteroscopic/biopsy and stent placement was supposed to have a nephrectomy 3 days prior to admission presented to the ED from SNF after fall with left knee injury. Patient with traumatic closed displaced supracondylar fracture of the left femur. Patient seen by orthopedics and underwent IM nailing 11/09/2016. Hospitalization complicated by bacteremia.   Assessment & Plan:   Principal Problem:   Traumatic closed displaced supracondylar fracture of left femur ([place]) Active Problems:   Hypertension   Physical deconditioning   Hypothyroidism   Chronic obstructive pulmonary disease (HCC)   Type 2 diabetes mellitus with complication, without long-term current use of insulin (HCC)   Urothelial carcinoma of kidney, right (HCC)   Gastroesophageal reflux disease   History of recent fall   Right renal mass   Fracture of knee prosthesis (HCC)   AKI (acute kidney injury) ([place])   UTI due to extended-spectrum beta lactamase (ESBL) producing Escherichia coli   Acute renal failure (ARF) ([place])   Fall   Sepsis due to Escherichia coli (E. 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 Patient status post IM nail 82/42/3536 with no complications. Patient with a worsening leukocytosis. Patient with encephalopathy. Will get a CT of the left femur as well as the left abdomen and pelvis to rule out abscess formation. Continue empiric IV antibiotics for now. Per orthopedics.  #2 Escherichia coli bacteremia/Escherichia coli  UTI Patient currently afebrile. Repeat blood cultures with no growth to date. Patient afebrile however with blood pressure that is borderline. Worsening leukocytosis. Continue IV meropenem. See #4. Will need to discuss with ID in the next day about antibiotic duration and recommendations. Per urology patient to continue empiric IV antibiotics until patient undergoes surgery for nephrectomy. Follow for now.  #3 constipation Felt likely secondary to pain regimen. Improved with Dulcolax suppository.  #4 Sepsis presented on admission with multidrug resistant Escherichia coli/ multidrug resistant Escherichia coli UTI in the setting of right upper pole transitional cell carcinoma pending nephrectomy -  blood cultures consistent with Escherichia coli as well as urine cultures consistent with Escherichia coli. Escherichia coli multidrug resistant. Repeat blood cultures with no growth to date. Patient with a worsening leukocytosis. Will get a CT of the abdomen and pelvis as well as CT of the left femur to rule out abscess formation. Continue IV Merrem for now with increasing leukocytosis. Will need to discuss with ID in terms of antibiotic duration and recommendations.  #5 encephalopathy Felt to be multifactorial secondary to acute sepsis and probable opioid pain medication. Patient somewhat still drowsy. Ultram has been discontinued. Check a head CT. Check an ammonia level. Check a chest x-ray. Check a CT of the left femur as well as CT abdomen and pelvis to rule out abscess formation as patient also with a leukocytosis. Check ABG. Monitor closely with diuresis.   #6 acute kidney injury on chronic kidney disease stage II Felt to be initially secondary to sepsis/acute infection. Renal ultrasound negative for any hydro-nephrosis. Foley catheter inserted. Urine output is poor. Patient volume overloaded on examination. Increase Lasix to 60  mg IV every 12 hours and add dose of Zaroxolyn.   #7  hyperlipidemia Continue statin.  #8 metabolic acidosis Improving with IV bicarbonate.  #9 hypertension Blood pressure borderline. Monitor closely with diuretics. Follow.  #10 borderline type 2 diabetes mellitus Sliding scale insulin.  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 Per urology note from 10/30/2016 patient's surgery will be rescheduled in approximately 2 weeks from time of femur repair. Continue IV antibiotics until surgery for nephrectomy per urology recommendations.  #12 volume overload Patient significantly volume overloaded on examination. Patient with significant lower extremity edema. Patient is +11 L during this hospitalization. Increase Lasix to 60 mg IV every 12 hours one-time dose of Zaroxolyn. Monitor blood pressure closely as blood pressure is borderline. Patient's blood pressure is borderline.    DVT prophylaxis: Lovenox Code Status: Full Family Communication: Updated patient. No family at bedside. Disposition Plan: Likely skilled nursing facility when medically stable.   Consultants:   Orthopedics: Dr. [name] 10/30/2016  Urology progress note Dr. [name] 10/30/2016  Procedures:  CT chest 10/30/2016  Plain films of the pelvis 10/05/2016  Playful of the left knee 10/28/2016  Renal ultrasound 10/31/2016  Intramedullary retrograde femoral nailing per Dr. [name] 11/22/2016  Antimicrobials:   IV Ancef 10/30/2016>>>>> 11/07/2016  IV Merrem 10/22/2016>>>>  IV Zosyn a 27 [id number] dose.  IV vancomycin   Subjective: Patient is drowsy. Not really answering questions. Denies chest pain. No shortness of breath. Patient with poor urine output however looks significantly volume overloaded.   Objective: Vitals:   11/09/16 0500 11/09/16 0537 11/09/16 1224 11/09/16 1415  BP:  (!) 94/51  (!) 93/40  Pulse:  (!) 102  (!) 110  Resp:  16   16  Temp:  97.8 F (36.6 C)  98 F (36.7 C)  TempSrc:  Oral    SpO2:  97%  91%  Weight: 86.2 kg (190 lb)  86.4 kg (190 lb 8 oz)   Height:        Intake/Output Summary (Last 24 hours) at 11/09/16 1420 Last data filed at 11/09/16 1336  Gross per 24 hour  Intake          1111.25 ml  Output              350 ml  Net           761.25 ml   Filed Weights   11/08/16 0500 11/09/16 0500 11/09/16 1224  Weight: 68 kg (150 lb) 86.2 kg (190 lb) 86.4 kg (190 lb 8 oz)    Examination:  General exam: Lethargy. Easily arousable.  Respiratory system: Decreased breath sounds in the bases. Respiratory effort normal. Cardiovascular system: S1 & S2 heard, RRR. No JVD, murmurs, rubs, gallops or clicks. 3+ bilateral lower extremity edema. Gastrointestinal system: Abdomen is nondistended, soft and nontender. No organomegaly or masses felt. Normal bowel sounds heard. Central nervous system: Patient drowsy. No focal deficits.  Extremities: Knee immobilizer on left lower extremity. Symmetric 5 x 5 power. Skin: No rashes, lesions or ulcers Psychiatry: Judgement and insight appear poor. Mood & affect appropriate.     Data Reviewed: I have personally reviewed following labs and imaging studies  CBC:  Recent Labs Lab 11/17/2016 0510 11/22/2016 0821 11/07/16 0950 11/08/16 1046 11/09/16 0936  WBC 14.4* 17.4* 16.3* 18.3* 21.8*  NEUTROABS  --   --   --  15.6*  --   HGB 8.0* 9.1*  8.6* 9.1* 9.5*  HCT 27.0* 31.1* 28.0* 30.1* 32.2*  MCV 86.3 86.9 85.9 86.2 86.1  PLT QUESTIONABLE RESULTS, RECOMMEND RECOLLECT TO VERIFY 127* 90* 128* 132   Basic Metabolic Panel:  Recent Labs Lab 11/05/16 0313 11/09/2016 0815 11/28/2016 0821 11/07/16 0950 11/08/16 1046 11/09/16 0530 11/09/16 0936  NA 144  --  146* 146* 147* 147*  --   K 4.2  --  4.4 4.8 4.9 5.5* 5.0  CL 116*  --  122* 120* 120* 116*  --   CO2 18*  --  17* 14* 17* 19*  --   GLUCOSE 144*  --  139* 137* 111* 82  --   BUN 25*  --  25* 28* 34* 38*  --    CREATININE 1.08* 1.12* 1.12* 1.25* 1.44* 1.42*  --   CALCIUM 8.0*  --  8.0* 7.9* 8.0* 8.2*  --   MG  --   --   --   --  2.9*  --   --    GFR: Estimated Creatinine Clearance: 33.8 mL/min (A) (by C-G formula based on SCr of 1.42 mg/dL (H)). Liver Function Tests:  Recent Labs Lab 11/07/16 0950 11/08/16 1046  AST 69* 62*  ALT 12* 11*  ALKPHOS 94 104  BILITOT 0.4 0.7  PROT 5.1* 5.1*  ALBUMIN 1.4* 1.6*   No results for input(s): LIPASE, AMYLASE in the last 168 hours. No results for input(s): AMMONIA in the last 168 hours. Coagulation Profile: No results for input(s): INR, PROTIME in the last 168 hours. Cardiac Enzymes: No results for input(s): CKTOTAL, CKMB, CKMBINDEX, TROPONINI in the last 168 hours. BNP (last 3 results) No results for input(s): PROBNP in the last 8760 hours. HbA1C: No results for input(s): HGBA1C in the last 72 hours. CBG:  Recent Labs Lab 11/08/16 1221 11/08/16 1715 11/08/16 2251 11/09/16 0834 11/09/16 1220  GLUCAP 115* 105* 71 73 110*   Lipid Profile: No results for input(s): CHOL, HDL, LDLCALC, TRIG, CHOLHDL, LDLDIRECT in the last 72 hours. Thyroid Function Tests: No results for input(s): TSH, T4TOTAL, FREET4, T3FREE, THYROIDAB in the last 72 hours. Anemia Panel: No results for input(s): VITAMINB12, FOLATE, FERRITIN, TIBC, IRON, RETICCTPCT in the last 72 hours. Sepsis Labs: No results for input(s): PROCALCITON, LATICACIDVEN in the last 168 hours.  Recent Results (from the past 240 hour(s))  Culture, blood (routine x 2)     Status: None   Collection Time: 10/31/16  5:30 AM  Result Value Ref Range Status   Specimen Description BLOOD LEFT ASSIST CONTROL  Final   Special Requests IN PEDIATRIC BOTTLE Blood Culture adequate volume  Final   Culture NO GROWTH 5 DAYS  Final   Report Status 11/05/2016 FINAL  Final  Culture, blood (routine x 2)     Status: None   Collection Time: 10/31/16  5:41 AM  Result Value Ref Range Status   Specimen Description  BLOOD LEFT HAND  Final   Special Requests IN PEDIATRIC BOTTLE Blood Culture adequate volume  Final   Culture NO GROWTH 5 DAYS  Final   Report Status 11/05/2016 FINAL  Final  Urine culture     Status: None   Collection Time: 11/07/16  8:10 AM  Result Value Ref Range Status   Specimen Description URINE, CLEAN CATCH  Final   Special Requests NONE  Final   Culture NO GROWTH  Final   Report Status 11/08/2016 FINAL  Final         Radiology Studies: No results found.  Scheduled Meds: . atorvastatin  40 mg Oral QHS  . cycloSPORINE  1 drop Both Eyes BID  . DULoxetine  30 mg Oral q1800  . DULoxetine  60 mg Oral Daily  . enoxaparin (LOVENOX) injection  30 mg Subcutaneous Q24H  . feeding supplement (ENSURE ENLIVE)  237 mL Oral BID BM  . fluconazole  100 mg Oral Daily  . furosemide  40 mg Intravenous Q12H  . insulin aspart  0-9 Units Subcutaneous TID WC  . levothyroxine  75 mcg Oral QAC breakfast  . mometasone-formoterol  2 puff Inhalation BID  . pantoprazole  40 mg Oral Daily  . pregabalin  150 mg Oral BID   Continuous Infusions: . meropenem (MERREM) IV 1 g (11/09/16 0549)  .  sodium bicarbonate  infusion 1000 mL 50 mL/hr at 11/08/16 1918     LOS: 11 days    Time spent: 58 minutes    THOMPSON,DANIEL, MD [facility] Pager [phone]  If 7PM-7AM, please contact night-coverage [email or web address] Password [email or web address] 11/09/2016, 2:20 PM

## 2016-11-09 NOTE — Progress Notes (Signed)
Physical Therapy Treatment Patient Details Name: Isabel Collins MRN: [id number] DOB: [birth year] Today's Date: 11/09/2016    History of Present Illness 76 yo female admitted due to fall with L femur fx s/p IM nail.  Has h/o acute pyelonephritis, chronic anemia, OA, COPD, anxiety, fibromyalgia, transitional cell carcinoma awaiting R nephrectomy..     PT Comments    Pt lethargic with head against bedrail on arrival. Pt oriented to self and not following commands for general mobility other than bending RLE and reaching for rail with hand over hand assist. Pt continues to have posterior lean in sitting and required mod-max assist for trunk support alone. Pt will required maximove for OOB at this time. Will continue to follow and NT present for initial bed mobility and RN present to assess skin issues noted during session.     Follow Up Recommendations  SNF;Supervision/Assistance - 24 hour     Equipment Recommendations  None recommended by PT    Recommendations for Other Services       Precautions / Restrictions Precautions Precautions: Fall Required Braces or Orthoses: Knee Immobilizer - Left Restrictions LLE Weight Bearing: Non weight bearing    Mobility  Bed Mobility Overal bed mobility: Needs Assistance Bed Mobility: Rolling;Supine to Sit;Sit to Supine Rolling: Max assist   Supine to sit: Total assist;+2 for physical assistance Sit to supine: Total assist;+2 for physical assistance   General bed mobility comments: max assist with pad to roll bil for pericare and linent change. Total assist to move legs off of bed and elevate trunk with mod assist for balance EOB grossly 2 min then return to supine  Transfers                 General transfer comment: unable, posterior lean, lack of command following   Ambulation/Gait                 Stairs            Wheelchair Mobility    Modified Rankin (Stroke Patients Only)       Balance Overall balance  assessment: Needs assistance;History of Falls   Sitting balance-Leahy Scale: Zero Sitting balance - Comments: mod to max assist EOB 2 min with cues for anterior translation and safety with RLE supported                                    Cognition Arousal/Alertness: Lethargic Behavior During Therapy: Flat affect Overall Cognitive Status: Impaired/Different from baseline Area of Impairment: Orientation;Attention;Following commands;Memory                 Orientation Level: Place;Time;Situation Current Attention Level: Focused Memory: Decreased short-term memory;Decreased recall of precautions Following Commands: Follows one step commands inconsistently Safety/Judgement: Decreased awareness of safety     General Comments: pt maintains eyes closed majority of session and will respond to some simple questions with grossly 50% accuracy      Exercises General Exercises - Lower Extremity Heel Slides: AAROM;Right;Supine;10 reps Straight Leg Raises: AAROM;Left;Supine;10 reps    General Comments        Pertinent Vitals/Pain Pain Assessment:  (PAINAD= 3) Pain Location: pt reports pain over her entire body unable to rate or localize and easily distracted    Home Living                      Prior Function  PT Goals (current goals can now be found in the care plan section) Progress towards PT goals: Not progressing toward goals - comment (due to impaired cognition)    Frequency    Min 3X/week      PT Plan Current plan remains appropriate    Co-evaluation              AM-PAC PT "6 Clicks" Daily Activity  Outcome Measure  Difficulty turning over in bed (including adjusting bedclothes, sheets and blankets)?: Unable Difficulty moving from lying on back to sitting on the side of the bed? : Unable Difficulty sitting down on and standing up from a chair with arms (e.g., wheelchair, bedside commode, etc,.)?: Unable Help needed  moving to and from a bed to chair (including a wheelchair)?: Total Help needed walking in hospital room?: Total Help needed climbing 3-5 steps with a railing? : Total 6 Click Score: 6    End of Session Equipment Utilized During Treatment: Left knee immobilizer Activity Tolerance: Patient limited by lethargy Patient left: in bed;with call bell/phone within reach;with bed alarm set;with nursing/sitter in room Nurse Communication: Mobility status;Need for lift equipment;Other (comment) (skin breakdown with pressure from linens as well as blisters noted on right hip and skin tear on left hip) PT Visit Diagnosis: Muscle weakness (generalized) (M62.81);Difficulty in walking, not elsewhere classified (R26.2);Other abnormalities of gait and mobility (R26.89);History of falling (Z91.81)     Time: 6803-2122 PT Time Calculation (min) (ACUTE ONLY): 27 min  Charges:  $Therapeutic Activity: 23-37 mins                    G Codes:       Elwyn Reach, PT [phone]   Shahara Hartsfield B Charmion Hapke 11/09/2016, 9:47 AM

## 2016-11-09 NOTE — Progress Notes (Signed)
  Speech Language Pathology Treatment: Dysphagia  Patient Details Name: Isabel Collins MRN: [id number] DOB: 1940-04-29 Today's Date: 11/09/2016 Time: 7124-5809 SLP Time Calculation (min) (ACUTE ONLY): 14 min  Assessment / Plan / Recommendation Clinical Impression  Pt is alert and asking for POs, but she still shows impaired oral preparation and clearance. She has prolonged mastication and pocketing of soft, chopped solids that she is not able to clear despite Max cues, attempted lingual sweep, and SLP replacement of pocketed material directly onto pt's tongue. Ultimately, the incompletely formed bolus was manually removed. Given her fluctuating alertness and significant difficulty orally managing solid textures, recommend downgrade to Dys 1 diet and thin liquids to facilitate oral phase and allow for energy conservation. Will continue to follow.   HPI HPI: Pt is a 76 yo female admitted s/p fall with a traumatic closed displaced supracondylar fracture of left femur, s/p IM nail 9/5.  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, HH, GERD      SLP Plan  Continue with current plan of care       Recommendations  Diet recommendations: Dysphagia 1 (puree);Thin liquid Liquids provided via: Cup;Straw Medication Administration: Crushed with puree Supervision: Full supervision/cueing for compensatory strategies;Staff to assist with self feeding Compensations: Slow rate;Small sips/bites;Lingual sweep for clearance of pocketing Postural Changes and/or Swallow Maneuvers: Seated upright 90 degrees;Upright 30-60 min after meal                Oral Care Recommendations: Oral care QID Follow up Recommendations: 24 hour supervision/assistance SLP Visit Diagnosis: Dysphagia, oral phase (R13.11) Plan: Continue with current plan of care       GO                Germain Osgood 11/09/2016, 10:16 AM  Germain Osgood, M.A. CCC-SLP [phone]

## 2016-11-09 NOTE — Progress Notes (Signed)
CRITICAL VALUE ALERT  Critical Value:  Lactic acid 4.1   Date & Time Notied:  9/718 1915   Provider Notified: Kennon Holter   Orders Received/Actions taken: Monitor Pt mental status; if systolic BP drops below 90 or pt mental status changes notify MD

## 2016-11-09 NOTE — Progress Notes (Signed)
Assessment / Plan: 3 Days Post-Op  S/P Procedure(s) (LRB): INTRAMEDULLARY (IM) RETROGRADE FEMORAL NAILING (Left) by Dr. Ernesta Amble. [name] on 11/19/2016  Principal Problem:   Traumatic closed displaced supracondylar fracture of left femur ([place]) Active Problems:   Hypertension   Physical deconditioning   Hypothyroidism   Chronic obstructive pulmonary disease (HCC)   Type 2 diabetes mellitus with complication, without long-term current use of insulin (HCC)   Urothelial carcinoma of kidney, right (HCC)   Gastroesophageal reflux disease   History of recent fall   Right renal mass   Fracture of knee prosthesis (HCC)   AKI (acute kidney injury) ([place])   UTI due to extended-spectrum beta lactamase (ESBL) producing Escherichia coli   Acute renal failure (ARF) ([place])   Fall   Sepsis due to Escherichia coli (E. coli) ([place])   Bacteremia due to Gram-negative bacteria   Sepsis secondary to UTI ([place])   Transitional cell carcinoma (HCC)   Hypervolemia  Closed displaced left supracondylar femur fracture Stable from an orthopedic perspective status post IM retrograde femoral nail  Continue management of comorbid conditions per medicine/urology - Right Lap Nephroureterectomy planned Minimize narcotics dt lethargy Mobilize with therapy when able. Incentive Spirometry Elevate left leg and Apply ice prn  Weight Bearing: Non Weight Bearing (NWB) LLE Dressings: Reinforce prn.   VTE prophylaxis: Lovenox, SCDs, mobilize with therapy Dispo: per primary.  Orthopedic issues stable.   Suture removal and Follow up in the office with Dr. Alain Marion in 1-2 weeks.  Please call with questions.  Subjective: Patient denies pain.  Lethargic and minimally interactive.  Objective:   VITALS:   Vitals:   11/08/16 2217 11/08/16 2259 11/09/16 0500 11/09/16 0537  BP: (!) 94/48   (!) 94/51  Pulse: (!) 103 90  (!) 102  Resp: 18   16  Temp: 98.1 F (36.7 C)   97.8 F (36.6 C)  TempSrc: Oral   Oral  SpO2: (!)  84% 95%  97%  Weight:   86.2 kg (190 lb)   Height:       CBC Latest Ref Rng & Units 11/08/2016 11/07/2016 11/25/2016  WBC 4.0 - 10.5 K/uL 18.3(H) 16.3(H) 17.4(H)  Hemoglobin 12.0 - 15.0 g/dL 9.1(L) 8.6(L) 9.1(L)  Hematocrit 36.0 - 46.0 % 30.1(L) 28.0(L) 31.1(L)  Platelets 150 - 400 K/uL 128(L) 90(L) 127(L)   BMP Latest Ref Rng & Units 11/08/2016 11/07/2016 11/03/2016  Glucose 65 - 99 mg/dL 111(H) 137(H) 139(H)  BUN 6 - 20 mg/dL 34(H) 28(H) 25(H)  Creatinine 0.44 - 1.00 mg/dL 1.44(H) 1.25(H) 1.12(H)  BUN/Creat Ratio 12 - 28 - - -  Sodium 135 - 145 mmol/L 147(H) 146(H) 146(H)  Potassium 3.5 - 5.1 mmol/L 4.9 4.8 4.4  Chloride 101 - 111 mmol/L 120(H) 120(H) 122(H)  CO2 22 - 32 mmol/L 17(L) 14(L) 17(L)  Calcium 8.9 - 10.3 mg/dL 8.0(L) 7.9(L) 8.0(L)   Intake/Output      09/06 0701 - 09/07 0700 09/07 0701 - 09/08 0700   P.O. 120    I.V. (mL/kg) 791.3 (9.2)    IV Piggyback 100    Total Intake(mL/kg) 1011.3 (11.7)    Urine (mL/kg/hr) 50 (0)    Total Output 50     Net +961.3          Stool Occurrence 1 x      Physical Exam: General:  NAD.  Responds intermittently to questions / commands.   MSK: Knee Immobilizer in place. Sensation intact distally Bilateral Feet cool, edematous  Dorsiflexion/Plantar  flexion, EHL, FHL intact Incision: dressings C/D/I   Prudencio Burly III, PA-C 11/09/2016, 7:38 AM

## 2016-11-10 ENCOUNTER — Encounter (HOSPITAL_COMMUNITY): Payer: Self-pay | Admitting: Interventional Radiology

## 2016-11-10 ENCOUNTER — Inpatient Hospital Stay (HOSPITAL_COMMUNITY): Payer: Medicare Other

## 2016-11-10 DIAGNOSIS — N179 Acute kidney failure, unspecified: Secondary | ICD-10-CM

## 2016-11-10 DIAGNOSIS — R7881 Bacteremia: Secondary | ICD-10-CM

## 2016-11-10 DIAGNOSIS — Z1612 Extended spectrum beta lactamase (ESBL) resistance: Secondary | ICD-10-CM

## 2016-11-10 DIAGNOSIS — A4151 Sepsis due to Escherichia coli [E. coli]: Principal | ICD-10-CM

## 2016-11-10 DIAGNOSIS — R935 Abnormal findings on diagnostic imaging of other abdominal regions, including retroperitoneum: Secondary | ICD-10-CM | POA: Diagnosis present

## 2016-11-10 DIAGNOSIS — N390 Urinary tract infection, site not specified: Secondary | ICD-10-CM

## 2016-11-10 DIAGNOSIS — S72402A Unspecified fracture of lower end of left femur, initial encounter for closed fracture: Secondary | ICD-10-CM

## 2016-11-10 DIAGNOSIS — N2889 Other specified disorders of kidney and ureter: Secondary | ICD-10-CM

## 2016-11-10 DIAGNOSIS — B9629 Other Escherichia coli [E. coli] as the cause of diseases classified elsewhere: Secondary | ICD-10-CM

## 2016-11-10 DIAGNOSIS — G9340 Encephalopathy, unspecified: Secondary | ICD-10-CM

## 2016-11-10 DIAGNOSIS — N1330 Unspecified hydronephrosis: Secondary | ICD-10-CM | POA: Clinically undetermined

## 2016-11-10 DIAGNOSIS — J90 Pleural effusion, not elsewhere classified: Secondary | ICD-10-CM

## 2016-11-10 DIAGNOSIS — W19XXXD Unspecified fall, subsequent encounter: Secondary | ICD-10-CM

## 2016-11-10 DIAGNOSIS — C689 Malignant neoplasm of urinary organ, unspecified: Secondary | ICD-10-CM

## 2016-11-10 HISTORY — PX: IR NEPHROSTOMY PLACEMENT RIGHT: IMG6064

## 2016-11-10 LAB — BASIC METABOLIC PANEL
ANION GAP: 13 (ref 5–15)
BUN: 42 mg/dL — ABNORMAL HIGH (ref 6–20)
CHLORIDE: 115 mmol/L — AB (ref 101–111)
CO2: 20 mmol/L — AB (ref 22–32)
Calcium: 8.4 mg/dL — ABNORMAL LOW (ref 8.9–10.3)
Creatinine, Ser: 1.55 mg/dL — ABNORMAL HIGH (ref 0.44–1.00)
GFR calc non Af Amer: 31 mL/min — ABNORMAL LOW (ref >60)
GFR, EST AFRICAN AMERICAN: 36 mL/min — AB (ref >60)
GLUCOSE: 94 mg/dL (ref 65–99)
Potassium: 5.1 mmol/L (ref 3.5–5.1)
Sodium: 148 mmol/L — ABNORMAL HIGH (ref 135–145)

## 2016-11-10 LAB — URINALYSIS, ROUTINE W REFLEX MICROSCOPIC
BILIRUBIN URINE: NEGATIVE
Glucose, UA: NEGATIVE mg/dL
KETONES UR: NEGATIVE mg/dL
NITRITE: NEGATIVE
PROTEIN: 30 mg/dL — AB
SPECIFIC GRAVITY, URINE: 1.008 (ref 1.005–1.030)
Squamous Epithelial / LPF: NONE SEEN
pH: 5 (ref 5.0–8.0)

## 2016-11-10 LAB — CBC WITH DIFFERENTIAL/PLATELET
Basophils Absolute: 0 10*3/uL (ref 0.0–0.1)
Basophils Relative: 0 %
Eosinophils Absolute: 0.2 10*3/uL (ref 0.0–0.7)
Eosinophils Relative: 1 %
HEMATOCRIT: 28.4 % — AB (ref 36.0–46.0)
HEMOGLOBIN: 8.3 g/dL — AB (ref 12.0–15.0)
LYMPHS ABS: 2.3 10*3/uL (ref 0.7–4.0)
Lymphocytes Relative: 11 %
MCH: 25.5 pg — AB (ref 26.0–34.0)
MCHC: 29.2 g/dL — AB (ref 30.0–36.0)
MCV: 87.1 fL (ref 78.0–100.0)
MONOS PCT: 10 %
Monocytes Absolute: 2.1 10*3/uL — ABNORMAL HIGH (ref 0.1–1.0)
NEUTROS ABS: 15.9 10*3/uL — AB (ref 1.7–7.7)
NEUTROS PCT: 78 %
Platelets: 143 10*3/uL — ABNORMAL LOW (ref 150–400)
RBC: 3.26 MIL/uL — ABNORMAL LOW (ref 3.87–5.11)
RDW: 18 % — ABNORMAL HIGH (ref 11.5–15.5)
WBC: 20.5 10*3/uL — ABNORMAL HIGH (ref 4.0–10.5)

## 2016-11-10 LAB — GLUCOSE, CAPILLARY
Glucose-Capillary: 91 mg/dL (ref 65–99)
Glucose-Capillary: 94 mg/dL (ref 65–99)
Glucose-Capillary: 78 mg/dL (ref 65–99)
Glucose-Capillary: 68 mg/dL (ref 65–99)
Glucose-Capillary: 89 mg/dL (ref 65–99)

## 2016-11-10 LAB — LACTIC ACID, PLASMA: Lactic Acid, Venous: 3.1 mmol/L (ref 0.5–1.9)

## 2016-11-10 LAB — MRSA PCR SCREENING: MRSA by PCR: NEGATIVE

## 2016-11-10 MED ORDER — WHITE PETROLATUM GEL
Status: AC
  Administered 2016-11-10: 14:00:00
  Filled 2016-11-10: qty 1

## 2016-11-10 MED ORDER — FREE WATER
250.0000 mL | Freq: Three times a day (TID) | Status: DC
  Administered 2016-11-10 – 2016-11-11 (×3): 250 mL via ORAL

## 2016-11-10 MED ORDER — TRAMADOL HCL 50 MG PO TABS
50.0000 mg | ORAL_TABLET | Freq: Once | ORAL | Status: AC
  Administered 2016-11-11: 50 mg via ORAL
  Filled 2016-11-10: qty 1

## 2016-11-10 MED ORDER — MIDAZOLAM HCL 2 MG/2ML IJ SOLN
INTRAMUSCULAR | Status: AC | PRN
  Administered 2016-11-10 (×2): 0.5 mg via INTRAVENOUS

## 2016-11-10 MED ORDER — MIDAZOLAM HCL 2 MG/2ML IJ SOLN
INTRAMUSCULAR | Status: AC
  Filled 2016-11-10: qty 2

## 2016-11-10 MED ORDER — DEXTROSE 50 % IV SOLN
INTRAVENOUS | Status: AC
  Administered 2016-11-10: 25 mL
  Filled 2016-11-10: qty 50

## 2016-11-10 MED ORDER — GELATIN ABSORBABLE 12-7 MM EX MISC
CUTANEOUS | Status: AC
  Filled 2016-11-10: qty 1

## 2016-11-10 MED ORDER — SODIUM CHLORIDE 0.45 % IV SOLN
INTRAVENOUS | Status: DC
  Administered 2016-11-10: 125 mL/h via INTRAVENOUS
  Administered 2016-11-10 – 2016-11-12 (×3): via INTRAVENOUS

## 2016-11-10 MED ORDER — DEXTROSE 5 % IV SOLN: INTRAVENOUS | Status: DC

## 2016-11-10 MED ORDER — FENTANYL CITRATE (PF) 100 MCG/2ML IJ SOLN
INTRAMUSCULAR | Status: AC
  Filled 2016-11-10: qty 2

## 2016-11-10 MED ORDER — LIDOCAINE HCL (PF) 1 % IJ SOLN
INTRAMUSCULAR | Status: AC
  Filled 2016-11-10: qty 30

## 2016-11-10 MED ORDER — IOPAMIDOL (ISOVUE-300) INJECTION 61%
INTRAVENOUS | Status: AC
  Administered 2016-11-10: 15 mL
  Filled 2016-11-10: qty 50

## 2016-11-10 MED ORDER — LIDOCAINE HCL (PF) 1 % IJ SOLN
INTRAMUSCULAR | Status: AC | PRN
  Administered 2016-11-10: 5 mL

## 2016-11-10 MED ORDER — HYDROCODONE-ACETAMINOPHEN 5-325 MG PO TABS: 1.0000 | ORAL_TABLET | Freq: Once | ORAL | Status: DC

## 2016-11-10 NOTE — Sedation Documentation (Signed)
Patient denies pain and is resting comfortably.  

## 2016-11-10 NOTE — Progress Notes (Signed)
Chief Complaint: Patient was seen in consultation today for  Right hydronephrosis at the request of Dr. Diona Fanti  Referring Physician(s): Dr. Franchot Gallo  Supervising Physician: Jacqulynn Cadet  Patient Status: [facility] - In-pt  History of Present Illness: Isabel Collins is a 76 y.o. female with hx of right renal cancer. She had cysto and (R)ureteral stent placed and was in the planning for nephrectomy. However, she has been admitted after a fall with femur fracture. While here, she has had worsening fxn and workup has revealed marked changes of her right kidney since last imaging. She has large hydronephrosis. Urology has seen pt and feels a perc neph tube would be the best option at this poiint. Chart, meds, imaging reviewed. She has been NPO this am except for a few sips of water and ginger ale.  Past Medical History:  Diagnosis Date  . Anemia   . Anxiety   . Chronic constipation   . COPD (chronic obstructive pulmonary disease) ([place])   . Degenerative arthritis of spine   . Diverticulosis of colon   . Fibromyalgia   . GERD (gastroesophageal reflux disease)   . Gross hematuria   . Hiatal hernia   . History of chronic bronchitis   . History of colon polyps   . Hyperlipidemia   . Hypertension   . Hypothyroidism   . IBS (irritable bowel syndrome)   . OA (osteoarthritis)    hips  . Renal mass, right     Past Surgical History:  Procedure Laterality Date  . arm surgery Left   . BREAST REDUCTION SURGERY Bilateral   . CHOLECYSTECTOMY    . CYSTOSCOPY WITH BIOPSY Right 08/27/2016   Procedure: CYSTOSCOPY WITH BIOPSY;  Surgeon: Kathie Rhodes, MD;  Location: [facility];  Service: Urology;  Laterality: Right;  . CYSTOSCOPY WITH RETROGRADE PYELOGRAM, URETEROSCOPY AND STENT PLACEMENT Right 08/27/2016   Procedure: CYSTOSCOPY WITH RIGHT RETROGRADE PYELOGRAM, RIGHT URETEROSCOPY AND STENT PLACEMENT;  Surgeon: Kathie Rhodes, MD;  Location: [facility];  Service: Urology;  Laterality: Right;  . FEMUR IM NAIL Left 11/23/2016   Procedure: INTRAMEDULLARY (IM) RETROGRADE FEMORAL NAILING;  Surgeon: Renette Butters, MD;  Location: [facility];  Service: Orthopedics;  Laterality: Left;  . REPLACEMENT TOTAL KNEE Left   . TOTAL SHOULDER REPLACEMENT Right   . TUBAL LIGATION      Allergies: Patient has no known allergies.  Medications:  Current Facility-Administered Medications:  .  acetaminophen (TYLENOL) tablet 650 mg, 650 mg, Oral, Q4H PRN, Eugenie Filler, MD, 650 mg at 11/09/16 0903 .  albuterol (PROVENTIL) (2.5 MG/3ML) 0.083% nebulizer solution 2.5 mg, 2.5 mg, Nebulization, Q4H PRN, Cruzita Lederer, Costin M, MD .  atorvastatin (LIPITOR) tablet 40 mg, 40 mg, Oral, QHS, Gherghe, Costin M, MD, 40 mg at 11/09/16 2316 .  bisacodyl (DULCOLAX) EC tablet 5 mg, 5 mg, Oral, QHS PRN, Caren Griffins, MD, 5 mg at 11/01/16 2251 .  cycloSPORINE (RESTASIS) 0.05 % ophthalmic emulsion 1 drop, 1 drop, Both Eyes, BID, Caren Griffins, MD, 1 drop at 11/09/16 2316 .  DULoxetine (CYMBALTA) DR capsule 30 mg, 30 mg, Oral, q1800, Caren Griffins, MD, 30 mg at 11/09/16 1655 .  DULoxetine (CYMBALTA) DR capsule 60 mg, 60 mg, Oral, Daily, Caren Griffins, MD, 60 mg at 11/09/16 0904 .  feeding supplement (ENSURE ENLIVE) (ENSURE ENLIVE) liquid 237 mL, 237 mL, Oral, BID BM, Velvet Bathe, MD, 237 mL at 11/09/16 0905 .  fluconazole (DIFLUCAN) tablet 100  mg, 100 mg, Oral, Daily, Caren Griffins, MD, 100 mg at 11/09/16 0904 .  fluticasone (FLONASE) 50 MCG/ACT nasal spray 2 spray, 2 spray, Each Nare, QHS PRN, Cruzita Lederer, Costin M, MD .  free water 250 mL, 250 mL, Oral, Q8H, Eugenie Filler, MD, 250 mL at 11/10/16 0945 .  furosemide (LASIX) injection 60 mg, 60 mg, Intravenous, Q12H, Eugenie Filler, MD, 60 mg at 11/10/16 0850 .  insulin aspart (novoLOG) injection 0-9 Units, 0-9 Units, Subcutaneous, TID WC, Caren Griffins, MD, 1 Units at 11/07/16 1836 .   levothyroxine (SYNTHROID, LEVOTHROID) tablet 75 mcg, 75 mcg, Oral, QAC breakfast, Caren Griffins, MD, 75 mcg at 11/10/16 0852 .  meropenem (MERREM) 1 g in sodium chloride 0.9 % 100 mL IVPB, 1 g, Intravenous, Q12H, Poindexter, Leann T, RPH, Stopped at 11/10/16 0545 .  mometasone-formoterol (DULERA) 200-5 MCG/ACT inhaler 2 puff, 2 puff, Inhalation, BID, Caren Griffins, MD, 2 puff at 11/09/16 2043 .  pantoprazole (PROTONIX) EC tablet 40 mg, 40 mg, Oral, Daily, Caren Griffins, MD, 40 mg at 11/09/16 0903 .  pregabalin (LYRICA) capsule 150 mg, 150 mg, Oral, BID, Caren Griffins, MD, 150 mg at 11/09/16 0905 .  senna-docusate (Senokot-S) tablet 1 tablet, 1 tablet, Oral, QHS PRN, Caren Griffins, MD, 1 tablet at 11/03/16 1048 .  sodium bicarbonate 150 mEq in dextrose 5 % 1,000 mL infusion, , Intravenous, Continuous, Eugenie Filler, MD, Last Rate: 50 mL/hr at 11/09/16 1839    Family History  Problem Relation Age of Onset  . Diabetes Mother   . Heart disease Mother   . Diabetes Father   . Heart disease Father   . Diabetes Sister   . Diabetes Brother        x3  . Colon cancer Neg Hx   . Esophageal cancer Neg Hx   . Stomach cancer Neg Hx   . Pancreatic cancer Neg Hx     Social History   Social History  . Marital status: Divorced    Spouse name: N/A  . Number of children: N/A  . Years of education: N/A   Social History Main Topics  . Smoking status: Former Smoker    Packs/day: 0.50    Years: 46.00    Types: Cigarettes    Quit date: 03/05/2004  . Smokeless tobacco: Never Used  . Alcohol use No  . Drug use: No  . Sexual activity: Not Asked   Other Topics Concern  . None   Social History Narrative  . None     Review of Systems: A 12 point ROS discussed and pertinent positives are indicated in the HPI above.  All other systems are negative.  Review of Systems  Vital Signs: BP 108/80 (BP Location: Left Arm)   Pulse 94   Temp 97.7 F (36.5 C) (Axillary)   Resp  16   Ht 5\' 2"  (1.575 m)   Wt 190 lb 8 oz (86.4 kg)   SpO2 97%   BMI 34.84 kg/m   Physical Exam  Constitutional: She appears well-developed. No distress.  HENT:  Head: Normocephalic.  Mouth/Throat: Oropharynx is clear and moist.  Neck: Normal range of motion. No JVD present. No tracheal deviation present.  Cardiovascular: Normal rate, regular rhythm and normal heart sounds.   Pulmonary/Chest: Effort normal and breath sounds normal. No respiratory distress.  Abdominal: Soft. She exhibits no distension. There is no tenderness.  Neurological:  Pt awake and alert and responds to questions. But there  is some encephalopathy  Skin: Skin is warm.    Mallampati Score:  MD Evaluation Airway: WNL Heart: WNL Abdomen: WNL Chest/ Lungs: WNL ASA  Classification: 3 Mallampati/Airway Score: Two  Imaging: Dg Chest 1 View  Result Date: 11/02/2016 CLINICAL DATA:  [facility] at nursing facility.  Follow-up femur fracture. EXAM: CHEST 1 VIEW COMPARISON:  Chest radiograph May 20, 2016 FINDINGS: Cardiac silhouette is normal. New fullness of the hila with hazy density RIGHT lung. Reticulonodular densities bilaterally. RIGHT midlung zone granuloma unchanged. No pneumothorax. Status post RIGHT shoulder arthroplasty. IMPRESSION: New hilar fullness seen with vascular congestion and lymphadenopathy. New reticulonodular densities and hazy RIGHT lung which may be artifact or layering pleural effusion. Recommend follow-up PA and lateral views the chest when clinically able. Electronically Signed   By: Elon Alas M.D.   On: 10/24/2016 14:07   Dg Pelvis 1-2 Views  Result Date: 10/10/2016 CLINICAL DATA:  Fall. EXAM: PELVIS - 1-2 VIEW COMPARISON:  CT abdomen pelvis dated September 17, 2016. FINDINGS: There is no evidence of pelvic fracture or diastasis. No pelvic bone lesions are seen. Partially visualized right ureteral stent. Degenerative changes of the lower lumbar spine. IMPRESSION: No definite fracture. If  occult hip fracture is suspected or if the patient is unable to bear weight, MRI is the preferred modality for further evaluation. Electronically Signed   By: Titus Dubin M.D.   On: 10/08/2016 14:07   Dg Knee 1-2 Views Left  Result Date: 10/23/2016 CLINICAL DATA:  [facility] at nursing facility.  Follow-up femur fracture. EXAM: LEFT KNEE - 1-2 VIEW COMPARISON:  LEFT femur radiograph October 29, 2016 at 1331 hours FINDINGS: Acute oblique fracture through distal femur involving the superior aspect of the femur hardware, status post total knee arthroplasty. Slight posterior angulation distal bony fragments. No dislocation. Hardware is intact. Expected appearance of the resurfaced patella. Osteopenia without destructive bony lesions. Suppurative patella soft tissue swelling and suspected lipoarthrosis. IMPRESSION: Acute displaced distal femur fracture involving superior aspect of femur hardware, status post total knee arthroplasty. No dislocation. Electronically Signed   By: Elon Alas M.D.   On: 10/08/2016 14:05   Ct Head Wo Contrast  Result Date: 11/09/2016 CLINICAL DATA:  [facility] at skilled nursing facility. History of transitional cell carcinoma. EXAM: CT HEAD WITHOUT CONTRAST TECHNIQUE: Contiguous axial images were obtained from the base of the skull through the vertex without intravenous contrast. COMPARISON:  CT HEAD March 28, 2016 FINDINGS: BRAIN: No intraparenchymal hemorrhage, mass effect nor midline shift. The ventricles and sulci are normal for age. Patchy supratentorial white matter hypodensities less than expected for patient's age, though non-specific are most compatible with chronic small vessel ischemic disease. No acute large vascular territory infarcts. No abnormal extra-axial fluid collections. Basal cisterns are patent. VASCULAR: Mild normal calcific atherosclerosis of the carotid siphons. SKULL: No skull fracture. No significant scalp soft tissue swelling. SINUSES/ORBITS: Mild RIGHT  maxillary sinus mucosal thickening without air-fluid levels. Mastoid air cells are well aerated. The included ocular globes and orbital contents are non-suspicious. OTHER: None. IMPRESSION: 1. Negative noncontrast CT HEAD for age. Electronically Signed   By: Elon Alas M.D.   On: 11/09/2016 23:00   Ct Chest Wo Contrast  Result Date: 10/30/2016 CLINICAL DATA:  Pleural effusion EXAM: CT CHEST WITHOUT CONTRAST TECHNIQUE: Multidetector CT imaging of the chest was performed following the standard protocol without IV contrast. COMPARISON:  Chest radiographs, 10/19/2016. Abdomen pelvis CT, 09/17/2016 FINDINGS: Cardiovascular: Heart is normal in size and configuration. Mild left coronary artery calcifications.  Scattered mild atherosclerotic calcifications along the thoracic aorta and at the origin of the branch vessels. Great vessels normal in caliber. Mediastinum/Nodes: No neck base or axillary masses or adenopathy. No mediastinal or hilar masses or adenopathy. Trachea is patent. Lungs/Pleura: Moderate size right pleural effusion. Trace left pleural effusion. There is dependent opacity most evident in the right lower lobe adjacent to the pleural effusion, most likely all atelectasis. Component of infection is possible but felt less likely. Milder dependent subsegmental atelectasis is noted in the right upper lobe and left lower lobe. There is a densely calcified healed granuloma adjacent to the right major fissure associated with calcified right hilar lymph nodes reflecting healed granulomatous disease. Subtle changes of centrilobular emphysema are noted in the upper lobes. There are no lung masses or suspicious nodules. There is no evidence of pulmonary edema. No pneumothorax. Upper Abdomen: There is a poorly defined masslike structure, which is incompletely imaged, lying above the right kidney and below the right adrenal gland. This may reflect a portion of the right kidney. It was not evident on the prior CT.  There is also poorly defined adenopathy along the gastrohepatic, peripancreatic and very celiac chains. Nodes measure up to 2 cm in short axis. This is new since the prior study. Moderate hiatal hernia, stable from the prior CT. Musculoskeletal: No fracture or acute finding. No osteoblastic or osteolytic lesions. IMPRESSION: 1. Moderate right and trace left pleural effusions. 2. Dependent opacity most evident in the right lower lobe. This is likely all atelectasis. No evidence of pulmonary edema. 3. Healed granulomatous disease with calcified right hilar lymph nodes in a right upper lobe calcified granuloma. 4. Masslike area soft tissue that appears within the superior right perinephric space, associated with upper abdominal adenopathy. These findings are new from the prior CT. Recommend followup chest CT, with contrast if this patient can tolerate iodinated contrast. Aortic Atherosclerosis (ICD10-I70.0) and Emphysema (ICD10-J43.9). Electronically Signed   By: Lajean Manes M.D.   On: 10/30/2016 20:27   Ct Abdomen Pelvis W Contrast  Result Date: 11/09/2016 CLINICAL DATA:  Abdominal pain and fever. Recent diagnosis of right upper pole transitional cell carcinoma. Recent biopsy and stent placement. EXAM: CT ABDOMEN AND PELVIS WITH CONTRAST TECHNIQUE: Multidetector CT imaging of the abdomen and pelvis was performed using the standard protocol following bolus administration of intravenous contrast. CONTRAST:  80 mL Isovue-300 COMPARISON:  09/17/2016 FINDINGS: Lower chest: Moderate bilateral pleural effusions with basilar atelectasis or consolidation. Calcified granuloma in the right lung base. Large esophageal hiatal hernia. Coronary artery and aortic valve calcifications. Hepatobiliary: No focal liver abnormality is seen. Status post cholecystectomy. No biliary dilatation. Pancreas: Unremarkable. No pancreatic ductal dilatation or surrounding inflammatory changes. Spleen: Normal in size without focal abnormality.  Adrenals/Urinary Tract: No adrenal gland nodules. Since the previous study, there is interval development of a large fluid collection involving the right kidney consistent with massive hydronephrosis. This collection measures about 9.9 cm in diameter. There is a ureteral catheter present with the proximal pigtail within this dilated structure. There is compression and thinning of the renal parenchyma. Renal parenchyma was normal in thickness on the prior study. There is interval development of a large amount of soft tissue density in the right renal fossa, extending to the paracaval retroperitoneum and peripancreatic regions as well a celiac axis. Soft tissue density extends along the right psoas muscle surrounding the ureteral stent down to the level of the pelvis. Soft tissue structure around the ureteral stent anterior to the psoas muscle  measures about 4.9 x 7.8 cm in diameter. Enlarged lymph nodes are suggested in the retrocrural and retroperitoneal para-aortic regions. This is demonstrating significant progression since previous study. Differential diagnosis would include large hematomas versus rapid development and progression of lymph node metastasis. No contrast material is demonstrated in the right renal collecting system The left kidney and ureter are unremarkable. The bladder is decompressed. Distal catheter pigtail is present in the bladder. Bladder cannot be evaluated due to decompression. Small amount of gas suggested in the bladder is likely iatrogenic. Stomach/Bowel: Stomach, small bowel, and colon are not abnormally distended. Large amount of stool in the rectum. Scattered stool throughout the remainder the colon. Vascular/Lymphatic: Diffuse aortic and branch vessel calcifications. Extensive lymphadenopathy in the retroperitoneum, celiac axis, and pelvis. Reproductive: Status post hysterectomy. No adnexal masses. Other: There is moderate free fluid in the abdomen and pelvis. No free air is  identified. Prominent diffuse edema throughout the subcutaneous fat of the abdomen and pelvis. Abdominal wall musculature appears intact. Musculoskeletal: Degenerative changes in the spine. No destructive bone lesions. IMPRESSION: 1. A right ureteral catheter has been placed. There is massive dilatation of the right renal collecting system with thinning of the right renal cortex. Large soft tissue mass lesions are demonstrated in the right renal fossa and extending along the course of the ureteral catheter. This likely represents metastatic disease or hematoma. A massively dilated ureter with hematoma could also potentially have this appearance. Prominent lymphadenopathy is suggested in the right pararenal spaces, retroperitoneum, celiac axis, and pelvis. Changes demonstrate significant progression since previous study, possibly indicating rapid progression of metastatic disease. Also could consider lymphoma. 2. Moderate free fluid in the abdomen and pelvis. 3. Diffuse soft tissue edema throughout the subcutaneous fat. 4. Moderate bilateral pleural effusions with basilar atelectasis or consolidation. Electronically Signed   By: Lucienne Capers M.D.   On: 11/09/2016 23:43   Ct Femur Left W Contrast  Result Date: 11/10/2016 CLINICAL DATA:  Left femur fracture status post fall 3 days ago. EXAM: CT OF THE LOWER RIGHT EXTREMITY WITH CONTRAST TECHNIQUE: Multidetector CT imaging of the lower right extremity was performed according to the standard protocol following intravenous contrast administration. COMPARISON:  None. CONTRAST:  167mL ISOVUE-300 IOPAMIDOL (ISOVUE-300) INJECTION 61% FINDINGS: Bones/Joint/Cartilage No left hip fracture or dislocation. Left hip joint space is relatively well maintained. Left total knee arthroplasty without hardware failure or complication. Beam hardening artifact resulting from the hardware partially obscuring the adjacent soft tissue osseous structures. Mildly comminuted, nondisplaced  fracture of the distal femoral metaphysis transfixed with a intramedullary nail. Near anatomic alignment. No hardware failure or complication. Postsurgical changes in the surrounding soft tissues. No other fracture or dislocation. Small knee joint effusion. No aggressive lytic or sclerotic osseous lesion. Normal alignment. No joint effusion. Ligaments Ligaments are suboptimally evaluated by CT. Muscles and Tendons Soft tissue edema in the subcutaneous fat of the left thigh primarily along the lateral aspect likely postsurgical. No fluid collection or hematoma. Quadriceps and patellar tendons are intact. Soft tissue No fluid collection or hematoma.  No soft tissue mass. IMPRESSION: 1. Interval ORIF of a distal left femoral metaphysis fracture in near anatomic alignment. Electronically Signed   By: Kathreen Devoid   On: 11/10/2016 08:12   US Renal  Result Date: 10/30/2016 CLINICAL DATA:  Acute renal failure. EXAM: RENAL / URINARY TRACT ULTRASOUND COMPLETE COMPARISON:  CT 09/17/2016. FINDINGS: Right Kidney: Length: 12.4 cm.  Echogenicity within normal limits. What appears to be the right ureteral stent is  again noted in the region of the renal pelvis. Minimal calyceal dilatation. 5.0 x 6.4 x 4.9 cm hypoechoic mass is in the region of the renal pelvis, reference is made to prior CT report 09/17/2016 and 07/19/2016. Left kidney: Length: 11.9 cm. Echogenicity within normal limits. No mass or hydronephrosis visualized. Bladder: Bladder is nondistended.  Foley catheter in place. IMPRESSION: 1. What appears to be of right ureteral stents again noted the region of the right renal pelvis. Minimal calyceal dilatation. 2. Prominent 5.0 x 6.4 x 4.9 cm hypoechoic mass is again in the region of the renal pelvis, reference is made to prior CT report of 09/17/2016 and 07/19/2016. Findings again consistent with uroepithelial tumor. Electronically Signed   By: Marcello Moores  Register   On: 10/30/2016 06:27   Dg Chest Port 1 View  Result  Date: 11/09/2016 CLINICAL DATA:  Leukocytosis EXAM: PORTABLE CHEST 1 VIEW COMPARISON:  10/17/2016 FINDINGS: Right shoulder replacement. Small bilateral effusions. Left greater than right lung base opacification. Cardiomegaly. No pneumothorax IMPRESSION: Small bilateral effusion with left greater than right bibasilar airspace disease which may be secondary to atelectasis or pneumonia Electronically Signed   By: Donavan Foil M.D.   On: 11/09/2016 20:33   Dg Abd 2 Views  Result Date: 10/11/2016 CLINICAL DATA:  Constipation EXAM: ABDOMEN - 2 VIEW COMPARISON:  09/17/2016 CT FINDINGS: A moderate amount of fecal retention is seen within large bowel consistent with constipation. A right-sided ureteral stent is noted, similar in appearance and orientation to prior CT scout view of the abdomen and pelvis. There is no evidence of free air. No radio-opaque calculi or other significant radiographic abnormality is seen. Cholecystectomy clips are seen in the right upper quadrant. There is thoracolumbar spondylosis with mild levoscoliosis. IMPRESSION: Findings consistent with constipation. Electronically Signed   By: Ashley Royalty M.D.   On: 10/11/2016 19:48   Dg C-arm 1-60 Min  Result Date: 11/30/2016 CLINICAL DATA:  Fixation of supracondylar femur fracture. EXAM: DG C-ARM 61-120 MIN; LEFT FEMUR 2 VIEWS COMPARISON:  Radiographs 10/30/2016 FINDINGS: 4 fluoroscopic spot images demonstrate a long intramedullary rod in the femur with 4 distal and 1 proximal interlocking screws. Good position and alignment of the supracondylar femur fracture just above the knee prosthesis. No complicating features. IMPRESSION: Internal fixation of supracondylar femur fracture. Electronically Signed   By: Marijo Sanes M.D.   On: 11/14/2016 12:02   Dg Femur Min 2 Views Left  Result Date: 11/27/2016 CLINICAL DATA:  Fixation of supracondylar femur fracture. EXAM: DG C-ARM 61-120 MIN; LEFT FEMUR 2 VIEWS COMPARISON:  Radiographs 10/28/2016 FINDINGS:  4 fluoroscopic spot images demonstrate a long intramedullary rod in the femur with 4 distal and 1 proximal interlocking screws. Good position and alignment of the supracondylar femur fracture just above the knee prosthesis. No complicating features. IMPRESSION: Internal fixation of supracondylar femur fracture. Electronically Signed   By: Marijo Sanes M.D.   On: 11/28/2016 12:02   Dg Femur Min 2 Views Left  Result Date: 10/08/2016 CLINICAL DATA:  Status post fall. Distal femur fracture seen on mobile x-ray. EXAM: LEFT FEMUR 2 VIEWS COMPARISON:  No recent studies in [facility] FINDINGS: The patient has sustained an acute angulated fracture through the metadiaphysis of the distal femur. The fracture line lies adjacent to the superior aspect of the anterior portion of the femoral component of the knee prosthesis. More proximally the shaft of the femur is intact. The femoral head, neck, and intertrochanteric region are normal where visualized. IMPRESSION: There is an acute  angulated femoral fracture just proximal to the femoral component of the left knee prosthesis. Electronically Signed   By: David  Martinique M.D.   On: 10/05/2016 14:00   Dg Femur Port Min 2 Views Left  Result Date: 11/17/2016 CLINICAL DATA:  LEFT supracondylar femur fracture go this is EXAM: LEFT FEMUR PORTABLE 2 VIEWS COMPARISON:  10/28/2016 FINDINGS: Intramedullary nail fixation of distal supracondylar femur fracture. There are 4 distal cortical locking screws. One proximal locking cortical screw. Improvement in alignment of the fracture. Total knee arthroplasty noted. IMPRESSION: Internal female fixation of distal LEFT supracondylar femur fracture without complication Electronically Signed   By: Suzy Bouchard M.D.   On: 11/25/2016 14:38    Labs:  CBC:  Recent Labs  11/07/16 0950 11/08/16 1046 11/09/16 0936 11/10/16 0211  WBC 16.3* 18.3* 21.8* 20.5*  HGB 8.6* 9.1* 9.5* 8.3*  HCT 28.0* 30.1* 32.2* 28.4*  PLT 90* 128* 178 143*     COAGS:  Recent Labs  10/11/2016 1403  INR 1.15    BMP:  Recent Labs  11/07/16 0950 11/08/16 1046 11/09/16 0530 11/09/16 0936 11/10/16 0211  NA 146* 147* 147*  --  148*  K 4.8 4.9 5.5* 5.0 5.1  CL 120* 120* 116*  --  115*  CO2 14* 17* 19*  --  20*  GLUCOSE 137* 111* 82  --  94  BUN 28* 34* 38*  --  42*  CALCIUM 7.9* 8.0* 8.2*  --  8.4*  CREATININE 1.25* 1.44* 1.42*  --  1.55*  GFRNONAA 40* 34* 35*  --  31*  GFRAA 47* 39* 40*  --  36*    LIVER FUNCTION TESTS:  Recent Labs  10/30/16 0253 11/01/16 0439 11/07/16 0950 11/08/16 1046  BILITOT 0.9 0.7 0.4 0.7  AST 44* 37 69* 62*  ALT 10* 10* 12* 11*  ALKPHOS 114 116 94 104  PROT 5.0* 5.1* 5.1* 5.1*  ALBUMIN 1.7* 1.6* 1.4* 1.6*    TUMOR MARKERS: No results for input(s): AFPTM, CEA, CA199, CHROMGRNA in the last 8760 hours.  Assessment and Plan: Acute on chronic renal failure secondary to progression of right renal cancer with new hydronephrosis despite stent. After discussion with Urology, plan for (R)PCN placement. Discussed with pt and she seems to understand. Risks and benefits discussed with the patient including, but not limited to infection, bleeding, significant bleeding causing loss or decrease in renal function or damage to adjacent structures.  All of the patient's questions were answered, patient is agreeable but will have to sign 'emergent' consent due to encephalopathy    Thank you for this interesting consult.  I greatly enjoyed meeting Isabel Collins and look forward to participating in their care.  A copy of this report was sent to the requesting provider on this date.  Electronically Signed: Ascencion Dike, PA-C 11/10/2016, 11:08 AM   I spent a total of 25 minutes in face to face in clinical consultation, greater than 50% of which was counseling/coordinating care for right nephrostomy tube placement

## 2016-11-10 NOTE — Progress Notes (Addendum)
PROGRESS NOTE    Isabel Collins  GYF:[id number] DOB: Sep 22, 1940 DOA: 10/17/2016 PCP: [name], MD    Brief Narrative:  Patient is 76 year old female history of COPD, diabetes, hypertension, hyperlipidemia recent diagnosis of right upper pole transitional cell carcinoma was seen by urology with recent uteroscopic/biopsy and stent placement was supposed to have a nephrectomy 3 days prior to admission presented to the ED from SNF after fall with left knee injury. Patient with traumatic closed displaced supracondylar fracture of the left femur. Patient seen by orthopedics and underwent IM nailing 11/22/2016. Hospitalization complicated by bacteremia. Patient with significant anasarca/volume overload, patient with no significant improvement with encephalopathy, poor urine output despite diuresis. Patient was subsequently transferred to the stepdown unit. Placed on IV Lasix. CT abdomen and pelvis which was obtained was abnormal concerning for massive hydronephrosis and worsening soft tissue density. Urology, ID and nephrology consulted.   Assessment & Plan:   Principal Problem:   Traumatic closed displaced supracondylar fracture of left femur ([place]) Active Problems:   Hydronephrosis of right kidney   Hypertension   Physical deconditioning   Hypothyroidism   Chronic obstructive pulmonary disease (HCC)   Type 2 diabetes mellitus with complication, without long-term current use of insulin (HCC)   Urothelial carcinoma of kidney, right (HCC)   Gastroesophageal reflux disease   History of recent fall   Right renal mass   Fracture of knee prosthesis (HCC)   AKI (acute kidney injury) ([place])   UTI due to extended-spectrum beta lactamase (ESBL) producing Escherichia coli   Acute renal failure (ARF) ([place])   Fall   Sepsis due to Escherichia coli (E. coli) (HCC)   Bacteremia due to Gram-negative bacteria   Sepsis secondary to UTI (HCC)   Transitional cell carcinoma (HCC)   Hypervolemia   HCAP  (healthcare-associated pneumonia)   Leukocytosis   Encephalopathy acute   Abnormal CT scan, pelvis: 11/09/2016  #1 closed displaced left supracondylar femur fracture secondary to mechanical fall. Patient status post IM nail [id number]/8466 with no complications. Patient with a worsening leukocytosis. Patient with encephalopathy. CT left femur negative for abscess formation. Per orthopedics.  #2 Escherichia coli bacteremia/Escherichia coli UTI Patient currently afebrile. Repeat blood cultures with no growth to date. Patient afebrile however with blood pressure that is borderline. Worsening leukocytosis. Continue IV meropenem. See #4. Due to worsening leukocytosis, abnormalities noted on CT scan with massive hydronephrosis and consent for rapid progression of metastatic disease we'll consult with ID for further evaluation and management as well as antibiotic duration and recommendations. Will also reconsult urology as abnormalities noted on CT abdomen and pelvis.   #3 constipation Felt likely secondary to pain regimen. CT abdomen and pelvis with large amount of stool in the rectum. Given a soapsuds enema.   #4 Sepsis presented on admission with multidrug resistant Escherichia coli/ multidrug resistant Escherichia coli UTI in the setting of right upper pole transitional cell carcinoma pending nephrectomy -  blood cultures consistent with Escherichia coli as well as urine cultures consistent with Escherichia coli. Escherichia coli multidrug resistant. Repeat blood cultures with no growth to date. Patient with a worsening leukocytosis. CT abdomen and pelvis with massive hydronephrosis on the right and large soft tissue mass lesions in the right renal fossa concerning for possible metastatic disease or hematoma. Prominent lymphadenopathy. Concern for rapid progression of metastatic disease. CT left femur negative for any abscess formation. Worsening leukocytosis could be secondary to abnormalities noted on CT  scan. Continue IV Merrem. Consult with ID for further  evaluation and management.   #5 acute encephalopathy Felt to be multifactorial secondary to acute sepsis and probable opioid pain medication. Patient somewhat still drowsy however easily arousable and answering questions appropriately. Ultram has been discontinued. CT head unremarkable. Ammonia level is within normal limits. Chest x-ray with small bilateral effusion left greater than right basilar airspace disease which may be atelectasis or pneumonia. Patient is not coughing. CT left femur negative for any acute abnormalities. CT abdomen and pelvis with a massive hydronephrosis of the right kidney, edema, large soft tissue mass lesions in the right renal fossa extending along the course of the ureter catheter. ABG unremarkable. Patient following commands. Continue empiric IV antibiotics. Urology has been consulted to evaluate massive hydronephrosis.   #6 massive right hydronephrosis/abnormal CT abdomen and pelvis Patient noted to have massive right hydronephrosis with abnormal CT abdomen and pelvis with worsening soft tissue mass noted in the right renal fossa. Patient status post stent placement. Concern for blockage of stent. Will reconsult with urology for further evaluation and management. Patient may need IR for percutaneous drainage. Follow.  #7 acute kidney injury on chronic kidney disease stage II Felt to be initially secondary to sepsis/acute infection. Renal ultrasound negative for any hydro-nephrosis. Foley catheter inserted. Urine output is poor. Patient volume overloaded on examination. Patient now with worsening renal function.  CT abdomen and pelvis done 11/09/2016 with a massive hydronephrosis of the right kidney, large soft tissue mass lesions demonstrated in the right renal fossa extending along the course of ureter catheter. Likely metastatic disease or hematoma. Massively dilated ureter with hematoma could also have this appearance.  Prominent lymphadenopathy. Moderate free fluid in the abdomen and pelvis. Diffuse soft tissue edema throughout subcutaneous fat. Moderate bilateral pleural effusions with bilateral atelectasis or consolidation. Increased Lasix to 60 mg IV every 12 hours and added dose of Zaroxolyn as well as a dose of IV albumin yesterday with a urine output of 950 mL. Patient still significantly volume overloaded. Spoke with urology, Dr. Diona Fanti who will reassess the patient and decide as to whether patient may need percutaneous drainage per IR. Nephrology also consulted..   #8 hyperlipidemia Continue statin.  #9 metabolic acidosis Improving with IV bicarbonate.  #10 hypertension Blood pressure borderline. Monitor closely with diuretics. Follow.  #11 borderline type 2 diabetes mellitus CBGs ranging from 91 -118. Continue Sliding scale insulin.  #12 right upper tract urothelial carcinoma Patient is being followed by urology who will decide when to perform patient's nephrectomy during this hospitalization. Per urology note from 10/30/2016 patient's surgery will be rescheduled in approximately 2 weeks from time of femur repair. Continue IV antibiotics until surgery for nephrectomy per urology recommendations.  #13 volume overload Patient significantly volume overloaded on examination. Patient with significant lower extremity edema. Patient is +12.8 L during this hospitalization. Increased Lasix to 60 mg IV every 12 hours one-time dose of Zaroxolyn and patient given a dose of abdomen 11/09/2016. Urine output of 950 mL however decreased urine output compared to diuretics given. Patient with borderline blood pressure. Will consult with nephrology for further evaluation and management of significant volume overload.   #14 Hypernatremia Place on free water boluses.    DVT prophylaxis: Lovenox Code Status: Full Family Communication: Updated patient. No family at bedside. Disposition Plan: Likely skilled nursing  facility when medically stable. Currently not ready for discharge.   Consultants:   Orthopedics: Dr. [name] 10/30/2016  Urology progress note Dr. [name] 10/30/2016  Nephrology pending  Infectious disease pending  Urology pending  Procedures:  CT chest 10/30/2016  Plain films of the pelvis 10/10/2016  Plain films of the left knee 10/28/2016  Renal ultrasound 10/12/2016  Intramedullary retrograde femoral nailing per Dr. [name] 11/25/2016  CT abdomen and pelvis 11/09/2016  CT head 11/09/2016  CT left femur 11/09/2016  Chest x-ray 11/09/2016  Albumin IV 1 11/09/2016  Antimicrobials:   IV Ancef 10/30/2016>>>>> 11/07/2016  IV Merrem 10/14/2016>>>>  IV Zosyn a 27 20181 dose.  IV vancomycin   Subjective: Events overnight noted patient transferred to stepdown unit. Patient is drowsy. Patient easily arousable. Patient states she does not feel too well. Patient with some complaints of shortness of breath. Patient complaining of abdominal swelling. Patient denies any abdominal pain. Urine output of 950 mL yesterday after a dose of Zaroxolyn, IV Lasix, albumin. Patient denies chest pain.   Objective: Vitals:   11/09/16 2354 11/10/16 0200 11/10/16 0425 11/10/16 0746  BP: (!) 78/58 112/69 (!) 113/50 108/80  Pulse:  98 99 94  Resp:  19 14 16   Temp:   98.5 F (36.9 C) 97.7 F (36.5 C)  TempSrc:   Axillary Axillary  SpO2:  97% 93% 97%  Weight:      Height:        Intake/Output Summary (Last 24 hours) at 11/10/16 0952 Last data filed at 11/10/16 0750  Gross per 24 hour  Intake          1268.33 ml  Output             1100 ml  Net           168.33 ml   Filed Weights   11/08/16 0500 11/09/16 0500 11/09/16 1224  Weight: 68 kg (150 lb) 86.2 kg (190 lb) 86.4 kg (190 lb 8 oz)    Examination:  General exam: Lethargy. Drowsy. Respiratory system: Decreased breath sounds in the bases. Scattered crackles. Some use of accessory muscles of  respiratory. Cardiovascular system: S1 & S2 heard, RRR. No JVD, murmurs, rubs, gallops or clicks. 3+ bilateral lower extremity edema. Gastrointestinal system: Abdomen is nondistended, soft and nontender. No organomegaly or masses felt. Normal bowel sounds heard. Central nervous system: Patient drowsy. No focal deficits.  Extremities: Knee immobilizer on left lower extremity. Symmetric 5 x 5 power. Skin: No rashes, lesions or ulcers Psychiatry: Judgement and insight appear fair. Mood & affect appropriate.     Data Reviewed: I have personally reviewed following labs and imaging studies  CBC:  Recent Labs Lab 11/22/2016 0821 11/07/16 0950 11/08/16 1046 11/09/16 0936 11/10/16 0211  WBC 17.4* 16.3* 18.3* 21.8* 20.5*  NEUTROABS  --   --  15.6*  --  15.9*  HGB 9.1* 8.6* 9.1* 9.5* 8.3*  HCT 31.1* 28.0* 30.1* 32.2* 28.4*  MCV 86.9 85.9 86.2 86.1 87.1  PLT 127* 90* 128* 178 242*   Basic Metabolic Panel:  Recent Labs Lab 11/16/2016 0821 11/07/16 0950 11/08/16 1046 11/09/16 0530 11/09/16 0936 11/10/16 0211  NA 146* 146* 147* 147*  --  148*  K 4.4 4.8 4.9 5.5* 5.0 5.1  CL 122* 120* 120* 116*  --  115*  CO2 17* 14* 17* 19*  --  20*  GLUCOSE 139* 137* 111* 82  --  94  BUN 25* 28* 34* 38*  --  42*  CREATININE 1.12* 1.25* 1.44* 1.42*  --  1.55*  CALCIUM 8.0* 7.9* 8.0* 8.2*  --  8.4*  MG  --   --  2.9*  --   --   --  GFR: Estimated Creatinine Clearance: 31 mL/min (A) (by C-G formula based on SCr of 1.55 mg/dL (H)). Liver Function Tests:  Recent Labs Lab 11/07/16 0950 11/08/16 1046  AST 69* 62*  ALT 12* 11*  ALKPHOS 94 104  BILITOT 0.4 0.7  PROT 5.1* 5.1*  ALBUMIN 1.4* 1.6*   No results for input(s): LIPASE, AMYLASE in the last 168 hours.  Recent Labs Lab 11/09/16 1903  AMMONIA 15   Coagulation Profile: No results for input(s): INR, PROTIME in the last 168 hours. Cardiac Enzymes: No results for input(s): CKTOTAL, CKMB, CKMBINDEX, TROPONINI in the last 168  hours. BNP (last 3 results) No results for input(s): PROBNP in the last 8760 hours. HbA1C: No results for input(s): HGBA1C in the last 72 hours. CBG:  Recent Labs Lab 11/09/16 0834 11/09/16 1220 11/09/16 1739 11/10/16 0200 11/10/16 0746  GLUCAP 73 110* 118* 91 94   Lipid Profile: No results for input(s): CHOL, HDL, LDLCALC, TRIG, CHOLHDL, LDLDIRECT in the last 72 hours. Thyroid Function Tests: No results for input(s): TSH, T4TOTAL, FREET4, T3FREE, THYROIDAB in the last 72 hours. Anemia Panel: No results for input(s): VITAMINB12, FOLATE, FERRITIN, TIBC, IRON, RETICCTPCT in the last 72 hours. Sepsis Labs:  Recent Labs Lab 11/09/16 1903  LATICACIDVEN 4.1*    Recent Results (from the past 240 hour(s))  Urine culture     Status: None   Collection Time: 11/07/16  8:10 AM  Result Value Ref Range Status   Specimen Description URINE, CLEAN CATCH  Final   Special Requests NONE  Final   Culture NO GROWTH  Final   Report Status 11/08/2016 FINAL  Final  MRSA PCR Screening     Status: None   Collection Time: 11/10/16  2:01 AM  Result Value Ref Range Status   MRSA by PCR NEGATIVE NEGATIVE Final    Comment:        The GeneXpert MRSA Assay (FDA approved for NASAL specimens only), is one component of a comprehensive MRSA colonization surveillance program. It is not intended to diagnose MRSA infection nor to guide or monitor treatment for MRSA infections.          Radiology Studies: Ct Head Wo Contrast  Result Date: 11/09/2016 CLINICAL DATA:  [facility] at skilled nursing facility. History of transitional cell carcinoma. EXAM: CT HEAD WITHOUT CONTRAST TECHNIQUE: Contiguous axial images were obtained from the base of the skull through the vertex without intravenous contrast. COMPARISON:  CT HEAD March 28, 2016 FINDINGS: BRAIN: No intraparenchymal hemorrhage, mass effect nor midline shift. The ventricles and sulci are normal for age. Patchy supratentorial white matter  hypodensities less than expected for patient's age, though non-specific are most compatible with chronic small vessel ischemic disease. No acute large vascular territory infarcts. No abnormal extra-axial fluid collections. Basal cisterns are patent. VASCULAR: Mild normal calcific atherosclerosis of the carotid siphons. SKULL: No skull fracture. No significant scalp soft tissue swelling. SINUSES/ORBITS: Mild RIGHT maxillary sinus mucosal thickening without air-fluid levels. Mastoid air cells are well aerated. The included ocular globes and orbital contents are non-suspicious. OTHER: None. IMPRESSION: 1. Negative noncontrast CT HEAD for age. Electronically Signed   By: Elon Alas M.D.   On: 11/09/2016 23:00   Ct Abdomen Pelvis W Contrast  Result Date: 11/09/2016 CLINICAL DATA:  Abdominal pain and fever. Recent diagnosis of right upper pole transitional cell carcinoma. Recent biopsy and stent placement. EXAM: CT ABDOMEN AND PELVIS WITH CONTRAST TECHNIQUE: Multidetector CT imaging of the abdomen and pelvis was performed using the standard protocol  following bolus administration of intravenous contrast. CONTRAST:  80 mL Isovue-300 COMPARISON:  09/17/2016 FINDINGS: Lower chest: Moderate bilateral pleural effusions with basilar atelectasis or consolidation. Calcified granuloma in the right lung base. Large esophageal hiatal hernia. Coronary artery and aortic valve calcifications. Hepatobiliary: No focal liver abnormality is seen. Status post cholecystectomy. No biliary dilatation. Pancreas: Unremarkable. No pancreatic ductal dilatation or surrounding inflammatory changes. Spleen: Normal in size without focal abnormality. Adrenals/Urinary Tract: No adrenal gland nodules. Since the previous study, there is interval development of a large fluid collection involving the right kidney consistent with massive hydronephrosis. This collection measures about 9.9 cm in diameter. There is a ureteral catheter present with the  proximal pigtail within this dilated structure. There is compression and thinning of the renal parenchyma. Renal parenchyma was normal in thickness on the prior study. There is interval development of a large amount of soft tissue density in the right renal fossa, extending to the paracaval retroperitoneum and peripancreatic regions as well a celiac axis. Soft tissue density extends along the right psoas muscle surrounding the ureteral stent down to the level of the pelvis. Soft tissue structure around the ureteral stent anterior to the psoas muscle measures about 4.9 x 7.8 cm in diameter. Enlarged lymph nodes are suggested in the retrocrural and retroperitoneal para-aortic regions. This is demonstrating significant progression since previous study. Differential diagnosis would include large hematomas versus rapid development and progression of lymph node metastasis. No contrast material is demonstrated in the right renal collecting system The left kidney and ureter are unremarkable. The bladder is decompressed. Distal catheter pigtail is present in the bladder. Bladder cannot be evaluated due to decompression. Small amount of gas suggested in the bladder is likely iatrogenic. Stomach/Bowel: Stomach, small bowel, and colon are not abnormally distended. Large amount of stool in the rectum. Scattered stool throughout the remainder the colon. Vascular/Lymphatic: Diffuse aortic and branch vessel calcifications. Extensive lymphadenopathy in the retroperitoneum, celiac axis, and pelvis. Reproductive: Status post hysterectomy. No adnexal masses. Other: There is moderate free fluid in the abdomen and pelvis. No free air is identified. Prominent diffuse edema throughout the subcutaneous fat of the abdomen and pelvis. Abdominal wall musculature appears intact. Musculoskeletal: Degenerative changes in the spine. No destructive bone lesions. IMPRESSION: 1. A right ureteral catheter has been placed. There is massive dilatation  of the right renal collecting system with thinning of the right renal cortex. Large soft tissue mass lesions are demonstrated in the right renal fossa and extending along the course of the ureteral catheter. This likely represents metastatic disease or hematoma. A massively dilated ureter with hematoma could also potentially have this appearance. Prominent lymphadenopathy is suggested in the right pararenal spaces, retroperitoneum, celiac axis, and pelvis. Changes demonstrate significant progression since previous study, possibly indicating rapid progression of metastatic disease. Also could consider lymphoma. 2. Moderate free fluid in the abdomen and pelvis. 3. Diffuse soft tissue edema throughout the subcutaneous fat. 4. Moderate bilateral pleural effusions with basilar atelectasis or consolidation. Electronically Signed   By: Lucienne Capers M.D.   On: 11/09/2016 23:43   Ct Femur Left W Contrast  Result Date: 11/10/2016 CLINICAL DATA:  Left femur fracture status post fall 3 days ago. EXAM: CT OF THE LOWER RIGHT EXTREMITY WITH CONTRAST TECHNIQUE: Multidetector CT imaging of the lower right extremity was performed according to the standard protocol following intravenous contrast administration. COMPARISON:  None. CONTRAST:  183mL ISOVUE-300 IOPAMIDOL (ISOVUE-300) INJECTION 61% FINDINGS: Bones/Joint/Cartilage No left hip fracture or dislocation. Left hip joint  space is relatively well maintained. Left total knee arthroplasty without hardware failure or complication. Beam hardening artifact resulting from the hardware partially obscuring the adjacent soft tissue osseous structures. Mildly comminuted, nondisplaced fracture of the distal femoral metaphysis transfixed with a intramedullary nail. Near anatomic alignment. No hardware failure or complication. Postsurgical changes in the surrounding soft tissues. No other fracture or dislocation. Small knee joint effusion. No aggressive lytic or sclerotic osseous  lesion. Normal alignment. No joint effusion. Ligaments Ligaments are suboptimally evaluated by CT. Muscles and Tendons Soft tissue edema in the subcutaneous fat of the left thigh primarily along the lateral aspect likely postsurgical. No fluid collection or hematoma. Quadriceps and patellar tendons are intact. Soft tissue No fluid collection or hematoma.  No soft tissue mass. IMPRESSION: 1. Interval ORIF of a distal left femoral metaphysis fracture in near anatomic alignment. Electronically Signed   By: Kathreen Devoid   On: 11/10/2016 08:12   Dg Chest Port 1 View  Result Date: 11/09/2016 CLINICAL DATA:  Leukocytosis EXAM: PORTABLE CHEST 1 VIEW COMPARISON:  10/09/2016 FINDINGS: Right shoulder replacement. Small bilateral effusions. Left greater than right lung base opacification. Cardiomegaly. No pneumothorax IMPRESSION: Small bilateral effusion with left greater than right bibasilar airspace disease which may be secondary to atelectasis or pneumonia Electronically Signed   By: Donavan Foil M.D.   On: 11/09/2016 20:33        Scheduled Meds: . atorvastatin  40 mg Oral QHS  . cycloSPORINE  1 drop Both Eyes BID  . DULoxetine  30 mg Oral q1800  . DULoxetine  60 mg Oral Daily  . feeding supplement (ENSURE ENLIVE)  237 mL Oral BID BM  . fluconazole  100 mg Oral Daily  . free water  250 mL Oral Q8H  . furosemide  60 mg Intravenous Q12H  . insulin aspart  0-9 Units Subcutaneous TID WC  . levothyroxine  75 mcg Oral QAC breakfast  . mometasone-formoterol  2 puff Inhalation BID  . pantoprazole  40 mg Oral Daily  . pregabalin  150 mg Oral BID   Continuous Infusions: . meropenem (MERREM) IV Stopped (11/10/16 0545)  .  sodium bicarbonate  infusion 1000 mL 50 mL/hr at 11/09/16 1839     LOS: 12 days    Time spent: 36 minutes    THOMPSON,DANIEL, MD [facility] Pager [phone]  If 7PM-7AM, please contact night-coverage [email or web address] Password [email or web address] 11/10/2016, 9:52 AM

## 2016-11-10 NOTE — Consult Note (Addendum)
Date of Admission:  11/01/2016      Reason for Consult: ESBL bacteremia due to complicated UTI with transitional cell carcinoma  Referring Provider: Dr. Grandville Silos   Assessment: 1. Failure to thrive 2. 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 Left femur fracture sp ORIF 4. Delirium 5. Complicated UTI with bacteremia due to ESBL   Plan: 1. Continue carbapenem for now 2. Re-consult Urology and they have consulted IR for possible PCT drainage of fluid which I would also send for culture 3. Try to find friend, family who might be able to consent for procedure if she cannot there is one contact listed amongst her friends who has a phone number and she said he could be contacted Isabel Collins [phone] 4. Not clear that pateint really needs the azole with <10k organisms seen in Urology office. I doubt this was signficant or playing role in her pathology 5. Palliative care consult 6. Consider diagnostic and therapeutic thoracocentesis but I think her primary pathology is in her abdomen where she has obviously progressive disease  Principal Problem:   Traumatic closed displaced supracondylar fracture of left femur ([place]) Active Problems:   Hypertension   Physical deconditioning   Hypothyroidism   Chronic obstructive pulmonary disease (HCC)   Type 2 diabetes mellitus with complication, without long-term current use of insulin (HCC)   Urothelial carcinoma of kidney, right (HCC)   Gastroesophageal reflux disease   History of recent fall   Right renal mass   Fracture of knee prosthesis (HCC)   AKI (acute kidney injury) ([place])   UTI due to extended-spectrum beta lactamase (ESBL) producing Escherichia coli   Acute renal failure (ARF) ([place])   Fall   Sepsis due to Escherichia coli (E. coli) ([place])   Bacteremia due to Gram-negative bacteria   Sepsis secondary to UTI ([place])   Transitional cell carcinoma (HCC)   Hypervolemia   HCAP  (healthcare-associated pneumonia)   Leukocytosis   Encephalopathy acute   Hydronephrosis of right kidney   Abnormal CT scan, pelvis: 11/09/2016   . atorvastatin  40 mg Oral QHS  . cycloSPORINE  1 drop Both Eyes BID  . DULoxetine  30 mg Oral q1800  . DULoxetine  60 mg Oral Daily  . feeding supplement (ENSURE ENLIVE)  237 mL Oral BID BM  . fluconazole  100 mg Oral Daily  . free water  250 mL Oral Q8H  . furosemide  60 mg Intravenous Q12H  . insulin aspart  0-9 Units Subcutaneous TID WC  . iopamidol      . levothyroxine  75 mcg Oral QAC breakfast  . lidocaine (PF)      . mometasone-formoterol  2 puff Inhalation BID  . pantoprazole  40 mg Oral Daily  . pregabalin  150 mg Oral BID    HPI: Isabel Collins is a 76 y.o. female female with medical history significant of chronic anemia, anxiety, chronic constipation, COPD, osteoarthritis of spine, colon diverticulosis, fibromyalgia, GERD, history of gross hematuria, had a hernia,, colon polyps, hyperlipidemia, hypertension, hypothyroidism IBS, right renal mass who underwent right uterus copy and biopsy of right renal pelvic tumor with placement of right ureteral stent on 08/27/2016 and is coming with a history of progressively worse right flank pain with intermittent nausea and vomiting since then. Her biopsy showed high-grade urothelial carcinoma. She was supposed to follow-up, but missed appointment with Dr. [name] to discuss right nephroureterectomy. She was hospitalized and diagnosed with an ESBL  UTI  (though colony count was only 40k) on 10/11/16 and treated with IV meropenem via PICC line. Meropenem was finished and she was then placed on nitrofurantoin. She presented to the ER with knee pain. Apparently she had been helped by employee at her skilled nursing facility when she fell. In the emerge apart x-ray showed a displaced left supracondylar condyle femoral fracture.\  She was hypotensive and found to have ESBL notches in the urine but in  the blood as well. Note she still had her PICC line from before as well. She was seen by orthopedic surgery and ultimately underwent IM nail placement on 11/10/2016.  Plans have been made for nephrectomy but this was not yet able to be done due in part to the patient's delirium and difficulty achieving informed consent for the procedure. In the interval she has cleared her bacteremia and I believe her central line has been removed. He remains on IV meropenem. She has not been febrile but has been delirious.  Repeat CT of the abdomen and pelvis shows:  1. A right ureteral catheter has been placed. There is massive dilatation of the right renal collecting system with thinning of the right renal cortex. Large soft tissue mass lesions are demonstrated in the right renal fossa and extending along the course of the ureteral catheter. This likely represents metastatic disease or hematoma. A massively dilated ureter with hematoma could also potentially have this appearance. Prominent lymphadenopathy is suggested in the right pararenal spaces, retroperitoneum, celiac axis, and pelvis. Changes demonstrate significant progression since previous study, possibly indicating rapid progression of metastatic disease. Also could consider lymphoma. 2. Moderate free fluid in the abdomen and pelvis. 3. Diffuse soft tissue edema throughout the subcutaneous fat. 4. Moderate bilateral pleural effusions with basilar atelectasis or Consolidation.  CT femur without new pathology or suggestion of infectious disease.   The patient has also been on fluconazole to the fact that some yeast and diphtheroids grew on cultures from urology's office. They wish for her to be suppressed prior to surgery   Review of Systems: Review of Systems  Unable to perform ROS: Medical condition    Past Medical History:  Diagnosis Date  . Anemia   . Anxiety   . Chronic constipation   . COPD (chronic obstructive pulmonary disease)  ([place])   . Degenerative arthritis of spine   . Diverticulosis of colon   . Fibromyalgia   . GERD (gastroesophageal reflux disease)   . Gross hematuria   . Hiatal hernia   . History of chronic bronchitis   . History of colon polyps   . Hyperlipidemia   . Hypertension   . Hypothyroidism   . IBS (irritable bowel syndrome)   . OA (osteoarthritis)    hips  . Renal mass, right     Social History  Substance Use Topics  . Smoking status: Former Smoker    Packs/day: 0.50    Years: 46.00    Types: Cigarettes    Quit date: 03/05/2004  . Smokeless tobacco: Never Used  . Alcohol use No    Family History  Problem Relation Age of Onset  . Diabetes Mother   . Heart disease Mother   . Diabetes Father   . Heart disease Father   . Diabetes Sister   . Diabetes Brother        x3  . Colon cancer Neg Hx   . Esophageal cancer Neg Hx   . Stomach cancer Neg Hx   . Pancreatic cancer  Neg Hx    No Known Allergies  OBJECTIVE: Blood pressure 108/80, pulse 94, temperature 97.7 F (36.5 C), temperature source Axillary, resp. rate 16, height 5\' 2"  (1.575 m), weight 190 lb 8 oz (86.4 kg), SpO2 97 %.  Physical Exam  HENT:  Head: Normocephalic.  Mouth/Throat: Oropharynx is clear and moist. No oropharyngeal exudate.  Eyes: Conjunctivae are normal. Right eye exhibits no discharge.  Neck: Normal range of motion. No JVD present. No tracheal deviation present.  Cardiovascular: Normal rate, regular rhythm and normal heart sounds.  Exam reveals no gallop and no friction rub.   No murmur heard. Pulmonary/Chest: Effort normal and breath sounds normal. No respiratory distress. She has no wheezes. She has no rales.  Abdominal: Soft. She exhibits distension. There is no tenderness. There is no rebound and no guarding.  Musculoskeletal:  Left side in brace  Neurological: She is alert.  Confused and difficult to understand speech, no focal deficits  Skin: She is not diaphoretic.    Lab Results Lab  Results  Component Value Date   WBC 20.5 (H) 11/10/2016   HGB 8.3 (L) 11/10/2016   HCT 28.4 (L) 11/10/2016   MCV 87.1 11/10/2016   PLT 143 (L) 11/10/2016    Lab Results  Component Value Date   CREATININE 1.55 (H) 11/10/2016   BUN 42 (H) 11/10/2016   NA 148 (H) 11/10/2016   K 5.1 11/10/2016   CL 115 (H) 11/10/2016   CO2 20 (L) 11/10/2016    Lab Results  Component Value Date   ALT 11 (L) 11/08/2016   AST 62 (H) 11/08/2016   ALKPHOS 104 11/08/2016   BILITOT 0.7 11/08/2016     Microbiology: Recent Results (from the past 240 hour(s))  Urine culture     Status: None   Collection Time: 11/07/16  8:10 AM  Result Value Ref Range Status   Specimen Description URINE, CLEAN CATCH  Final   Special Requests NONE  Final   Culture NO GROWTH  Final   Report Status 11/08/2016 FINAL  Final  Culture, blood (Routine X 2) w Reflex to ID Panel     Status: None (Preliminary result)   Collection Time: 11/09/16  7:02 PM  Result Value Ref Range Status   Specimen Description BLOOD RIGHT HAND  Final   Special Requests IN PEDIATRIC BOTTLE Blood Culture adequate volume  Final   Culture NO GROWTH < 12 HOURS  Final   Report Status PENDING  Incomplete  Culture, blood (Routine X 2) w Reflex to ID Panel     Status: None (Preliminary result)   Collection Time: 11/09/16  7:20 PM  Result Value Ref Range Status   Specimen Description BLOOD LEFT HAND  Final   Special Requests   Final    BOTTLES DRAWN AEROBIC ONLY Blood Culture adequate volume   Culture NO GROWTH < 12 HOURS  Final   Report Status PENDING  Incomplete  MRSA PCR Screening     Status: None   Collection Time: 11/10/16  2:01 AM  Result Value Ref Range Status   MRSA by PCR NEGATIVE NEGATIVE Final    Comment:        The GeneXpert MRSA Assay (FDA approved for NASAL specimens only), is one component of a comprehensive MRSA colonization surveillance program. It is not intended to diagnose MRSA infection nor to guide or monitor treatment  for MRSA infections.     Alcide Evener, [facility] [phone]  pager    11/10/2016, 12:35 PM

## 2016-11-10 NOTE — Procedures (Signed)
Interventional Radiology Procedure Note  Procedure: Placement of a 62F tube into the heterogenous, mass-like right kidney.   Unclear if tube is in the collecting system or parenchyma - no normal collecting system could be identified.  Tube is placed near the double J stent and draining thick, purulent/necrotic sludge.    Complications: None immediate.  Estimated Blood Loss: <25 mL  Recommendations: - Cultures sent - Watch drain output - do NOT flush tube  Signed,  Criselda Peaches, MD

## 2016-11-10 NOTE — Consult Note (Signed)
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 I was consulted tonight to come and assess the patient due to marginal BP. Patient has been diuresed over the last couple of days. She complains of pain at the site of the nephrostomy and complains of being thirsty. She is being followed by ID and nephrology.    PAST MEDICAL HISTORY :  She  has a past medical history of Anemia; Anxiety; Chronic constipation; COPD (chronic obstructive pulmonary disease) ([place]); Degenerative arthritis of spine; Diverticulosis of colon; Fibromyalgia; GERD (gastroesophageal reflux disease); Gross hematuria; Hiatal hernia; History of chronic bronchitis; History of colon polyps; Hyperlipidemia; Hypertension; Hypothyroidism; IBS (irritable bowel syndrome); OA (osteoarthritis); and Renal mass, right.  PAST SURGICAL HISTORY: She  has a past surgical history that includes Replacement total knee (Left); Total shoulder replacement (Right); arm surgery (Left); Breast reduction surgery (Bilateral); Cholecystectomy; Tubal ligation; Cystoscopy with retrograde pyelogram, ureteroscopy and stent placement (Right, 08/27/2016); Cystoscopy with biopsy (Right, 08/27/2016); Femur IM nail (Left, 11/29/2016); and IR NEPHROSTOMY PLACEMENT RIGHT (11/10/2016).  No Known Allergies  No current facility-administered medications on file prior to encounter.    Current Outpatient Prescriptions on File Prior to Encounter  Medication Sig  .  ADVAIR DISKUS 250-50 MCG/DOSE AEPB Inhale 1 puff into the lungs 2 (two) times daily as needed (shortness of breath).   Marland Kitchen albuterol (PROVENTIL HFA;VENTOLIN HFA) 108 (90 Base) MCG/ACT inhaler Inhale 1-2 puffs into the lungs every 6 (six) hours as needed for wheezing.  Marland Kitchen atorvastatin (LIPITOR) 40 MG tablet Take 1 tablet (40 mg total) by mouth every evening. (Patient taking differently: Take 40 mg by mouth at bedtime. )  . bisacodyl (DULCOLAX) 5 MG EC tablet Take 5 mg by mouth at bedtime as needed for moderate constipation.   . Cholecalciferol (VITAMIN D3) 400 units CAPS Take 400 Units by mouth daily.  . cycloSPORINE (RESTASIS) 0.05 % ophthalmic emulsion Place 1 drop into both eyes 2 (two) times daily.  . DULoxetine (CYMBALTA) 30 MG capsule Take 1 capsule (30 mg total) by mouth 2 (two) times daily. Take two capsules po in the morning and one capsule po in the afternoon.  . fluticasone (FLONASE) 50 MCG/ACT nasal spray Place 2 sprays into both nostrils at bedtime as needed for allergies or rhinitis.  Marland Kitchen HYDROcodone-acetaminophen (NORCO/VICODIN) 5-325 MG tablet Take 1 tablet by mouth every 6 (six) hours as needed for moderate pain.  Marland Kitchen levothyroxine (SYNTHROID, LEVOTHROID) 75 MCG tablet Take 1 tablet (75 mcg total) by mouth daily before breakfast.  . metoprolol succinate (TOPROL-XL) 100 MG 24 hr tablet TAKE 1 TABLET BY MOUTH  DAILY WITH OR IMMEDIATLEY  FOLLOWING A MEAL  . NEXIUM 40 MG capsule Take 1 capsule (40 mg total) by mouth daily.  . polyethylene glycol (MIRALAX / GLYCOLAX) packet Take 17 g by mouth daily as needed for mild constipation.  . pregabalin (LYRICA) 150 MG capsule Take 1 capsule (150 mg total) by mouth 2 (two) times daily.  . nitrofurantoin, macrocrystal-monohydrate, (MACROBID) 100 MG capsule  Take 1 capsule (100 mg total) by mouth 2 (two) times daily. For 2 weeks, until Kidney surgery (Patient not taking: Reported on 10/26/2016)  . senna-docusate (SENOKOT-S) 8.6-50 MG tablet Take 1 tablet by  mouth 2 (two) times daily. (Patient not taking: Reported on 10/13/2016)    FAMILY HISTORY:  Her indicated that the status of her mother is unknown. She indicated that the status of her father is unknown. She indicated that the status of her sister is unknown. She indicated that the status of her brother is unknown. She indicated that the status of her neg hx is unknown.    SOCIAL HISTORY: She  reports that she quit smoking about 12 years ago. Her smoking use included Cigarettes. She has a 23.00 pack-year smoking history. She has never used smokeless tobacco. She reports that she does not drink alcohol or use drugs.  REVIEW OF SYSTEMS:   All 11 point system review was unremarkable other than what is mentioned in HPI,.     VITAL SIGNS: BP (!) 107/59   Pulse 99   Temp 97.7 F (36.5 C) (Axillary)   Resp 19   Ht 5\' 2"  (1.575 m)   Wt 89.4 kg (197 lb)   SpO2 97%   BMI 36.03 kg/m   HEMODYNAMICS:  stable off pressors  VENTILATOR SETTINGS:  on 1 litre [place]  INTAKE / OUTPUT: I/O last 3 completed shifts: In: 2218.3 [P.O.:100; I.V.:1718.3; IV Piggyback:400] Out: 1100 [Urine:1100]  PHYSICAL EXAMINATION: General: looks acutely ill, lethargic but easily arousabel answers questions Neuro:  Moving all extremities following commands  HEENT:  Dry mucus membranes  Cardiovascular:  Normal heart sounds no murmurs Lungs:  Clear equal air sounds bilaterally no wheezing  Abdomen:  Soft no tenderness  Musculoskeletal:  ++ edema Skin:  No rash   LABS:  BMET  Recent Labs Lab 11/08/16 1046 11/09/16 0530 11/09/16 0936 11/10/16 0211  NA 147* 147*  --  148*  K 4.9 5.5* 5.0 5.1  CL 120* 116*  --  115*  CO2 17* 19*  --  20*  BUN 34* 38*  --  42*  CREATININE 1.44* 1.42*  --  1.55*  GLUCOSE 111* 82  --  94    Electrolytes  Recent Labs Lab 11/08/16 1046 11/09/16 0530 11/10/16 0211  CALCIUM 8.0* 8.2* 8.4*  MG 2.9*  --   --     CBC  Recent Labs Lab 11/08/16 1046 11/09/16 0936  11/10/16 0211  WBC 18.3* 21.8* 20.5*  HGB 9.1* 9.5* 8.3*  HCT 30.1* 32.2* 28.4*  PLT 128* 178 143*    Coag's No results for input(s): APTT, INR in the last 168 hours.  Sepsis Markers  Recent Labs Lab 11/09/16 1903 11/10/16 1921  LATICACIDVEN 4.1* 3.1*    ABG  Recent Labs Lab 11/09/16 2105  PHART 7.456*  PCO2ART 33.7  PO2ART 119*    Liver Enzymes  Recent Labs Lab 11/07/16 0950 11/08/16 1046  AST 69* 62*  ALT 12* 11*  ALKPHOS 94 104  BILITOT 0.4 0.7  ALBUMIN 1.4* 1.6*    Cardiac Enzymes No results for input(s): TROPONINI, PROBNP in the last 168 hours.  Glucose  Recent Labs Lab 11/09/16 1739 11/10/16 0200 11/10/16 0746 11/10/16 1455 11/10/16 1648 11/10/16 1944  GLUCAP 118* 91 94 78 68 89    Imaging Ct Head Wo Contrast  Result Date: 11/09/2016 CLINICAL DATA:  [facility] at skilled nursing facility. History of transitional cell carcinoma. EXAM: CT HEAD WITHOUT CONTRAST TECHNIQUE: Contiguous axial images  were obtained from the base of the skull through the vertex without intravenous contrast. COMPARISON:  CT HEAD March 28, 2016 FINDINGS: BRAIN: No intraparenchymal hemorrhage, mass effect nor midline shift. The ventricles and sulci are normal for age. Patchy supratentorial white matter hypodensities less than expected for patient's age, though non-specific are most compatible with chronic small vessel ischemic disease. No acute large vascular territory infarcts. No abnormal extra-axial fluid collections. Basal cisterns are patent. VASCULAR: Mild normal calcific atherosclerosis of the carotid siphons. SKULL: No skull fracture. No significant scalp soft tissue swelling. SINUSES/ORBITS: Mild RIGHT maxillary sinus mucosal thickening without air-fluid levels. Mastoid air cells are well aerated. The included ocular globes and orbital contents are non-suspicious. OTHER: None. IMPRESSION: 1. Negative noncontrast CT HEAD for age. Electronically Signed   By: Elon Alas  M.D.   On: 11/09/2016 23:00   Ct Abdomen Pelvis W Contrast  Result Date: 11/09/2016 CLINICAL DATA:  Abdominal pain and fever. Recent diagnosis of right upper pole transitional cell carcinoma. Recent biopsy and stent placement. EXAM: CT ABDOMEN AND PELVIS WITH CONTRAST TECHNIQUE: Multidetector CT imaging of the abdomen and pelvis was performed using the standard protocol following bolus administration of intravenous contrast. CONTRAST:  80 mL Isovue-300 COMPARISON:  09/17/2016 FINDINGS: Lower chest: Moderate bilateral pleural effusions with basilar atelectasis or consolidation. Calcified granuloma in the right lung base. Large esophageal hiatal hernia. Coronary artery and aortic valve calcifications. Hepatobiliary: No focal liver abnormality is seen. Status post cholecystectomy. No biliary dilatation. Pancreas: Unremarkable. No pancreatic ductal dilatation or surrounding inflammatory changes. Spleen: Normal in size without focal abnormality. Adrenals/Urinary Tract: No adrenal gland nodules. Since the previous study, there is interval development of a large fluid collection involving the right kidney consistent with massive hydronephrosis. This collection measures about 9.9 cm in diameter. There is a ureteral catheter present with the proximal pigtail within this dilated structure. There is compression and thinning of the renal parenchyma. Renal parenchyma was normal in thickness on the prior study. There is interval development of a large amount of soft tissue density in the right renal fossa, extending to the paracaval retroperitoneum and peripancreatic regions as well a celiac axis. Soft tissue density extends along the right psoas muscle surrounding the ureteral stent down to the level of the pelvis. Soft tissue structure around the ureteral stent anterior to the psoas muscle measures about 4.9 x 7.8 cm in diameter. Enlarged lymph nodes are suggested in the retrocrural and retroperitoneal para-aortic regions.  This is demonstrating significant progression since previous study. Differential diagnosis would include large hematomas versus rapid development and progression of lymph node metastasis. No contrast material is demonstrated in the right renal collecting system The left kidney and ureter are unremarkable. The bladder is decompressed. Distal catheter pigtail is present in the bladder. Bladder cannot be evaluated due to decompression. Small amount of gas suggested in the bladder is likely iatrogenic. Stomach/Bowel: Stomach, small bowel, and colon are not abnormally distended. Large amount of stool in the rectum. Scattered stool throughout the remainder the colon. Vascular/Lymphatic: Diffuse aortic and branch vessel calcifications. Extensive lymphadenopathy in the retroperitoneum, celiac axis, and pelvis. Reproductive: Status post hysterectomy. No adnexal masses. Other: There is moderate free fluid in the abdomen and pelvis. No free air is identified. Prominent diffuse edema throughout the subcutaneous fat of the abdomen and pelvis. Abdominal wall musculature appears intact. Musculoskeletal: Degenerative changes in the spine. No destructive bone lesions. IMPRESSION: 1. A right ureteral catheter has been placed. There is massive dilatation of the right  renal collecting system with thinning of the right renal cortex. Large soft tissue mass lesions are demonstrated in the right renal fossa and extending along the course of the ureteral catheter. This likely represents metastatic disease or hematoma. A massively dilated ureter with hematoma could also potentially have this appearance. Prominent lymphadenopathy is suggested in the right pararenal spaces, retroperitoneum, celiac axis, and pelvis. Changes demonstrate significant progression since previous study, possibly indicating rapid progression of metastatic disease. Also could consider lymphoma. 2. Moderate free fluid in the abdomen and pelvis. 3. Diffuse soft tissue  edema throughout the subcutaneous fat. 4. Moderate bilateral pleural effusions with basilar atelectasis or consolidation. Electronically Signed   By: Lucienne Capers M.D.   On: 11/09/2016 23:43   Ct Femur Left W Contrast  Result Date: 11/10/2016 CLINICAL DATA:  Left femur fracture status post fall 3 days ago. EXAM: CT OF THE LOWER RIGHT EXTREMITY WITH CONTRAST TECHNIQUE: Multidetector CT imaging of the lower right extremity was performed according to the standard protocol following intravenous contrast administration. COMPARISON:  None. CONTRAST:  124mL ISOVUE-300 IOPAMIDOL (ISOVUE-300) INJECTION 61% FINDINGS: Bones/Joint/Cartilage No left hip fracture or dislocation. Left hip joint space is relatively well maintained. Left total knee arthroplasty without hardware failure or complication. Beam hardening artifact resulting from the hardware partially obscuring the adjacent soft tissue osseous structures. Mildly comminuted, nondisplaced fracture of the distal femoral metaphysis transfixed with a intramedullary nail. Near anatomic alignment. No hardware failure or complication. Postsurgical changes in the surrounding soft tissues. No other fracture or dislocation. Small knee joint effusion. No aggressive lytic or sclerotic osseous lesion. Normal alignment. No joint effusion. Ligaments Ligaments are suboptimally evaluated by CT. Muscles and Tendons Soft tissue edema in the subcutaneous fat of the left thigh primarily along the lateral aspect likely postsurgical. No fluid collection or hematoma. Quadriceps and patellar tendons are intact. Soft tissue No fluid collection or hematoma.  No soft tissue mass. IMPRESSION: 1. Interval ORIF of a distal left femoral metaphysis fracture in near anatomic alignment. Electronically Signed   By: Kathreen Devoid   On: 11/10/2016 08:12   Dg Chest Port 1 View  Result Date: 11/09/2016 CLINICAL DATA:  Leukocytosis EXAM: PORTABLE CHEST 1 VIEW COMPARISON:  11/01/2016 FINDINGS: Right  shoulder replacement. Small bilateral effusions. Left greater than right lung base opacification. Cardiomegaly. No pneumothorax IMPRESSION: Small bilateral effusion with left greater than right bibasilar airspace disease which may be secondary to atelectasis or pneumonia Electronically Signed   By: Donavan Foil M.D.   On: 11/09/2016 20:33   Ir Nephrostomy Placement Right  Result Date: 11/10/2016 INDICATION: 76 year old female with advanced right-sided urothelial cancer and what appears to be severe right-sided hydronephrosis with unobstructed double-J ureteral stent. Additionally, she has urosepsis. She presents for percutaneous nephrostomy tube placement. EXAM: IR NEPHROSTOMY PLACEMENT RIGHT COMPARISON:  CT scan abdomen/pelvis 11/09/2016 MEDICATIONS: Patient is currently an inpatient in receiving intravenous antibiotics. No additional antibiotic prophylaxis was administered. ANESTHESIA/SEDATION: 1 mg Versed CONTRAST:  15 mL Isovue-300 - administered into the collecting system(s) FLUOROSCOPY TIME:  Fluoroscopy Time: 3 minutes 30 seconds (49.5 mGy). COMPLICATIONS: None immediate. PROCEDURE: Informed written consent was obtained from the patient after a thorough discussion of the procedural risks, benefits and alternatives. All questions were addressed. Maximal Sterile Barrier Technique was utilized including caps, mask, sterile gowns, sterile gloves, sterile drape, hand hygiene and skin antiseptic. A timeout was performed prior to the initiation of the procedure. The right flank was interrogated with ultrasound. The kidney is extremely abnormal in sonographic appearance.  The kidney is diffusely heterogeneous with areas of hypoechoic, echogenic and intermediate echogenicity throughout. No definite hydronephrosis or dilated calices are identified. Local anesthesia was attained by infiltration with 1% lidocaine. Under real-time sonographic guidance, the kidney was punctured using a 21 gauge Accustick needle. A  relatively hypoechoic portion of the kidney was targeted. The needle tip was easily visualized within the hypoechoic segment. Aspiration yields no material. A gentle injection of contrast through the needle demonstrates irregular nonspecific patchy staining. It is unclear if the needle tip is within a renal calyx filled with thick material/debris or if it is within the renal parenchyma itself. The CT scan was brought up and evaluated real-time while ultrasound Ing the kidney. There is no definitively normal kidney or collecting system visible. Under fluoroscopy, the Accustick needle was advanced into the same location as the double-J ureteral stent which should be in the renal pelvis. Again, urine could not be aspirated. Injection of contrast material demonstrates irregular pooling of contrast. Again, it is unclear if this is within the renal collecting system or diseased parenchyma. The 0.018 inch wire was carefully advanced through the needle. This appears formed within a space. The needle was exchanged over the wire for the Accustick dilator. Aspiration yields a small amount of thick, purulent fluid/debris. A 0.035 Amplatz wire was then advanced. The Accustick sheath was removed. The skin tract was dilated to 10 [place] and a Cook 10.2 [place] drainage catheter was advanced over the wire. The drainage catheter is only partially formed. Aspiration again yields PICC bloody purulent/ debris filled material. No definite urine is aspirated. The catheter was connected to gravity bag drainage and secured to the skin with 0 Prolene suture. IMPRESSION: 1. Successful placement of a 10 French percutaneous nephrostomy tube into the complex left renal mass in the region of the double-J ureteral stent. Of note, it is unclear if this drainage catheter is within the renal collecting system, or within heavily diseased renal parenchyma. No normal collecting system or urine could be identified. The catheter is draining thick bloody  purulent/necrotic material. PLAN: 1. Cultures were sent. 2. Monitor drain output. 3. Do not flush drain. 4. Consider hospice consultation. Electronically Signed   By: Jacqulynn Cadet M.D.   On: 11/10/2016 14:40       CULTURES: Ecoli ESBL from blood and urine   ANTIBIOTICS: Meropenem   SIGNIFICANT EVENTS: Nephrostomy tube Rt side 11/10/2016 Left femur fracture nailing 9/4   ASSESSMENT / PLAN: Patient with right hydronephrosis secondary to high grade urethral carcinoma Acute kidney injury Severe sepsis secondary to ESBL Ecoli Ecoli bacteremia Hypernatremia Metabolic encephalopathy Fractured  Left femur s/p fixation Severe malnutrition and hypoalbuminemia   Plan: - Increase IVF to 133ml/hr patient is very intravascularly depleted and she has a lot of extravascular fluid due to hypoalbuminemia  - avoid anymore diuretics. - IVF boluses as needed. Patient BP is stable for now.  - agree with ID current Abx - follow urine poutput - lactic acid is already improving and BP was 107/57 when I assessed her.  - we appreciate the consult, please let us know if condition deteriorates. patient can still be managed at the step down.   FAMILY  - Updates: no family around  - Inter-disciplinary family meet or Palliative Care meeting due by:  9/15   Pulmonary and [place] Pager: [phone]  11/10/2016, 8:26 PM

## 2016-11-10 NOTE — Plan of Care (Signed)
Problem: Health Behavior/Discharge Planning: Goal: Ability to manage health-related needs will improve Outcome: Not Progressing Patient only able to follow simple commands, slurred speech complete care at this time.   Problem: Physical Regulation: Goal: Ability to maintain clinical measurements within normal limits will improve Outcome: Not Progressing Hypotension and decreased urine output.   Problem: Activity: Goal: Risk for activity intolerance will decrease Outcome: Not Progressing Intolerant of movememnt  Problem: Nutrition: Goal: Adequate nutrition will be maintained Outcome: Not Progressing Dysphagia diet, but patient very lethargic  Problem: Education: Goal: Knowledge of the prescribed therapeutic regimen will improve Outcome: Not Progressing Patient not processing anything but simple instructions and commands. Cannot verbalize for teach back  Problem: Urinary Elimination: Goal: Signs and symptoms of infection will decrease Outcome: Not Progressing WBC elevating  Comments: Patient moved to stepdown unit due to hypotension, CHF, and not progressing clinically.

## 2016-11-10 NOTE — Consult Note (Addendum)
HPI: Isabel Collins is a 76 y.o. 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 There was  placement of  right ureteral stent on 08/27/2016 and was scheduled for nephrectomy on 8/30 but had a fall with left femur fracture requiring nailing on 9/4.  On 11/25/2016 BUN was 25 and cr 1.66m/dl.  Of note, on 8/27 urine and blood cultures grew E coli ESBL.  She recently had abd pain and fever and CT scan revealed marked hydro and a large renal and ureteral mass. She underwent IR placement of a 33F tube into the right collecting sytem draining thick purulent drainage. Labs today on 9/8 reveal a BUN of 42 and creat 1.584mdl and Sodium is 148103ml.  Albumin was 1.6 on 9/6. UOP has been declining and BP is soft.  Past Medical History:  Diagnosis Date  . Anemia   . Anxiety   . Chronic constipation   . COPD [place] disease) [place] Degenerative arthritis of spine   . Diverticulosis of colon   . Fibromyalgia   . GERD (gastroesophageal reflux disease)   . Gross hematuria   . Hiatal hernia   . History of chronic bronchitis   . History of colon polyps   . Hyperlipidemia   . Hypertension   . Hypothyroidism   . IBS (irritable bowel syndrome)   . OA (osteoarthritis)    hips  . Renal mass, right    Past Surgical History:  Procedure Laterality Date  . arm surgery Left   . BREAST REDUCTION SURGERY Bilateral   . CHOLECYSTECTOMY    . CYSTOSCOPY WITH BIOPSY Right 08/27/2016   Procedure: CYSTOSCOPY WITH BIOPSY;  Surgeon: OttKathie RhodesD;  [facility];  Laterality: Right;  . CYSTOSCOPY WITH RETROGRADE PYELOGRAM, URETEROSCOPY AND STENT PLACEMENT Right 08/27/2016   Procedure: CYSTOSCOPY WITH RIGHT RETROGRADE PYELOGRAM, RIGHT URETEROSCOPY AND STENT PLACEMENT;  Surgeon: OttKathie RhodesD;   [facility];  Laterality: Right;  . FEMUR IM NAIL Left 11/10/2016   Procedure: INTRAMEDULLARY (IM) RETROGRADE FEMORAL NAILING;  Surgeon: MurRenette ButtersD;  [facility];  Laterality: Left;  . IR NEPHROSTOMY PLACEMENT RIGHT  11/10/2016  . REPLACEMENT TOTAL KNEE Left   . TOTAL SHOULDER REPLACEMENT Right   . TUBAL LIGATION     Social History:  reports that she quit smoking about 12 years ago. Her smoking use included Cigarettes. She has a 23.00 pack-year smoking history. She has never used smokeless tobacco. She reports that she does not drink alcohol or use drugs. Allergies: No Known Allergies Family History  Problem Relation Age of Onset  . Diabetes Mother   . Heart disease Mother   . Diabetes Father   . Heart disease Father   . Diabetes Sister   . Diabetes Brother        x3  . Colon cancer Neg Hx   . Esophageal cancer Neg Hx   . Stomach cancer Neg Hx   . Pancreatic cancer Neg Hx     Medications:  Scheduled: . atorvastatin  40 mg Oral QHS  . cycloSPORINE  1 drop Both Eyes BID  . DULoxetine  30 mg Oral q1800  . DULoxetine  60 mg Oral Daily  . feeding supplement (ENSURE ENLIVE)  237 mL Oral BID BM  .  fluconazole  100 mg Oral Daily  . free water  250 mL Oral Q8H  . gelatin adsorbable      . insulin aspart  0-9 Units Subcutaneous TID WC  . levothyroxine  75 mcg Oral QAC breakfast  . lidocaine (PF)      . midazolam      . mometasone-formoterol  2 puff Inhalation BID  . pantoprazole  40 mg Oral Daily  . pregabalin  150 mg Oral BID   Continuous: . dextrose    . meropenem (MERREM) IV Stopped (11/10/16 0545)  .  sodium bicarbonate  infusion 1000 mL 50 mL/hr at 11/09/16 1839   ROS: She is post procedure and sedated Blood pressure (!) 107/59, pulse 99, temperature 97.7 F (36.5 C), temperature source Axillary, resp. rate 19, height _0  (1.575 m), weight 89.4 kg (197 lb), SpO2 97 %.  General appearance:  not arousable Head: Normocephalic, without obvious abnormality, atraumatic Resp: diminished breath sounds bibasilar Chest wall: no tenderness Cardio: RRR Abd tube in r flank GI: soft, non-tender; bowel sounds normal; no masses,  no organomegaly Extremities: splint on LLE Skin: Skin color, texture, turgor normal. No rashes or lesions Neurologic: sleepy Results for orders placed or performed during the hospital encounter of 10/14/2016 (from the past 48 hour(s))  Glucose, capillary     Status: None   Collection Time: 11/08/16 10:51 PM  Result Value Ref Range   Glucose-Capillary 71 65 - 99 mg/dL  Basic metabolic panel     Status: Abnormal   Collection Time: 11/09/16  5:30 AM  Result Value Ref Range   Sodium 147 (H) 135 - 145 mmol/L   Potassium 5.5 (H) 3.5 - 5.1 mmol/L    Comment: SLIGHT HEMOLYSIS   Chloride 116 (H) 101 - 111 mmol/L   CO2 19 (L) 22 - 32 mmol/L   Glucose, Bld 82 65 - 99 mg/dL   BUN 38 (H) 6 - 20 mg/dL   Creatinine, Ser 1.42 (H) 0.44 - 1.00 mg/dL   Calcium 8.2 (L) 8.9 - 10.3 mg/dL   GFR calc non Af Amer 35 (L) >60 mL/min   GFR calc Af Amer 40 (L) >60 mL/min    Comment: (NOTE) The eGFR has been calculated using the CKD EPI equation. This calculation has not been validated in all clinical situations. eGFR's persistently <60 mL/min signify possible Chronic Kidney Disease.    Anion gap 12 5 - 15  Glucose, capillary     Status: None   Collection Time: 11/09/16  8:34 AM  Result Value Ref Range   Glucose-Capillary 73 65 - 99 mg/dL  CBC     Status: Abnormal   Collection Time: 11/09/16  9:36 AM  Result Value Ref Range   WBC 21.8 (H) 4.0 - 10.5 K/uL   RBC 3.74 (L) 3.87 - 5.11 MIL/uL   Hemoglobin 9.5 (L) 12.0 - 15.0 g/dL   HCT 32.2 (L) 36.0 - 46.0 %   MCV 86.1 78.0 - 100.0 fL   MCH 25.4 (L) 26.0 - 34.0 pg   MCHC 29.5 (L) 30.0 - 36.0 g/dL   RDW 18.2 (H) 11.5 - 15.5 %   Platelets 178 150 - 400 K/uL  Potassium     Status: None   Collection Time: 11/09/16  9:36 AM   Result Value Ref Range   Potassium 5.0 3.5 - 5.1 mmol/L  Glucose, capillary     Status: Abnormal   Collection Time: 11/09/16 12:20 PM  Result Value Ref Range   Glucose-Capillary  110 (H) 65 - 99 mg/dL  Glucose, capillary     Status: Abnormal   Collection Time: 11/09/16  5:39 PM  Result Value Ref Range   Glucose-Capillary 118 (H) 65 - 99 mg/dL  Culture, blood (Routine X 2) w Reflex to ID Panel     Status: None (Preliminary result)   Collection Time: 11/09/16  7:02 PM  Result Value Ref Range   Specimen Description BLOOD RIGHT HAND    Special Requests IN PEDIATRIC BOTTLE Blood Culture adequate volume    Culture NO GROWTH < 12 HOURS    Report Status PENDING   Ammonia     Status: None   Collection Time: 11/09/16  7:03 PM  Result Value Ref Range   Ammonia 15 9 - 35 umol/L  Lactic acid, plasma     Status: Abnormal   Collection Time: 11/09/16  7:03 PM  Result Value Ref Range   Lactic Acid, Venous 4.1 (HH) 0.5 - 1.9 mmol/L    Comment: CRITICAL RESULT CALLED TO, READ BACK BY AND VERIFIED WITH: A PEATE,RN 2003 11/09/2016 WBOND   Culture, blood (Routine X 2) w Reflex to ID Panel     Status: None (Preliminary result)   Collection Time: 11/09/16  7:20 PM  Result Value Ref Range   Specimen Description BLOOD LEFT HAND    Special Requests      BOTTLES DRAWN AEROBIC ONLY Blood Culture adequate volume   Culture NO GROWTH < 12 HOURS    Report Status PENDING   Blood gas, arterial     Status: Abnormal   Collection Time: 11/09/16  9:05 PM  Result Value Ref Range   O2 Content 2.0 L/min   Delivery systems NASAL CANNULA    pH, Arterial 7.456 (H) 7.350 - 7.450   pCO2 arterial 33.7 32.0 - 48.0 mmHg   pO2, Arterial 119 (H) 83.0 - 108.0 mmHg   Bicarbonate 23.4 20.0 - 28.0 mmol/L   Acid-base deficit 0.1 0.0 - 2.0 mmol/L   O2 Saturation 98.6 %   Patient temperature 98.6    Collection site RIGHT RADIAL    Drawn by [id number]    Sample type ARTERIAL DRAW    Allens test (pass/fail) PASS PASS  Glucose,  capillary     Status: None   Collection Time: 11/10/16  2:00 AM  Result Value Ref Range   Glucose-Capillary 91 65 - 99 mg/dL  MRSA PCR Screening     Status: None   Collection Time: 11/10/16  2:01 AM  Result Value Ref Range   MRSA by PCR NEGATIVE NEGATIVE    Comment:        The GeneXpert MRSA Assay (FDA approved for NASAL specimens only), is one component of a comprehensive MRSA colonization surveillance program. It is not intended to diagnose MRSA infection nor to guide or monitor treatment for MRSA infections.   Basic metabolic panel     Status: Abnormal   Collection Time: 11/10/16  2:11 AM  Result Value Ref Range   Sodium 148 (H) 135 - 145 mmol/L   Potassium 5.1 3.5 - 5.1 mmol/L   Chloride 115 (H) 101 - 111 mmol/L   CO2 20 (L) 22 - 32 mmol/L   Glucose, Bld 94 65 - 99 mg/dL   BUN 42 (H) 6 - 20 mg/dL   Creatinine, Ser 1.55 (H) 0.44 - 1.00 mg/dL   Calcium 8.4 (L) 8.9 - 10.3 mg/dL   GFR calc non Af Amer 31 (L) >60 mL/min   GFR calc Af  Amer 36 (L) >60 mL/min    Comment: (NOTE) The eGFR has been calculated using the CKD EPI equation. This calculation has not been validated in all clinical situations. eGFR's persistently <60 mL/min signify possible Chronic Kidney Disease.    Anion gap 13 5 - 15  CBC with Differential/Platelet     Status: Abnormal   Collection Time: 11/10/16  2:11 AM  Result Value Ref Range   WBC 20.5 (H) 4.0 - 10.5 K/uL   RBC 3.26 (L) 3.87 - 5.11 MIL/uL   Hemoglobin 8.3 (L) 12.0 - 15.0 g/dL   HCT 28.4 (L) 36.0 - 46.0 %   MCV 87.1 78.0 - 100.0 fL   MCH 25.5 (L) 26.0 - 34.0 pg   MCHC 29.2 (L) 30.0 - 36.0 g/dL   RDW 18.0 (H) 11.5 - 15.5 %   Platelets 143 (L) 150 - 400 K/uL   Neutrophils Relative % 78 %   Neutro Abs 15.9 (H) 1.7 - 7.7 K/uL   Lymphocytes Relative 11 %   Lymphs Abs 2.3 0.7 - 4.0 K/uL   Monocytes Relative 10 %   Monocytes Absolute 2.1 (H) 0.1 - 1.0 K/uL   Eosinophils Relative 1 %   Eosinophils Absolute 0.2 0.0 - 0.7 K/uL   Basophils  Relative 0 %   Basophils Absolute 0.0 0.0 - 0.1 K/uL  Glucose, capillary     Status: None   Collection Time: 11/10/16  7:46 AM  Result Value Ref Range   Glucose-Capillary 94 65 - 99 mg/dL  Urinalysis, Routine w reflex microscopic     Status: Abnormal   Collection Time: 11/10/16  9:41 AM  Result Value Ref Range   Color, Urine YELLOW YELLOW   APPearance CLOUDY (A) CLEAR   Specific Gravity, Urine 1.008 1.005 - 1.030   pH 5.0 5.0 - 8.0   Glucose, UA NEGATIVE NEGATIVE mg/dL   Hgb urine dipstick LARGE (A) NEGATIVE   Bilirubin Urine NEGATIVE NEGATIVE   Ketones, ur NEGATIVE NEGATIVE mg/dL   Protein, ur 30 (A) NEGATIVE mg/dL   Nitrite NEGATIVE NEGATIVE   Leukocytes, UA LARGE (A) NEGATIVE   RBC / HPF 6-30 0 - 5 RBC/hpf   WBC, UA TOO NUMEROUS TO COUNT 0 - 5 WBC/hpf   Bacteria, UA RARE (A) NONE SEEN   Squamous Epithelial / LPF NONE SEEN NONE SEEN   Hyaline Casts, UA PRESENT   Body fluid culture     Status: None (Preliminary result)   Collection Time: 11/10/16  1:39 PM  Result Value Ref Range   Specimen Description FLUID KIDNEY RIGHT    Special Requests NONE    Gram Stain      ABUNDANT WBC PRESENT, PREDOMINANTLY MONONUCLEAR MODERATE GRAM NEGATIVE RODS    Culture PENDING    Report Status PENDING   Glucose, capillary     Status: None   Collection Time: 11/10/16  2:55 PM  Result Value Ref Range   Glucose-Capillary 78 65 - 99 mg/dL  Glucose, capillary     Status: None   Collection Time: 11/10/16  4:48 PM  Result Value Ref Range   Glucose-Capillary 68 65 - 99 mg/dL   Comment 1 Notify RN    Comment 2 Document in Chart    Ct Head Wo Contrast  Result Date: 11/09/2016 CLINICAL DATA:  [facility] at skilled nursing facility. History of transitional cell carcinoma. EXAM: CT HEAD WITHOUT CONTRAST TECHNIQUE: Contiguous axial images were obtained from the base of the skull through the vertex without intravenous contrast. COMPARISON:  CT HEAD March 28, 2016 FINDINGS: BRAIN: No intraparenchymal  hemorrhage, mass effect nor midline shift. The ventricles and sulci are normal for age. Patchy supratentorial white matter hypodensities less than expected for patient's age, though non-specific are most compatible with chronic small vessel ischemic disease. No acute large vascular territory infarcts. No abnormal extra-axial fluid collections. Basal cisterns are patent. VASCULAR: Mild normal calcific atherosclerosis of the carotid siphons. SKULL: No skull fracture. No significant scalp soft tissue swelling. SINUSES/ORBITS: Mild RIGHT maxillary sinus mucosal thickening without air-fluid levels. Mastoid air cells are well aerated. The included ocular globes and orbital contents are non-suspicious. OTHER: None. IMPRESSION: 1. Negative noncontrast CT HEAD for age. Electronically Signed   By: Elon Alas M.D.   On: 11/09/2016 23:00   Ct Abdomen Pelvis W Contrast  Result Date: 11/09/2016 CLINICAL DATA:  Abdominal pain and fever. Recent diagnosis of right upper pole transitional cell carcinoma. Recent biopsy and stent placement. EXAM: CT ABDOMEN AND PELVIS WITH CONTRAST TECHNIQUE: Multidetector CT imaging of the abdomen and pelvis was performed using the standard protocol following bolus administration of intravenous contrast. CONTRAST:  80 mL Isovue-300 COMPARISON:  09/17/2016 FINDINGS: Lower chest: Moderate bilateral pleural effusions with basilar atelectasis or consolidation. Calcified granuloma in the right lung base. Large esophageal hiatal hernia. Coronary artery and aortic valve calcifications. Hepatobiliary: No focal liver abnormality is seen. Status post cholecystectomy. No biliary dilatation. Pancreas: Unremarkable. No pancreatic ductal dilatation or surrounding inflammatory changes. Spleen: Normal in size without focal abnormality. Adrenals/Urinary Tract: No adrenal gland nodules. Since the previous study, there is interval development of a large fluid collection involving the right kidney consistent  with massive hydronephrosis. This collection measures about 9.9 cm in diameter. There is a ureteral catheter present with the proximal pigtail within this dilated structure. There is compression and thinning of the renal parenchyma. Renal parenchyma was normal in thickness on the prior study. There is interval development of a large amount of soft tissue density in the right renal fossa, extending to the paracaval retroperitoneum and peripancreatic regions as well a celiac axis. Soft tissue density extends along the right psoas muscle surrounding the ureteral stent down to the level of the pelvis. Soft tissue structure around the ureteral stent anterior to the psoas muscle measures about 4.9 x 7.8 cm in diameter. Enlarged lymph nodes are suggested in the retrocrural and retroperitoneal para-aortic regions. This is demonstrating significant progression since previous study. Differential diagnosis would include large hematomas versus rapid development and progression of lymph node metastasis. No contrast material is demonstrated in the right renal collecting system The left kidney and ureter are unremarkable. The bladder is decompressed. Distal catheter pigtail is present in the bladder. Bladder cannot be evaluated due to decompression. Small amount of gas suggested in the bladder is likely iatrogenic. Stomach/Bowel: Stomach, small bowel, and colon are not abnormally distended. Large amount of stool in the rectum. Scattered stool throughout the remainder the colon. Vascular/Lymphatic: Diffuse aortic and branch vessel calcifications. Extensive lymphadenopathy in the retroperitoneum, celiac axis, and pelvis. Reproductive: Status post hysterectomy. No adnexal masses. Other: There is moderate free fluid in the abdomen and pelvis. No free air is identified. Prominent diffuse edema throughout the subcutaneous fat of the abdomen and pelvis. Abdominal wall musculature appears intact. Musculoskeletal: Degenerative changes in  the spine. No destructive bone lesions. IMPRESSION: 1. A right ureteral catheter has been placed. There is massive dilatation of the right renal collecting system with thinning of the right renal cortex. Large soft tissue mass lesions  are demonstrated in the right renal fossa and extending along the course of the ureteral catheter. This likely represents metastatic disease or hematoma. A massively dilated ureter with hematoma could also potentially have this appearance. Prominent lymphadenopathy is suggested in the right pararenal spaces, retroperitoneum, celiac axis, and pelvis. Changes demonstrate significant progression since previous study, possibly indicating rapid progression of metastatic disease. Also could consider lymphoma. 2. Moderate free fluid in the abdomen and pelvis. 3. Diffuse soft tissue edema throughout the subcutaneous fat. 4. Moderate bilateral pleural effusions with basilar atelectasis or consolidation. Electronically Signed   By: Lucienne Capers M.D.   On: 11/09/2016 23:43   Ct Femur Left W Contrast  Result Date: 11/10/2016 CLINICAL DATA:  Left femur fracture status post fall 3 days ago. EXAM: CT OF THE LOWER RIGHT EXTREMITY WITH CONTRAST TECHNIQUE: Multidetector CT imaging of the lower right extremity was performed according to the standard protocol following intravenous contrast administration. COMPARISON:  None. CONTRAST:  185m ISOVUE-300 IOPAMIDOL (ISOVUE-300) INJECTION 61% FINDINGS: Bones/Joint/Cartilage No left hip fracture or dislocation. Left hip joint space is relatively well maintained. Left total knee arthroplasty without hardware failure or complication. Beam hardening artifact resulting from the hardware partially obscuring the adjacent soft tissue osseous structures. Mildly comminuted, nondisplaced fracture of the distal femoral metaphysis transfixed with a intramedullary nail. Near anatomic alignment. No hardware failure or complication. Postsurgical changes in the  surrounding soft tissues. No other fracture or dislocation. Small knee joint effusion. No aggressive lytic or sclerotic osseous lesion. Normal alignment. No joint effusion. Ligaments Ligaments are suboptimally evaluated by CT. Muscles and Tendons Soft tissue edema in the subcutaneous fat of the left thigh primarily along the lateral aspect likely postsurgical. No fluid collection or hematoma. Quadriceps and patellar tendons are intact. Soft tissue No fluid collection or hematoma.  No soft tissue mass. IMPRESSION: 1. Interval ORIF of a distal left femoral metaphysis fracture in near anatomic alignment. Electronically Signed   By: HKathreen Devoid  On: 11/10/2016 08:12   Dg Chest Port 1 View  Result Date: 11/09/2016 CLINICAL DATA:  Leukocytosis EXAM: PORTABLE CHEST 1 VIEW COMPARISON:  10/16/2016 FINDINGS: Right shoulder replacement. Small bilateral effusions. Left greater than right lung base opacification. Cardiomegaly. No pneumothorax IMPRESSION: Small bilateral effusion with left greater than right bibasilar airspace disease which may be secondary to atelectasis or pneumonia Electronically Signed   By: KDonavan FoilM.D.   On: 11/09/2016 20:33   Ir Nephrostomy Placement Right  Result Date: 11/10/2016 INDICATION: 76year old female with advanced right-sided urothelial cancer and what appears to be severe right-sided hydronephrosis with unobstructed double-J ureteral stent. Additionally, she has urosepsis. She presents for percutaneous nephrostomy tube placement. EXAM: IR NEPHROSTOMY PLACEMENT RIGHT COMPARISON:  CT scan abdomen/pelvis 11/09/2016 MEDICATIONS: Patient is currently an inpatient in receiving intravenous antibiotics. No additional antibiotic prophylaxis was administered. ANESTHESIA/SEDATION: 1 mg Versed CONTRAST:  15 mL Isovue-300 - administered into the collecting system(s) FLUOROSCOPY TIME:  Fluoroscopy Time: 3 minutes 30 seconds (49.5 mGy). COMPLICATIONS: None immediate. PROCEDURE: Informed written  consent was obtained from the patient after a thorough discussion of the procedural risks, benefits and alternatives. All questions were addressed. Maximal Sterile Barrier Technique was utilized including caps, mask, sterile gowns, sterile gloves, sterile drape, hand hygiene and skin antiseptic. A timeout was performed prior to the initiation of the procedure. The right flank was interrogated with ultrasound. The kidney is extremely abnormal in sonographic appearance. The kidney is diffusely heterogeneous with areas of hypoechoic, echogenic and intermediate echogenicity throughout. No  definite hydronephrosis or dilated calices are identified. Local anesthesia was attained by infiltration with 1% lidocaine. Under real-time sonographic guidance, the kidney was punctured using a 21 gauge Accustick needle. A relatively hypoechoic portion of the kidney was targeted. The needle tip was easily visualized within the hypoechoic segment. Aspiration yields no material. A gentle injection of contrast through the needle demonstrates irregular nonspecific patchy staining. It is unclear if the needle tip is within a renal calyx filled with thick material/debris or if it is within the renal parenchyma itself. The CT scan was brought up and evaluated real-time while ultrasound Ing the kidney. There is no definitively normal kidney or collecting system visible. Under fluoroscopy, the Accustick needle was advanced into the same location as the double-J ureteral stent which should be in the renal pelvis. Again, urine could not be aspirated. Injection of contrast material demonstrates irregular pooling of contrast. Again, it is unclear if this is within the renal collecting system or diseased parenchyma. The 0.018 inch wire was carefully advanced through the needle. This appears formed within a space. The needle was exchanged over the wire for the Accustick dilator. Aspiration yields a small amount of thick, purulent fluid/debris. A  0.035 Amplatz wire was then advanced. The Accustick sheath was removed. The skin tract was dilated to 10 [place] and a Cook 10.2 [place] drainage catheter was advanced over the wire. The drainage catheter is only partially formed. Aspiration again yields PICC bloody purulent/ debris filled material. No definite urine is aspirated. The catheter was connected to gravity bag drainage and secured to the skin with 0 Prolene suture. IMPRESSION: 1. Successful placement of a 10 French percutaneous nephrostomy tube into the complex left renal mass in the region of the double-J ureteral stent. Of note, it is unclear if this drainage catheter is within the renal collecting system, or within heavily diseased renal parenchyma. No normal collecting system or urine could be identified. The catheter is draining thick bloody purulent/necrotic material. PLAN: 1. Cultures were sent. 2. Monitor drain output. 3. Do not flush drain. 4. Consider hospice consultation. Electronically Signed   By: Jacqulynn Cadet M.D.   On: 11/10/2016 14:40    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 3  E coli bacteremia and UTI 4  Hypernatremia 5  Severe hypoalbuminemia 6  Shock due to sepsis 7  AKI, hemodynamic and obstructive  Plan: 1 Hydrate with hypotonic solution 2 Rec CCM evaluation 3 Stop pregabalin  Jamieson Hetland C 11/10/2016, 5:49 PM

## 2016-11-10 NOTE — Progress Notes (Signed)
CRITICAL VALUE ALERT  Critical Value:  Lactic acid 3.1  Date & Time Notied:  09/08 2215  Provider Notified:    Jeannette Corpus  Orders Received/Actions taken: no orders received

## 2016-11-10 NOTE — Significant Event (Signed)
Rapid Response Event Note RN called for abnormal labs and a second set of eyes Overview: Time Called: 2049 Arrival Time: 2055 Event Type: Other (Comment) (abnormal labs)  Initial Focused Assessment: Pt was in CT having several scans when RN first called me for this pt @ 2049. After reviewing pt's chart I advised RN to page on call in regards to CXR suggesting PNA and Lactic Acid 4.1, and unable to give fluids due to concern for "overload."  SBP 80-90's after returning from CT.  On my arrival at 2338 pt alert and oriented, follows commands, answers questions appropriately, SBP 80-90's followed by two manual BP 78/56.   Interventions: Pt transferred to 2c12   Event Summary: Name of Physician Notified: Blount NP  at 2355    at    Outcome: Transferred (Comment) (2c12)     Gevena Mart, Sela Hua

## 2016-11-10 NOTE — Progress Notes (Signed)
Patient arrived on unit, obtunded but following simple commands. Left upper thigh sutures draining large amounts of serous fluid. Left leg in splint, left foot toes, dusky in appearance, toenails blackish. Nailbeds on fingers dusky, saturation probe difficult to obtain sats at times, on ear currently. Albumin completed, urine 350 this shift. BP systolic has been over 030. At 0650 more awake asking for water and complaining of back pain , repositioned for comfort.

## 2016-11-10 NOTE — Consult Note (Signed)
Urology Consult   Physician requesting consult: Irine Seal, M.D.  Reason for consult: transitional cell carcinoma of right kidney with significant change in CT appearance of tumor  History of Present Illness: Isabel Collins is a 76 y.o. 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 This was diagnosed earlier this summer, and she underwent cystoscopy, right ureteroscopy, biopsy of right renal pelvic tumor and stent placement by Dr. Karsten Ro. The patient had been scheduled for nephrectomy, which has been postponed.  This current admission is for management of a left femur fracture.  Because of worsening of her condition and leukocytosis, as well as abdominal pain, she underwent repeat CT scan of the abdomen recently.  This revealed striking changes of her right kidney since her prior CT scan in July, 2018.  >  There appears to be, at the least,significant right hydronephrosis versus hematoma, significant parenchymal thinning and probable tumor progression along the course of her right ureter/right retroperitoneum.  Because of this,further urologic consultation is requested.  The patient does not have family to discuss this situation with.  Past Medical History:  Diagnosis Date  . Anemia   . Anxiety   . Chronic constipation   . COPD (chronic obstructive pulmonary disease) ([place])   . Degenerative arthritis of spine   . Diverticulosis of colon   . Fibromyalgia   . GERD (gastroesophageal reflux disease)   . Gross hematuria   . Hiatal hernia   . History of chronic bronchitis   . History of colon polyps   . Hyperlipidemia   . Hypertension   . Hypothyroidism   . IBS (irritable bowel syndrome)   . OA (osteoarthritis)    hips  . Renal mass, right     Past Surgical History:  Procedure Laterality Date  . arm surgery Left   . BREAST REDUCTION SURGERY Bilateral   . CHOLECYSTECTOMY    . CYSTOSCOPY WITH BIOPSY Right 08/27/2016   Procedure:  CYSTOSCOPY WITH BIOPSY;  Surgeon: Kathie Rhodes, MD;  Location: [facility];  Service: Urology;  Laterality: Right;  . CYSTOSCOPY WITH RETROGRADE PYELOGRAM, URETEROSCOPY AND STENT PLACEMENT Right 08/27/2016   Procedure: CYSTOSCOPY WITH RIGHT RETROGRADE PYELOGRAM, RIGHT URETEROSCOPY AND STENT PLACEMENT;  Surgeon: Kathie Rhodes, MD;  Location: [facility];  Service: Urology;  Laterality: Right;  . FEMUR IM NAIL Left 11/28/2016   Procedure: INTRAMEDULLARY (IM) RETROGRADE FEMORAL NAILING;  Surgeon: Renette Butters, MD;  Location: [place];  Service: Orthopedics;  Laterality: Left;  . REPLACEMENT TOTAL KNEE Left   . TOTAL SHOULDER REPLACEMENT Right   . TUBAL LIGATION       Current Hospital Medications: Scheduled Meds: . atorvastatin  40 mg Oral QHS  . cycloSPORINE  1 drop Both Eyes BID  . DULoxetine  30 mg Oral q1800  . DULoxetine  60 mg Oral Daily  . feeding supplement (ENSURE ENLIVE)  237 mL Oral BID BM  . fluconazole  100 mg Oral Daily  . free water  250 mL Oral Q8H  . furosemide  60 mg Intravenous Q12H  . insulin aspart  0-9 Units Subcutaneous TID WC  . levothyroxine  75 mcg Oral QAC breakfast  . mometasone-formoterol  2 puff Inhalation BID  . pantoprazole  40 mg Oral Daily  . pregabalin  150 mg Oral BID   Continuous Infusions: . meropenem (MERREM) IV Stopped (11/10/16 0545)  .  sodium bicarbonate  infusion 1000 mL 50 mL/hr at 11/09/16 1839  PRN Meds:.acetaminophen, albuterol, bisacodyl, fluticasone, senna-docusate  Allergies: No Known Allergies  Family History  Problem Relation Age of Onset  . Diabetes Mother   . Heart disease Mother   . Diabetes Father   . Heart disease Father   . Diabetes Sister   . Diabetes Brother        x3  . Colon cancer Neg Hx   . Esophageal cancer Neg Hx   . Stomach cancer Neg Hx   . Pancreatic cancer Neg Hx     Social History:  reports that she quit smoking about 12 years ago. Her smoking use included Cigarettes.  She has a 23.00 pack-year smoking history. She has never used smokeless tobacco. She reports that she does not drink alcohol or use drugs.  ROS: Generalized swelling,abdominal, back pain, shortness of breath.  Physical Exam:  Vital signs in last 24 hours: Temp:  [97.5 F (36.4 C)-98.5 F (36.9 C)] 97.7 F (36.5 C) (09/08 0746) Pulse Rate:  [89-112] 94 (09/08 0746) Resp:  [14-24] 16 (09/08 0746) BP: (78-113)/(40-80) 108/80 (09/08 0746) SpO2:  [91 %-97 %] 97 % (09/08 0746) Weight:  [86.4 kg (190 lb 8 oz)] 86.4 kg (190 lb 8 oz) (09/07 1224) General:  She is in moderate distress in the slightly tachypneic HEENT: Normocephalic, atraumatic.  Temporal wasting noted Neck: supple Cardiovascular: regular rate Lungs: tachypnea, decreased inspiratory effort Abdomen: somewhat distended.  Right sided abdominal mass noted.  Right sided abdominal tenderness and CVA tenderness. Extremities: edematous Neurologic: Grossly intact  Laboratory Data:   Recent Labs  11/08/16 1046 11/09/16 0936 11/10/16 0211  WBC 18.3* 21.8* 20.5*  HGB 9.1* 9.5* 8.3*  HCT 30.1* 32.2* 28.4*  PLT 128* 178 143*     Recent Labs  11/08/16 1046 11/09/16 0530 11/09/16 0936 11/10/16 0211  NA 147* 147*  --  148*  K 4.9 5.5* 5.0 5.1  CL 120* 116*  --  115*  GLUCOSE 111* 82  --  94  BUN 34* 38*  --  42*  CALCIUM 8.0* 8.2*  --  8.4*  CREATININE 1.44* 1.42*  --  1.55*     Results for orders placed or performed during the hospital encounter of 10/26/2016 (from the past 24 hour(s))  Glucose, capillary     Status: Abnormal   Collection Time: 11/09/16 12:20 PM  Result Value Ref Range   Glucose-Capillary 110 (H) 65 - 99 mg/dL  Glucose, capillary     Status: Abnormal   Collection Time: 11/09/16  5:39 PM  Result Value Ref Range   Glucose-Capillary 118 (H) 65 - 99 mg/dL  Ammonia     Status: None   Collection Time: 11/09/16  7:03 PM  Result Value Ref Range   Ammonia 15 9 - 35 umol/L  Lactic acid, plasma      Status: Abnormal   Collection Time: 11/09/16  7:03 PM  Result Value Ref Range   Lactic Acid, Venous 4.1 (HH) 0.5 - 1.9 mmol/L  Blood gas, arterial     Status: Abnormal   Collection Time: 11/09/16  9:05 PM  Result Value Ref Range   O2 Content 2.0 L/min   Delivery systems NASAL CANNULA    pH, Arterial 7.456 (H) 7.350 - 7.450   pCO2 arterial 33.7 32.0 - 48.0 mmHg   pO2, Arterial 119 (H) 83.0 - 108.0 mmHg   Bicarbonate 23.4 20.0 - 28.0 mmol/L   Acid-base deficit 0.1 0.0 - 2.0 mmol/L   O2 Saturation 98.6 %   Patient temperature 98.6  Collection site RIGHT RADIAL    Drawn by [phone]    Sample type ARTERIAL DRAW    Allens test (pass/fail) PASS PASS  Glucose, capillary     Status: None   Collection Time: 11/10/16  2:00 AM  Result Value Ref Range   Glucose-Capillary 91 65 - 99 mg/dL  MRSA PCR Screening     Status: None   Collection Time: 11/10/16  2:01 AM  Result Value Ref Range   MRSA by PCR NEGATIVE NEGATIVE  Basic metabolic panel     Status: Abnormal   Collection Time: 11/10/16  2:11 AM  Result Value Ref Range   Sodium 148 (H) 135 - 145 mmol/L   Potassium 5.1 3.5 - 5.1 mmol/L   Chloride 115 (H) 101 - 111 mmol/L   CO2 20 (L) 22 - 32 mmol/L   Glucose, Bld 94 65 - 99 mg/dL   BUN 42 (H) 6 - 20 mg/dL   Creatinine, Ser 1.55 (H) 0.44 - 1.00 mg/dL   Calcium 8.4 (L) 8.9 - 10.3 mg/dL   GFR calc non Af Amer 31 (L) >60 mL/min   GFR calc Af Amer 36 (L) >60 mL/min   Anion gap 13 5 - 15  CBC with Differential/Platelet     Status: Abnormal   Collection Time: 11/10/16  2:11 AM  Result Value Ref Range   WBC 20.5 (H) 4.0 - 10.5 K/uL   RBC 3.26 (L) 3.87 - 5.11 MIL/uL   Hemoglobin 8.3 (L) 12.0 - 15.0 g/dL   HCT 28.4 (L) 36.0 - 46.0 %   MCV 87.1 78.0 - 100.0 fL   MCH 25.5 (L) 26.0 - 34.0 pg   MCHC 29.2 (L) 30.0 - 36.0 g/dL   RDW 18.0 (H) 11.5 - 15.5 %   Platelets 143 (L) 150 - 400 K/uL   Neutrophils Relative % 78 %   Neutro Abs 15.9 (H) 1.7 - 7.7 K/uL   Lymphocytes Relative 11 %    Lymphs Abs 2.3 0.7 - 4.0 K/uL   Monocytes Relative 10 %   Monocytes Absolute 2.1 (H) 0.1 - 1.0 K/uL   Eosinophils Relative 1 %   Eosinophils Absolute 0.2 0.0 - 0.7 K/uL   Basophils Relative 0 %   Basophils Absolute 0.0 0.0 - 0.1 K/uL  Glucose, capillary     Status: None   Collection Time: 11/10/16  7:46 AM  Result Value Ref Range   Glucose-Capillary 94 65 - 99 mg/dL  Urinalysis, Routine w reflex microscopic     Status: Abnormal   Collection Time: 11/10/16  9:41 AM  Result Value Ref Range   Color, Urine YELLOW YELLOW   APPearance CLOUDY (A) CLEAR   Specific Gravity, Urine 1.008 1.005 - 1.030   pH 5.0 5.0 - 8.0   Glucose, UA NEGATIVE NEGATIVE mg/dL   Hgb urine dipstick LARGE (A) NEGATIVE   Bilirubin Urine NEGATIVE NEGATIVE   Ketones, ur NEGATIVE NEGATIVE mg/dL   Protein, ur 30 (A) NEGATIVE mg/dL   Nitrite NEGATIVE NEGATIVE   Leukocytes, UA LARGE (A) NEGATIVE   RBC / HPF 6-30 0 - 5 RBC/hpf   WBC, UA TOO NUMEROUS TO COUNT 0 - 5 WBC/hpf   Bacteria, UA RARE (A) NONE SEEN   Squamous Epithelial / LPF NONE SEEN NONE SEEN   Hyaline Casts, UA PRESENT    Recent Results (from the past 240 hour(s))  Urine culture     Status: None   Collection Time: 11/07/16  8:10 AM  Result Value Ref Range  Status   Specimen Description URINE, CLEAN CATCH  Final   Special Requests NONE  Final   Culture NO GROWTH  Final   Report Status 11/08/2016 FINAL  Final  MRSA PCR Screening     Status: None   Collection Time: 11/10/16  2:01 AM  Result Value Ref Range Status   MRSA by PCR NEGATIVE NEGATIVE Final    Comment:        The GeneXpert MRSA Assay (FDA approved for NASAL specimens only), is one component of a comprehensive MRSA colonization surveillance program. It is not intended to diagnose MRSA infection nor to guide or monitor treatment for MRSA infections.     Renal Function:  Recent Labs  11/05/16 0313 11/27/2016 0815 11/24/2016 0821 11/07/16 0950 11/08/16 1046 11/09/16 0530  11/10/16 0211  CREATININE 1.08* 1.12* 1.12* 1.25* 1.44* 1.42* 1.55*   Estimated Creatinine Clearance: 31 mL/min (A) (by C-G formula based on SCr of 1.55 mg/dL (H)).  Radiologic Imaging: Ct Head Wo Contrast  Result Date: 11/09/2016 CLINICAL DATA:  [place] at skilled nursing facility. History of transitional cell carcinoma. EXAM: CT HEAD WITHOUT CONTRAST TECHNIQUE: Contiguous axial images were obtained from the base of the skull through the vertex without intravenous contrast. COMPARISON:  CT HEAD March 28, 2016 FINDINGS: BRAIN: No intraparenchymal hemorrhage, mass effect nor midline shift. The ventricles and sulci are normal for age. Patchy supratentorial white matter hypodensities less than expected for patient's age, though non-specific are most compatible with chronic small vessel ischemic disease. No acute large vascular territory infarcts. No abnormal extra-axial fluid collections. Basal cisterns are patent. VASCULAR: Mild normal calcific atherosclerosis of the carotid siphons. SKULL: No skull fracture. No significant scalp soft tissue swelling. SINUSES/ORBITS: Mild RIGHT maxillary sinus mucosal thickening without air-fluid levels. Mastoid air cells are well aerated. The included ocular globes and orbital contents are non-suspicious. OTHER: None. IMPRESSION: 1. Negative noncontrast CT HEAD for age. Electronically Signed   By: Elon Alas M.D.   On: 11/09/2016 23:00   Ct Abdomen Pelvis W Contrast  Result Date: 11/09/2016 CLINICAL DATA:  Abdominal pain and fever. Recent diagnosis of right upper pole transitional cell carcinoma. Recent biopsy and stent placement. EXAM: CT ABDOMEN AND PELVIS WITH CONTRAST TECHNIQUE: Multidetector CT imaging of the abdomen and pelvis was performed using the standard protocol following bolus administration of intravenous contrast. CONTRAST:  80 mL Isovue-300 COMPARISON:  09/17/2016 FINDINGS: Lower chest: Moderate bilateral pleural effusions with basilar atelectasis or  consolidation. Calcified granuloma in the right lung base. Large esophageal hiatal hernia. Coronary artery and aortic valve calcifications. Hepatobiliary: No focal liver abnormality is seen. Status post cholecystectomy. No biliary dilatation. Pancreas: Unremarkable. No pancreatic ductal dilatation or surrounding inflammatory changes. Spleen: Normal in size without focal abnormality. Adrenals/Urinary Tract: No adrenal gland nodules. Since the previous study, there is interval development of a large fluid collection involving the right kidney consistent with massive hydronephrosis. This collection measures about 9.9 cm in diameter. There is a ureteral catheter present with the proximal pigtail within this dilated structure. There is compression and thinning of the renal parenchyma. Renal parenchyma was normal in thickness on the prior study. There is interval development of a large amount of soft tissue density in the right renal fossa, extending to the paracaval retroperitoneum and peripancreatic regions as well a celiac axis. Soft tissue density extends along the right psoas muscle surrounding the ureteral stent down to the level of the pelvis. Soft tissue structure around the ureteral stent anterior to the psoas muscle measures  about 4.9 x 7.8 cm in diameter. Enlarged lymph nodes are suggested in the retrocrural and retroperitoneal para-aortic regions. This is demonstrating significant progression since previous study. Differential diagnosis would include large hematomas versus rapid development and progression of lymph node metastasis. No contrast material is demonstrated in the right renal collecting system The left kidney and ureter are unremarkable. The bladder is decompressed. Distal catheter pigtail is present in the bladder. Bladder cannot be evaluated due to decompression. Small amount of gas suggested in the bladder is likely iatrogenic. Stomach/Bowel: Stomach, small bowel, and colon are not abnormally  distended. Large amount of stool in the rectum. Scattered stool throughout the remainder the colon. Vascular/Lymphatic: Diffuse aortic and branch vessel calcifications. Extensive lymphadenopathy in the retroperitoneum, celiac axis, and pelvis. Reproductive: Status post hysterectomy. No adnexal masses. Other: There is moderate free fluid in the abdomen and pelvis. No free air is identified. Prominent diffuse edema throughout the subcutaneous fat of the abdomen and pelvis. Abdominal wall musculature appears intact. Musculoskeletal: Degenerative changes in the spine. No destructive bone lesions. IMPRESSION: 1. A right ureteral catheter has been placed. There is massive dilatation of the right renal collecting system with thinning of the right renal cortex. Large soft tissue mass lesions are demonstrated in the right renal fossa and extending along the course of the ureteral catheter. This likely represents metastatic disease or hematoma. A massively dilated ureter with hematoma could also potentially have this appearance. Prominent lymphadenopathy is suggested in the right pararenal spaces, retroperitoneum, celiac axis, and pelvis. Changes demonstrate significant progression since previous study, possibly indicating rapid progression of metastatic disease. Also could consider lymphoma. 2. Moderate free fluid in the abdomen and pelvis. 3. Diffuse soft tissue edema throughout the subcutaneous fat. 4. Moderate bilateral pleural effusions with basilar atelectasis or consolidation. Electronically Signed   By: Lucienne Capers M.D.   On: 11/09/2016 23:43   Ct Femur Left W Contrast  Result Date: 11/10/2016 CLINICAL DATA:  Left femur fracture status post fall 3 days ago. EXAM: CT OF THE LOWER RIGHT EXTREMITY WITH CONTRAST TECHNIQUE: Multidetector CT imaging of the lower right extremity was performed according to the standard protocol following intravenous contrast administration. COMPARISON:  None. CONTRAST:  188mL  ISOVUE-300 IOPAMIDOL (ISOVUE-300) INJECTION 61% FINDINGS: Bones/Joint/Cartilage No left hip fracture or dislocation. Left hip joint space is relatively well maintained. Left total knee arthroplasty without hardware failure or complication. Beam hardening artifact resulting from the hardware partially obscuring the adjacent soft tissue osseous structures. Mildly comminuted, nondisplaced fracture of the distal femoral metaphysis transfixed with a intramedullary nail. Near anatomic alignment. No hardware failure or complication. Postsurgical changes in the surrounding soft tissues. No other fracture or dislocation. Small knee joint effusion. No aggressive lytic or sclerotic osseous lesion. Normal alignment. No joint effusion. Ligaments Ligaments are suboptimally evaluated by CT. Muscles and Tendons Soft tissue edema in the subcutaneous fat of the left thigh primarily along the lateral aspect likely postsurgical. No fluid collection or hematoma. Quadriceps and patellar tendons are intact. Soft tissue No fluid collection or hematoma.  No soft tissue mass. IMPRESSION: 1. Interval ORIF of a distal left femoral metaphysis fracture in near anatomic alignment. Electronically Signed   By: Kathreen Devoid   On: 11/10/2016 08:12   Dg Chest Port 1 View  Result Date: 11/09/2016 CLINICAL DATA:  Leukocytosis EXAM: PORTABLE CHEST 1 VIEW COMPARISON:  10/19/2016 FINDINGS: Right shoulder replacement. Small bilateral effusions. Left greater than right lung base opacification. Cardiomegaly. No pneumothorax IMPRESSION: Small bilateral effusion with left  greater than right bibasilar airspace disease which may be secondary to atelectasis or pneumonia Electronically Signed   By: Donavan Foil M.D.   On: 11/09/2016 20:33    I independently reviewed the above imaging studies.  Impression/Assessment:  Transitional cell carcinoma of the right renal pelvis, recently diagnosed with ureteroscopy.  There appears to be significant progression of  her disease in her retroperitoneum/along the course of her ureter.  She does have what looks like hydronephrosis or intra/perinephric hematoma or abscess  Plan:  I spoke with this lady about the probable progression of her disease.  She I ssomewhat lethargic, so I don't know how much she understood.  I did say that we could make her feel better by having percutaneous drainage of this undrained fluid in or around the right kidney.  I've spoken with Dr. Corrie Dandy regarding this as well.  Although she did receive Lovenox this morning, I think it worthwhile to consider urgent drainage of this process.  That will, no down make her feel better.  Unfortunately, I think at this point.  If this  Is a neoplastic process, she is well passive curative stage, and I would strongly consider palliative care/ospice consultation.  I have made the patient nothing by mouth with the hope that later today.  She might be able to have this percutaneous drainage.

## 2016-11-11 DIAGNOSIS — N19 Unspecified kidney failure: Secondary | ICD-10-CM

## 2016-11-11 DIAGNOSIS — A499 Bacterial infection, unspecified: Secondary | ICD-10-CM

## 2016-11-11 DIAGNOSIS — A419 Sepsis, unspecified organism: Secondary | ICD-10-CM

## 2016-11-11 DIAGNOSIS — E8809 Other disorders of plasma-protein metabolism, not elsewhere classified: Secondary | ICD-10-CM | POA: Diagnosis present

## 2016-11-11 DIAGNOSIS — S72452K Displaced supracondylar fracture without intracondylar extension of lower end of left femur, subsequent encounter for closed fracture with nonunion: Secondary | ICD-10-CM

## 2016-11-11 DIAGNOSIS — J449 Chronic obstructive pulmonary disease, unspecified: Secondary | ICD-10-CM

## 2016-11-11 LAB — HIV ANTIBODY (ROUTINE TESTING W REFLEX): HIV Screen 4th Generation wRfx: NONREACTIVE

## 2016-11-11 LAB — GLUCOSE, CAPILLARY
GLUCOSE-CAPILLARY: 73 mg/dL (ref 65–99)
GLUCOSE-CAPILLARY: 78 mg/dL (ref 65–99)
GLUCOSE-CAPILLARY: 90 mg/dL (ref 65–99)
Glucose-Capillary: 69 mg/dL (ref 65–99)
Glucose-Capillary: 73 mg/dL (ref 65–99)
Glucose-Capillary: 81 mg/dL (ref 65–99)

## 2016-11-11 LAB — CBC WITH DIFFERENTIAL/PLATELET
BASOS ABS: 0 10*3/uL (ref 0.0–0.1)
BASOS PCT: 0 %
EOS ABS: 0.3 10*3/uL (ref 0.0–0.7)
Eosinophils Relative: 2 %
HCT: 27.2 % — ABNORMAL LOW (ref 36.0–46.0)
Hemoglobin: 8 g/dL — ABNORMAL LOW (ref 12.0–15.0)
LYMPHS ABS: 1.4 10*3/uL (ref 0.7–4.0)
Lymphocytes Relative: 9 %
MCH: 25.4 pg — AB (ref 26.0–34.0)
MCHC: 29.4 g/dL — AB (ref 30.0–36.0)
MCV: 86.3 fL (ref 78.0–100.0)
Monocytes Absolute: 0.8 10*3/uL (ref 0.1–1.0)
Monocytes Relative: 5 %
NEUTROS ABS: 12.8 10*3/uL — AB (ref 1.7–7.7)
Neutrophils Relative %: 84 %
Platelets: 146 10*3/uL — ABNORMAL LOW (ref 150–400)
RBC: 3.15 MIL/uL — ABNORMAL LOW (ref 3.87–5.11)
RDW: 18.1 % — ABNORMAL HIGH (ref 11.5–15.5)
WBC: 15.3 10*3/uL — ABNORMAL HIGH (ref 4.0–10.5)

## 2016-11-11 LAB — RENAL FUNCTION PANEL
ALBUMIN: 2.2 g/dL — AB (ref 3.5–5.0)
ANION GAP: 10 (ref 5–15)
BUN: 40 mg/dL — AB (ref 6–20)
CALCIUM: 8 mg/dL — AB (ref 8.9–10.3)
CO2: 25 mmol/L (ref 22–32)
Chloride: 109 mmol/L (ref 101–111)
Creatinine, Ser: 1.37 mg/dL — ABNORMAL HIGH (ref 0.44–1.00)
GFR calc Af Amer: 42 mL/min — ABNORMAL LOW (ref >60)
GFR calc non Af Amer: 36 mL/min — ABNORMAL LOW (ref >60)
GLUCOSE: 109 mg/dL — AB (ref 65–99)
PHOSPHORUS: 3.7 mg/dL (ref 2.5–4.6)
POTASSIUM: 4.5 mmol/L (ref 3.5–5.1)
SODIUM: 144 mmol/L (ref 135–145)

## 2016-11-11 LAB — URINE CULTURE: CULTURE: NO GROWTH

## 2016-11-11 LAB — LACTIC ACID, PLASMA: Lactic Acid, Venous: 2.8 mmol/L (ref 0.5–1.9)

## 2016-11-11 MED ORDER — ALBUMIN HUMAN 25 % IV SOLN
50.0000 g | Freq: Once | INTRAVENOUS | Status: AC
  Administered 2016-11-11: 50 g via INTRAVENOUS
  Filled 2016-11-11: qty 200

## 2016-11-11 NOTE — Progress Notes (Signed)
Pharmacy Antibiotic Note  Isabel Collins is a 76 y.o. female admitted on 10/08/2016 with ESBL UTI.  Pharmacy has been consulted for meropenem dosing. SCr relatively stable, low UOP, s/p R nephrostomy tube placement - kidney fluid culture pending.  Plan: -Continue meropenem 1g IV q12h -Follow-up cultures, LOT, ID recs  Height: 5\' 2"  (157.5 cm) Weight: 193 lb (87.5 kg) IBW/kg (Calculated) : 50.1  Temp (24hrs), Avg:97.8 F (36.6 C), Min:96.9 F (36.1 C), Max:99.4 F (37.4 C)   Recent Labs Lab 11/07/16 0950 11/08/16 1046 11/09/16 0530 11/09/16 0936 11/09/16 1903 11/10/16 0211 11/10/16 1921 11/11/16 0325 11/11/16 0818  WBC 16.3* 18.3*  --  21.8*  --  20.5*  --  15.3*  --   CREATININE 1.25* 1.44* 1.42*  --   --  1.55*  --  1.37*  --   LATICACIDVEN  --   --   --   --  4.1*  --  3.1*  --  2.8*    Estimated Creatinine Clearance: 35.3 mL/min (A) (by C-G formula based on SCr of 1.37 mg/dL (H)).    No Known Allergies  Antimicrobials this admission: 8/27 meropenem >>  8/27 vancomycin x1  Dose adjustments this admission: none  Microbiology results: 8/27 BCx: ESBL E.coli 8/27 UCx: ESBL. E.coli  8/27 MRSA PCR: neg 8/29 BCx: NGTD x5 9/5 urine: neg 9/7 BCx: NGTD 9/8 UCx: neg 9/8 kidney fluid Cx: GNRs  Thank you for allowing pharmacy to be a part of this patient's care.  Arrie Senate, PharmD PGY-2 Cardiology Pharmacy Resident Pager: [phone] 11/11/2016

## 2016-11-11 NOTE — Progress Notes (Signed)
PROGRESS NOTE    Isabel Collins  EHO:[id number] DOB: 1941/01/24 DOA: 10/19/2016 PCP: [name], MD    Brief Narrative:  Patient is 76 year old female history of COPD, diabetes, hypertension, hyperlipidemia recent diagnosis of right upper pole transitional cell carcinoma was seen by urology with recent uteroscopic/biopsy and stent placement was supposed to have a nephrectomy 3 days prior to admission presented to the ED from SNF after fall with left knee injury. Patient with traumatic closed displaced supracondylar fracture of the left femur. Patient seen by orthopedics and underwent IM nailing 11/30/2016. Hospitalization complicated by bacteremia. Patient with significant anasarca/volume overload, patient with no significant improvement with encephalopathy, poor urine output despite diuresis. Patient was subsequently transferred to the stepdown unit. Placed on IV Lasix. CT abdomen and pelvis which was obtained was abnormal concerning for massive hydronephrosis and worsening soft tissue density. Urology, ID and nephrology consulted. Patient status post nephrostomy tube placement with necrotic thick purulent sludge noted cultures pending.   Assessment & Plan:   Principal Problem:   Sepsis due to Escherichia coli (E. coli) (HCC) Active Problems:   Hydronephrosis of right kidney   Hypertension   Physical deconditioning   Hypothyroidism   Chronic obstructive pulmonary disease (HCC)   Type 2 diabetes mellitus with complication, without long-term current use of insulin (HCC)   Urothelial carcinoma of kidney, right (HCC)   Gastroesophageal reflux disease   History of recent fall   Right renal mass   Fracture of knee prosthesis (HCC)   AKI (acute kidney injury) ([place])   UTI due to extended-spectrum beta lactamase (ESBL) producing Escherichia coli   Traumatic closed displaced supracondylar fracture of left femur (HCC)   Acute renal failure (ARF) ([place])   Fall   Gram-negative bacteremia  Sepsis secondary to UTI ([place])   Transitional cell carcinoma (HCC)   Hypervolemia   HCAP (healthcare-associated pneumonia)   Leukocytosis   Encephalopathy acute   Abnormal CT scan, pelvis: 11/09/2016   Closed fracture of left distal femur (HCC)   Pleural effusion on right   Hypoalbuminemia  #1 closed displaced left supracondylar femur fracture secondary to mechanical fall. Patient status post IM nail [id number]/8887 with no complications. Patient with a worsening leukocytosis. Patient with encephalopathy. CT left femur negative for abscess formation. Per orthopedics.  #2 Escherichia coli bacteremia/Escherichia coli UTI Patient currently afebrile. Repeat blood cultures with no growth to date. Patient afebrile however with blood pressure that is borderline. Worsening leukocytosis. Continue IV meropenem. See #4. Due to worsening leukocytosis, abnormalities noted on CT scan with massive hydronephrosis and consent for rapid progression of metastatic disease. Patient status post nephrostomy tube placement with cultures pending. Leukocytosis trending down. ID following.  #3 constipation Felt likely secondary to pain regimen. CT abdomen and pelvis with large amount of stool in the rectum. Given a soapsuds enema.   #4 Sepsis presented on admission with multidrug resistant Escherichia coli/ multidrug resistant Escherichia coli UTI in the setting of right upper pole transitional cell carcinoma pending nephrectomy -  blood cultures consistent with Escherichia coli as well as urine cultures consistent with Escherichia coli. Escherichia coli multidrug resistant. Repeat blood cultures with no growth to date. Patient with a worsening leukocytosis. CT abdomen and pelvis with massive hydronephrosis on the right and large soft tissue mass lesions in the right renal fossa concerning for possible metastatic disease or hematoma. Prominent lymphadenopathy. Concern for rapid progression of metastatic disease. CT left femur  negative for any abscess formation. Worsening leukocytosis could be secondary to abnormalities  noted on CT scan. Patient status post right nephrostomy tube placement with sick, purulent/necrotic sludge noted which are pending for cultures. Leukocytosis trending down. Lactic acid level trending down. Continue IV Merrem. ID following and appreciate input and recommendations.   #5 acute metabolic encephalopathy Felt to be multifactorial secondary to acute sepsis and probable opioid pain medication. Patient drowsy and lethargic however easily arousable and answering questions appropriately. Ultram has been discontinued. CT head unremarkable. Ammonia level is within normal limits. Chest x-ray with small bilateral effusion left greater than right basilar airspace disease which may be atelectasis or pneumonia. Patient is not coughing. CT left femur negative for any acute abnormalities. CT abdomen and pelvis with a massive hydronephrosis of the right kidney, edema, large soft tissue mass lesions in the right renal fossa extending along the course of the ureter catheter. Status post right nephrostomy tube placement per interventional radiology 11/10/2016. Cultures pending. Continue empiric IV antibiotics. Urology following.   #6 massive right hydronephrosis/abnormal CT abdomen and pelvis Patient noted to have massive right hydronephrosis with abnormal CT abdomen and pelvis with worsening soft tissue mass noted in the right renal fossa. Patient status post stent placement. Concern for blockage of stent. Patient seen in consultation by urology. Patient status post right nephrostomy tube placement per interventional radiology 11/10/2016. Cultures pending. Per urology.   #7 acute kidney injury on chronic kidney disease stage II Felt to be initially secondary to sepsis/acute infection. Now secondary to obstructive uropathy secondary to urothelial cancer a CT scan showing severe right hydronephrosis.  CT abdomen and  pelvis done 11/09/2016 with a massive hydronephrosis of the right kidney, large soft tissue mass lesions demonstrated in the right renal fossa extending along the course of ureter catheter. Likely metastatic disease or hematoma. Massively dilated ureter with hematoma could also have this appearance. Prominent lymphadenopathy. Moderate free fluid in the abdomen and pelvis. Diffuse soft tissue edema throughout subcutaneous fat. Moderate bilateral pleural effusions with bilateral atelectasis or consolidation.   Patient noted to have poor urine output earlier on during the hospitalization. Patient third spacing with significant volume overload. His felt patient likely intravascularly volume depleted. Patient seen by nephrology and patient placed on IV fluids with hypotonic solution. Urine output of 1.150 L over the past 24 hours. Patient is status post right nephrostomy tube placement per interventional radiology. Output was purulent take necrotic sludge cultures pending. Patient on empiric IV antibiotics. Nephrology and urology following.  #8 hyperlipidemia Continue statin.  #9 metabolic acidosis/lactic acidosis Lactic acid level elevated likely secondary to acute septic infection. Patient noted to have a ESBL bacteremia and UTI currently on IV Merrem. Patient status post right nephrostomy tube placement with sick, purulent/necrotic/noted with cultures pending. Continue IV Merrem. Acidosis improved with bicarbonate drip. Discontinue bicarbonate drip.   #10 hypertension/borderline hypotension Blood pressure borderline. Patient has been seen by critical care medicine left increase patient's IV fluids. Follow.  #11 borderline type 2 diabetes mellitus CBGs ranging from 69 -90. Continue Sliding scale insulin.  #12 right upper tract urothelial carcinoma Patient seems to have had significant progression of disease in the retroperitoneum and along the course of the ureter per CT abdomen and pelvis of  11/09/2016. Patient also noted to have a significant right hydronephrosis. Patient had a reassessed by urology who recommended percutaneous drainage of undrained fluid around the right kidney. Patient status post right nephrostomy tube placement per Dr. Laurence Ferrari interventional radiology 11/10/2016 with sick, purulent/necrotic sludge noted. Cultures pending. Patient with complaints of pain around nephrostomy tube  site that is has only bloody drainage. Continue empiric IV antibiotics. Urology following.   #13 volume overload secondary to severe hypoalbuminemia Patient significantly volume overloaded on examination. Patient with significant lower extremity edema. Patient is +14.9 L during this hospitalization. Patient noted to be somewhat intravascularly volume depleted and a such diuretics have been discontinued. Patient placed on IV fluids with increasing urine output. Patient with a urine output of 1.150 L over the past 24 hours.  Patient received a dose of IV albumin 11/09/2016. Will give another dose of IV albumin 11/11/2016. Nephrology following and appreciate input and recommendations.  #14 Hypernatremia Improving with free water boluses and IV fluids.     DVT prophylaxis: Lovenox Code Status: Full Family Communication: Updated patient. No family at bedside. Disposition Plan: Likely skilled nursing facility when medically stable. Currently not ready for discharge.   Consultants:   Orthopedics: Dr. [name] 10/30/2016  Urology progress note Dr. [name] 10/30/2016  Nephrology : Dr. Florene Glen 11/10/2016  Infectious disease : Dr. Tommy Medal 11/10/2016  Urology: Dr.Dahlstedt 11/10/2016  Pccm: Dr Elwyn Reach 11/10/2016  Procedures:  CT chest 10/30/2016  Plain films of the pelvis 10/28/2016  Plain films of the left knee 10/22/2016  Renal ultrasound 10/21/2016  Intramedullary retrograde femoral nailing per Dr. [name] 11/04/2016  CT abdomen and pelvis 11/09/2016  CT head  11/09/2016  CT left femur 11/09/2016  Chest x-ray 11/09/2016  Albumin IV 1 11/09/2016, 11/11/2016  Right nephrostomy tube placement per interventional radiology Dr. Geroge Baseman 11/10/2016--- thick purulent necrotic sludge drained after nephrostomy tube placed.  Antimicrobials:   IV Ancef 10/30/2016>>>>> 11/07/2016  IV Merrem 10/26/2016>>>>  IV Zosyn a 27 [id number] dose.  IV vancomycin   Subjective: Patient laying in bed moaning. Patient complaining of pain around right nephrostomy tube. Patient states some improvement with shortness of breath. Patient denies any chest pain. No abdominal pain.   Objective: Vitals:   11/11/16 0000 11/11/16 0444 11/11/16 0500 11/11/16 0726  BP: (!) 103/59 (!) 96/54  95/79  Pulse: 76 (!) 113    Resp: 19 (!) 23    Temp: (!) 96.9 F (36.1 C) (!) 97 F (36.1 C)  97.6 F (36.4 C)  TempSrc: Axillary Axillary  Axillary  SpO2: 92% 93%    Weight:   87.5 kg (193 lb)   Height:        Intake/Output Summary (Last 24 hours) at 11/11/16 0934 Last data filed at 11/11/16 0400  Gross per 24 hour  Intake           3032.5 ml  Output             1001 ml  Net           2031.5 ml   Filed Weights   11/09/16 1224 11/10/16 1400 11/11/16 0500  Weight: 86.4 kg (190 lb 8 oz) 89.4 kg (197 lb) 87.5 kg (193 lb)    Examination:  General exam: Lethargy. Drowsy. Respiratory system: Clear to auscultation anterior lung fields.Decreased breath sounds in the bases.  Cardiovascular system: S1 & S2 heard, RRR. No JVD, murmurs, rubs, gallops or clicks. 3-4+ bilateral lower extremity edema. Gastrointestinal system: Abdomen is nondistended, soft and nontender. No organomegaly or masses felt. Normal bowel sounds heard. Right nephrostomy tube with bloody drainage. Central nervous system: Patient drowsy. No focal deficits.  Extremities: Knee immobilizer on left lower extremity. Symmetric 5 x 5 power. Skin: No rashes, lesions or ulcers Psychiatry: Judgement and insight appear  fair. Mood & affect appropriate.     Data  Reviewed: I have personally reviewed following labs and imaging studies  CBC:  Recent Labs Lab 11/07/16 0950 11/08/16 1046 11/09/16 0936 11/10/16 0211 11/11/16 0325  WBC 16.3* 18.3* 21.8* 20.5* 15.3*  NEUTROABS  --  15.6*  --  15.9* 12.8*  HGB 8.6* 9.1* 9.5* 8.3* 8.0*  HCT 28.0* 30.1* 32.2* 28.4* 27.2*  MCV 85.9 86.2 86.1 87.1 86.3  PLT 90* 128* 178 143* 161*   Basic Metabolic Panel:  Recent Labs Lab 11/07/16 0950 11/08/16 1046 11/09/16 0530 11/09/16 0936 11/10/16 0211 11/11/16 0325  NA 146* 147* 147*  --  148* 144  K 4.8 4.9 5.5* 5.0 5.1 4.5  CL 120* 120* 116*  --  115* 109  CO2 14* 17* 19*  --  20* 25  GLUCOSE 137* 111* 82  --  94 109*  BUN 28* 34* 38*  --  42* 40*  CREATININE 1.25* 1.44* 1.42*  --  1.55* 1.37*  CALCIUM 7.9* 8.0* 8.2*  --  8.4* 8.0*  MG  --  2.9*  --   --   --   --   PHOS  --   --   --   --   --  3.7   GFR: Estimated Creatinine Clearance: 35.3 mL/min (A) (by C-G formula based on SCr of 1.37 mg/dL (H)). Liver Function Tests:  Recent Labs Lab 11/07/16 0950 11/08/16 1046 11/11/16 0325  AST 69* 62*  --   ALT 12* 11*  --   ALKPHOS 94 104  --   BILITOT 0.4 0.7  --   PROT 5.1* 5.1*  --   ALBUMIN 1.4* 1.6* 2.2*   No results for input(s): LIPASE, AMYLASE in the last 168 hours.  Recent Labs Lab 11/09/16 1903  AMMONIA 15   Coagulation Profile: No results for input(s): INR, PROTIME in the last 168 hours. Cardiac Enzymes: No results for input(s): CKTOTAL, CKMB, CKMBINDEX, TROPONINI in the last 168 hours. BNP (last 3 results) No results for input(s): PROBNP in the last 8760 hours. HbA1C: No results for input(s): HGBA1C in the last 72 hours. CBG:  Recent Labs Lab 11/10/16 1648 11/10/16 1944 11/10/16 2326 11/11/16 0030 11/11/16 0801  GLUCAP 68 89 69 90 73   Lipid Profile: No results for input(s): CHOL, HDL, LDLCALC, TRIG, CHOLHDL, LDLDIRECT in the last 72 hours. Thyroid Function  Tests: No results for input(s): TSH, T4TOTAL, FREET4, T3FREE, THYROIDAB in the last 72 hours. Anemia Panel: No results for input(s): VITAMINB12, FOLATE, FERRITIN, TIBC, IRON, RETICCTPCT in the last 72 hours. Sepsis Labs:  Recent Labs Lab 11/09/16 1903 11/10/16 1921 11/11/16 0818  LATICACIDVEN 4.1* 3.1* 2.8*    Recent Results (from the past 240 hour(s))  Urine culture     Status: None   Collection Time: 11/07/16  8:10 AM  Result Value Ref Range Status   Specimen Description URINE, CLEAN CATCH  Final   Special Requests NONE  Final   Culture NO GROWTH  Final   Report Status 11/08/2016 FINAL  Final  Culture, blood (Routine X 2) w Reflex to ID Panel     Status: None (Preliminary result)   Collection Time: 11/09/16  7:02 PM  Result Value Ref Range Status   Specimen Description BLOOD RIGHT HAND  Final   Special Requests IN PEDIATRIC BOTTLE Blood Culture adequate volume  Final   Culture NO GROWTH < 12 HOURS  Final   Report Status PENDING  Incomplete  Culture, blood (Routine X 2) w Reflex to ID Panel  Status: None (Preliminary result)   Collection Time: 11/09/16  7:20 PM  Result Value Ref Range Status   Specimen Description BLOOD LEFT HAND  Final   Special Requests   Final    BOTTLES DRAWN AEROBIC ONLY Blood Culture adequate volume   Culture NO GROWTH < 12 HOURS  Final   Report Status PENDING  Incomplete  MRSA PCR Screening     Status: None   Collection Time: 11/10/16  2:01 AM  Result Value Ref Range Status   MRSA by PCR NEGATIVE NEGATIVE Final    Comment:        The GeneXpert MRSA Assay (FDA approved for NASAL specimens only), is one component of a comprehensive MRSA colonization surveillance program. It is not intended to diagnose MRSA infection nor to guide or monitor treatment for MRSA infections.   Urine Culture     Status: None   Collection Time: 11/10/16  9:41 AM  Result Value Ref Range Status   Specimen Description URINE, CATHETERIZED  Final   Special  Requests NONE  Final   Culture NO GROWTH  Final   Report Status 11/11/2016 FINAL  Final  Body fluid culture     Status: None (Preliminary result)   Collection Time: 11/10/16  1:39 PM  Result Value Ref Range Status   Specimen Description FLUID KIDNEY RIGHT  Final   Special Requests NONE  Final   Gram Stain   Final    ABUNDANT WBC PRESENT, PREDOMINANTLY MONONUCLEAR MODERATE GRAM NEGATIVE RODS    Culture   Final    ABUNDANT GRAM NEGATIVE RODS IDENTIFICATION AND SUSCEPTIBILITIES TO FOLLOW    Report Status PENDING  Incomplete         Radiology Studies: Ct Head Wo Contrast  Result Date: 11/09/2016 CLINICAL DATA:  [facility] at skilled nursing facility. History of transitional cell carcinoma. EXAM: CT HEAD WITHOUT CONTRAST TECHNIQUE: Contiguous axial images were obtained from the base of the skull through the vertex without intravenous contrast. COMPARISON:  CT HEAD March 28, 2016 FINDINGS: BRAIN: No intraparenchymal hemorrhage, mass effect nor midline shift. The ventricles and sulci are normal for age. Patchy supratentorial white matter hypodensities less than expected for patient's age, though non-specific are most compatible with chronic small vessel ischemic disease. No acute large vascular territory infarcts. No abnormal extra-axial fluid collections. Basal cisterns are patent. VASCULAR: Mild normal calcific atherosclerosis of the carotid siphons. SKULL: No skull fracture. No significant scalp soft tissue swelling. SINUSES/ORBITS: Mild RIGHT maxillary sinus mucosal thickening without air-fluid levels. Mastoid air cells are well aerated. The included ocular globes and orbital contents are non-suspicious. OTHER: None. IMPRESSION: 1. Negative noncontrast CT HEAD for age. Electronically Signed   By: Elon Alas M.D.   On: 11/09/2016 23:00   Ct Abdomen Pelvis W Contrast  Result Date: 11/09/2016 CLINICAL DATA:  Abdominal pain and fever. Recent diagnosis of right upper pole transitional cell  carcinoma. Recent biopsy and stent placement. EXAM: CT ABDOMEN AND PELVIS WITH CONTRAST TECHNIQUE: Multidetector CT imaging of the abdomen and pelvis was performed using the standard protocol following bolus administration of intravenous contrast. CONTRAST:  80 mL Isovue-300 COMPARISON:  09/17/2016 FINDINGS: Lower chest: Moderate bilateral pleural effusions with basilar atelectasis or consolidation. Calcified granuloma in the right lung base. Large esophageal hiatal hernia. Coronary artery and aortic valve calcifications. Hepatobiliary: No focal liver abnormality is seen. Status post cholecystectomy. No biliary dilatation. Pancreas: Unremarkable. No pancreatic ductal dilatation or surrounding inflammatory changes. Spleen: Normal in size without focal abnormality. Adrenals/Urinary Tract: No  adrenal gland nodules. Since the previous study, there is interval development of a large fluid collection involving the right kidney consistent with massive hydronephrosis. This collection measures about 9.9 cm in diameter. There is a ureteral catheter present with the proximal pigtail within this dilated structure. There is compression and thinning of the renal parenchyma. Renal parenchyma was normal in thickness on the prior study. There is interval development of a large amount of soft tissue density in the right renal fossa, extending to the paracaval retroperitoneum and peripancreatic regions as well a celiac axis. Soft tissue density extends along the right psoas muscle surrounding the ureteral stent down to the level of the pelvis. Soft tissue structure around the ureteral stent anterior to the psoas muscle measures about 4.9 x 7.8 cm in diameter. Enlarged lymph nodes are suggested in the retrocrural and retroperitoneal para-aortic regions. This is demonstrating significant progression since previous study. Differential diagnosis would include large hematomas versus rapid development and progression of lymph node  metastasis. No contrast material is demonstrated in the right renal collecting system The left kidney and ureter are unremarkable. The bladder is decompressed. Distal catheter pigtail is present in the bladder. Bladder cannot be evaluated due to decompression. Small amount of gas suggested in the bladder is likely iatrogenic. Stomach/Bowel: Stomach, small bowel, and colon are not abnormally distended. Large amount of stool in the rectum. Scattered stool throughout the remainder the colon. Vascular/Lymphatic: Diffuse aortic and branch vessel calcifications. Extensive lymphadenopathy in the retroperitoneum, celiac axis, and pelvis. Reproductive: Status post hysterectomy. No adnexal masses. Other: There is moderate free fluid in the abdomen and pelvis. No free air is identified. Prominent diffuse edema throughout the subcutaneous fat of the abdomen and pelvis. Abdominal wall musculature appears intact. Musculoskeletal: Degenerative changes in the spine. No destructive bone lesions. IMPRESSION: 1. A right ureteral catheter has been placed. There is massive dilatation of the right renal collecting system with thinning of the right renal cortex. Large soft tissue mass lesions are demonstrated in the right renal fossa and extending along the course of the ureteral catheter. This likely represents metastatic disease or hematoma. A massively dilated ureter with hematoma could also potentially have this appearance. Prominent lymphadenopathy is suggested in the right pararenal spaces, retroperitoneum, celiac axis, and pelvis. Changes demonstrate significant progression since previous study, possibly indicating rapid progression of metastatic disease. Also could consider lymphoma. 2. Moderate free fluid in the abdomen and pelvis. 3. Diffuse soft tissue edema throughout the subcutaneous fat. 4. Moderate bilateral pleural effusions with basilar atelectasis or consolidation. Electronically Signed   By: Lucienne Capers M.D.   On:  11/09/2016 23:43   Ct Femur Left W Contrast  Result Date: 11/10/2016 CLINICAL DATA:  Left femur fracture status post fall 3 days ago. EXAM: CT OF THE LOWER RIGHT EXTREMITY WITH CONTRAST TECHNIQUE: Multidetector CT imaging of the lower right extremity was performed according to the standard protocol following intravenous contrast administration. COMPARISON:  None. CONTRAST:  177mL ISOVUE-300 IOPAMIDOL (ISOVUE-300) INJECTION 61% FINDINGS: Bones/Joint/Cartilage No left hip fracture or dislocation. Left hip joint space is relatively well maintained. Left total knee arthroplasty without hardware failure or complication. Beam hardening artifact resulting from the hardware partially obscuring the adjacent soft tissue osseous structures. Mildly comminuted, nondisplaced fracture of the distal femoral metaphysis transfixed with a intramedullary nail. Near anatomic alignment. No hardware failure or complication. Postsurgical changes in the surrounding soft tissues. No other fracture or dislocation. Small knee joint effusion. No aggressive lytic or sclerotic osseous lesion. Normal alignment.  No joint effusion. Ligaments Ligaments are suboptimally evaluated by CT. Muscles and Tendons Soft tissue edema in the subcutaneous fat of the left thigh primarily along the lateral aspect likely postsurgical. No fluid collection or hematoma. Quadriceps and patellar tendons are intact. Soft tissue No fluid collection or hematoma.  No soft tissue mass. IMPRESSION: 1. Interval ORIF of a distal left femoral metaphysis fracture in near anatomic alignment. Electronically Signed   By: Kathreen Devoid   On: 11/10/2016 08:12   Dg Chest Port 1 View  Result Date: 11/09/2016 CLINICAL DATA:  Leukocytosis EXAM: PORTABLE CHEST 1 VIEW COMPARISON:  10/04/2016 FINDINGS: Right shoulder replacement. Small bilateral effusions. Left greater than right lung base opacification. Cardiomegaly. No pneumothorax IMPRESSION: Small bilateral effusion with left greater  than right bibasilar airspace disease which may be secondary to atelectasis or pneumonia Electronically Signed   By: Donavan Foil M.D.   On: 11/09/2016 20:33   Ir Nephrostomy Placement Right  Result Date: 11/10/2016 INDICATION: 76 year old female with advanced right-sided urothelial cancer and what appears to be severe right-sided hydronephrosis with unobstructed double-J ureteral stent. Additionally, she has urosepsis. She presents for percutaneous nephrostomy tube placement. EXAM: IR NEPHROSTOMY PLACEMENT RIGHT COMPARISON:  CT scan abdomen/pelvis 11/09/2016 MEDICATIONS: Patient is currently an inpatient in receiving intravenous antibiotics. No additional antibiotic prophylaxis was administered. ANESTHESIA/SEDATION: 1 mg Versed CONTRAST:  15 mL Isovue-300 - administered into the collecting system(s) FLUOROSCOPY TIME:  Fluoroscopy Time: 3 minutes 30 seconds (49.5 mGy). COMPLICATIONS: None immediate. PROCEDURE: Informed written consent was obtained from the patient after a thorough discussion of the procedural risks, benefits and alternatives. All questions were addressed. Maximal Sterile Barrier Technique was utilized including caps, mask, sterile gowns, sterile gloves, sterile drape, hand hygiene and skin antiseptic. A timeout was performed prior to the initiation of the procedure. The right flank was interrogated with ultrasound. The kidney is extremely abnormal in sonographic appearance. The kidney is diffusely heterogeneous with areas of hypoechoic, echogenic and intermediate echogenicity throughout. No definite hydronephrosis or dilated calices are identified. Local anesthesia was attained by infiltration with 1% lidocaine. Under real-time sonographic guidance, the kidney was punctured using a 21 gauge Accustick needle. A relatively hypoechoic portion of the kidney was targeted. The needle tip was easily visualized within the hypoechoic segment. Aspiration yields no material. A gentle injection of contrast  through the needle demonstrates irregular nonspecific patchy staining. It is unclear if the needle tip is within a renal calyx filled with thick material/debris or if it is within the renal parenchyma itself. The CT scan was brought up and evaluated real-time while ultrasound Ing the kidney. There is no definitively normal kidney or collecting system visible. Under fluoroscopy, the Accustick needle was advanced into the same location as the double-J ureteral stent which should be in the renal pelvis. Again, urine could not be aspirated. Injection of contrast material demonstrates irregular pooling of contrast. Again, it is unclear if this is within the renal collecting system or diseased parenchyma. The 0.018 inch wire was carefully advanced through the needle. This appears formed within a space. The needle was exchanged over the wire for the Accustick dilator. Aspiration yields a small amount of thick, purulent fluid/debris. A 0.035 Amplatz wire was then advanced. The Accustick sheath was removed. The skin tract was dilated to 10 [place] and a Cook 10.2 [place] drainage catheter was advanced over the wire. The drainage catheter is only partially formed. Aspiration again yields PICC bloody purulent/ debris filled material. No definite urine is aspirated. The catheter  was connected to gravity bag drainage and secured to the skin with 0 Prolene suture. IMPRESSION: 1. Successful placement of a 10 French percutaneous nephrostomy tube into the complex left renal mass in the region of the double-J ureteral stent. Of note, it is unclear if this drainage catheter is within the renal collecting system, or within heavily diseased renal parenchyma. No normal collecting system or urine could be identified. The catheter is draining thick bloody purulent/necrotic material. PLAN: 1. Cultures were sent. 2. Monitor drain output. 3. Do not flush drain. 4. Consider hospice consultation. Electronically Signed   By: Jacqulynn Cadet M.D.    On: 11/10/2016 14:40        Scheduled Meds: . atorvastatin  40 mg Oral QHS  . cycloSPORINE  1 drop Both Eyes BID  . DULoxetine  30 mg Oral q1800  . DULoxetine  60 mg Oral Daily  . feeding supplement (ENSURE ENLIVE)  237 mL Oral BID BM  . fluconazole  100 mg Oral Daily  . free water  250 mL Oral Q8H  . insulin aspart  0-9 Units Subcutaneous TID WC  . levothyroxine  75 mcg Oral QAC breakfast  . mometasone-formoterol  2 puff Inhalation BID  . pantoprazole  40 mg Oral Daily   Continuous Infusions: . sodium chloride 150 mL/hr at 11/10/16 2042  . meropenem (MERREM) IV Stopped (11/11/16 0602)     LOS: 13 days    Time spent: 30 minutes    THOMPSON,DANIEL, MD [facility] Pager [phone]  If 7PM-7AM, please contact night-coverage [email or web address] Password [facility] 11/11/2016, 9:34 AM

## 2016-11-11 NOTE — Progress Notes (Signed)
CSW continuing to follow for discharge planning and support. Per MD notes, patient not yet medically ready to transition back to SNF.  Laveda Abbe, [place] Clinical Social Worker [phone]

## 2016-11-11 NOTE — Progress Notes (Signed)
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 3  E coli bacteremia and UTI 4  Hypernatremia 5  Severe hypoalbuminemia 6  Shock due to sepsis 7  AKI, hemodynamic and obstructive-now nonoliguric  Plan: 1 reduce IVF, cont with hypotonic solution, PBCs or colloid  Subjective: Interval History: s/p PCN Objective: Vital signs in last 24 hours: Temp:  [96.9 F (36.1 C)-99.4 F (37.4 C)] 99.4 F (37.4 C) (09/09 1107) Pulse Rate:  [76-113] 105 (09/09 1107) Resp:  [19-25] 21 (09/09 1107) BP: (95-111)/(50-79) 111/62 (09/09 1107) SpO2:  [90 %-97 %] 94 % (09/09 1107) Weight:  [87.5 kg (193 lb)-89.4 kg (197 lb)] 87.5 kg (193 lb) (09/09 0500) Weight change: 2.948 kg (6 lb 8 oz)  Intake/Output from previous day: 09/08 0701 - 09/09 0700 In: 3032.5 [P.O.:100; I.V.:2432.5; IV Piggyback:500] Out: 1151 [Urine:1150; Stool:1] Intake/Output this shift: Total I/O In: 1000 [I.V.:900; IV Piggyback:100] Out: -   General appearance: alert and cooperative Resp: clear to auscultation bilaterally Chest wall: no tenderness Cardio: regular rate and rhythm, S1, S2 normal, no murmur, click, rub or gallop Extremities: edema 1+  Lab Results:  Recent Labs  11/10/16 0211 11/11/16 0325  WBC 20.5* 15.3*  HGB 8.3* 8.0*  HCT 28.4* 27.2*  PLT 143* 146*   BMET:  Recent Labs  11/10/16 0211 11/11/16 0325  NA 148* 144  K 5.1 4.5  CL 115* 109  CO2 20* 25  GLUCOSE 94 109*  BUN 42* 40*  CREATININE 1.55* 1.37*  CALCIUM 8.4* 8.0*   No results for input(s): PTH in the last 72 hours. Iron Studies: No results for input(s): IRON, TIBC, TRANSFERRIN, FERRITIN in the last 72 hours. Studies/Results: Ct Head Wo Contrast  Result Date: 11/09/2016 CLINICAL DATA:  [facility] at skilled nursing facility. History of transitional cell carcinoma. EXAM: CT HEAD WITHOUT CONTRAST TECHNIQUE: Contiguous axial images were obtained from the base of the skull through the vertex without intravenous  contrast. COMPARISON:  CT HEAD March 28, 2016 FINDINGS: BRAIN: No intraparenchymal hemorrhage, mass effect nor midline shift. The ventricles and sulci are normal for age. Patchy supratentorial white matter hypodensities less than expected for patient's age, though non-specific are most compatible with chronic small vessel ischemic disease. No acute large vascular territory infarcts. No abnormal extra-axial fluid collections. Basal cisterns are patent. VASCULAR: Mild normal calcific atherosclerosis of the carotid siphons. SKULL: No skull fracture. No significant scalp soft tissue swelling. SINUSES/ORBITS: Mild RIGHT maxillary sinus mucosal thickening without air-fluid levels. Mastoid air cells are well aerated. The included ocular globes and orbital contents are non-suspicious. OTHER: None. IMPRESSION: 1. Negative noncontrast CT HEAD for age. Electronically Signed   By: Elon Alas M.D.   On: 11/09/2016 23:00   Ct Abdomen Pelvis W Contrast  Result Date: 11/09/2016 CLINICAL DATA:  Abdominal pain and fever. Recent diagnosis of right upper pole transitional cell carcinoma. Recent biopsy and stent placement. EXAM: CT ABDOMEN AND PELVIS WITH CONTRAST TECHNIQUE: Multidetector CT imaging of the abdomen and pelvis was performed using the standard protocol following bolus administration of intravenous contrast. CONTRAST:  80 mL Isovue-300 COMPARISON:  09/17/2016 FINDINGS: Lower chest: Moderate bilateral pleural effusions with basilar atelectasis or consolidation. Calcified granuloma in the right lung base. Large esophageal hiatal hernia. Coronary artery and aortic valve calcifications. Hepatobiliary: No focal liver abnormality is seen. Status post cholecystectomy. No biliary dilatation. Pancreas: Unremarkable. No pancreatic ductal dilatation or surrounding inflammatory changes. Spleen: Normal in size without focal abnormality. Adrenals/Urinary Tract: No adrenal gland nodules.  Since the previous study, there is  interval development of a large fluid collection involving the right kidney consistent with massive hydronephrosis. This collection measures about 9.9 cm in diameter. There is a ureteral catheter present with the proximal pigtail within this dilated structure. There is compression and thinning of the renal parenchyma. Renal parenchyma was normal in thickness on the prior study. There is interval development of a large amount of soft tissue density in the right renal fossa, extending to the paracaval retroperitoneum and peripancreatic regions as well a celiac axis. Soft tissue density extends along the right psoas muscle surrounding the ureteral stent down to the level of the pelvis. Soft tissue structure around the ureteral stent anterior to the psoas muscle measures about 4.9 x 7.8 cm in diameter. Enlarged lymph nodes are suggested in the retrocrural and retroperitoneal para-aortic regions. This is demonstrating significant progression since previous study. Differential diagnosis would include large hematomas versus rapid development and progression of lymph node metastasis. No contrast material is demonstrated in the right renal collecting system The left kidney and ureter are unremarkable. The bladder is decompressed. Distal catheter pigtail is present in the bladder. Bladder cannot be evaluated due to decompression. Small amount of gas suggested in the bladder is likely iatrogenic. Stomach/Bowel: Stomach, small bowel, and colon are not abnormally distended. Large amount of stool in the rectum. Scattered stool throughout the remainder the colon. Vascular/Lymphatic: Diffuse aortic and branch vessel calcifications. Extensive lymphadenopathy in the retroperitoneum, celiac axis, and pelvis. Reproductive: Status post hysterectomy. No adnexal masses. Other: There is moderate free fluid in the abdomen and pelvis. No free air is identified. Prominent diffuse edema throughout the subcutaneous fat of the abdomen and  pelvis. Abdominal wall musculature appears intact. Musculoskeletal: Degenerative changes in the spine. No destructive bone lesions. IMPRESSION: 1. A right ureteral catheter has been placed. There is massive dilatation of the right renal collecting system with thinning of the right renal cortex. Large soft tissue mass lesions are demonstrated in the right renal fossa and extending along the course of the ureteral catheter. This likely represents metastatic disease or hematoma. A massively dilated ureter with hematoma could also potentially have this appearance. Prominent lymphadenopathy is suggested in the right pararenal spaces, retroperitoneum, celiac axis, and pelvis. Changes demonstrate significant progression since previous study, possibly indicating rapid progression of metastatic disease. Also could consider lymphoma. 2. Moderate free fluid in the abdomen and pelvis. 3. Diffuse soft tissue edema throughout the subcutaneous fat. 4. Moderate bilateral pleural effusions with basilar atelectasis or consolidation. Electronically Signed   By: Lucienne Capers M.D.   On: 11/09/2016 23:43   Ct Femur Left W Contrast  Result Date: 11/10/2016 CLINICAL DATA:  Left femur fracture status post fall 3 days ago. EXAM: CT OF THE LOWER RIGHT EXTREMITY WITH CONTRAST TECHNIQUE: Multidetector CT imaging of the lower right extremity was performed according to the standard protocol following intravenous contrast administration. COMPARISON:  None. CONTRAST:  178mL ISOVUE-300 IOPAMIDOL (ISOVUE-300) INJECTION 61% FINDINGS: Bones/Joint/Cartilage No left hip fracture or dislocation. Left hip joint space is relatively well maintained. Left total knee arthroplasty without hardware failure or complication. Beam hardening artifact resulting from the hardware partially obscuring the adjacent soft tissue osseous structures. Mildly comminuted, nondisplaced fracture of the distal femoral metaphysis transfixed with a intramedullary nail. Near  anatomic alignment. No hardware failure or complication. Postsurgical changes in the surrounding soft tissues. No other fracture or dislocation. Small knee joint effusion. No aggressive lytic or sclerotic osseous lesion. Normal alignment. No joint  effusion. Ligaments Ligaments are suboptimally evaluated by CT. Muscles and Tendons Soft tissue edema in the subcutaneous fat of the left thigh primarily along the lateral aspect likely postsurgical. No fluid collection or hematoma. Quadriceps and patellar tendons are intact. Soft tissue No fluid collection or hematoma.  No soft tissue mass. IMPRESSION: 1. Interval ORIF of a distal left femoral metaphysis fracture in near anatomic alignment. Electronically Signed   By: Kathreen Devoid   On: 11/10/2016 08:12   Dg Chest Port 1 View  Result Date: 11/09/2016 CLINICAL DATA:  Leukocytosis EXAM: PORTABLE CHEST 1 VIEW COMPARISON:  10/28/2016 FINDINGS: Right shoulder replacement. Small bilateral effusions. Left greater than right lung base opacification. Cardiomegaly. No pneumothorax IMPRESSION: Small bilateral effusion with left greater than right bibasilar airspace disease which may be secondary to atelectasis or pneumonia Electronically Signed   By: Donavan Foil M.D.   On: 11/09/2016 20:33   Ir Nephrostomy Placement Right  Result Date: 11/10/2016 INDICATION: 77 year old female with advanced right-sided urothelial cancer and what appears to be severe right-sided hydronephrosis with unobstructed double-J ureteral stent. Additionally, she has urosepsis. She presents for percutaneous nephrostomy tube placement. EXAM: IR NEPHROSTOMY PLACEMENT RIGHT COMPARISON:  CT scan abdomen/pelvis 11/09/2016 MEDICATIONS: Patient is currently an inpatient in receiving intravenous antibiotics. No additional antibiotic prophylaxis was administered. ANESTHESIA/SEDATION: 1 mg Versed CONTRAST:  15 mL Isovue-300 - administered into the collecting system(s) FLUOROSCOPY TIME:  Fluoroscopy Time: 3  minutes 30 seconds (49.5 mGy). COMPLICATIONS: None immediate. PROCEDURE: Informed written consent was obtained from the patient after a thorough discussion of the procedural risks, benefits and alternatives. All questions were addressed. Maximal Sterile Barrier Technique was utilized including caps, mask, sterile gowns, sterile gloves, sterile drape, hand hygiene and skin antiseptic. A timeout was performed prior to the initiation of the procedure. The right flank was interrogated with ultrasound. The kidney is extremely abnormal in sonographic appearance. The kidney is diffusely heterogeneous with areas of hypoechoic, echogenic and intermediate echogenicity throughout. No definite hydronephrosis or dilated calices are identified. Local anesthesia was attained by infiltration with 1% lidocaine. Under real-time sonographic guidance, the kidney was punctured using a 21 gauge Accustick needle. A relatively hypoechoic portion of the kidney was targeted. The needle tip was easily visualized within the hypoechoic segment. Aspiration yields no material. A gentle injection of contrast through the needle demonstrates irregular nonspecific patchy staining. It is unclear if the needle tip is within a renal calyx filled with thick material/debris or if it is within the renal parenchyma itself. The CT scan was brought up and evaluated real-time while ultrasound Ing the kidney. There is no definitively normal kidney or collecting system visible. Under fluoroscopy, the Accustick needle was advanced into the same location as the double-J ureteral stent which should be in the renal pelvis. Again, urine could not be aspirated. Injection of contrast material demonstrates irregular pooling of contrast. Again, it is unclear if this is within the renal collecting system or diseased parenchyma. The 0.018 inch wire was carefully advanced through the needle. This appears formed within a space. The needle was exchanged over the wire for the  Accustick dilator. Aspiration yields a small amount of thick, purulent fluid/debris. A 0.035 Amplatz wire was then advanced. The Accustick sheath was removed. The skin tract was dilated to 10 [place] and a Cook 10.2 [place] drainage catheter was advanced over the wire. The drainage catheter is only partially formed. Aspiration again yields PICC bloody purulent/ debris filled material. No definite urine is aspirated. The catheter was connected  to gravity bag drainage and secured to the skin with 0 Prolene suture. IMPRESSION: 1. Successful placement of a 10 French percutaneous nephrostomy tube into the complex left renal mass in the region of the double-J ureteral stent. Of note, it is unclear if this drainage catheter is within the renal collecting system, or within heavily diseased renal parenchyma. No normal collecting system or urine could be identified. The catheter is draining thick bloody purulent/necrotic material. PLAN: 1. Cultures were sent. 2. Monitor drain output. 3. Do not flush drain. 4. Consider hospice consultation. Electronically Signed   By: Jacqulynn Cadet M.D.   On: 11/10/2016 14:40    Scheduled: . atorvastatin  40 mg Oral QHS  . cycloSPORINE  1 drop Both Eyes BID  . DULoxetine  30 mg Oral q1800  . DULoxetine  60 mg Oral Daily  . feeding supplement (ENSURE ENLIVE)  237 mL Oral BID BM  . fluconazole  100 mg Oral Daily  . free water  250 mL Oral Q8H  . insulin aspart  0-9 Units Subcutaneous TID WC  . levothyroxine  75 mcg Oral QAC breakfast  . mometasone-formoterol  2 puff Inhalation BID  . pantoprazole  40 mg Oral Daily    LOS: 13 days   Mettie Roylance C 11/11/2016,11:48 AM

## 2016-11-11 NOTE — Progress Notes (Signed)
Subjective: Pt fairly somnolent   Antibiotics:  Anti-infectives    Start     Dose/Rate Route Frequency Ordered Stop   11/07/16 0900  ceFAZolin (ANCEF) IVPB 2g/100 mL premix  Status:  Discontinued     2 g 200 mL/hr over 30 Minutes Intravenous To Short Stay 11/07/16 0809 11/07/16 0934   11/02/16 0700  ceFAZolin (ANCEF) IVPB 2g/100 mL premix     2 g 200 mL/hr over 30 Minutes Intravenous To Short Stay 10/30/16 1212 11/03/16 0700   11/02/16 0700  gentamicin (GARAMYCIN) 280 mg in dextrose 5 % 100 mL IVPB  Status:  Discontinued     5 mg/kg  56.2 kg (Adjusted) 107 mL/hr over 60 Minutes Intravenous 30 min pre-op 11/01/16 0932 11/02/16 1006   10/30/16 1000  fluconazole (DIFLUCAN) tablet 100 mg     100 mg Oral Daily 10/10/2016 1841     10/30/16 0600  ceFAZolin (ANCEF) IVPB 2g/100 mL premix  Status:  Discontinued     2 g 200 mL/hr over 30 Minutes Intravenous On call to O.R. 10/28/2016 1925 10/30/16 1212   10/30/16 0500  meropenem (MERREM) 1 g in sodium chloride 0.9 % 100 mL IVPB     1 g 200 mL/hr over 30 Minutes Intravenous Every 12 hours 10/19/2016 1632     10/08/2016 1630  meropenem (MERREM) 1 g in sodium chloride 0.9 % 100 mL IVPB     1 g 200 mL/hr over 30 Minutes Intravenous STAT 10/28/2016 1616 10/16/2016 1919   10/21/2016 1545  vancomycin (VANCOCIN) IVPB 1000 mg/200 mL premix     1,000 mg 200 mL/hr over 60 Minutes Intravenous  Once 10/19/2016 1543 10/04/2016 1727   10/22/2016 1545  piperacillin-tazobactam (ZOSYN) IVPB 3.375 g  Status:  Discontinued     3.375 g 100 mL/hr over 30 Minutes Intravenous  Once 10/27/2016 1543 10/05/2016 1546      Medications: Scheduled Meds: . atorvastatin  40 mg Oral QHS  . cycloSPORINE  1 drop Both Eyes BID  . DULoxetine  30 mg Oral q1800  . DULoxetine  60 mg Oral Daily  . feeding supplement (ENSURE ENLIVE)  237 mL Oral BID BM  . fluconazole  100 mg Oral Daily  . free water  250 mL Oral Q8H  . insulin aspart  0-9 Units Subcutaneous TID WC  . levothyroxine   75 mcg Oral QAC breakfast  . mometasone-formoterol  2 puff Inhalation BID  . pantoprazole  40 mg Oral Daily   Continuous Infusions: . sodium chloride 150 mL/hr at 11/10/16 2042  . meropenem (MERREM) IV Stopped (11/11/16 0602)   PRN Meds:.acetaminophen, albuterol, bisacodyl, fluticasone, senna-docusate    Objective: Weight change: 6 lb 8 oz (2.948 kg)  Intake/Output Summary (Last 24 hours) at 11/11/16 1229 Last data filed at 11/11/16 1000  Gross per 24 hour  Intake           4032.5 ml  Output             1001 ml  Net           3031.5 ml   Blood pressure 111/62, pulse (!) 105, temperature 99.4 F (37.4 C), temperature source Axillary, resp. rate (!) 21, height 5\' 2"  (1.575 m), weight 193 lb (87.5 kg), SpO2 94 %. Temp:  [96.9 F (36.1 C)-99.4 F (37.4 C)] 99.4 F (37.4 C) (09/09 1107) Pulse Rate:  [76-113] 105 (09/09 1107) Resp:  [19-25] 21 (09/09 1107) BP: (95-111)/(50-79) 111/62 (09/09 1107) SpO2:  [  90 %-97 %] 94 % (09/09 1107) Weight:  [193 lb (87.5 kg)-197 lb (89.4 kg)] 193 lb (87.5 kg) (09/09 0500)  Physical Exam: General sleepy but arousable confused HEENT EOMI CVS tach rate, normal r,  no murmur rubs or gallops Chest: clear to auscultation bilaterally, no wheezing, rales or rhonchi Abdomen: soft nondistended, normal bowel sounds, drain with bloody material in place Extremities: femur site bandaged Skin: no rashes  Neuro: nonfocal  CBC: CBC Latest Ref Rng & Units 11/11/2016 11/10/2016 11/09/2016  WBC 4.0 - 10.5 K/uL 15.3(H) 20.5(H) 21.8(H)  Hemoglobin 12.0 - 15.0 g/dL 8.0(L) 8.3(L) 9.5(L)  Hematocrit 36.0 - 46.0 % 27.2(L) 28.4(L) 32.2(L)  Platelets 150 - 400 K/uL 146(L) 143(L) 178     BMET  Recent Labs  11/10/16 0211 11/11/16 0325  NA 148* 144  K 5.1 4.5  CL 115* 109  CO2 20* 25  GLUCOSE 94 109*  BUN 42* 40*  CREATININE 1.55* 1.37*  CALCIUM 8.4* 8.0*     Liver Panel   Recent Labs  11/11/16 0325  ALBUMIN 2.2*       Sedimentation Rate No  results for input(s): ESRSEDRATE in the last 72 hours. C-Reactive Protein No results for input(s): CRP in the last 72 hours.  Micro Results: Recent Results (from the past 720 hour(s))  Culture, blood (routine x 2)     Status: None   Collection Time: 10/14/16  6:58 PM  Result Value Ref Range Status   Specimen Description BLOOD RIGHT ARM  Final   Special Requests IN PEDIATRIC BOTTLE Blood Culture adequate volume  Final   Culture   Final    NO GROWTH 5 DAYS Performed at [facility], [address], [place], [place] [address]    Report Status 10/20/2016 FINAL  Final  Culture, blood (routine x 2)     Status: None   Collection Time: 10/14/16  6:58 PM  Result Value Ref Range Status   Specimen Description BLOOD RIGHT HAND  Final   Special Requests IN PEDIATRIC BOTTLE Blood Culture adequate volume  Final   Culture   Final    NO GROWTH 5 DAYS Performed at [facility], [place] [address] [place], [place] [address]    Report Status 10/20/2016 FINAL  Final  Blood culture (routine x 2)     Status: Abnormal   Collection Time: 10/03/2016  2:03 PM  Result Value Ref Range Status   Specimen Description BLOOD PICC LINE RIGHT ARM  Final   Special Requests   Final    BOTTLES DRAWN AEROBIC AND ANAEROBIC Blood Culture adequate volume   Culture  Setup Time   Final    GRAM NEGATIVE RODS IN BOTH AEROBIC AND ANAEROBIC BOTTLES CRITICAL RESULT CALLED TO, READ BACK BY AND VERIFIED WITH: T.EGAN, PHARMD 10/30/16 0715 L.CHAMPION    Culture (A)  Final    ESCHERICHIA COLI Confirmed Extended Spectrum Beta-Lactamase Producer (ESBL) Performed at [facility], [place] [address], [place],  [address]    Report Status 11/01/2016 FINAL  Final   Organism ID, Bacteria ESCHERICHIA COLI  Final      Susceptibility   Escherichia coli - MIC*    AMPICILLIN >=32 RESISTANT Resistant     CEFAZOLIN >=64 RESISTANT Resistant     CEFEPIME 32 RESISTANT Resistant     CEFTAZIDIME 16 INTERMEDIATE Intermediate       CEFTRIAXONE >=64 RESISTANT Resistant     CIPROFLOXACIN >=4 RESISTANT Resistant     GENTAMICIN <=1 SENSITIVE Sensitive  IMIPENEM <=0.25 SENSITIVE Sensitive     TRIMETH/SULFA >=320 RESISTANT Resistant     AMPICILLIN/SULBACTAM >=32 RESISTANT Resistant     PIP/TAZO 8 SENSITIVE Sensitive     Extended ESBL POSITIVE Resistant     * ESCHERICHIA COLI  Urine culture     Status: Abnormal   Collection Time: 10/20/2016  2:03 PM  Result Value Ref Range Status   Specimen Description URINE, RANDOM  Final   Special Requests NONE  Final   Culture (A)  Final    >=100,000 COLONIES/mL ESCHERICHIA COLI Confirmed Extended Spectrum Beta-Lactamase Producer (ESBL) Performed at [facility], [place] [address], [place], [place] [address]    Report Status 11/01/2016 FINAL  Final   Organism ID, Bacteria ESCHERICHIA COLI (A)  Final      Susceptibility   Escherichia coli - MIC*    AMPICILLIN >=32 RESISTANT Resistant     CEFAZOLIN >=64 RESISTANT Resistant     CEFTRIAXONE >=64 RESISTANT Resistant     CIPROFLOXACIN >=4 RESISTANT Resistant     GENTAMICIN <=1 SENSITIVE Sensitive     IMIPENEM <=0.25 SENSITIVE Sensitive     NITROFURANTOIN <=16 SENSITIVE Sensitive     TRIMETH/SULFA >=320 RESISTANT Resistant     AMPICILLIN/SULBACTAM >=32 RESISTANT Resistant     PIP/TAZO 8 SENSITIVE Sensitive     Extended ESBL POSITIVE Resistant     * >=100,000 COLONIES/mL ESCHERICHIA COLI  Blood Culture ID Panel (Reflexed)     Status: Abnormal   Collection Time: 10/06/2016  2:03 PM  Result Value Ref Range Status   Enterococcus species NOT DETECTED NOT DETECTED Final   Listeria monocytogenes NOT DETECTED NOT DETECTED Final   Staphylococcus species NOT DETECTED NOT DETECTED Final   Staphylococcus aureus NOT DETECTED NOT DETECTED Final   Streptococcus species NOT DETECTED NOT DETECTED Final   Streptococcus agalactiae NOT DETECTED NOT DETECTED Final   Streptococcus pneumoniae NOT DETECTED NOT DETECTED Final   Streptococcus  pyogenes NOT DETECTED NOT DETECTED Final   Acinetobacter baumannii NOT DETECTED NOT DETECTED Final   Enterobacteriaceae species DETECTED (A) NOT DETECTED Final    Comment: Enterobacteriaceae represent a large family of gram-negative bacteria, not a single organism. CRITICAL RESULT CALLED TO, READ BACK BY AND VERIFIED WITH: T.EGAN, PHARMD 10/30/16 0715 L.CHAMPION    Enterobacter cloacae complex NOT DETECTED NOT DETECTED Final   Escherichia coli DETECTED (A) NOT DETECTED Final    Comment: CRITICAL RESULT CALLED TO, READ BACK BY AND VERIFIED WITH: T.EGAN, PHARMD 10/30/16 0715 L.CHAMPION    Klebsiella oxytoca NOT DETECTED NOT DETECTED Final   Klebsiella pneumoniae NOT DETECTED NOT DETECTED Final   Proteus species NOT DETECTED NOT DETECTED Final   Serratia marcescens NOT DETECTED NOT DETECTED Final   Carbapenem resistance NOT DETECTED NOT DETECTED Final   Haemophilus influenzae NOT DETECTED NOT DETECTED Final   Neisseria meningitidis NOT DETECTED NOT DETECTED Final   Pseudomonas aeruginosa NOT DETECTED NOT DETECTED Final   Candida albicans NOT DETECTED NOT DETECTED Final   Candida glabrata NOT DETECTED NOT DETECTED Final   Candida krusei NOT DETECTED NOT DETECTED Final   Candida parapsilosis NOT DETECTED NOT DETECTED Final   Candida tropicalis NOT DETECTED NOT DETECTED Final    Comment: Performed at [facility], [place] [address], [place], [place] [address]  Blood culture (routine x 2)     Status: None   Collection Time: 10/22/2016  2:27 PM  Result Value Ref Range Status   Specimen Description BLOOD BLOOD RIGHT HAND  Final  Special Requests IN PEDIATRIC BOTTLE Blood Culture adequate volume  Final   Culture   Final    NO GROWTH 5 DAYS Performed at [facility], [place] [address], [place], [place] [address]    Report Status 11/03/2016 FINAL  Final  Surgical pcr screen     Status: None   Collection Time: 10/10/2016  9:08 PM  Result Value Ref Range Status   MRSA, PCR NEGATIVE NEGATIVE  Final   Staphylococcus aureus NEGATIVE NEGATIVE Final    Comment:        The Xpert SA Assay (FDA approved for NASAL specimens in patients over 58 years of age), is one component of a comprehensive surveillance program.  Test performance has been validated by [facility] for patients greater than or equal to 69 year old. It is not intended to diagnose infection nor to guide or monitor treatment.   Culture, blood (routine x 2)     Status: None   Collection Time: 10/31/16  5:30 AM  Result Value Ref Range Status   Specimen Description BLOOD LEFT ASSIST CONTROL  Final   Special Requests IN PEDIATRIC BOTTLE Blood Culture adequate volume  Final   Culture NO GROWTH 5 DAYS  Final   Report Status 11/05/2016 FINAL  Final  Culture, blood (routine x 2)     Status: None   Collection Time: 10/31/16  5:41 AM  Result Value Ref Range Status   Specimen Description BLOOD LEFT HAND  Final   Special Requests IN PEDIATRIC BOTTLE Blood Culture adequate volume  Final   Culture NO GROWTH 5 DAYS  Final   Report Status 11/05/2016 FINAL  Final  Urine culture     Status: None   Collection Time: 11/07/16  8:10 AM  Result Value Ref Range Status   Specimen Description URINE, CLEAN CATCH  Final   Special Requests NONE  Final   Culture NO GROWTH  Final   Report Status 11/08/2016 FINAL  Final  Culture, blood (Routine X 2) w Reflex to ID Panel     Status: None (Preliminary result)   Collection Time: 11/09/16  7:02 PM  Result Value Ref Range Status   Specimen Description BLOOD RIGHT HAND  Final   Special Requests IN PEDIATRIC BOTTLE Blood Culture adequate volume  Final   Culture NO GROWTH 2 DAYS  Final   Report Status PENDING  Incomplete  Culture, blood (Routine X 2) w Reflex to ID Panel     Status: None (Preliminary result)   Collection Time: 11/09/16  7:20 PM  Result Value Ref Range Status   Specimen Description BLOOD LEFT HAND  Final   Special Requests   Final    BOTTLES DRAWN AEROBIC ONLY Blood Culture  adequate volume   Culture NO GROWTH 2 DAYS  Final   Report Status PENDING  Incomplete  MRSA PCR Screening     Status: None   Collection Time: 11/10/16  2:01 AM  Result Value Ref Range Status   MRSA by PCR NEGATIVE NEGATIVE Final    Comment:        The GeneXpert MRSA Assay (FDA approved for NASAL specimens only), is one component of a comprehensive MRSA colonization surveillance program. It is not intended to diagnose MRSA infection nor to guide or monitor treatment for MRSA infections.   Urine Culture     Status: None   Collection Time: 11/10/16  9:41 AM  Result Value Ref Range Status   Specimen Description URINE, CATHETERIZED  Final  Special Requests NONE  Final   Culture NO GROWTH  Final   Report Status 11/11/2016 FINAL  Final  Body fluid culture     Status: None (Preliminary result)   Collection Time: 11/10/16  1:39 PM  Result Value Ref Range Status   Specimen Description FLUID KIDNEY RIGHT  Final   Special Requests NONE  Final   Gram Stain   Final    ABUNDANT WBC PRESENT, PREDOMINANTLY MONONUCLEAR MODERATE GRAM NEGATIVE RODS    Culture   Final    ABUNDANT GRAM NEGATIVE RODS IDENTIFICATION AND SUSCEPTIBILITIES TO FOLLOW    Report Status PENDING  Incomplete    Studies/Results: Ct Head Wo Contrast  Result Date: 11/09/2016 CLINICAL DATA:  [facility] at skilled nursing facility. History of transitional cell carcinoma. EXAM: CT HEAD WITHOUT CONTRAST TECHNIQUE: Contiguous axial images were obtained from the base of the skull through the vertex without intravenous contrast. COMPARISON:  CT HEAD March 28, 2016 FINDINGS: BRAIN: No intraparenchymal hemorrhage, mass effect nor midline shift. The ventricles and sulci are normal for age. Patchy supratentorial white matter hypodensities less than expected for patient's age, though non-specific are most compatible with chronic small vessel ischemic disease. No acute large vascular territory infarcts. No abnormal extra-axial fluid  collections. Basal cisterns are patent. VASCULAR: Mild normal calcific atherosclerosis of the carotid siphons. SKULL: No skull fracture. No significant scalp soft tissue swelling. SINUSES/ORBITS: Mild RIGHT maxillary sinus mucosal thickening without air-fluid levels. Mastoid air cells are well aerated. The included ocular globes and orbital contents are non-suspicious. OTHER: None. IMPRESSION: 1. Negative noncontrast CT HEAD for age. Electronically Signed   By: Elon Alas M.D.   On: 11/09/2016 23:00   Ct Abdomen Pelvis W Contrast  Result Date: 11/09/2016 CLINICAL DATA:  Abdominal pain and fever. Recent diagnosis of right upper pole transitional cell carcinoma. Recent biopsy and stent placement. EXAM: CT ABDOMEN AND PELVIS WITH CONTRAST TECHNIQUE: Multidetector CT imaging of the abdomen and pelvis was performed using the standard protocol following bolus administration of intravenous contrast. CONTRAST:  80 mL Isovue-300 COMPARISON:  09/17/2016 FINDINGS: Lower chest: Moderate bilateral pleural effusions with basilar atelectasis or consolidation. Calcified granuloma in the right lung base. Large esophageal hiatal hernia. Coronary artery and aortic valve calcifications. Hepatobiliary: No focal liver abnormality is seen. Status post cholecystectomy. No biliary dilatation. Pancreas: Unremarkable. No pancreatic ductal dilatation or surrounding inflammatory changes. Spleen: Normal in size without focal abnormality. Adrenals/Urinary Tract: No adrenal gland nodules. Since the previous study, there is interval development of a large fluid collection involving the right kidney consistent with massive hydronephrosis. This collection measures about 9.9 cm in diameter. There is a ureteral catheter present with the proximal pigtail within this dilated structure. There is compression and thinning of the renal parenchyma. Renal parenchyma was normal in thickness on the prior study. There is interval development of a large  amount of soft tissue density in the right renal fossa, extending to the paracaval retroperitoneum and peripancreatic regions as well a celiac axis. Soft tissue density extends along the right psoas muscle surrounding the ureteral stent down to the level of the pelvis. Soft tissue structure around the ureteral stent anterior to the psoas muscle measures about 4.9 x 7.8 cm in diameter. Enlarged lymph nodes are suggested in the retrocrural and retroperitoneal para-aortic regions. This is demonstrating significant progression since previous study. Differential diagnosis would include large hematomas versus rapid development and progression of lymph node metastasis. No contrast material is demonstrated in the right renal collecting system The  left kidney and ureter are unremarkable. The bladder is decompressed. Distal catheter pigtail is present in the bladder. Bladder cannot be evaluated due to decompression. Small amount of gas suggested in the bladder is likely iatrogenic. Stomach/Bowel: Stomach, small bowel, and colon are not abnormally distended. Large amount of stool in the rectum. Scattered stool throughout the remainder the colon. Vascular/Lymphatic: Diffuse aortic and branch vessel calcifications. Extensive lymphadenopathy in the retroperitoneum, celiac axis, and pelvis. Reproductive: Status post hysterectomy. No adnexal masses. Other: There is moderate free fluid in the abdomen and pelvis. No free air is identified. Prominent diffuse edema throughout the subcutaneous fat of the abdomen and pelvis. Abdominal wall musculature appears intact. Musculoskeletal: Degenerative changes in the spine. No destructive bone lesions. IMPRESSION: 1. A right ureteral catheter has been placed. There is massive dilatation of the right renal collecting system with thinning of the right renal cortex. Large soft tissue mass lesions are demonstrated in the right renal fossa and extending along the course of the ureteral catheter.  This likely represents metastatic disease or hematoma. A massively dilated ureter with hematoma could also potentially have this appearance. Prominent lymphadenopathy is suggested in the right pararenal spaces, retroperitoneum, celiac axis, and pelvis. Changes demonstrate significant progression since previous study, possibly indicating rapid progression of metastatic disease. Also could consider lymphoma. 2. Moderate free fluid in the abdomen and pelvis. 3. Diffuse soft tissue edema throughout the subcutaneous fat. 4. Moderate bilateral pleural effusions with basilar atelectasis or consolidation. Electronically Signed   By: Lucienne Capers M.D.   On: 11/09/2016 23:43   Ct Femur Left W Contrast  Result Date: 11/10/2016 CLINICAL DATA:  Left femur fracture status post fall 3 days ago. EXAM: CT OF THE LOWER RIGHT EXTREMITY WITH CONTRAST TECHNIQUE: Multidetector CT imaging of the lower right extremity was performed according to the standard protocol following intravenous contrast administration. COMPARISON:  None. CONTRAST:  140mL ISOVUE-300 IOPAMIDOL (ISOVUE-300) INJECTION 61% FINDINGS: Bones/Joint/Cartilage No left hip fracture or dislocation. Left hip joint space is relatively well maintained. Left total knee arthroplasty without hardware failure or complication. Beam hardening artifact resulting from the hardware partially obscuring the adjacent soft tissue osseous structures. Mildly comminuted, nondisplaced fracture of the distal femoral metaphysis transfixed with a intramedullary nail. Near anatomic alignment. No hardware failure or complication. Postsurgical changes in the surrounding soft tissues. No other fracture or dislocation. Small knee joint effusion. No aggressive lytic or sclerotic osseous lesion. Normal alignment. No joint effusion. Ligaments Ligaments are suboptimally evaluated by CT. Muscles and Tendons Soft tissue edema in the subcutaneous fat of the left thigh primarily along the lateral aspect  likely postsurgical. No fluid collection or hematoma. Quadriceps and patellar tendons are intact. Soft tissue No fluid collection or hematoma.  No soft tissue mass. IMPRESSION: 1. Interval ORIF of a distal left femoral metaphysis fracture in near anatomic alignment. Electronically Signed   By: Kathreen Devoid   On: 11/10/2016 08:12   Dg Chest Port 1 View  Result Date: 11/09/2016 CLINICAL DATA:  Leukocytosis EXAM: PORTABLE CHEST 1 VIEW COMPARISON:  10/28/2016 FINDINGS: Right shoulder replacement. Small bilateral effusions. Left greater than right lung base opacification. Cardiomegaly. No pneumothorax IMPRESSION: Small bilateral effusion with left greater than right bibasilar airspace disease which may be secondary to atelectasis or pneumonia Electronically Signed   By: Donavan Foil M.D.   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 Additionally, she has urosepsis. She presents for percutaneous nephrostomy tube placement. EXAM: IR NEPHROSTOMY PLACEMENT RIGHT COMPARISON:  CT scan abdomen/pelvis 11/09/2016 MEDICATIONS: Patient is currently an inpatient in receiving intravenous antibiotics. No additional antibiotic prophylaxis was administered. ANESTHESIA/SEDATION: 1 mg Versed CONTRAST:  15 mL Isovue-300 - administered into the collecting system(s) FLUOROSCOPY TIME:  Fluoroscopy Time: 3 minutes 30 seconds (49.5 mGy). COMPLICATIONS: None immediate. PROCEDURE: Informed written consent was obtained from the patient after a thorough discussion of the procedural risks, benefits and alternatives. All questions were addressed. Maximal Sterile Barrier Technique was utilized including caps, mask, sterile gowns, sterile gloves, sterile drape, hand hygiene and skin antiseptic. A timeout was performed prior to the  initiation of the procedure. The right flank was interrogated with ultrasound. The kidney is extremely abnormal in sonographic appearance. The kidney is diffusely heterogeneous with areas of hypoechoic, echogenic and intermediate echogenicity throughout. No definite hydronephrosis or dilated calices are identified. Local anesthesia was attained by infiltration with 1% lidocaine. Under real-time sonographic guidance, the kidney was punctured using a 21 gauge Accustick needle. A relatively hypoechoic portion of the kidney was targeted. The needle tip was easily visualized within the hypoechoic segment. Aspiration yields no material. A gentle injection of contrast through the needle demonstrates irregular nonspecific patchy staining. It is unclear if the needle tip is within a renal calyx filled with thick material/debris or if it is within the renal parenchyma itself. The CT scan was brought up and evaluated real-time while ultrasound Ing the kidney. There is no definitively normal kidney or collecting system visible. Under fluoroscopy, the Accustick needle was advanced into the same location as the double-J ureteral stent which should be in the renal pelvis. Again, urine could not be aspirated. Injection of contrast material demonstrates irregular pooling of contrast. Again, it is unclear if this is within the renal collecting system or diseased parenchyma. The 0.018 inch wire was carefully advanced through the needle. This appears formed within a space. The needle was exchanged over the wire for the Accustick dilator. Aspiration yields a small amount of thick, purulent fluid/debris. A 0.035 Amplatz wire was then advanced. The Accustick sheath was removed. The skin tract was dilated to 10 [place] and a Cook 10.2 [place] drainage catheter was advanced over the wire. The drainage catheter is only partially formed. Aspiration again yields PICC bloody purulent/ debris filled material. No definite urine is aspirated. The  catheter was connected to gravity bag drainage and secured to the skin with 0 Prolene suture. IMPRESSION: 1. Successful placement of a 10 French percutaneous nephrostomy tube into the complex left renal mass in the region of the double-J ureteral stent. Of note, it is unclear if this drainage catheter is within the renal collecting system, or within heavily diseased renal parenchyma. No normal collecting system or urine could be identified. The catheter is draining thick bloody purulent/necrotic material. PLAN: 1. Cultures were sent. 2. Monitor drain output. 3. Do not flush drain. 4. Consider hospice consultation. Electronically Signed   By: Jacqulynn Cadet M.D.   On: 11/10/2016 14:40      Assessment/Plan:  INTERVAL HISTORY:   IR have placed 72F tube into mass-like right kindey and drained thick purulent material   Principal Problem:   Sepsis due to Escherichia coli (E. coli) (HCC) Active Problems:   Hypertension   Physical deconditioning   Hypothyroidism   Chronic obstructive pulmonary disease ([place])   Type 2 diabetes mellitus with complication, without long-term current use of insulin (  [place])   Urothelial carcinoma of kidney, right (HCC)   Gastroesophageal reflux disease   History of recent fall   Right renal mass   Fracture of knee prosthesis (HCC)   AKI (acute kidney injury) ([place])   UTI due to extended-spectrum beta lactamase (ESBL) producing Escherichia coli   Traumatic closed displaced supracondylar fracture of left femur (HCC)   Acute renal failure (ARF) ([place])   Fall   Gram-negative bacteremia   Sepsis secondary to UTI ([place])   Transitional cell carcinoma (HCC)   Hypervolemia   HCAP (healthcare-associated pneumonia)   Leukocytosis   Encephalopathy acute   Hydronephrosis of right kidney   Abnormal CT scan, pelvis: 11/09/2016   Closed fracture of left distal femur (HCC)   Pleural effusion on right   Hypoalbuminemia    Isabel Collins is a 76 y.o. female with   transitional cell urothelial carcinoma, sp stent, complicated by ESBL in urine rx for UTI, scheduled to have nephrectomy but was unable to have this now admitted with fall, femur fracture likely due to hypotension from sepsis from ESBL in urine and blood. Her imaging shows progression of her malignancy + bleeding, pus, necrotic material on imaging. She has deteriorated despite appropriate antibiotics. Interventional radiology were able to place another drain in obtain purulent necrotic material.  --Continue meropenem --Follow-up cultures some drain --I think she is going to need surgical intervention to have hope of clearing this --as per my last note I am skeptical of need for azole but will  Leave it for now  Dr. Johnnye Sima to take back over the service tomorrow.   LOS: 13 days   Alcide Evener 11/11/2016, 12:29 PM

## 2016-11-11 NOTE — Progress Notes (Signed)
Chief Complaint: Patient was seen today for follow up (R)PCN  Referring Physician(s): Dr. Diona Fanti  Supervising Physician: Jacqulynn Cadet  Patient Status: [facility] - In-pt  Subjective: S/p complicated (R)PCN placement yesterday, see procedure note for details Pt resting in bed.  Objective: Physical Exam: BP 95/79   Pulse 86   Temp 97.6 F (36.4 C) (Axillary)   Resp 19   Ht 5\' 2"  (1.324 m)   Wt 193 lb (87.5 kg)   SpO2 95%   BMI 35.30 kg/m  (R)PCN intact, site clean, mildly tender as expected Output thin but dark bloody urine, output volume not documented.   Current Facility-Administered Medications:  .  0.45 % sodium chloride infusion, , Intravenous, Continuous, Aljishi, Virgina Norfolk, MD, Last Rate: 150 mL/hr at 11/10/16 2042 .  acetaminophen (TYLENOL) tablet 650 mg, 650 mg, Oral, Q4H PRN, Eugenie Filler, MD, 650 mg at 11/09/16 0903 .  albuterol (PROVENTIL) (2.5 MG/3ML) 0.083% nebulizer solution 2.5 mg, 2.5 mg, Nebulization, Q4H PRN, Cruzita Lederer, Costin M, MD .  atorvastatin (LIPITOR) tablet 40 mg, 40 mg, Oral, QHS, Caren Griffins, MD, 40 mg at 11/10/16 2328 .  bisacodyl (DULCOLAX) EC tablet 5 mg, 5 mg, Oral, QHS PRN, Caren Griffins, MD, 5 mg at 11/01/16 2251 .  cycloSPORINE (RESTASIS) 0.05 % ophthalmic emulsion 1 drop, 1 drop, Both Eyes, BID, Caren Griffins, MD, 1 drop at 11/11/16 0858 .  DULoxetine (CYMBALTA) DR capsule 30 mg, 30 mg, Oral, q1800, Caren Griffins, MD, 30 mg at 11/09/16 1655 .  DULoxetine (CYMBALTA) DR capsule 60 mg, 60 mg, Oral, Daily, Caren Griffins, MD, 60 mg at 11/11/16 0858 .  feeding supplement (ENSURE ENLIVE) (ENSURE ENLIVE) liquid 237 mL, 237 mL, Oral, BID BM, Velvet Bathe, MD, 237 mL at 11/11/16 0859 .  fluconazole (DIFLUCAN) tablet 100 mg, 100 mg, Oral, Daily, Caren Griffins, MD, 100 mg at 11/11/16 0859 .  fluticasone (FLONASE) 50 MCG/ACT nasal spray 2 spray, 2 spray, Each Nare, QHS PRN, Cruzita Lederer, Costin M, MD .  free water 250 mL,  250 mL, Oral, Q8H, Eugenie Filler, MD, 250 mL at 11/10/16 0945 .  insulin aspart (novoLOG) injection 0-9 Units, 0-9 Units, Subcutaneous, TID WC, Caren Griffins, MD, 1 Units at 11/07/16 1836 .  levothyroxine (SYNTHROID, LEVOTHROID) tablet 75 mcg, 75 mcg, Oral, QAC breakfast, Caren Griffins, MD, 75 mcg at 11/11/16 0801 .  meropenem (MERREM) 1 g in sodium chloride 0.9 % 100 mL IVPB, 1 g, Intravenous, Q12H, Poindexter, Leann T, RPH, Stopped at 11/11/16 0602 .  mometasone-formoterol (DULERA) 200-5 MCG/ACT inhaler 2 puff, 2 puff, Inhalation, BID, Caren Griffins, MD, 2 puff at 11/11/16 0800 .  pantoprazole (PROTONIX) EC tablet 40 mg, 40 mg, Oral, Daily, Caren Griffins, MD, 40 mg at 11/11/16 0858 .  senna-docusate (Senokot-S) tablet 1 tablet, 1 tablet, Oral, QHS PRN, Caren Griffins, MD, 1 tablet at 11/03/16 1048  Labs: CBC  Recent Labs  11/10/16 0211 11/11/16 0325  WBC 20.5* 15.3*  HGB 8.3* 8.0*  HCT 28.4* 27.2*  PLT 143* 146*   BMET  Recent Labs  11/10/16 0211 11/11/16 0325  NA 148* 144  K 5.1 4.5  CL 115* 109  CO2 20* 25  GLUCOSE 94 109*  BUN 42* 40*  CREATININE 1.55* 1.37*  CALCIUM 8.4* 8.0*   LFT  Recent Labs  11/08/16 1046 11/11/16 0325  PROT 5.1*  --   ALBUMIN 1.6* 2.2*  AST 62*  --  ALT 11*  --   ALKPHOS 104  --   BILITOT 0.7  --    PT/INR No results for input(s): LABPROT, INR in the last 72 hours.   Studies/Results: Ct Head Wo Contrast  Result Date: 11/09/2016 CLINICAL DATA:  [facility] at skilled nursing facility. History of transitional cell carcinoma. EXAM: CT HEAD WITHOUT CONTRAST TECHNIQUE: Contiguous axial images were obtained from the base of the skull through the vertex without intravenous contrast. COMPARISON:  CT HEAD March 28, 2016 FINDINGS: BRAIN: No intraparenchymal hemorrhage, mass effect nor midline shift. The ventricles and sulci are normal for age. Patchy supratentorial white matter hypodensities less than expected for patient's  age, though non-specific are most compatible with chronic small vessel ischemic disease. No acute large vascular territory infarcts. No abnormal extra-axial fluid collections. Basal cisterns are patent. VASCULAR: Mild normal calcific atherosclerosis of the carotid siphons. SKULL: No skull fracture. No significant scalp soft tissue swelling. SINUSES/ORBITS: Mild RIGHT maxillary sinus mucosal thickening without air-fluid levels. Mastoid air cells are well aerated. The included ocular globes and orbital contents are non-suspicious. OTHER: None. IMPRESSION: 1. Negative noncontrast CT HEAD for age. Electronically Signed   By: Elon Alas M.D.   On: 11/09/2016 23:00   Ct Abdomen Pelvis W Contrast  Result Date: 11/09/2016 CLINICAL DATA:  Abdominal pain and fever. Recent diagnosis of right upper pole transitional cell carcinoma. Recent biopsy and stent placement. EXAM: CT ABDOMEN AND PELVIS WITH CONTRAST TECHNIQUE: Multidetector CT imaging of the abdomen and pelvis was performed using the standard protocol following bolus administration of intravenous contrast. CONTRAST:  80 mL Isovue-300 COMPARISON:  09/17/2016 FINDINGS: Lower chest: Moderate bilateral pleural effusions with basilar atelectasis or consolidation. Calcified granuloma in the right lung base. Large esophageal hiatal hernia. Coronary artery and aortic valve calcifications. Hepatobiliary: No focal liver abnormality is seen. Status post cholecystectomy. No biliary dilatation. Pancreas: Unremarkable. No pancreatic ductal dilatation or surrounding inflammatory changes. Spleen: Normal in size without focal abnormality. Adrenals/Urinary Tract: No adrenal gland nodules. Since the previous study, there is interval development of a large fluid collection involving the right kidney consistent with massive hydronephrosis. This collection measures about 9.9 cm in diameter. There is a ureteral catheter present with the proximal pigtail within this dilated  structure. There is compression and thinning of the renal parenchyma. Renal parenchyma was normal in thickness on the prior study. There is interval development of a large amount of soft tissue density in the right renal fossa, extending to the paracaval retroperitoneum and peripancreatic regions as well a celiac axis. Soft tissue density extends along the right psoas muscle surrounding the ureteral stent down to the level of the pelvis. Soft tissue structure around the ureteral stent anterior to the psoas muscle measures about 4.9 x 7.8 cm in diameter. Enlarged lymph nodes are suggested in the retrocrural and retroperitoneal para-aortic regions. This is demonstrating significant progression since previous study. Differential diagnosis would include large hematomas versus rapid development and progression of lymph node metastasis. No contrast material is demonstrated in the right renal collecting system The left kidney and ureter are unremarkable. The bladder is decompressed. Distal catheter pigtail is present in the bladder. Bladder cannot be evaluated due to decompression. Small amount of gas suggested in the bladder is likely iatrogenic. Stomach/Bowel: Stomach, small bowel, and colon are not abnormally distended. Large amount of stool in the rectum. Scattered stool throughout the remainder the colon. Vascular/Lymphatic: Diffuse aortic and branch vessel calcifications. Extensive lymphadenopathy in the retroperitoneum, celiac axis, and pelvis. Reproductive: Status  post hysterectomy. No adnexal masses. Other: There is moderate free fluid in the abdomen and pelvis. No free air is identified. Prominent diffuse edema throughout the subcutaneous fat of the abdomen and pelvis. Abdominal wall musculature appears intact. Musculoskeletal: Degenerative changes in the spine. No destructive bone lesions. IMPRESSION: 1. A right ureteral catheter has been placed. There is massive dilatation of the right renal collecting system  with thinning of the right renal cortex. Large soft tissue mass lesions are demonstrated in the right renal fossa and extending along the course of the ureteral catheter. This likely represents metastatic disease or hematoma. A massively dilated ureter with hematoma could also potentially have this appearance. Prominent lymphadenopathy is suggested in the right pararenal spaces, retroperitoneum, celiac axis, and pelvis. Changes demonstrate significant progression since previous study, possibly indicating rapid progression of metastatic disease. Also could consider lymphoma. 2. Moderate free fluid in the abdomen and pelvis. 3. Diffuse soft tissue edema throughout the subcutaneous fat. 4. Moderate bilateral pleural effusions with basilar atelectasis or consolidation. Electronically Signed   By: Lucienne Capers M.D.   On: 11/09/2016 23:43   Ct Femur Left W Contrast  Result Date: 11/10/2016 CLINICAL DATA:  Left femur fracture status post fall 3 days ago. EXAM: CT OF THE LOWER RIGHT EXTREMITY WITH CONTRAST TECHNIQUE: Multidetector CT imaging of the lower right extremity was performed according to the standard protocol following intravenous contrast administration. COMPARISON:  None. CONTRAST:  164mL ISOVUE-300 IOPAMIDOL (ISOVUE-300) INJECTION 61% FINDINGS: Bones/Joint/Cartilage No left hip fracture or dislocation. Left hip joint space is relatively well maintained. Left total knee arthroplasty without hardware failure or complication. Beam hardening artifact resulting from the hardware partially obscuring the adjacent soft tissue osseous structures. Mildly comminuted, nondisplaced fracture of the distal femoral metaphysis transfixed with a intramedullary nail. Near anatomic alignment. No hardware failure or complication. Postsurgical changes in the surrounding soft tissues. No other fracture or dislocation. Small knee joint effusion. No aggressive lytic or sclerotic osseous lesion. Normal alignment. No joint  effusion. Ligaments Ligaments are suboptimally evaluated by CT. Muscles and Tendons Soft tissue edema in the subcutaneous fat of the left thigh primarily along the lateral aspect likely postsurgical. No fluid collection or hematoma. Quadriceps and patellar tendons are intact. Soft tissue No fluid collection or hematoma.  No soft tissue mass. IMPRESSION: 1. Interval ORIF of a distal left femoral metaphysis fracture in near anatomic alignment. Electronically Signed   By: Kathreen Devoid   On: 11/10/2016 08:12   Dg Chest Port 1 View  Result Date: 11/09/2016 CLINICAL DATA:  Leukocytosis EXAM: PORTABLE CHEST 1 VIEW COMPARISON:  10/24/2016 FINDINGS: Right shoulder replacement. Small bilateral effusions. Left greater than right lung base opacification. Cardiomegaly. No pneumothorax IMPRESSION: Small bilateral effusion with left greater than right bibasilar airspace disease which may be secondary to atelectasis or pneumonia Electronically Signed   By: Donavan Foil M.D.   On: 11/09/2016 20:33   Ir Nephrostomy Placement Right  Result Date: 11/10/2016 INDICATION: 76 year old female with advanced right-sided urothelial cancer and what appears to be severe right-sided hydronephrosis with unobstructed double-J ureteral stent. Additionally, she has urosepsis. She presents for percutaneous nephrostomy tube placement. EXAM: IR NEPHROSTOMY PLACEMENT RIGHT COMPARISON:  CT scan abdomen/pelvis 11/09/2016 MEDICATIONS: Patient is currently an inpatient in receiving intravenous antibiotics. No additional antibiotic prophylaxis was administered. ANESTHESIA/SEDATION: 1 mg Versed CONTRAST:  15 mL Isovue-300 - administered into the collecting system(s) FLUOROSCOPY TIME:  Fluoroscopy Time: 3 minutes 30 seconds (49.5 mGy). COMPLICATIONS: None immediate. PROCEDURE: Informed written consent was obtained  from the patient after a thorough discussion of the procedural risks, benefits and alternatives. All questions were addressed. Maximal Sterile  Barrier Technique was utilized including caps, mask, sterile gowns, sterile gloves, sterile drape, hand hygiene and skin antiseptic. A timeout was performed prior to the initiation of the procedure. The right flank was interrogated with ultrasound. The kidney is extremely abnormal in sonographic appearance. The kidney is diffusely heterogeneous with areas of hypoechoic, echogenic and intermediate echogenicity throughout. No definite hydronephrosis or dilated calices are identified. Local anesthesia was attained by infiltration with 1% lidocaine. Under real-time sonographic guidance, the kidney was punctured using a 21 gauge Accustick needle. A relatively hypoechoic portion of the kidney was targeted. The needle tip was easily visualized within the hypoechoic segment. Aspiration yields no material. A gentle injection of contrast through the needle demonstrates irregular nonspecific patchy staining. It is unclear if the needle tip is within a renal calyx filled with thick material/debris or if it is within the renal parenchyma itself. The CT scan was brought up and evaluated real-time while ultrasound Ing the kidney. There is no definitively normal kidney or collecting system visible. Under fluoroscopy, the Accustick needle was advanced into the same location as the double-J ureteral stent which should be in the renal pelvis. Again, urine could not be aspirated. Injection of contrast material demonstrates irregular pooling of contrast. Again, it is unclear if this is within the renal collecting system or diseased parenchyma. The 0.018 inch wire was carefully advanced through the needle. This appears formed within a space. The needle was exchanged over the wire for the Accustick dilator. Aspiration yields a small amount of thick, purulent fluid/debris. A 0.035 Amplatz wire was then advanced. The Accustick sheath was removed. The skin tract was dilated to 10 [place] and a Cook 10.2 [place] drainage catheter was advanced  over the wire. The drainage catheter is only partially formed. Aspiration again yields PICC bloody purulent/ debris filled material. No definite urine is aspirated. The catheter was connected to gravity bag drainage and secured to the skin with 0 Prolene suture. IMPRESSION: 1. Successful placement of a 10 French percutaneous nephrostomy tube into the complex left renal mass in the region of the double-J ureteral stent. Of note, it is unclear if this drainage catheter is within the renal collecting system, or within heavily diseased renal parenchyma. No normal collecting system or urine could be identified. The catheter is draining thick bloody purulent/necrotic material. PLAN: 1. Cultures were sent. 2. Monitor drain output. 3. Do not flush drain. 4. Consider hospice consultation. Electronically Signed   By: Jacqulynn Cadet M.D.   On: 11/10/2016 14:40    Assessment/Plan: (R)renal cancer with obstructive uropathy and hydro S/p (R)PCn yesterday, difficult placement and concern for positioning in collecting system, though WBC and Cr down since placement. Monitor output closely. DO NOT FLUSH. IR following    LOS: 13 days   I spent a total of 15 minutes in face to face in clinical consultation, greater than 50% of which was counseling/coordinating care for (R)PCN  Ascencion Dike PA-C 11/11/2016 10:44 AM

## 2016-11-12 ENCOUNTER — Inpatient Hospital Stay (HOSPITAL_COMMUNITY): Payer: Medicare Other

## 2016-11-12 DIAGNOSIS — N179 Acute kidney failure, unspecified: Secondary | ICD-10-CM

## 2016-11-12 DIAGNOSIS — E119 Type 2 diabetes mellitus without complications: Secondary | ICD-10-CM

## 2016-11-12 DIAGNOSIS — J9601 Acute respiratory failure with hypoxia: Secondary | ICD-10-CM

## 2016-11-12 DIAGNOSIS — R0603 Acute respiratory distress: Secondary | ICD-10-CM | POA: Diagnosis not present

## 2016-11-12 DIAGNOSIS — C641 Malignant neoplasm of right kidney, except renal pelvis: Secondary | ICD-10-CM

## 2016-11-12 LAB — GLUCOSE, CAPILLARY
GLUCOSE-CAPILLARY: 74 mg/dL (ref 65–99)
GLUCOSE-CAPILLARY: 118 mg/dL — AB (ref 65–99)
Glucose-Capillary: 66 mg/dL (ref 65–99)
Glucose-Capillary: 70 mg/dL (ref 65–99)
Glucose-Capillary: 81 mg/dL (ref 65–99)
Glucose-Capillary: 69 mg/dL (ref 65–99)
Glucose-Capillary: 106 mg/dL — ABNORMAL HIGH (ref 65–99)

## 2016-11-12 LAB — LACTIC ACID, PLASMA
LACTIC ACID, VENOUS: 2.3 mmol/L — AB (ref 0.5–1.9)
Lactic Acid, Venous: 2.2 mmol/L (ref 0.5–1.9)
Lactic Acid, Venous: 2.4 mmol/L (ref 0.5–1.9)

## 2016-11-12 LAB — CBC WITH DIFFERENTIAL/PLATELET
BASOS ABS: 0 10*3/uL (ref 0.0–0.1)
Basophils Relative: 0 %
EOS ABS: 0.3 10*3/uL (ref 0.0–0.7)
EOS PCT: 2 %
HCT: 25.7 % — ABNORMAL LOW (ref 36.0–46.0)
Hemoglobin: 7.5 g/dL — ABNORMAL LOW (ref 12.0–15.0)
LYMPHS ABS: 1.4 10*3/uL (ref 0.7–4.0)
LYMPHS PCT: 10 %
MCH: 25.3 pg — AB (ref 26.0–34.0)
MCHC: 29.2 g/dL — ABNORMAL LOW (ref 30.0–36.0)
MCV: 86.5 fL (ref 78.0–100.0)
MONO ABS: 0.9 10*3/uL (ref 0.1–1.0)
Monocytes Relative: 7 %
Neutro Abs: 10.8 10*3/uL — ABNORMAL HIGH (ref 1.7–7.7)
Neutrophils Relative %: 81 %
PLATELETS: 131 10*3/uL — AB (ref 150–400)
RBC: 2.97 MIL/uL — ABNORMAL LOW (ref 3.87–5.11)
RDW: 18.4 % — AB (ref 11.5–15.5)
WBC: 13.5 10*3/uL — ABNORMAL HIGH (ref 4.0–10.5)

## 2016-11-12 LAB — BLOOD GAS, ARTERIAL
ACID-BASE EXCESS: 0.7 mmol/L (ref 0.0–2.0)
BICARBONATE: 24.5 mmol/L (ref 20.0–28.0)
DRAWN BY: 275531
O2 CONTENT: 2 L/min
O2 SAT: 90.5 %
PATIENT TEMPERATURE: 98.6
pCO2 arterial: 37.5 mmHg (ref 32.0–48.0)
pH, Arterial: 7.431 (ref 7.350–7.450)
pO2, Arterial: 62.8 mmHg — ABNORMAL LOW (ref 83.0–108.0)

## 2016-11-12 LAB — PROCALCITONIN: PROCALCITONIN: 0.32 ng/mL

## 2016-11-12 LAB — HEPATITIS C ANTIBODY (REFLEX)

## 2016-11-12 LAB — BASIC METABOLIC PANEL
Anion gap: 8 (ref 5–15)
BUN: 37 mg/dL — AB (ref 6–20)
CO2: 25 mmol/L (ref 22–32)
CREATININE: 1.31 mg/dL — AB (ref 0.44–1.00)
Calcium: 8.1 mg/dL — ABNORMAL LOW (ref 8.9–10.3)
Chloride: 110 mmol/L (ref 101–111)
GFR calc Af Amer: 44 mL/min — ABNORMAL LOW (ref >60)
GFR, EST NON AFRICAN AMERICAN: 38 mL/min — AB (ref >60)
GLUCOSE: 71 mg/dL (ref 65–99)
POTASSIUM: 4.3 mmol/L (ref 3.5–5.1)
SODIUM: 143 mmol/L (ref 135–145)

## 2016-11-12 LAB — HCV COMMENT:

## 2016-11-12 LAB — MAGNESIUM: MAGNESIUM: 2.8 mg/dL — AB (ref 1.7–2.4)

## 2016-11-12 MED ORDER — BUDESONIDE 0.5 MG/2ML IN SUSP
0.5000 mg | Freq: Two times a day (BID) | RESPIRATORY_TRACT | Status: DC
  Administered 2016-11-12 – 2016-11-24 (×25): 0.5 mg via RESPIRATORY_TRACT
  Filled 2016-11-12 (×26): qty 2

## 2016-11-12 MED ORDER — FUROSEMIDE 10 MG/ML IJ SOLN
60.0000 mg | Freq: Once | INTRAMUSCULAR | Status: AC
  Administered 2016-11-12: 60 mg via INTRAVENOUS

## 2016-11-12 MED ORDER — DEXTROSE-NACL 5-0.45 % IV SOLN
INTRAVENOUS | Status: DC
  Administered 2016-11-12: 09:00:00 via INTRAVENOUS
  Filled 2016-11-12: qty 1000

## 2016-11-12 MED ORDER — SODIUM CHLORIDE 0.9 % IV SOLN
0.0000 ug/min | INTRAVENOUS | Status: DC
  Administered 2016-11-12: 5 ug/min via INTRAVENOUS
  Administered 2016-11-13 (×3): 30 ug/min via INTRAVENOUS
  Administered 2016-11-13: 35 ug/min via INTRAVENOUS
  Administered 2016-11-14: 50 ug/min via INTRAVENOUS
  Administered 2016-11-14: 75 ug/min via INTRAVENOUS
  Administered 2016-11-14: 35 ug/min via INTRAVENOUS
  Administered 2016-11-14: 75 ug/min via INTRAVENOUS
  Administered 2016-11-14: 35 ug/min via INTRAVENOUS
  Filled 2016-11-12: qty 1
  Filled 2016-11-12 (×2): qty 10
  Filled 2016-11-12 (×7): qty 1

## 2016-11-12 MED ORDER — ORAL CARE MOUTH RINSE
15.0000 mL | Freq: Two times a day (BID) | OROMUCOSAL | Status: DC
  Administered 2016-11-12 (×2): 15 mL via OROMUCOSAL

## 2016-11-12 MED ORDER — ALBUMIN HUMAN 25 % IV SOLN
25.0000 g | Freq: Once | INTRAVENOUS | Status: AC
  Administered 2016-11-12: 25 g via INTRAVENOUS
  Filled 2016-11-12: qty 100

## 2016-11-12 MED ORDER — GERHARDT'S BUTT CREAM
TOPICAL_CREAM | CUTANEOUS | Status: DC | PRN
  Administered 2016-11-13: 16:00:00 via TOPICAL
  Filled 2016-11-12: qty 1

## 2016-11-12 MED ORDER — CHLORHEXIDINE GLUCONATE 0.12% ORAL RINSE (MEDLINE KIT)
15.0000 mL | Freq: Two times a day (BID) | OROMUCOSAL | Status: DC
  Administered 2016-11-12 – 2016-11-24 (×22): 15 mL via OROMUCOSAL

## 2016-11-12 MED ORDER — IPRATROPIUM-ALBUTEROL 0.5-2.5 (3) MG/3ML IN SOLN
3.0000 mL | Freq: Four times a day (QID) | RESPIRATORY_TRACT | Status: DC
  Administered 2016-11-12 – 2016-11-20 (×34): 3 mL via RESPIRATORY_TRACT
  Filled 2016-11-12 (×34): qty 3

## 2016-11-12 MED ORDER — DEXTROSE-NACL 5-0.45 % IV SOLN
INTRAVENOUS | Status: DC
Start: 2016-11-12 — End: 2016-11-14
  Administered 2016-11-13: 14:00:00 via INTRAVENOUS
  Filled 2016-11-12 (×2): qty 1000

## 2016-11-12 MED ORDER — ORAL CARE MOUTH RINSE
15.0000 mL | Freq: Four times a day (QID) | OROMUCOSAL | Status: DC
  Administered 2016-11-12 – 2016-11-23 (×42): 15 mL via OROMUCOSAL

## 2016-11-12 MED ORDER — DEXTROSE 50 % IV SOLN
INTRAVENOUS | Status: AC
  Administered 2016-11-12: 50 mL
  Filled 2016-11-12: qty 50

## 2016-11-12 MED ORDER — FUROSEMIDE 10 MG/ML IJ SOLN
INTRAMUSCULAR | Status: AC
  Administered 2016-11-12: 60 mg via INTRAVENOUS
  Filled 2016-11-12: qty 8

## 2016-11-12 NOTE — Progress Notes (Signed)
eLink Physician-Brief Progress Note Patient Name: Isabel Collins DOB: 06/05/1940 MRN: [id number]   Date of Service  11/12/2016  HPI/Events of Note  Hypotensive -received albumin earlier today hypoglycemic  eICU Interventions  Increase D51/2 to 50/h Neo gtt     Intervention Category Major Interventions: Hypotension - evaluation and management  Marjie Chea V. 11/12/2016, 8:41 PM

## 2016-11-12 NOTE — Progress Notes (Signed)
SLP Cancellation Note  Patient Details Name: Isabel Collins MRN: [id number] DOB: 1940/07/24   Cancelled treatment:        MD on floor and said to hold off. Pt likely needing Bipap. MD calling critical care. Advised RN to defer po's for now. ST will follow.   Houston Siren 11/12/2016, 9:11 AM  Orbie Pyo Colvin Caroli.Ed [employer] [phone]

## 2016-11-12 NOTE — Progress Notes (Signed)
PULMONARY / CRITICAL CARE MEDICINE   Name: Isabel Collins MRN: [id number] DOB: 1940/07/23    ADMISSION DATE:  10/08/2016 CONSULTATION DATE:  11/10/2016 REFERRING MD:  Cline Cools  CHIEF COMPLAINT: sepsis  HISTORY OF PRESENT ILLNESS:   76 y.o.femalewith medical history of right renal mass revealing high-grade urothelial carcinoma s/p placement of  right ureteral stent on 08/27/2016 and was scheduled for nephrectomy on 8/30 but had a fall with left femur fracture requiring nailing on 9/4 now with ESBL Ecoli  bacteremia and UTI s/p right nephrostomy tube 11/10/2016. Course complicated by bacteremia, infected nephrostomy drainage.  Did not respond well to diuresis and BP marginal. Being followed by renal and ID. PCCM asked to see for hypotension and respiratory distress.   Subjective/Interval:  VITAL SIGNS: BP (!) 93/48 (BP Location: Right Arm)   Pulse (!) 104   Temp 97.8 F (36.6 C) (Oral)   Resp (!) 26   Ht 5\' 2"  (1.575 m)   Wt 90.3 kg (199 lb)   SpO2 98%   BMI 36.40 kg/m   HEMODYNAMICS:  stable off pressors  VENTILATOR SETTINGS:  on 1 litre [place]  INTAKE / OUTPUT: I/O last 3 completed shifts: In: 4758.8 [P.O.:100; I.V.:4358.8; IV Piggyback:300] Out: 1526 [Urine:1525; Stool:1]  PHYSICAL EXAMINATION: General: Chronically ill appearing female in mild resp distress Neuro:  Alert, follows commands, oriented to self.  HEENT: [place]/AT, PERRL, no appreciable JVD Cardiovascular:  Normal heart sounds no murmurs Lungs:  Crackles bilaterally, diminished bases Abdomen:  Soft no tenderness. R nephrostomy tube draining red/brown fluid.  Musculoskeletal:  3+ edema to feet, lower extremities wrapped with ACE. Toes cyanotic Skin:  No rash, grossly intact.   LABS:  BMET  Recent Labs Lab 11/10/16 0211 11/11/16 0325 11/12/16 0351  NA 148* 144 143  K 5.1 4.5 4.3  CL 115* 109 110  CO2 20* 25 25  BUN 42* 40* 37*  CREATININE 1.55* 1.37* 1.31*  GLUCOSE 94 109* 71     Electrolytes  Recent Labs Lab 11/08/16 1046  11/10/16 0211 11/11/16 0325 11/12/16 0351  CALCIUM 8.0*  < > 8.4* 8.0* 8.1*  MG 2.9*  --   --   --  2.8*  PHOS  --   --   --  3.7  --   < > = values in this interval not displayed.  CBC  Recent Labs Lab 11/10/16 0211 11/11/16 0325 11/12/16 0351  WBC 20.5* 15.3* 13.5*  HGB 8.3* 8.0* 7.5*  HCT 28.4* 27.2* 25.7*  PLT 143* 146* 131*    Coag's No results for input(s): APTT, INR in the last 168 hours.  Sepsis Markers  Recent Labs Lab 11/10/16 1921 11/11/16 0818 11/12/16 0820  LATICACIDVEN 3.1* 2.8* 2.2*    ABG  Recent Labs Lab 11/09/16 2105  PHART 7.456*  PCO2ART 33.7  PO2ART 119*    Liver Enzymes  Recent Labs Lab 11/07/16 0950 11/08/16 1046 11/11/16 0325  AST 69* 62*  --   ALT 12* 11*  --   ALKPHOS 94 104  --   BILITOT 0.4 0.7  --   ALBUMIN 1.4* 1.6* 2.2*    Cardiac Enzymes No results for input(s): TROPONINI, PROBNP in the last 168 hours.  Glucose  Recent Labs Lab 11/11/16 0801 11/11/16 1106 11/11/16 1632 11/11/16 2058 11/12/16 0739 11/12/16 0828  GLUCAP 73 81 73 78 66 70    Imaging No results found.     CULTURES: Blood 8/27 > E. Coli ESBL, MDRO Urine 8/27 > E. 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  - Bronchodilators change to nebs  Hypotension:  Volume depletion vs septic shock - MAP goal > 94mmHg - May need pressors for diuresis  Acute kidney injury, history of CKD II Right hydronephrosis secondary  to high grade urethral carcinoma - nephrostomy placed 9/8 Hypernatremia Urothelial carcinoma - nephrology following - IVF decreased today - was for nephrectomy, but delayed due to this hospitalization.   Severe sepsis secondary to ESBL Ecoli bacteremia and UTI - ID following - Continuing meropenem, diflucan off - Follow cultures  Metabolic encephalopathy  - Monitor closely   Severe malnutrition and hypoalbuminemia  - may need albumin for diuresis.  - Consider cortrak placement for TF  Fractured Left femur s/p fixation 9/4 - per ortho/primary  Georgann Housekeeper, AGACNP-BC [place] [facility] Pager [phone] or [phone]  11/12/2016 10:03 AM

## 2016-11-12 NOTE — Progress Notes (Signed)
Hypoglycemic Event  CBG: 66  Treatment: 15 GM carbohydrate snack  Symptoms: None  Follow-up CBG: LHTD:4287 CBG Result:70  Possible Reasons for Event: Inadequate meal intake  Comments/MD notified:Dr. Grandville Silos notified.     Isabel Collins

## 2016-11-12 NOTE — Progress Notes (Signed)
PROGRESS NOTE    Isabel Collins  BTD:[id number] DOB: 03-02-1941 DOA: 11/02/2016 PCP: [name], MD    Brief Narrative:  Patient is 76 year old female history of COPD, diabetes, hypertension, hyperlipidemia recent diagnosis of right upper pole transitional cell carcinoma was seen by urology with recent uteroscopic/biopsy and stent placement was supposed to have a nephrectomy 3 days prior to admission presented to the ED from [facility]F after fall with left knee injury. Patient with traumatic closed displaced supracondylar fracture of the left femur. Patient seen by orthopedics and underwent IM nailing 11/28/2016. Hospitalization complicated by bacteremia. Patient with significant anasarca/volume overload, patient with no significant improvement with encephalopathy, poor urine output despite diuresis. Patient was subsequently transferred to the stepdown unit. Placed on IV Lasix. CT abdomen and pelvis which was obtained was abnormal concerning for massive hydronephrosis and worsening soft tissue density. Urology, ID and nephrology consulted. Patient status post nephrostomy tube placement with necrotic thick purulent sludge noted cultures pending.   Assessment & Plan:   Principal Problem:   Acute respiratory distress Active Problems:   Sepsis due to Escherichia coli (E. coli) (HCC)   Hydronephrosis of right kidney   Hypertension   Physical deconditioning   Hypothyroidism   Chronic obstructive pulmonary disease (HCC)   Type 2 diabetes mellitus with complication, without long-term current use of insulin (HCC)   Urothelial carcinoma of kidney, right (HCC)   Gastroesophageal reflux disease   History of recent fall   Right renal mass   Fracture of knee prosthesis (HCC)   AKI (acute kidney injury) (HCC)   ESBL (extended spectrum beta-lactamase) producing bacteria infection   Closed comminuted supracondylar fracture of left femur (HCC)   Renal failure   Fall   Gram-negative bacteremia   Sepsis  secondary to UTI ([place])   Transitional cell carcinoma (HCC)   Hypervolemia   HCAP (healthcare-associated pneumonia)   Leukocytosis   Encephalopathy acute   Abnormal CT scan, pelvis: 11/09/2016   Closed fracture of left distal femur (HCC)   Pleural effusion on right   Hypoalbuminemia  #1 acute respiratory distress  patient in acute respiratory use of accessory muscles of respiration. Patient wheezing audibly. Patient with diffuse crackles noted on physical exam. Patient likely volume overloaded. Patient is +16.965 L during this hospitalization and significantly volume overloaded on examination. Will check a chest x-ray. Check ABG. Patient with borderline blood pressure. Give Lasix 60 mg IV 1. Strict I's and O's. Daily weights. Place on the BiPAP. Will reconsult with critical care medicine for further evaluation and management.  #2 closed displaced left supracondylar femur fracture secondary to mechanical fall. Patient status post IM nail 10/62/6948 with no complications. Patient with a worsening leukocytosis. Patient with encephalopathy. CT left femur negative for abscess formation. Per orthopedics.  #3 Escherichia coli bacteremia/Escherichia coli UTI Patient currently afebrile. Repeat blood cultures with no growth to date. Patient afebrile however with blood pressure that is borderline. Worsening leukocytosis. Continue IV meropenem. See #4. Due to worsening leukocytosis, abnormalities noted on CT scan with massive hydronephrosis and concern for rapid progression of metastatic disease. Patient status post nephrostomy tube placement with cultures pending. Leukocytosis trending down. ID following.  #4 constipation Felt likely secondary to pain regimen. CT abdomen and pelvis with large amount of stool in the rectum. Given a soapsuds enema.   #5 Sepsis presented on admission with multidrug resistant Escherichia coli/ multidrug resistant Escherichia coli UTI in the setting of right upper pole  transitional cell carcinoma pending nephrectomy -  blood  cultures consistent with Escherichia coli as well as urine cultures consistent with Escherichia coli. Escherichia coli multidrug resistant. Repeat blood cultures with no growth to date. Patient with a worsening leukocytosis. CT abdomen and pelvis with massive hydronephrosis on the right and large soft tissue mass lesions in the right renal fossa concerning for possible metastatic disease or hematoma. Prominent lymphadenopathy. Concern for rapid progression of metastatic disease. CT left femur negative for any abscess formation. Worsening leukocytosis could be secondary to abnormalities noted on CT scan. Patient status post right nephrostomy tube placement with sick, purulent/necrotic sludge noted which are pending for cultures. Leukocytosis trending down. Lactic acid level trending down. Continue IV Merrem. ID following and appreciate input and recommendations.   #6 acute metabolic encephalopathy Felt to be multifactorial secondary to acute sepsis and probable opioid pain medication. Patient drowsy and lethargic however easily arousable and answering questions appropriately. Ultram has been discontinued. CT head unremarkable. Ammonia level is within normal limits. Chest x-ray with small bilateral effusion left greater than right basilar airspace disease which may be atelectasis or pneumonia. Patient is not coughing. CT left femur negative for any acute abnormalities. CT abdomen and pelvis with a massive hydronephrosis of the right kidney, edema, large soft tissue mass lesions in the right renal fossa extending along the course of the ureter catheter. Status post right nephrostomy tube placement per interventional radiology 11/10/2016. Cultures pending. Continue empiric IV antibiotics. Urology following.   #7 massive right hydronephrosis/abnormal CT abdomen and pelvis Patient noted to have massive right hydronephrosis with abnormal CT abdomen and pelvis  with worsening soft tissue mass noted in the right renal fossa. Patient status post stent placement. Concern for blockage of stent. Patient seen in consultation by urology. Patient status post right nephrostomy tube placement per interventional radiology 11/10/2016. Cultures pending. Per urology.   #8 acute kidney injury on chronic kidney disease stage II Felt to be initially secondary to sepsis/acute infection. Now secondary to obstructive uropathy secondary to urothelial cancer a CT scan showing severe right hydronephrosis.  CT abdomen and pelvis done 11/09/2016 with a massive hydronephrosis of the right kidney, large soft tissue mass lesions demonstrated in the right renal fossa extending along the course of ureter catheter. Likely metastatic disease or hematoma. Massively dilated ureter with hematoma could also have this appearance. Prominent lymphadenopathy. Moderate free fluid in the abdomen and pelvis. Diffuse soft tissue edema throughout subcutaneous fat. Moderate bilateral pleural effusions with bilateral atelectasis or consolidation.   Patient noted to have poor urine output earlier on during the hospitalization. Patient third spacing with significant volume overload. It was felt patient likely intravascularly volume depleted. Patient seen by nephrology and patient placed on IV fluids with hypotonic solution. Urine output of 1.150 L on 11/10/2016. Patient now with poor urine output of 5 25 mL on 11/11/2016. Creatinine trending down.  Patient is status post right nephrostomy tube placement per interventional radiology. Output was purulent take necrotic sludge cultures pending. Patient on empiric IV antibiotics. Nephrology and urology following.  #9 hyperlipidemia Continue statin.  #77 metabolic acidosis/lactic acidosis Lactic acid level elevated likely secondary to acute septic infection. Patient noted to have a ESBL bacteremia and UTI currently on IV Merrem. Patient status post right  nephrostomy tube placement with thick, purulent/necrotic/noted with cultures pending. Lactic acidosis trending down. Continue IV Merrem. Acidosis improved with bicarbonate drip. Discontinued bicarbonate drip.   #11 hypertension/borderline hypotension Blood pressure borderline. Patient has been seen by critical care medicine and they increased patient's IV fluids. Will saline  lock IV fluids as patient significantly volume overloaded. May need pressors in order to diureses. Follow.  #12 borderline type 2 diabetes mellitus CBGs ranging from 64 -90. Patient with poor oral intake. Patient currently in respiratory distress and a such will have speech therapy reevaluate another day. Place on D5 half-normal saline at [facility]. Discontinue sliding scale insulin.   #13 right upper tract urothelial carcinoma Patient seems to have had significant progression of disease in the retroperitoneum and along the course of the ureter per CT abdomen and pelvis of 11/09/2016. Patient also noted to have a significant right hydronephrosis. Patient had a reassessed by urology who recommended percutaneous drainage of undrained fluid around the right kidney. Patient status post right nephrostomy tube placement per Dr. Laurence Ferrari interventional radiology 11/10/2016 with sick, purulent/necrotic sludge noted. Cultures pending. Patient with complaints of pain around nephrostomy tube site that is has only bloody drainage. Continue empiric IV antibiotics. Urology following.   #14 volume overload secondary to severe hypoalbuminemia Patient significantly volume overloaded on examination. Patient with significant lower extremity edema. Patient is + 16.965 L during this hospitalization. Patient noted to be somewhat intravascularly volume depleted and as such diuretics were discontinued and patient placed on IV fluids. IV fluid rate was increased by critical care. Patient now with a decreased urine output of 525 mL over the past 24  hours.. Patient received a dose of IV albumin 11/09/2016 and on 11/11/2016. Patient looks currently volume overloaded and in some respiratory distress and a such will kvo IV fluids. Nephrology following and appreciate input and recommendations.  #15 Hypernatremia Improved with free water boluses and IV fluids. Saline lock IV fluids as patient looks volume overloaded and a respiratory distress.    DVT prophylaxis: Lovenox Code Status: Full Family Communication: Updated patient. No family at bedside. Disposition Plan: Remain in the step down unit. Likely needs ICU.    Consultants:   Orthopedics: Dr. [name] 10/30/2016  Urology progress note Dr. [name] 10/30/2016  Nephrology : Dr. Florene Glen 11/10/2016  Infectious disease : Dr. Tommy Medal 11/10/2016  Urology: Dr.Dahlstedt 11/10/2016  Pccm: Dr Elwyn Reach 11/10/2016  Procedures:  CT chest 10/30/2016  Plain films of the pelvis 10/06/2016  Plain films of the left knee 10/11/2016  Renal ultrasound 10/14/2016  Intramedullary retrograde femoral nailing per Dr. [name] 11/04/2016  CT abdomen and pelvis 11/09/2016  CT head 11/09/2016  CT left femur 11/09/2016  Chest x-ray 11/09/2016  Albumin IV 1 11/09/2016, 11/11/2016  Right nephrostomy tube placement per interventional radiology Dr. Geroge Baseman 11/10/2016--- thick purulent necrotic sludge drained after nephrostomy tube placed.  Antimicrobials:   IV Ancef 10/30/2016>>>>> 11/07/2016  IV Merrem 10/05/2016>>>>  IV Zosyn a 27 [id number] dose.  IV vancomycin   Subjective: Patient laying in bed in some respiratory distress. Use of accessory muscles of respiration. Complaining of shortness of breath. No chest pain. Thoracoabdominal breathing. Patient noted to have a CBG of 66 this morning.  Objective: Vitals:   11/11/16 2327 11/12/16 0309 11/12/16 0739 11/12/16 0748  BP: (!) 104/49 (!) 96/47  (!) 93/48  Pulse: 99 (!) 109  (!) 104  Resp: 19 18  (!) 26  Temp: 97.8 F (36.6 C)  98.1 F (36.7 C)  97.8 F (36.6 C)  TempSrc: Axillary Axillary  Oral  SpO2: 96% 94% 95% 98%  Weight:  90.3 kg (199 lb)    Height:        Intake/Output Summary (Last 24 hours) at 11/12/16 0912 Last data filed at 11/12/16 0842  Gross per 24 hour  Intake          2576.25 ml  Output              525 ml  Net          2051.25 ml   Filed Weights   11/10/16 1400 11/11/16 0500 11/12/16 0309  Weight: 89.4 kg (197 lb) 87.5 kg (193 lb) 90.3 kg (199 lb)    Examination:  General exam: Alert. Use of accessory muscles of respiration.  Respiratory system: Diffuse crackles. Use of accessory muscles of respiration. Wheezing.   Cardiovascular system: Tachycardia. Positive JVD, murmurs, rubs, gallops or clicks. 3-4+ bilateral lower extremity edema up to lower abdomen. Gastrointestinal system: Abdomen is nondistended, soft and nontender. No organomegaly or masses felt. Normal bowel sounds heard. Right nephrostomy tube with bloody drainage. Central nervous system: Alert and some respiratory distress.  No focal deficits.  Extremities: Knee immobilizer on left lower extremity. Symmetric 5 x 5 power. Skin: No rashes, lesions or ulcers Psychiatry: Judgement and insight appear poor-fair. Mood & affect appropriate.     Data Reviewed: I have personally reviewed following labs and imaging studies  CBC:  Recent Labs Lab 11/08/16 1046 11/09/16 0936 11/10/16 0211 11/11/16 0325 11/12/16 0351  WBC 18.3* 21.8* 20.5* 15.3* 13.5*  NEUTROABS 15.6*  --  15.9* 12.8* 10.8*  HGB 9.1* 9.5* 8.3* 8.0* 7.5*  HCT 30.1* 32.2* 28.4* 27.2* 25.7*  MCV 86.2 86.1 87.1 86.3 86.5  PLT 128* 178 143* 146* 478*   Basic Metabolic Panel:  Recent Labs Lab 11/08/16 1046 11/09/16 0530 11/09/16 0936 11/10/16 0211 11/11/16 0325 11/12/16 0351  NA 147* 147*  --  148* 144 143  K 4.9 5.5* 5.0 5.1 4.5 4.3  CL 120* 116*  --  115* 109 110  CO2 17* 19*  --  20* 25 25  GLUCOSE 111* 82  --  94 109* 71  BUN 34* 38*  --  42*  40* 37*  CREATININE 1.44* 1.42*  --  1.55* 1.37* 1.31*  CALCIUM 8.0* 8.2*  --  8.4* 8.0* 8.1*  MG 2.9*  --   --   --   --  2.8*  PHOS  --   --   --   --  3.7  --    GFR: Estimated Creatinine Clearance: 37.6 mL/min (A) (by C-G formula based on SCr of 1.31 mg/dL (H)). Liver Function Tests:  Recent Labs Lab 11/07/16 0950 11/08/16 1046 11/11/16 0325  AST 69* 62*  --   ALT 12* 11*  --   ALKPHOS 94 104  --   BILITOT 0.4 0.7  --   PROT 5.1* 5.1*  --   ALBUMIN 1.4* 1.6* 2.2*   No results for input(s): LIPASE, AMYLASE in the last 168 hours.  Recent Labs Lab 11/09/16 1903  AMMONIA 15   Coagulation Profile: No results for input(s): INR, PROTIME in the last 168 hours. Cardiac Enzymes: No results for input(s): CKTOTAL, CKMB, CKMBINDEX, TROPONINI in the last 168 hours. BNP (last 3 results) No results for input(s): PROBNP in the last 8760 hours. HbA1C: No results for input(s): HGBA1C in the last 72 hours. CBG:  Recent Labs Lab 11/11/16 1106 11/11/16 1632 11/11/16 2058 11/12/16 0739 11/12/16 0828  GLUCAP 81 73 78 66 70   Lipid Profile: No results for input(s): CHOL, HDL, LDLCALC, TRIG, CHOLHDL, LDLDIRECT in the last 72 hours. Thyroid Function Tests: No results for input(s): TSH, T4TOTAL, FREET4, T3FREE, THYROIDAB in the last 72 hours. Anemia Panel: No results for  input(s): VITAMINB12, FOLATE, FERRITIN, TIBC, IRON, RETICCTPCT in the last 72 hours. Sepsis Labs:  Recent Labs Lab 11/09/16 1903 11/10/16 1921 11/11/16 0818  LATICACIDVEN 4.1* 3.1* 2.8*    Recent Results (from the past 240 hour(s))  Urine culture     Status: None   Collection Time: 11/07/16  8:10 AM  Result Value Ref Range Status   Specimen Description URINE, CLEAN CATCH  Final   Special Requests NONE  Final   Culture NO GROWTH  Final   Report Status 11/08/2016 FINAL  Final  Culture, blood (Routine X 2) w Reflex to ID Panel     Status: None (Preliminary result)   Collection Time: 11/09/16  7:02 PM   Result Value Ref Range Status   Specimen Description BLOOD RIGHT HAND  Final   Special Requests IN PEDIATRIC BOTTLE Blood Culture adequate volume  Final   Culture NO GROWTH 2 DAYS  Final   Report Status PENDING  Incomplete  Culture, blood (Routine X 2) w Reflex to ID Panel     Status: None (Preliminary result)   Collection Time: 11/09/16  7:20 PM  Result Value Ref Range Status   Specimen Description BLOOD LEFT HAND  Final   Special Requests   Final    BOTTLES DRAWN AEROBIC ONLY Blood Culture adequate volume   Culture NO GROWTH 2 DAYS  Final   Report Status PENDING  Incomplete  MRSA PCR Screening     Status: None   Collection Time: 11/10/16  2:01 AM  Result Value Ref Range Status   MRSA by PCR NEGATIVE NEGATIVE Final    Comment:        The GeneXpert MRSA Assay (FDA approved for NASAL specimens only), is one component of a comprehensive MRSA colonization surveillance program. It is not intended to diagnose MRSA infection nor to guide or monitor treatment for MRSA infections.   Urine Culture     Status: None   Collection Time: 11/10/16  9:41 AM  Result Value Ref Range Status   Specimen Description URINE, CATHETERIZED  Final   Special Requests NONE  Final   Culture NO GROWTH  Final   Report Status 11/11/2016 FINAL  Final  Body fluid culture     Status: None (Preliminary result)   Collection Time: 11/10/16  1:39 PM  Result Value Ref Range Status   Specimen Description FLUID KIDNEY RIGHT  Final   Special Requests NONE  Final   Gram Stain   Final    ABUNDANT WBC PRESENT, PREDOMINANTLY MONONUCLEAR MODERATE GRAM NEGATIVE RODS    Culture   Final    ABUNDANT ESCHERICHIA COLI REPEATING SENSITIVITIES    Report Status PENDING  Incomplete         Radiology Studies: Ir Nephrostomy Placement Right  Result Date: 11/10/2016 INDICATION: 76 year old female with advanced right-sided urothelial cancer and what appears to be severe right-sided hydronephrosis with unobstructed  double-J ureteral stent. Additionally, she has urosepsis. She presents for percutaneous nephrostomy tube placement. EXAM: IR NEPHROSTOMY PLACEMENT RIGHT COMPARISON:  CT scan abdomen/pelvis 11/09/2016 MEDICATIONS: Patient is currently an inpatient in receiving intravenous antibiotics. No additional antibiotic prophylaxis was administered. ANESTHESIA/SEDATION: 1 mg Versed CONTRAST:  15 mL Isovue-300 - administered into the collecting system(s) FLUOROSCOPY TIME:  Fluoroscopy Time: 3 minutes 30 seconds (49.5 mGy). COMPLICATIONS: None immediate. PROCEDURE: Informed written consent was obtained from the patient after a thorough discussion of the procedural risks, benefits and alternatives. All questions were addressed. Maximal Sterile Barrier Technique was utilized including caps, mask, sterile  gowns, sterile gloves, sterile drape, hand hygiene and skin antiseptic. A timeout was performed prior to the initiation of the procedure. The right flank was interrogated with ultrasound. The kidney is extremely abnormal in sonographic appearance. The kidney is diffusely heterogeneous with areas of hypoechoic, echogenic and intermediate echogenicity throughout. No definite hydronephrosis or dilated calices are identified. Local anesthesia was attained by infiltration with 1% lidocaine. Under real-time sonographic guidance, the kidney was punctured using a 21 gauge Accustick needle. A relatively hypoechoic portion of the kidney was targeted. The needle tip was easily visualized within the hypoechoic segment. Aspiration yields no material. A gentle injection of contrast through the needle demonstrates irregular nonspecific patchy staining. It is unclear if the needle tip is within a renal calyx filled with thick material/debris or if it is within the renal parenchyma itself. The CT scan was brought up and evaluated real-time while ultrasound Ing the kidney. There is no definitively normal kidney or collecting system visible. Under  fluoroscopy, the Accustick needle was advanced into the same location as the double-J ureteral stent which should be in the renal pelvis. Again, urine could not be aspirated. Injection of contrast material demonstrates irregular pooling of contrast. Again, it is unclear if this is within the renal collecting system or diseased parenchyma. The 0.018 inch wire was carefully advanced through the needle. This appears formed within a space. The needle was exchanged over the wire for the Accustick dilator. Aspiration yields a small amount of thick, purulent fluid/debris. A 0.035 Amplatz wire was then advanced. The Accustick sheath was removed. The skin tract was dilated to 10 [place] and a Cook 10.2 [place] drainage catheter was advanced over the wire. The drainage catheter is only partially formed. Aspiration again yields PICC bloody purulent/ debris filled material. No definite urine is aspirated. The catheter was connected to gravity bag drainage and secured to the skin with 0 Prolene suture. IMPRESSION: 1. Successful placement of a 10 French percutaneous nephrostomy tube into the complex left renal mass in the region of the double-J ureteral stent. Of note, it is unclear if this drainage catheter is within the renal collecting system, or within heavily diseased renal parenchyma. No normal collecting system or urine could be identified. The catheter is draining thick bloody purulent/necrotic material. PLAN: 1. Cultures were sent. 2. Monitor drain output. 3. Do not flush drain. 4. Consider hospice consultation. Electronically Signed   By: Jacqulynn Cadet M.D.   On: 11/10/2016 14:40        Scheduled Meds: . atorvastatin  40 mg Oral QHS  . cycloSPORINE  1 drop Both Eyes BID  . DULoxetine  30 mg Oral q1800  . DULoxetine  60 mg Oral Daily  . feeding supplement (ENSURE ENLIVE)  237 mL Oral BID BM  . fluconazole  100 mg Oral Daily  . free water  250 mL Oral Q8H  . furosemide      . furosemide  60 mg  Intravenous Once  . insulin aspart  0-9 Units Subcutaneous TID WC  . levothyroxine  75 mcg Oral QAC breakfast  . mouth rinse  15 mL Mouth Rinse BID  . mometasone-formoterol  2 puff Inhalation BID  . pantoprazole  40 mg Oral Daily   Continuous Infusions: . meropenem (MERREM) IV Stopped (11/12/16 0507)     LOS: 14 days    Time spent: 75 minutes    Edwena Mayorga, MD [facility] Pager [phone]  If 7PM-7AM, please contact night-coverage [email or web address] Password [email or web address] 11/12/2016, 9:12 AM

## 2016-11-12 NOTE — Progress Notes (Addendum)
INFECTIOUS DISEASE PROGRESS NOTE  ID: Isabel Collins is a 76 y.o. female with  Principal Problem:   Acute respiratory distress Active Problems:   Hypertension   Physical deconditioning   Hypothyroidism   Chronic obstructive pulmonary disease (HCC)   Type 2 diabetes mellitus with complication, without long-term current use of insulin (HCC)   Urothelial carcinoma of kidney, right (HCC)   Gastroesophageal reflux disease   History of recent fall   Right renal mass   Fracture of knee prosthesis (HCC)   AKI (acute kidney injury) ([place])   ESBL (extended spectrum beta-lactamase) producing bacteria infection   Closed comminuted supracondylar fracture of left femur (HCC)   Renal failure   Fall   Sepsis due to Escherichia coli (E. coli) (HCC)   Gram-negative bacteremia   Sepsis secondary to UTI ([place])   Transitional cell carcinoma (HCC)   Hypervolemia   HCAP (healthcare-associated pneumonia)   Leukocytosis   Encephalopathy acute   Hydronephrosis of right kidney   Abnormal CT scan, pelvis: 11/09/2016   Closed fracture of left distal femur (HCC)   Pleural effusion on right   Hypoalbuminemia  Subjective: In distress, tachyardic, tachypneic  Abtx:  Anti-infectives    Start     Dose/Rate Route Frequency Ordered Stop   11/07/16 0900  ceFAZolin (ANCEF) IVPB 2g/100 mL premix  Status:  Discontinued     2 g 200 mL/hr over 30 Minutes Intravenous To Short Stay 11/07/16 0809 11/07/16 0934   11/02/16 0700  ceFAZolin (ANCEF) IVPB 2g/100 mL premix     2 g 200 mL/hr over 30 Minutes Intravenous To Short Stay 10/30/16 1212 11/03/16 0700   11/02/16 0700  gentamicin (GARAMYCIN) 280 mg in dextrose 5 % 100 mL IVPB  Status:  Discontinued     5 mg/kg  56.2 kg (Adjusted) 107 mL/hr over 60 Minutes Intravenous 30 min pre-op 11/01/16 0932 11/02/16 1006   10/30/16 1000  fluconazole (DIFLUCAN) tablet 100 mg     100 mg Oral Daily 10/16/2016 1841     10/30/16 0600  ceFAZolin (ANCEF) IVPB 2g/100 mL premix   Status:  Discontinued     2 g 200 mL/hr over 30 Minutes Intravenous On call to O.R. 10/22/2016 1925 10/30/16 1212   10/30/16 0500  meropenem (MERREM) 1 g in sodium chloride 0.9 % 100 mL IVPB     1 g 200 mL/hr over 30 Minutes Intravenous Every 12 hours 10/10/2016 1632     10/23/2016 1630  meropenem (MERREM) 1 g in sodium chloride 0.9 % 100 mL IVPB     1 g 200 mL/hr over 30 Minutes Intravenous STAT 10/26/2016 1616 10/30/2016 1919   10/27/2016 1545  vancomycin (VANCOCIN) IVPB 1000 mg/200 mL premix     1,000 mg 200 mL/hr over 60 Minutes Intravenous  Once 10/14/2016 1543 10/25/2016 1727   10/19/2016 1545  piperacillin-tazobactam (ZOSYN) IVPB 3.375 g  Status:  Discontinued     3.375 g 100 mL/hr over 30 Minutes Intravenous  Once 10/13/2016 1543 10/03/2016 1546      Medications:  Scheduled: . atorvastatin  40 mg Oral QHS  . cycloSPORINE  1 drop Both Eyes BID  . DULoxetine  30 mg Oral q1800  . DULoxetine  60 mg Oral Daily  . feeding supplement (ENSURE ENLIVE)  237 mL Oral BID BM  . fluconazole  100 mg Oral Daily  . free water  250 mL Oral Q8H  . insulin aspart  0-9 Units Subcutaneous TID WC  . levothyroxine  75 mcg  Oral QAC breakfast  . mouth rinse  15 mL Mouth Rinse BID  . mometasone-formoterol  2 puff Inhalation BID  . pantoprazole  40 mg Oral Daily    Objective: Vital signs in last 24 hours: Temp:  [97.8 F (36.6 C)-99.4 F (37.4 C)] 97.8 F (36.6 C) (09/10 0748) Pulse Rate:  [97-109] 104 (09/10 0748) Resp:  [17-26] 26 (09/10 0748) BP: (93-111)/(47-62) 93/48 (09/10 0748) SpO2:  [91 %-98 %] 98 % (09/10 0748) Weight:  [90.3 kg (199 lb)] 90.3 kg (199 lb) (09/10 0309)   General appearance: severe distress Resp: rhonchi anterior - bilateral Cardio: tachycardia GI: normal findings: bowel sounds normal and soft, non-tender and drain on R side. dark, bloody fluid.  Extremities: edema 3+ L > R and dressed (not removed since surgery per nursing). foot is cool. no proximal tenderness or erythema.   Lab  Results  Recent Labs  11/11/16 0325 11/12/16 0351  WBC 15.3* 13.5*  HGB 8.0* 7.5*  HCT 27.2* 25.7*  NA 144 143  K 4.5 4.3  CL 109 110  CO2 25 25  BUN 40* 37*  CREATININE 1.37* 1.31*   Liver Panel  Recent Labs  11/11/16 0325  ALBUMIN 2.2*   Sedimentation Rate No results for input(s): ESRSEDRATE in the last 72 hours. C-Reactive Protein No results for input(s): CRP in the last 72 hours.  Microbiology: Recent Results (from the past 240 hour(s))  Urine culture     Status: None   Collection Time: 11/07/16  8:10 AM  Result Value Ref Range Status   Specimen Description URINE, CLEAN CATCH  Final   Special Requests NONE  Final   Culture NO GROWTH  Final   Report Status 11/08/2016 FINAL  Final  Culture, blood (Routine X 2) w Reflex to ID Panel     Status: None (Preliminary result)   Collection Time: 11/09/16  7:02 PM  Result Value Ref Range Status   Specimen Description BLOOD RIGHT HAND  Final   Special Requests IN PEDIATRIC BOTTLE Blood Culture adequate volume  Final   Culture NO GROWTH 2 DAYS  Final   Report Status PENDING  Incomplete  Culture, blood (Routine X 2) w Reflex to ID Panel     Status: None (Preliminary result)   Collection Time: 11/09/16  7:20 PM  Result Value Ref Range Status   Specimen Description BLOOD LEFT HAND  Final   Special Requests   Final    BOTTLES DRAWN AEROBIC ONLY Blood Culture adequate volume   Culture NO GROWTH 2 DAYS  Final   Report Status PENDING  Incomplete  MRSA PCR Screening     Status: None   Collection Time: 11/10/16  2:01 AM  Result Value Ref Range Status   MRSA by PCR NEGATIVE NEGATIVE Final    Comment:        The GeneXpert MRSA Assay (FDA approved for NASAL specimens only), is one component of a comprehensive MRSA colonization surveillance program. It is not intended to diagnose MRSA infection nor to guide or monitor treatment for MRSA infections.   Urine Culture     Status: None   Collection Time: 11/10/16  9:41 AM    Result Value Ref Range Status   Specimen Description URINE, CATHETERIZED  Final   Special Requests NONE  Final   Culture NO GROWTH  Final   Report Status 11/11/2016 FINAL  Final  Body fluid culture     Status: None (Preliminary result)   Collection Time: 11/10/16  1:39 PM  Result Value Ref Range Status   Specimen Description FLUID KIDNEY RIGHT  Final   Special Requests NONE  Final   Gram Stain   Final    ABUNDANT WBC PRESENT, PREDOMINANTLY MONONUCLEAR MODERATE GRAM NEGATIVE RODS    Culture   Final    ABUNDANT ESCHERICHIA COLI REPEATING SENSITIVITIES    Report Status PENDING  Incomplete    Studies/Results: Ir Nephrostomy Placement Right  Result Date: 11/10/2016 INDICATION: 76 year old female with advanced right-sided urothelial cancer and what appears to be severe right-sided hydronephrosis with unobstructed double-J ureteral stent. Additionally, she has urosepsis. She presents for percutaneous nephrostomy tube placement. EXAM: IR NEPHROSTOMY PLACEMENT RIGHT COMPARISON:  CT scan abdomen/pelvis 11/09/2016 MEDICATIONS: Patient is currently an inpatient in receiving intravenous antibiotics. No additional antibiotic prophylaxis was administered. ANESTHESIA/SEDATION: 1 mg Versed CONTRAST:  15 mL Isovue-300 - administered into the collecting system(s) FLUOROSCOPY TIME:  Fluoroscopy Time: 3 minutes 30 seconds (49.5 mGy). COMPLICATIONS: None immediate. PROCEDURE: Informed written consent was obtained from the patient after a thorough discussion of the procedural risks, benefits and alternatives. All questions were addressed. Maximal Sterile Barrier Technique was utilized including caps, mask, sterile gowns, sterile gloves, sterile drape, hand hygiene and skin antiseptic. A timeout was performed prior to the initiation of the procedure. The right flank was interrogated with ultrasound. The kidney is extremely abnormal in sonographic appearance. The kidney is diffusely heterogeneous with areas of  hypoechoic, echogenic and intermediate echogenicity throughout. No definite hydronephrosis or dilated calices are identified. Local anesthesia was attained by infiltration with 1% lidocaine. Under real-time sonographic guidance, the kidney was punctured using a 21 gauge Accustick needle. A relatively hypoechoic portion of the kidney was targeted. The needle tip was easily visualized within the hypoechoic segment. Aspiration yields no material. A gentle injection of contrast through the needle demonstrates irregular nonspecific patchy staining. It is unclear if the needle tip is within a renal calyx filled with thick material/debris or if it is within the renal parenchyma itself. The CT scan was brought up and evaluated real-time while ultrasound Ing the kidney. There is no definitively normal kidney or collecting system visible. Under fluoroscopy, the Accustick needle was advanced into the same location as the double-J ureteral stent which should be in the renal pelvis. Again, urine could not be aspirated. Injection of contrast material demonstrates irregular pooling of contrast. Again, it is unclear if this is within the renal collecting system or diseased parenchyma. The 0.018 inch wire was carefully advanced through the needle. This appears formed within a space. The needle was exchanged over the wire for the Accustick dilator. Aspiration yields a small amount of thick, purulent fluid/debris. A 0.035 Amplatz wire was then advanced. The Accustick sheath was removed. The skin tract was dilated to 10 [place] and a Cook 10.2 [place] drainage catheter was advanced over the wire. The drainage catheter is only partially formed. Aspiration again yields PICC bloody purulent/ debris filled material. No definite urine is aspirated. The catheter was connected to gravity bag drainage and secured to the skin with 0 Prolene suture. IMPRESSION: 1. Successful placement of a 10 French percutaneous nephrostomy tube into the complex  left renal mass in the region of the double-J ureteral stent. Of note, it is unclear if this drainage catheter is within the renal collecting system, or within heavily diseased renal parenchyma. No normal collecting system or urine could be identified. The catheter is draining thick bloody purulent/necrotic material. PLAN: 1. Cultures were sent. 2. Monitor drain output. 3. Do not flush  drain. 4. Consider hospice consultation. Electronically Signed   By: Jacqulynn Cadet M.D.   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  If she does not appear improving with diuresis (as tolerated, +17L), would consider broadening her anbx coverage Would also consider having her femur wound eval by ortho.   Not sure how helpful d-dimer would be in this situation, could consider DVT/PE in ddx         Bobby Rumpf Infectious Diseases (pager) [phone] [email or web address] 11/12/2016, 9:15 AM  LOS: 14 days

## 2016-11-12 NOTE — Progress Notes (Signed)
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    Subjective:    Sleepy but arousable this AM.  Pressures still soft   Objective:   BP (!) 93/48 (BP Location: Right Arm)   Pulse (!) 104   Temp 97.8 F (36.6 C) (Oral)   Resp (!) 26   Ht 5\' 2"  (1.575 m)   Wt 90.3 kg (199 lb)   SpO2 98%   BMI 36.40 kg/m   Intake/Output Summary (Last 24 hours) at 11/12/16 2025 Last data filed at 11/12/16 4270  Gross per 24 hour  Intake          2576.25 ml  Output              525 ml  Net          2051.25 ml   Weight change: 0.907 kg (2 lb)  Physical Exam: Gen: ill appearing CVS: tachycardic Resp:unlabored this AM Abd: NABS Ext: + anasarca  Imaging: Ir Nephrostomy Placement Right  Result Date: 11/10/2016 INDICATION: 76 year old female with advanced right-sided urothelial cancer and what appears to be severe right-sided hydronephrosis with unobstructed double-J ureteral stent. Additionally, she has urosepsis. She presents for percutaneous nephrostomy tube placement. EXAM: IR NEPHROSTOMY PLACEMENT RIGHT COMPARISON:  CT scan abdomen/pelvis 11/09/2016 MEDICATIONS: Patient is currently an inpatient in receiving intravenous antibiotics. No additional antibiotic prophylaxis was administered. ANESTHESIA/SEDATION: 1 mg Versed CONTRAST:  15 mL Isovue-300 - administered into the collecting system(s) FLUOROSCOPY TIME:  Fluoroscopy Time: 3 minutes 30 seconds (49.5 mGy). COMPLICATIONS: None immediate. PROCEDURE: Informed written consent was  obtained from the patient after a thorough discussion of the procedural risks, benefits and alternatives. All questions were addressed. Maximal Sterile Barrier Technique was utilized including caps, mask, sterile gowns, sterile gloves, sterile drape, hand hygiene and skin antiseptic. A timeout was performed prior to the initiation of the procedure. The right flank was interrogated with ultrasound. The kidney is extremely abnormal in sonographic appearance. The kidney is diffusely heterogeneous with areas of hypoechoic, echogenic and intermediate echogenicity throughout. No definite hydronephrosis or dilated calices are identified. Local anesthesia was attained by infiltration with 1% lidocaine. Under real-time sonographic guidance, the kidney was punctured using a 21 gauge Accustick needle. A relatively hypoechoic portion of the kidney was targeted. The needle tip was easily visualized within the hypoechoic segment. Aspiration yields no material. A gentle injection of contrast through the needle demonstrates irregular nonspecific patchy staining. It is unclear if the needle tip is within a renal calyx filled with thick material/debris or if it is within the renal parenchyma itself. The CT scan was brought up and evaluated real-time while ultrasound Ing the kidney. There is no definitively normal kidney or collecting system visible. Under fluoroscopy, the Accustick needle was advanced into the same location as the double-J ureteral stent which should be in the renal pelvis. Again, urine could not be aspirated. Injection of contrast material demonstrates irregular pooling of contrast. Again, it is unclear if this is within the renal collecting system or diseased parenchyma. The 0.018 inch wire was carefully advanced through the needle.  This appears formed within a space. The needle was exchanged over the wire for the Accustick dilator. Aspiration yields a small amount of thick, purulent fluid/debris. A 0.035 Amplatz  wire was then advanced. The Accustick sheath was removed. The skin tract was dilated to 10 [place] and a Cook 10.2 [place] drainage catheter was advanced over the wire. The drainage catheter is only partially formed. Aspiration again yields PICC bloody purulent/ debris filled material. No definite urine is aspirated. The catheter was connected to gravity bag drainage and secured to the skin with 0 Prolene suture. IMPRESSION: 1. Successful placement of a 10 French percutaneous nephrostomy tube into the complex left renal mass in the region of the double-J ureteral stent. Of note, it is unclear if this drainage catheter is within the renal collecting system, or within heavily diseased renal parenchyma. No normal collecting system or urine could be identified. The catheter is draining thick bloody purulent/necrotic material. PLAN: 1. Cultures were sent. 2. Monitor drain output. 3. Do not flush drain. 4. Consider hospice consultation. Electronically Signed   By: Jacqulynn Cadet M.D.   On: 11/10/2016 14:40    Labs: BMET  Recent Labs Lab 11/13/2016 0821 11/07/16 0950 11/08/16 1046 11/09/16 0530 11/09/16 0936 11/10/16 0211 11/11/16 0325 11/12/16 0351  NA 146* 146* 147* 147*  --  148* 144 143  K 4.4 4.8 4.9 5.5* 5.0 5.1 4.5 4.3  CL 122* 120* 120* 116*  --  115* 109 110  CO2 17* 14* 17* 19*  --  20* 25 25  GLUCOSE 139* 137* 111* 82  --  94 109* 71  BUN 25* 28* 34* 38*  --  42* 40* 37*  CREATININE 1.12* 1.25* 1.44* 1.42*  --  1.55* 1.37* 1.31*  CALCIUM 8.0* 7.9* 8.0* 8.2*  --  8.4* 8.0* 8.1*  PHOS  --   --   --   --   --   --  3.7  --    CBC  Recent Labs Lab 11/08/16 1046 11/09/16 0936 11/10/16 0211 11/11/16 0325 11/12/16 0351  WBC 18.3* 21.8* 20.5* 15.3* 13.5*  NEUTROABS 15.6*  --  15.9* 12.8* 10.8*  HGB 9.1* 9.5* 8.3* 8.0* 7.5*  HCT 30.1* 32.2* 28.4* 27.2* 25.7*  MCV 86.2 86.1 87.1 86.3 86.5  PLT 128* 178 143* 146* 131*    Medications:    . atorvastatin  40 mg Oral QHS  .  cycloSPORINE  1 drop Both Eyes BID  . DULoxetine  30 mg Oral q1800  . DULoxetine  60 mg Oral Daily  . feeding supplement (ENSURE ENLIVE)  237 mL Oral BID BM  . fluconazole  100 mg Oral Daily  . free water  250 mL Oral Q8H  . insulin aspart  0-9 Units Subcutaneous TID WC  . levothyroxine  75 mcg Oral QAC breakfast  . mouth rinse  15 mL Mouth Rinse BID  . mometasone-formoterol  2 puff Inhalation BID  . pantoprazole  40 mg Oral Daily      Madelon Lips, MD [facility] pgr [phone] 11/12/2016, 9:21 AM

## 2016-11-12 NOTE — Progress Notes (Signed)
CRITICAL VALUE ALERT  Critical Value:  Lactic Acid 2.4  Date & Time Notied:  11/12/2016 1216  Provider Notified: Dr. Ashok Cordia  Orders Received/Actions taken: none

## 2016-11-12 NOTE — Progress Notes (Signed)
Made contact with Dr. Elsworth Soho to report CBG of 52, pocketing of old pills found in mouth, sys BP consistently in 80's, change in pupil assessment from previous RN.  New orders obtained.    Richardean Canal RN, BSN, CCRN

## 2016-11-12 NOTE — Plan of Care (Signed)
Problem: Respiratory: Goal: Ability to maintain adequate ventilation will improve Outcome: Progressing Patient stable on BiPap

## 2016-11-12 NOTE — Progress Notes (Signed)
CRITICAL VALUE ALERT  Critical Value:  Lactic Acid 2.2  Date & Time Notied:  11/12/2016 0918  Provider Notified: Dr. Grandville Silos  Orders Received/Actions taken: none taken at this time

## 2016-11-12 NOTE — Progress Notes (Signed)
Transported pt from [facility] to [place] ICU on BIpap

## 2016-11-12 NOTE — Progress Notes (Signed)
CRITICAL VALUE ALERT  Critical Value:  Lactic Acid 2.3  Date & Time Notied: 11/12/2016 1915  Provider Notified: Dr. Elsworth Soho   Orders Received/Actions taken: Trending Lactic Acids last two values were 2.2 and 2.4

## 2016-11-13 LAB — RENAL FUNCTION PANEL
ALBUMIN: 2.8 g/dL — AB (ref 3.5–5.0)
ANION GAP: 10 (ref 5–15)
BUN: 39 mg/dL — ABNORMAL HIGH (ref 6–20)
CALCIUM: 8.2 mg/dL — AB (ref 8.9–10.3)
CO2: 24 mmol/L (ref 22–32)
Chloride: 108 mmol/L (ref 101–111)
Creatinine, Ser: 1.51 mg/dL — ABNORMAL HIGH (ref 0.44–1.00)
GFR, EST AFRICAN AMERICAN: 37 mL/min — AB (ref >60)
GFR, EST NON AFRICAN AMERICAN: 32 mL/min — AB (ref >60)
GLUCOSE: 90 mg/dL (ref 65–99)
PHOSPHORUS: 4.3 mg/dL (ref 2.5–4.6)
Potassium: 4.7 mmol/L (ref 3.5–5.1)
SODIUM: 142 mmol/L (ref 135–145)

## 2016-11-13 LAB — BODY FLUID CULTURE

## 2016-11-13 LAB — CBC WITH DIFFERENTIAL/PLATELET
BASOS ABS: 0 10*3/uL (ref 0.0–0.1)
BASOS PCT: 0 %
Band Neutrophils: 2 %
Blasts: 0 %
EOS ABS: 0.4 10*3/uL (ref 0.0–0.7)
Eosinophils Relative: 2 %
HEMATOCRIT: 28.9 % — AB (ref 36.0–46.0)
HEMOGLOBIN: 8.8 g/dL — AB (ref 12.0–15.0)
LYMPHS ABS: 1.7 10*3/uL (ref 0.7–4.0)
LYMPHS PCT: 9 %
MCH: 26.5 pg (ref 26.0–34.0)
MCHC: 30.4 g/dL (ref 30.0–36.0)
MCV: 87 fL (ref 78.0–100.0)
METAMYELOCYTES PCT: 0 %
MONO ABS: 1.1 10*3/uL — AB (ref 0.1–1.0)
MONOS PCT: 6 %
Myelocytes: 0 %
NEUTROS ABS: 15.7 10*3/uL — AB (ref 1.7–7.7)
Neutrophils Relative %: 81 %
OTHER: 0 %
Platelets: 162 10*3/uL (ref 150–400)
Promyelocytes Absolute: 0 %
RBC: 3.32 MIL/uL — ABNORMAL LOW (ref 3.87–5.11)
RDW: 19.4 % — ABNORMAL HIGH (ref 11.5–15.5)
WBC: 18.9 10*3/uL — ABNORMAL HIGH (ref 4.0–10.5)
nRBC: 0 /100 WBC

## 2016-11-13 LAB — GLUCOSE, CAPILLARY
GLUCOSE-CAPILLARY: 97 mg/dL (ref 65–99)
Glucose-Capillary: 96 mg/dL (ref 65–99)
Glucose-Capillary: 57 mg/dL — ABNORMAL LOW (ref 65–99)
Glucose-Capillary: 97 mg/dL (ref 65–99)
Glucose-Capillary: 95 mg/dL (ref 65–99)
Glucose-Capillary: 90 mg/dL (ref 65–99)
Glucose-Capillary: 83 mg/dL (ref 65–99)

## 2016-11-13 LAB — MAGNESIUM: Magnesium: 2.8 mg/dL — ABNORMAL HIGH (ref 1.7–2.4)

## 2016-11-13 LAB — PROCALCITONIN: PROCALCITONIN: 0.65 ng/mL

## 2016-11-13 MED ORDER — DEXTROSE 50 % IV SOLN
25.0000 mL | Freq: Once | INTRAVENOUS | Status: AC
  Administered 2016-11-13: 25 mL via INTRAVENOUS

## 2016-11-13 MED ORDER — SODIUM CHLORIDE 0.9% FLUSH
10.0000 mL | Freq: Two times a day (BID) | INTRAVENOUS | Status: DC
  Administered 2016-11-13 – 2016-11-18 (×11): 10 mL
  Administered 2016-11-19: 20 mL
  Administered 2016-11-19 – 2016-11-21 (×4): 10 mL
  Administered 2016-11-21: 20 mL
  Administered 2016-11-22 – 2016-11-24 (×3): 10 mL

## 2016-11-13 MED ORDER — ACETAMINOPHEN 325 MG PO TABS
650.0000 mg | ORAL_TABLET | Freq: Four times a day (QID) | ORAL | Status: DC | PRN
  Administered 2016-11-13 – 2016-11-22 (×9): 650 mg via ORAL
  Filled 2016-11-13 (×10): qty 2

## 2016-11-13 MED ORDER — DEXTROSE 50 % IV SOLN
INTRAVENOUS | Status: AC
  Administered 2016-11-13: 25 mL via INTRAVENOUS
  Filled 2016-11-13: qty 50

## 2016-11-13 MED ORDER — SODIUM CHLORIDE 0.9% FLUSH
10.0000 mL | INTRAVENOUS | Status: DC | PRN
  Administered 2016-11-25: 10 mL
  Filled 2016-11-13: qty 40

## 2016-11-13 MED ORDER — CHLORHEXIDINE GLUCONATE CLOTH 2 % EX PADS
6.0000 | MEDICATED_PAD | Freq: Every day | CUTANEOUS | Status: DC
  Administered 2016-11-13 – 2016-11-21 (×10): 6 via TOPICAL

## 2016-11-13 MED ORDER — ALBUMIN HUMAN 25 % IV SOLN
12.5000 g | Freq: Once | INTRAVENOUS | Status: AC
  Administered 2016-11-13: 12.5 g via INTRAVENOUS
  Filled 2016-11-13: qty 50

## 2016-11-13 MED ORDER — FUROSEMIDE 10 MG/ML IJ SOLN
60.0000 mg | Freq: Once | INTRAMUSCULAR | Status: AC
  Administered 2016-11-13: 60 mg via INTRAVENOUS
  Filled 2016-11-13: qty 6

## 2016-11-13 NOTE — Progress Notes (Signed)
PULMONARY / CRITICAL CARE MEDICINE   Name: Isabel Collins MRN: [id number] DOB: 05-Mar-1941    ADMISSION DATE:  10/03/2016   CONSULTATION DATE:  11/10/2016  REFERRING MD:  D. Grandville Silos, M.D. / [facility]  CHIEF COMPLAINT: Sepsis  HISTORY OF PRESENT ILLNESS:  76 y.o. 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 Patient underwent right ureteral stent placement on 6/25 and was subsequently scheduled for nephrectomy on 8/30 but had a fall with left femur fracture that was nailed on 9/4. Patient has developed an ESBL Escherichia coli infection with bacteremia. She did require nephrostomy tube placement on the right on 9/8. PCCM was asked to consult on 9/8 with tenuous medical status & multiple medical problems.  SUBJECTIVE:   No acute events overnight. Patient has required vasopressor infusion through peripheral IV. Respiratory status slowly improving. Remains off BiPAP.  REVIEW OF SYSTEMS:  She remains intermittently altered. Unable to obtain.  VITAL SIGNS: BP 103/68   Pulse (!) 102   Temp 99 F (37.2 C) (Oral)   Resp 17   Ht 5\' 2"  (1.575 m)   Wt 207 lb (93.9 kg)   SpO2 96%   BMI 37.86 kg/m   HEMODYNAMICS:    VENTILATOR SETTINGS: FiO2 (%):  [40 %] 40 %on 1 litre [place]  INTAKE / OUTPUT: I/O last 3 completed shifts: In: 1739.2 [I.V.:1639.2; IV Piggyback:100] Out: 4098 [Urine:1335]  PHYSICAL EXAMINATION: General:  Awake. Not alert. No acute distress. Integument:  Warm & dry. No rash on exposed skin.  HEENT: Dry mucous membranes. No scleral icterus. Cardiovascular:  Tachycardic with regular rhythm. Sinus rhythm on telemetry. Anasarca. Pulmonary:  Diminished breath sounds bilateral lung bases. Normal work of breathing on nasal cannula oxygen. Abdomen: Soft. Normal bowel sounds. Nondistended.  Neurological: Spontaneously moving all 4 extremities. Following commands intermittently. Not oriented.  LABS:  BMET  Recent Labs Lab 11/11/16 0325  11/12/16 0351 11/13/16 0331  NA 144 143 142  K 4.5 4.3 4.7  CL 109 110 108  CO2 25 25 24   BUN 40* 37* 39*  CREATININE 1.37* 1.31* 1.51*  GLUCOSE 109* 71 90    Electrolytes  Recent Labs Lab 11/08/16 1046  11/11/16 0325 11/12/16 0351 11/13/16 0331  CALCIUM 8.0*  < > 8.0* 8.1* 8.2*  MG 2.9*  --   --  2.8* 2.8*  PHOS  --   --  3.7  --  4.3  < > = values in this interval not displayed.  CBC  Recent Labs Lab 11/11/16 0325 11/12/16 0351 11/13/16 0642  WBC 15.3* 13.5* 18.9*  HGB 8.0* 7.5* 8.8*  HCT 27.2* 25.7* 28.9*  PLT 146* 131* 162    Coag's No results for input(s): APTT, INR in the last 168 hours.  Sepsis Markers  Recent Labs Lab 11/12/16 0820 11/12/16 1108 11/12/16 1758 11/13/16 0331  LATICACIDVEN 2.2* 2.4* 2.3*  --   PROCALCITON  --  0.32  --  0.65    ABG  Recent Labs Lab 11/09/16 2105 11/12/16 0945  PHART 7.456* 7.431  PCO2ART 33.7 37.5  PO2ART 119* 62.8*    Liver Enzymes  Recent Labs Lab 11/07/16 0950 11/08/16 1046 11/11/16 0325 11/13/16 0331  AST 69* 62*  --   --   ALT 12* 11*  --   --   ALKPHOS 94 104  --   --   BILITOT 0.4 0.7  --   --   ALBUMIN 1.4* 1.6* 2.2* 2.8*    Cardiac Enzymes No results for  input(s): TROPONINI, PROBNP in the last 168 hours.  Glucose  Recent Labs Lab 11/12/16 2112 11/12/16 2327 11/13/16 0333 11/13/16 0826 11/13/16 0853 11/13/16 1150  GLUCAP 118* 106* 96 57* 97 95    Imaging No results found.  IMAGING/STUDIES: PORT CXR 9/10:  Previously reviewed by me. Worsening bilateral lung opacification compared with earlier chest x-ray imaging.  MICROBIOLOGY: Blood Cultures x2 8/12:  Negative Blood Cultures x2 8/27:  1/2 Positive ESBL E coli  Urine Culture 8/27:  ESBL E coli  MRSA PCR 8/27:  Negative  Blood Cultures x2 8/29:  Negative  Urine Culture 9/5: Negative  Blood Cultures x2 9/7 >>> MRSA PCR 9/8:  Negative  Body Fluid Culture 9/8:  E coli   ANTIBIOTICS: Merrem 8/27  >>>  SIGNIFICANT EVENTS: 08/27 - Admit 09/08 - Right perc nephrostomy tube placement 09/10 - Transferred to ICU with worsening respiratory status  ASSESSMENT / PLAN:  76 y.o. female with bacteremia from ESBL E coli with bacteremia. Patient's respiratory status has stabilized but she a vasopressor requirement today. Patient continues to have altered mentation.  1. Acute hypoxic respiratory failure: Continuing diuresis with albumin followed by Lasix. Continuing to wean FiO2. Continuous pulse oximetry monitoring. 2. Sepsis: Secondary to bacteremia. Awaiting finalization of repeat blood cultures. 3. Escherichia coli bacteremia: Continuing meropenem. Awaiting finalization of blood cultures. Checking transthoracic echocardiogram. 4. Shock: Suspect secondary to sepsis and intravascular volume depletion. Checking transthoracic echocardiogram. Continuing vasopressor support. 5. Acute encephalopathy: Slowly improving. Likely toxic metabolic. Limiting sedation and holding Cymbalta. 6. Obstructive uropathy: Secondary to urothelial carcinoma. Status post percutaneous nephrostomy tube. 7. Lactic acidosis: Mild. Likely multifactorial and from sepsis. 8. Anemia/thrombocytopenia: Hemoglobin stable. No signs of active bleeding. From cytopenias resolved. 9. Left hip fracture: Status post surgery by orthopedics.  10. Chronic renal failure: Maintaining mean arterial pressure. Not a dialysis candidate per nephrology today. Continuing to monitor electrolytes and renal function closely with diuresis as well as urine output with Foley catheter.  Prophylaxis:  SCDs.  Diet:  Speech consult pending before restarting diet. Code Status:  Full Code per previous physician discussions. Disposition:  Remains critically ill in the ICU. Family Update:  No family at bedside during rounds. Patient updated during rounds.  I have personally spent a total of 33 minutes of critical care time today caring for the patient &  reviewing the patient's electronic medical record.  Sonia Baller Ashok Cordia, M.D. [facility] Pager:  [phone] After 3pm or if no response, call [phone] 11/13/2016 12:08 PM

## 2016-11-13 NOTE — Care Management Note (Addendum)
Case Management Note  Patient Details  Name: Isabel Collins MRN: [id number] Date of Birth: August 05, 1940  Subjective/Objective:       From [facility], presents with transitional cell ca of r kidney, obstructive uropathy, ESBL [place] uti, Femur Fx with L IM nail on 9/4, had hypotension , receiving albumin, cr is worse today, and wbc is worse today.   CSW referral.    [phone] Tomi Bamberger RN, BSN- s/p IM nail 9/4, nephrostomy tube 9/8, ESBL UTI, Bacteremia, worsening renal function, urethelial cancer, AMS, on bipap, worsening wbc.  She is from [facility], [facility] following.  She has no family members to help make decisions per MD note. conts on iv abx. Solucortef, neo.          Action/Plan: NCM will follow with CSW for dc needs.   Expected Discharge Date:   (unknown)               Expected Discharge Plan:  Skilled Nursing Facility  In-House Referral:  Clinical Social Work  Discharge planning Services  CM Consult  Post Acute Care Choice:    Choice offered to:     DME Arranged:    DME Agency:     HH Arranged:    [place] Agency:     Status of Service:  Completed, signed off  If discussed at [employer], dates discussed:    Additional Comments:  Zenon Mayo, RN 11/13/2016, 1:06 PM

## 2016-11-13 NOTE — Progress Notes (Signed)
  Speech Language Pathology Treatment: Dysphagia  Patient Details Name: Isabel Collins MRN: [id number] DOB: 1941/02/15 Today's Date: 11/13/2016 Time: 7001-7494 SLP Time Calculation (min) (ACUTE ONLY): 10 min  Assessment / Plan / Recommendation Clinical Impression  Pt was seen for skilled ST targeting dysphagia goals.  New orders received from MD to resume POs.  Discussed with RN as well.  Pt consumed dys 1 textures and thin liquids with mod-max verbal cues for attention to boluses to clear residual purees from the oral cavity.  No overt s/s of aspiration were evident with thin liquids or purees.  Recommend that pt resume dys 1 diet with thin liquids and full supervision for use of swallowing precautions.  Hold POs if pt becomes lethargic or with increased work of breathing.  Pt was left in bed with bed alarm set and call bell within reach.    HPI HPI: Pt is a 76 yo female admitted s/p fall with a traumatic closed displaced supracondylar fracture of left femur, s/p IM nail 9/5.  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, HH, GERD      SLP Plan  Continue with current plan of care       Recommendations  Diet recommendations: Dysphagia 1 (puree);Thin liquid Liquids provided via: Cup;Straw Medication Administration: Crushed with puree Supervision: Full supervision/cueing for compensatory strategies;Staff to assist with self feeding Compensations: Slow rate;Small sips/bites;Lingual sweep for clearance of pocketing Postural Changes and/or Swallow Maneuvers: Seated upright 90 degrees;Upright 30-60 min after meal                Oral Care Recommendations: Oral care QID Follow up Recommendations: Skilled Nursing facility;24 hour supervision/assistance SLP Visit Diagnosis: Dysphagia, oral phase (R13.11) Plan: Continue with current plan of care       GO                 PageSelinda Orion 11/13/2016, 4:24 PM

## 2016-11-13 NOTE — Progress Notes (Signed)
Referring Physician(s): CCM  Supervising Physician: Corrie Mckusick  Patient Status:  Isabel Collins - In-pt  Chief Complaint:  Rt PCN placed 9/8  Subjective:  Renal Ca Obstructive uropathy PCN placed in IR Minimal drainage Cr 1.51 today Nephrology has consulted pt Known to Urology  Allergies: Patient has no known allergies.  Medications: Prior to Admission medications   Medication Sig Start Date End Date Taking? Authorizing Provider  ADVAIR DISKUS 250-50 MCG/DOSE AEPB Inhale 1 puff into the lungs 2 (two) times daily as needed (shortness of breath).  06/06/16  Yes [provider]  albuterol (PROVENTIL HFA;VENTOLIN HFA) 108 (90 Base) MCG/ACT inhaler Inhale 1-2 puffs into the lungs every 6 (six) hours as needed for wheezing. 05/20/16  Yes Larene Pickett, PA-C  atorvastatin (LIPITOR) 40 MG tablet Take 1 tablet (40 mg total) by mouth every evening. Patient taking differently: Take 40 mg by mouth at bedtime.  09/08/16  Yes [name], MD  bisacodyl (DULCOLAX) 5 MG EC tablet Take 5 mg by mouth at bedtime as needed for moderate constipation.    Yes [provider]  Cholecalciferol (VITAMIN D3) 400 units CAPS Take 400 Units by mouth daily.   Yes [provider]  cycloSPORINE (RESTASIS) 0.05 % ophthalmic emulsion Place 1 drop into both eyes 2 (two) times daily. 09/19/16  Yes [name], MD  DULoxetine (CYMBALTA) 30 MG capsule Take 1 capsule (30 mg total) by mouth 2 (two) times daily. Take two capsules po in the morning and one capsule po in the afternoon. 10/16/16  Yes Domenic Polite, MD  fluconazole (DIFLUCAN) 100 MG tablet Take 100 mg by mouth daily.   Yes [provider]  fluticasone (FLONASE) 50 MCG/ACT nasal spray Place 2 sprays into both nostrils at bedtime as needed for allergies or rhinitis. 09/19/16  Yes [name], MD  HYDROcodone-acetaminophen (NORCO/VICODIN) 5-325 MG tablet Take 1 tablet by mouth every 6 (six) hours as needed for moderate pain.  10/16/16  Yes Domenic Polite, MD  levothyroxine (SYNTHROID, LEVOTHROID) 75 MCG tablet Take 1 tablet (75 mcg total) by mouth daily before breakfast. 09/08/16  Yes [name], MD  metoprolol succinate (TOPROL-XL) 100 MG 24 hr tablet TAKE 1 TABLET BY MOUTH  DAILY WITH OR IMMEDIATLEY  FOLLOWING A MEAL 10/28/16  Yes [name], MD  NEXIUM 40 MG capsule Take 1 capsule (40 mg total) by mouth daily. 10/08/16  Yes Sagardia, Ines Bloomer, MD  nitrofurantoin (MACRODANTIN) 50 MG capsule Take 50 mg by mouth at bedtime.   Yes [provider]  oxycodone (OXY-IR) 5 MG capsule Take 5 mg by mouth every 4 (four) hours as needed for pain.   Yes [provider]  polyethylene glycol (MIRALAX / GLYCOLAX) packet Take 17 g by mouth daily as needed for mild constipation. 10/16/16  Yes Domenic Polite, MD  pregabalin (LYRICA) 150 MG capsule Take 1 capsule (150 mg total) by mouth 2 (two) times daily. 09/08/16  Yes [name], MD  Tetrahydroz-Dextran-PEG-Povid [facility] ADVANCED RELIEF) 0.05-0.1-1-1 % SOLN Apply 1 drop to eye every 4 (four) hours.   Yes [provider]  enoxaparin (LOVENOX) 40 MG/0.4ML injection Inject 0.4 mLs (40 mg total) into the skin daily. For 30 days post op for DVT prophylaxis 11/09/2016 12/06/16  Prudencio Burly III, PA-C  nitrofurantoin, macrocrystal-monohydrate, (MACROBID) 100 MG capsule Take 1 capsule (100 mg total) by mouth 2 (two) times daily. For 2 weeks, until Kidney surgery Patient not taking: Reported on 10/27/2016  10/16/16   Domenic Polite, MD  senna-docusate (SENOKOT-S) 8.6-50 MG tablet Take 1 tablet by mouth 2 (two) times daily. Patient not taking: Reported on 10/26/2016 10/16/16   Domenic Polite, MD     Vital Signs: BP 105/72   Pulse (!) 102   Temp 98.3 F (36.8 C) (Oral)   Resp (!) 25   Ht 5\' 2"  (1.575 m)   Wt 207 lb (93.9 kg)   SpO2 95%   BMI 37.86 kg/m   Physical Exam  Neurological:  confused  Skin: Skin is warm.  Site of PCN is clean and dry NT no  bleeding OP dark color urine; ?blood tinged Minimal OP of PCN per chart UOP 1 L yesterday   Nursing note and vitals reviewed.   Imaging: Ct Head Wo Contrast  Result Date: 11/09/2016 CLINICAL DATA:  [place] at skilled nursing facility. History of transitional cell carcinoma. EXAM: CT HEAD WITHOUT CONTRAST TECHNIQUE: Contiguous axial images were obtained from the base of the skull through the vertex without intravenous contrast. COMPARISON:  CT HEAD March 28, 2016 FINDINGS: BRAIN: No intraparenchymal hemorrhage, mass effect nor midline shift. The ventricles and sulci are normal for age. Patchy supratentorial white matter hypodensities less than expected for patient's age, though non-specific are most compatible with chronic small vessel ischemic disease. No acute large vascular territory infarcts. No abnormal extra-axial fluid collections. Basal cisterns are patent. VASCULAR: Mild normal calcific atherosclerosis of the carotid siphons. SKULL: No skull fracture. No significant scalp soft tissue swelling. SINUSES/ORBITS: Mild RIGHT maxillary sinus mucosal thickening without air-fluid levels. Mastoid air cells are well aerated. The included ocular globes and orbital contents are non-suspicious. OTHER: None. IMPRESSION: 1. Negative noncontrast CT HEAD for age. Electronically Signed   By: Elon Alas M.D.   On: 11/09/2016 23:00   Ct Abdomen Pelvis W Contrast  Result Date: 11/09/2016 CLINICAL DATA:  Abdominal pain and fever. Recent diagnosis of right upper pole transitional cell carcinoma. Recent biopsy and stent placement. EXAM: CT ABDOMEN AND PELVIS WITH CONTRAST TECHNIQUE: Multidetector CT imaging of the abdomen and pelvis was performed using the standard protocol following bolus administration of intravenous contrast. CONTRAST:  80 mL Isovue-300 COMPARISON:  09/17/2016 FINDINGS: Lower chest: Moderate bilateral pleural effusions with basilar atelectasis or consolidation. Calcified granuloma in the right  lung base. Large esophageal hiatal hernia. Coronary artery and aortic valve calcifications. Hepatobiliary: No focal liver abnormality is seen. Status post cholecystectomy. No biliary dilatation. Pancreas: Unremarkable. No pancreatic ductal dilatation or surrounding inflammatory changes. Spleen: Normal in size without focal abnormality. Adrenals/Urinary Tract: No adrenal gland nodules. Since the previous study, there is interval development of a large fluid collection involving the right kidney consistent with massive hydronephrosis. This collection measures about 9.9 cm in diameter. There is a ureteral catheter present with the proximal pigtail within this dilated structure. There is compression and thinning of the renal parenchyma. Renal parenchyma was normal in thickness on the prior study. There is interval development of a large amount of soft tissue density in the right renal fossa, extending to the paracaval retroperitoneum and peripancreatic regions as well a celiac axis. Soft tissue density extends along the right psoas muscle surrounding the ureteral stent down to the level of the pelvis. Soft tissue structure around the ureteral stent anterior to the psoas muscle measures about 4.9 x 7.8 cm in diameter. Enlarged lymph nodes are suggested in the retrocrural and retroperitoneal para-aortic regions. This is demonstrating significant progression since previous study. Differential diagnosis would include large hematomas versus  rapid development and progression of lymph node metastasis. No contrast material is demonstrated in the right renal collecting system The left kidney and ureter are unremarkable. The bladder is decompressed. Distal catheter pigtail is present in the bladder. Bladder cannot be evaluated due to decompression. Small amount of gas suggested in the bladder is likely iatrogenic. Stomach/Bowel: Stomach, small bowel, and colon are not abnormally distended. Large amount of stool in the rectum.  Scattered stool throughout the remainder the colon. Vascular/Lymphatic: Diffuse aortic and branch vessel calcifications. Extensive lymphadenopathy in the retroperitoneum, celiac axis, and pelvis. Reproductive: Status post hysterectomy. No adnexal masses. Other: There is moderate free fluid in the abdomen and pelvis. No free air is identified. Prominent diffuse edema throughout the subcutaneous fat of the abdomen and pelvis. Abdominal wall musculature appears intact. Musculoskeletal: Degenerative changes in the spine. No destructive bone lesions. IMPRESSION: 1. A right ureteral catheter has been placed. There is massive dilatation of the right renal collecting system with thinning of the right renal cortex. Large soft tissue mass lesions are demonstrated in the right renal fossa and extending along the course of the ureteral catheter. This likely represents metastatic disease or hematoma. A massively dilated ureter with hematoma could also potentially have this appearance. Prominent lymphadenopathy is suggested in the right pararenal spaces, retroperitoneum, celiac axis, and pelvis. Changes demonstrate significant progression since previous study, possibly indicating rapid progression of metastatic disease. Also could consider lymphoma. 2. Moderate free fluid in the abdomen and pelvis. 3. Diffuse soft tissue edema throughout the subcutaneous fat. 4. Moderate bilateral pleural effusions with basilar atelectasis or consolidation. Electronically Signed   By: Lucienne Capers M.D.   On: 11/09/2016 23:43   Ct Femur Left W Contrast  Result Date: 11/10/2016 CLINICAL DATA:  Left femur fracture status post fall 3 days ago. EXAM: CT OF THE LOWER RIGHT EXTREMITY WITH CONTRAST TECHNIQUE: Multidetector CT imaging of the lower right extremity was performed according to the standard protocol following intravenous contrast administration. COMPARISON:  None. CONTRAST:  164mL ISOVUE-300 IOPAMIDOL (ISOVUE-300) INJECTION 61%  FINDINGS: Bones/Joint/Cartilage No left hip fracture or dislocation. Left hip joint space is relatively well maintained. Left total knee arthroplasty without hardware failure or complication. Beam hardening artifact resulting from the hardware partially obscuring the adjacent soft tissue osseous structures. Mildly comminuted, nondisplaced fracture of the distal femoral metaphysis transfixed with a intramedullary nail. Near anatomic alignment. No hardware failure or complication. Postsurgical changes in the surrounding soft tissues. No other fracture or dislocation. Small knee joint effusion. No aggressive lytic or sclerotic osseous lesion. Normal alignment. No joint effusion. Ligaments Ligaments are suboptimally evaluated by CT. Muscles and Tendons Soft tissue edema in the subcutaneous fat of the left thigh primarily along the lateral aspect likely postsurgical. No fluid collection or hematoma. Quadriceps and patellar tendons are intact. Soft tissue No fluid collection or hematoma.  No soft tissue mass. IMPRESSION: 1. Interval ORIF of a distal left femoral metaphysis fracture in near anatomic alignment. Electronically Signed   By: Kathreen Devoid   On: 11/10/2016 08:12   Dg Chest Port 1 View  Result Date: 11/12/2016 CLINICAL DATA:  Shortness of Breath EXAM: PORTABLE CHEST 1 VIEW COMPARISON:  November 09, 2016 FINDINGS: There are bilateral pleural effusions with moderate interstitial edema. There is mild cardiac prominence with pulmonary venous hypertension. There is atelectasis in lung bases without frank airspace consolidation. There is a total shoulder replacement on the right. IMPRESSION: Findings indicative of congestive heart failure with slightly more edema than on recent study.  Atelectasis in the bases without frank airspace consolidation. Electronically Signed   By: Lowella Grip III M.D.   On: 11/12/2016 09:48   Dg Chest Port 1 View  Result Date: 11/09/2016 CLINICAL DATA:  Leukocytosis EXAM:  PORTABLE CHEST 1 VIEW COMPARISON:  10/28/2016 FINDINGS: Right shoulder replacement. Small bilateral effusions. Left greater than right lung base opacification. Cardiomegaly. No pneumothorax IMPRESSION: Small bilateral effusion with left greater than right bibasilar airspace disease which may be secondary to atelectasis or pneumonia Electronically Signed   By: Donavan Foil M.D.   On: 11/09/2016 20:33   Ir Nephrostomy Placement Right  Result Date: 11/10/2016 INDICATION: 76 year old female with advanced right-sided urothelial cancer and what appears to be severe right-sided hydronephrosis with unobstructed double-J ureteral stent. Additionally, she has urosepsis. She presents for percutaneous nephrostomy tube placement. EXAM: IR NEPHROSTOMY PLACEMENT RIGHT COMPARISON:  CT scan abdomen/pelvis 11/09/2016 MEDICATIONS: Patient is currently an inpatient in receiving intravenous antibiotics. No additional antibiotic prophylaxis was administered. ANESTHESIA/SEDATION: 1 mg Versed CONTRAST:  15 mL Isovue-300 - administered into the collecting system(s) FLUOROSCOPY TIME:  Fluoroscopy Time: 3 minutes 30 seconds (49.5 mGy). COMPLICATIONS: None immediate. PROCEDURE: Informed written consent was obtained from the patient after a thorough discussion of the procedural risks, benefits and alternatives. All questions were addressed. Maximal Sterile Barrier Technique was utilized including caps, mask, sterile gowns, sterile gloves, sterile drape, hand hygiene and skin antiseptic. A timeout was performed prior to the initiation of the procedure. The right flank was interrogated with ultrasound. The kidney is extremely abnormal in sonographic appearance. The kidney is diffusely heterogeneous with areas of hypoechoic, echogenic and intermediate echogenicity throughout. No definite hydronephrosis or dilated calices are identified. Local anesthesia was attained by infiltration with 1% lidocaine. Under real-time sonographic guidance, the  kidney was punctured using a 21 gauge Accustick needle. A relatively hypoechoic portion of the kidney was targeted. The needle tip was easily visualized within the hypoechoic segment. Aspiration yields no material. A gentle injection of contrast through the needle demonstrates irregular nonspecific patchy staining. It is unclear if the needle tip is within a renal calyx filled with thick material/debris or if it is within the renal parenchyma itself. The CT scan was brought up and evaluated real-time while ultrasound Ing the kidney. There is no definitively normal kidney or collecting system visible. Under fluoroscopy, the Accustick needle was advanced into the same location as the double-J ureteral stent which should be in the renal pelvis. Again, urine could not be aspirated. Injection of contrast material demonstrates irregular pooling of contrast. Again, it is unclear if this is within the renal collecting system or diseased parenchyma. The 0.018 inch wire was carefully advanced through the needle. This appears formed within a space. The needle was exchanged over the wire for the Accustick dilator. Aspiration yields a small amount of thick, purulent fluid/debris. A 0.035 Amplatz wire was then advanced. The Accustick sheath was removed. The skin tract was dilated to 10 [place] and a Cook 10.2 [place] drainage catheter was advanced over the wire. The drainage catheter is only partially formed. Aspiration again yields PICC bloody purulent/ debris filled material. No definite urine is aspirated. The catheter was connected to gravity bag drainage and secured to the skin with 0 Prolene suture. IMPRESSION: 1. Successful placement of a 10 French percutaneous nephrostomy tube into the complex left renal mass in the region of the double-J ureteral stent. Of note, it is unclear if this drainage catheter is within the renal collecting system, or within heavily diseased  renal parenchyma. No normal collecting system or urine  could be identified. The catheter is draining thick bloody purulent/necrotic material. PLAN: 1. Cultures were sent. 2. Monitor drain output. 3. Do not flush drain. 4. Consider hospice consultation. Electronically Signed   By: Jacqulynn Cadet M.D.   On: 11/10/2016 14:40    Labs:  CBC:  Recent Labs  11/10/16 0211 11/11/16 0325 11/12/16 0351 11/13/16 0642  WBC 20.5* 15.3* 13.5* PENDING  HGB 8.3* 8.0* 7.5* 8.8*  HCT 28.4* 27.2* 25.7* 28.9*  PLT 143* 146* 131* PENDING    COAGS:  Recent Labs  10/17/2016 1403  INR 1.15    BMP:  Recent Labs  11/10/16 0211 11/11/16 0325 11/12/16 0351 11/13/16 0331  NA 148* 144 143 142  K 5.1 4.5 4.3 4.7  CL 115* 109 110 108  CO2 20* 25 25 24   GLUCOSE 94 109* 71 90  BUN 42* 40* 37* 39*  CALCIUM 8.4* 8.0* 8.1* 8.2*  CREATININE 1.55* 1.37* 1.31* 1.51*  GFRNONAA 31* 36* 38* 32*  GFRAA 36* 42* 44* 37*    LIVER FUNCTION TESTS:  Recent Labs  10/30/16 0253 11/01/16 0439 11/07/16 0950 11/08/16 1046 11/11/16 0325 11/13/16 0331  BILITOT 0.9 0.7 0.4 0.7  --   --   AST 44* 37 69* 62*  --   --   ALT 10* 10* 12* 11*  --   --   ALKPHOS 114 116 94 104  --   --   PROT 5.0* 5.1* 5.1* 5.1*  --   --   ALBUMIN 1.7* 1.6* 1.4* 1.6* 2.2* 2.8*    Assessment and Plan:  PCN in place OP dark Plan per Urology   Electronically Signed: Monia Sabal A, PA-C 11/13/2016, 10:18 AM   I spent a total of 15 Minutes at the the patient's bedside AND on the patient's hospital floor or unit, greater than 50% of which was counseling/coordinating care for R PCN

## 2016-11-13 NOTE — Progress Notes (Signed)
Peripherally Inserted Central Catheter/Midline Placement  The IV Nurse has discussed with the patient and/or persons authorized to consent for the patient, the purpose of this procedure and the potential benefits and risks involved with this procedure.  The benefits include less needle sticks, lab draws from the catheter, and the patient may be discharged home with the catheter. Risks include, but not limited to, infection, bleeding, blood clot (thrombus formation), and puncture of an artery; nerve damage and irregular heartbeat and possibility to perform a PICC exchange if needed/ordered by physician.  Alternatives to this procedure were also discussed.  Bard Power PICC patient education guide, fact sheet on infection prevention and patient information card has been provided to patient /or left at bedside.    PICC/Midline Placement Documentation        Isabel Collins 11/13/2016, 2:42 PM Consent obtained by staff RN from 2 MDs for PICC insertion

## 2016-11-13 NOTE — Progress Notes (Addendum)
Nutrition Follow-up  DOCUMENTATION CODES:   Not applicable  INTERVENTION:    Advance diet back to Dys 1-thin liquid as medically appropriate    Continue Ensure Enlive po BID, each supplement provides 350 kcal and 20 grams of protein  NUTRITION DIAGNOSIS:   Increased nutrient needs related to  (post-op healing) as evidenced by estimated needs  Ongoing   GOAL:   Patient will meet greater than or equal to 90% of their needs  Currently unmet   MONITOR:   PO intake, Supplement acceptance, Labs, Weight trends, Skin, I & O's  ASSESSMENT:   Isabel Collins is a 76 y.o. female with medical history significant of COPD, diabetes mellitus, hypertension, hyperlipidemia, recent diagnosis of right upper pole transitional cell carcinoma, seen recently by urology with recent ureteroscopy/biopsy and stent, supposed to have nephrectomy 3 days from now, presents to the emergency room from her skilled nursing home after having a fall and injuring her left knee.   Pt admitted with acute displaced fracture of left femur.  S/P intramedullary retrograde femoral nailing 9/4.  Pt s/p R nephrostomy placement per IR 9/8. Currently NPO. Previously on a Dysphagia 1-thin liquid diet. Speech Path following. PO intake was very poor at 10% per flowsheet records. Labs and medications reviewed. Mg 2.8 (H). CBG's [id number].  Diet Order:  Diet NPO time specified  Skin:   (closed lt thigh incision)  Last BM:  9/11  Height:   Ht Readings from Last 1 Encounters:  11/25/2016 5\' 2"  (1.575 m)   Weight:   Wt Readings from Last 1 Encounters:  11/13/16 207 lb (93.9 kg)  Admit wt         144 lb (65.3 kg)  Ideal Body Weight:  50 kg  BMI:  Body mass index is 37.86 kg/m. highly skewed  Estimated Nutritional Needs:   Kcal:  1650-1850  Protein:  80-95 grams  Fluid:  1.6-1.8 L  EDUCATION NEEDS:   No education needs identified at this time  Arthur Holms, RD, LDN Pager #:  [phone] After-Hours Pager #: [phone]

## 2016-11-13 NOTE — Progress Notes (Signed)
[facility] Progress Note    Assessment/ Plan:   Assessment:  1 Obstructive uropathy due to cancer- s/p IR nephrostomy tube 2 Urothelial cancer- known to urology 3 E coli bacteremia and UTI- on antibiotics and now some pressors for shock 4 Hypernatremia- resolved with hypotonic fluids 5 Severe hypoalbuminemia- s/p IV albumin 6 Shock due to sepsis 7 AKI, hemodynamic and obstructive-now nonoliguric, improving  Plan: 1 Pt is non-oliguric and her serum sodium is better.  I do not expect her anasarca to improve drastically until no longer hypoalbuminemic.    She is not a dialysis candidate due to her co-morbids.  We'll sign off. Please don't hesitate to ask [place] any questions.  Subjective:    Respiratory status worsened yesterday, transferred to ICU for BiPaP.  Nursing notes pocketing of pills in mouth.   Objective:   BP 96/64   Pulse (!) 102   Temp 98.3 F (36.8 C) (Oral)   Resp (!) 23   Ht 5' 2"  (1.575 m)   Wt 93.9 kg (207 lb)   SpO2 99%   BMI 37.86 kg/m   Intake/Output Summary (Last 24 hours) at 11/13/16 0931 Last data filed at 11/13/16 0700  Gross per 24 hour  Intake           946.67 ml  Output              810 ml  Net           136.67 ml   Weight change: 3.629 kg (8 lb)  Physical Exam: Gen: ill appearing, a little more arousable than yesterday CVS: tachycardic Resp:BiPaP in place Abd: NABS Ext: + anasarca  Imaging: Dg Chest Port 1 View  Result Date: 11/12/2016 CLINICAL DATA:  Shortness of Breath EXAM: PORTABLE CHEST 1 VIEW COMPARISON:  November 09, 2016 FINDINGS: There are bilateral pleural effusions with moderate interstitial edema. There is mild cardiac prominence with pulmonary venous hypertension. There is atelectasis in lung bases without frank airspace consolidation. There is a total shoulder replacement on the right. IMPRESSION: Findings indicative of congestive heart failure with slightly more edema than on recent study.  Atelectasis in the bases without frank airspace consolidation. Electronically Signed   By: Lowella Grip III M.D.   On: 11/12/2016 09:48    Labs: BMET  Recent Labs Lab 11/07/16 0950 11/08/16 1046 11/09/16 0530 11/09/16 0936 11/10/16 0211 11/11/16 0325 11/12/16 0351 11/13/16 0331  NA 146* 147* 147*  --  148* 144 143 142  K 4.8 4.9 5.5* 5.0 5.1 4.5 4.3 4.7  CL 120* 120* 116*  --  115* 109 110 108  CO2 14* 17* 19*  --  20* 25 25 24   GLUCOSE 137* 111* 82  --  94 109* 71 90  BUN 28* 34* 38*  --  42* 40* 37* 39*  CREATININE 1.25* 1.44* 1.42*  --  1.55* 1.37* 1.31* 1.51*  CALCIUM 7.9* 8.0* 8.2*  --  8.4* 8.0* 8.1* 8.2*  PHOS  --   --   --   --   --  3.7  --  4.3   CBC  Recent Labs Lab 11/10/16 0211 11/11/16 0325 11/12/16 0351 11/13/16 0642  WBC 20.5* 15.3* 13.5* PENDING  NEUTROABS 15.9* 12.8* 10.8* PENDING  HGB 8.3* 8.0* 7.5* 8.8*  HCT 28.4* 27.2* 25.7* 28.9*  MCV 87.1 86.3 86.5 87.0  PLT 143* 146* 131* PENDING    Medications:    . atorvastatin  40 mg Oral QHS  . budesonide (PULMICORT) nebulizer  solution  0.5 mg Nebulization BID  . chlorhexidine gluconate (MEDLINE KIT)  15 mL Mouth Rinse BID  . cycloSPORINE  1 drop Both Eyes BID  . feeding supplement (ENSURE ENLIVE)  237 mL Oral BID BM  . ipratropium-albuterol  3 mL Nebulization Q6H  . mouth rinse  15 mL Mouth Rinse QID      Madelon Lips, MD [facility] pgr [phone] 11/13/2016, 9:31 AM

## 2016-11-13 NOTE — Progress Notes (Signed)
INFECTIOUS DISEASE PROGRESS NOTE  ID: Isabel Collins is a 76 y.o. female with  Principal Problem:   Acute respiratory distress Active Problems:   Hypertension   Physical deconditioning   Hypothyroidism   Chronic obstructive pulmonary disease (HCC)   Type 2 diabetes mellitus with complication, without long-term current use of insulin (HCC)   Urothelial carcinoma of kidney, right (HCC)   Gastroesophageal reflux disease   History of recent fall   Right renal mass   Fracture of knee prosthesis (HCC)   AKI (acute kidney injury) ([place])   ESBL (extended spectrum beta-lactamase) producing bacteria infection   Closed comminuted supracondylar fracture of left femur (HCC)   Renal failure   Fall   Sepsis due to Escherichia coli (E. coli) (HCC)   Gram-negative bacteremia   Sepsis secondary to UTI ([place])   Transitional cell carcinoma (HCC)   Hypervolemia   HCAP (healthcare-associated pneumonia)   Leukocytosis   Encephalopathy acute   Hydronephrosis of right kidney   Abnormal CT scan, pelvis: 11/09/2016   Closed fracture of left distal femur (HCC)   Pleural effusion on right   Hypoalbuminemia   Acute renal failure (ARF) (HCC)   Acute respiratory failure with hypoxia (HCC)  Subjective: Feels poorly.  On neo  Abtx:  Anti-infectives    Start     Dose/Rate Route Frequency Ordered Stop   11/07/16 0900  ceFAZolin (ANCEF) IVPB 2g/100 mL premix  Status:  Discontinued     2 g 200 mL/hr over 30 Minutes Intravenous To Short Stay 11/07/16 0809 11/07/16 0934   11/02/16 0700  ceFAZolin (ANCEF) IVPB 2g/100 mL premix     2 g 200 mL/hr over 30 Minutes Intravenous To Short Stay 10/30/16 1212 11/03/16 0700   11/02/16 0700  gentamicin (GARAMYCIN) 280 mg in dextrose 5 % 100 mL IVPB  Status:  Discontinued     5 mg/kg  56.2 kg (Adjusted) 107 mL/hr over 60 Minutes Intravenous 30 min pre-op 11/01/16 0932 11/02/16 1006   10/30/16 1000  fluconazole (DIFLUCAN) tablet 100 mg  Status:  Discontinued     100 mg Oral Daily 10/22/2016 1841 11/12/16 0926   10/30/16 0600  ceFAZolin (ANCEF) IVPB 2g/100 mL premix  Status:  Discontinued     2 g 200 mL/hr over 30 Minutes Intravenous On call to O.R. 10/24/2016 1925 10/30/16 1212   10/30/16 0500  meropenem (MERREM) 1 g in sodium chloride 0.9 % 100 mL IVPB     1 g 200 mL/hr over 30 Minutes Intravenous Every 12 hours 10/14/2016 1632     10/31/2016 1630  meropenem (MERREM) 1 g in sodium chloride 0.9 % 100 mL IVPB     1 g 200 mL/hr over 30 Minutes Intravenous STAT 10/07/2016 1616 10/31/2016 1919   10/24/2016 1545  vancomycin (VANCOCIN) IVPB 1000 mg/200 mL premix     1,000 mg 200 mL/hr over 60 Minutes Intravenous  Once 10/03/2016 1543 10/11/2016 1727   10/04/2016 1545  piperacillin-tazobactam (ZOSYN) IVPB 3.375 g  Status:  Discontinued     3.375 g 100 mL/hr over 30 Minutes Intravenous  Once 10/30/2016 1543 10/25/2016 1546      Medications:  Scheduled: . atorvastatin  40 mg Oral QHS  . budesonide (PULMICORT) nebulizer solution  0.5 mg Nebulization BID  . chlorhexidine gluconate (MEDLINE KIT)  15 mL Mouth Rinse BID  . cycloSPORINE  1 drop Both Eyes BID  . feeding supplement (ENSURE ENLIVE)  237 mL Oral BID BM  . ipratropium-albuterol  3 mL Nebulization  Q6H  . mouth rinse  15 mL Mouth Rinse QID    Objective: Vital signs in last 24 hours: Temp:  [98 F (36.7 C)-98.6 F (37 C)] 98.3 F (36.8 C) (09/11 0830) Pulse Rate:  [89-112] 102 (09/11 0344) Resp:  [13-27] 17 (09/11 1030) BP: (76-155)/(35-131) 103/68 (09/11 1030) SpO2:  [75 %-100 %] 96 % (09/11 1030) FiO2 (%):  [40 %] 40 % (09/11 0820) Weight:  [93.9 kg (207 lb)] 93.9 kg (207 lb) (09/11 0357)   General appearance: alert and mild distress Resp: diminished breath sounds anterior - bilateral Cardio: tachycardia GI: normal findings: bowel sounds normal and soft, non-tender Extremities: edema 3+ BLE. LLE wrapped.   Lab Results  Recent Labs  11/12/16 0351 11/13/16 0331 11/13/16 0642  WBC 13.5*  --  18.9*    HGB 7.5*  --  8.8*  HCT 25.7*  --  28.9*  NA 143 142  --   K 4.3 4.7  --   CL 110 108  --   CO2 25 24  --   BUN 37* 39*  --   CREATININE 1.31* 1.51*  --    Liver Panel  Recent Labs  11/11/16 0325 11/13/16 0331  ALBUMIN 2.2* 2.8*   Sedimentation Rate No results for input(s): ESRSEDRATE in the last 72 hours. C-Reactive Protein No results for input(s): CRP in the last 72 hours.  Microbiology: Recent Results (from the past 240 hour(s))  Urine culture     Status: None   Collection Time: 11/07/16  8:10 AM  Result Value Ref Range Status   Specimen Description URINE, CLEAN CATCH  Final   Special Requests NONE  Final   Culture NO GROWTH  Final   Report Status 11/08/2016 FINAL  Final  Culture, blood (Routine X 2) w Reflex to ID Panel     Status: None (Preliminary result)   Collection Time: 11/09/16  7:02 PM  Result Value Ref Range Status   Specimen Description BLOOD RIGHT HAND  Final   Special Requests IN PEDIATRIC BOTTLE Blood Culture adequate volume  Final   Culture NO GROWTH 4 DAYS  Final   Report Status PENDING  Incomplete  Culture, blood (Routine X 2) w Reflex to ID Panel     Status: None (Preliminary result)   Collection Time: 11/09/16  7:20 PM  Result Value Ref Range Status   Specimen Description BLOOD LEFT HAND  Final   Special Requests   Final    BOTTLES DRAWN AEROBIC ONLY Blood Culture adequate volume   Culture NO GROWTH 4 DAYS  Final   Report Status PENDING  Incomplete  MRSA PCR Screening     Status: None   Collection Time: 11/10/16  2:01 AM  Result Value Ref Range Status   MRSA by PCR NEGATIVE NEGATIVE Final    Comment:        The GeneXpert MRSA Assay (FDA approved for NASAL specimens only), is one component of a comprehensive MRSA colonization surveillance program. It is not intended to diagnose MRSA infection nor to guide or monitor treatment for MRSA infections.   Urine Culture     Status: None   Collection Time: 11/10/16  9:41 AM  Result Value  Ref Range Status   Specimen Description URINE, CATHETERIZED  Final   Special Requests NONE  Final   Culture NO GROWTH  Final   Report Status 11/11/2016 FINAL  Final  Body fluid culture     Status: None   Collection Time: 11/10/16  1:39 PM  Result Value Ref Range Status   Specimen Description FLUID KIDNEY RIGHT  Final   Special Requests NONE  Final   Gram Stain   Final    ABUNDANT WBC PRESENT, PREDOMINANTLY MONONUCLEAR MODERATE GRAM NEGATIVE RODS    Culture   Final    ABUNDANT ESCHERICHIA COLI Confirmed Extended Spectrum Beta-Lactamase Producer (ESBL)    Report Status 11/13/2016 FINAL  Final   Organism ID, Bacteria ESCHERICHIA COLI  Final      Susceptibility   Escherichia coli - MIC*    AMPICILLIN >=32 RESISTANT Resistant     CEFAZOLIN >=64 RESISTANT Resistant     CEFEPIME >=64 RESISTANT Resistant     CEFTAZIDIME 16 INTERMEDIATE Intermediate     CEFTRIAXONE >=64 RESISTANT Resistant     CIPROFLOXACIN >=4 RESISTANT Resistant     GENTAMICIN <=1 SENSITIVE Sensitive     IMIPENEM <=0.25 SENSITIVE Sensitive     TRIMETH/SULFA >=320 RESISTANT Resistant     AMPICILLIN/SULBACTAM >=32 RESISTANT Resistant     PIP/TAZO 16 SENSITIVE Sensitive     Extended ESBL POSITIVE Resistant     * ABUNDANT ESCHERICHIA COLI    Studies/Results: Dg Chest Port 1 View  Result Date: 11/12/2016 CLINICAL DATA:  Shortness of Breath EXAM: PORTABLE CHEST 1 VIEW COMPARISON:  November 09, 2016 FINDINGS: There are bilateral pleural effusions with moderate interstitial edema. There is mild cardiac prominence with pulmonary venous hypertension. There is atelectasis in lung bases without frank airspace consolidation. There is a total shoulder replacement on the right. IMPRESSION: Findings indicative of congestive heart failure with slightly more edema than on recent study. Atelectasis in the bases without frank airspace consolidation. Electronically Signed   By: Lowella Grip III M.D.   On: 11/12/2016 09:48      Assessment/Plan: Transitoinal Cell CA of R kidney Obstructive uropathy ESBL E coli UTI Femur fracture with L IM nail on 9-4  Total days of antibiotics: 14 merrem  Hypotension overnight, receiving albumin. +lactic acid. +neo Her Cr is worse today, probably in range of her baseline. WBC is worse today She's been afebrile.  No change in merrem.  Consider having her femur wound eval by ortho.            Bobby Rumpf Infectious Diseases (pager) [phone] [email or web address] 11/13/2016, 10:54 AM  LOS: 15 days

## 2016-11-14 ENCOUNTER — Inpatient Hospital Stay (HOSPITAL_COMMUNITY): Payer: Medicare Other

## 2016-11-14 DIAGNOSIS — S72452S Displaced supracondylar fracture without intracondylar extension of lower end of left femur, sequela: Secondary | ICD-10-CM

## 2016-11-14 DIAGNOSIS — D72829 Elevated white blood cell count, unspecified: Secondary | ICD-10-CM

## 2016-11-14 DIAGNOSIS — T84019A Broken internal joint prosthesis, unspecified site, initial encounter: Secondary | ICD-10-CM

## 2016-11-14 DIAGNOSIS — S72402A Unspecified fracture of lower end of left femur, initial encounter for closed fracture: Secondary | ICD-10-CM

## 2016-11-14 DIAGNOSIS — R7881 Bacteremia: Secondary | ICD-10-CM

## 2016-11-14 DIAGNOSIS — S72452A Displaced supracondylar fracture without intracondylar extension of lower end of left femur, initial encounter for closed fracture: Secondary | ICD-10-CM

## 2016-11-14 LAB — RENAL FUNCTION PANEL
Albumin: 2 g/dL — ABNORMAL LOW (ref 3.5–5.0)
Anion gap: 7 (ref 5–15)
BUN: 33 mg/dL — AB (ref 6–20)
CHLORIDE: 117 mmol/L — AB (ref 101–111)
CO2: 18 mmol/L — AB (ref 22–32)
CREATININE: 1.08 mg/dL — AB (ref 0.44–1.00)
Calcium: 6.4 mg/dL — CL (ref 8.9–10.3)
GFR calc Af Amer: 56 mL/min — ABNORMAL LOW (ref >60)
GFR calc non Af Amer: 48 mL/min — ABNORMAL LOW (ref >60)
GLUCOSE: 80 mg/dL (ref 65–99)
Phosphorus: 3.4 mg/dL (ref 2.5–4.6)
Potassium: 3.7 mmol/L (ref 3.5–5.1)
Sodium: 142 mmol/L (ref 135–145)

## 2016-11-14 LAB — GLUCOSE, CAPILLARY
GLUCOSE-CAPILLARY: 94 mg/dL (ref 65–99)
GLUCOSE-CAPILLARY: 87 mg/dL (ref 65–99)
Glucose-Capillary: 78 mg/dL (ref 65–99)
Glucose-Capillary: 79 mg/dL (ref 65–99)
Glucose-Capillary: 81 mg/dL (ref 65–99)

## 2016-11-14 LAB — ECHOCARDIOGRAM COMPLETE
HEIGHTINCHES: 62 in
Weight: 3264 oz

## 2016-11-14 LAB — CBC WITH DIFFERENTIAL/PLATELET
BASOS PCT: 0 %
Basophils Absolute: 0 10*3/uL (ref 0.0–0.1)
EOS ABS: 0.5 10*3/uL (ref 0.0–0.7)
EOS PCT: 3 %
HCT: 24.8 % — ABNORMAL LOW (ref 36.0–46.0)
HEMOGLOBIN: 7.3 g/dL — AB (ref 12.0–15.0)
Lymphocytes Relative: 8 %
Lymphs Abs: 1.3 10*3/uL (ref 0.7–4.0)
MCH: 25.9 pg — ABNORMAL LOW (ref 26.0–34.0)
MCHC: 29.4 g/dL — AB (ref 30.0–36.0)
MCV: 87.9 fL (ref 78.0–100.0)
MONOS PCT: 9 %
Monocytes Absolute: 1.3 10*3/uL — ABNORMAL HIGH (ref 0.1–1.0)
NEUTROS PCT: 80 %
Neutro Abs: 12.3 10*3/uL — ABNORMAL HIGH (ref 1.7–7.7)
PLATELETS: 128 10*3/uL — AB (ref 150–400)
RBC: 2.82 MIL/uL — AB (ref 3.87–5.11)
RDW: 19.5 % — ABNORMAL HIGH (ref 11.5–15.5)
WBC: 15.4 10*3/uL — AB (ref 4.0–10.5)

## 2016-11-14 LAB — CULTURE, BLOOD (ROUTINE X 2)
CULTURE: NO GROWTH
Culture: NO GROWTH
Special Requests: ADEQUATE
Special Requests: ADEQUATE

## 2016-11-14 LAB — PROCALCITONIN: PROCALCITONIN: 2.69 ng/mL

## 2016-11-14 LAB — MAGNESIUM: MAGNESIUM: 2.1 mg/dL (ref 1.7–2.4)

## 2016-11-14 MED ORDER — DEXTROSE 10 % IV SOLN
INTRAVENOUS | Status: DC
  Administered 2016-11-14 – 2016-11-18 (×2): via INTRAVENOUS

## 2016-11-14 MED ORDER — PHENYLEPHRINE HCL 10 MG/ML IJ SOLN
0.0000 ug/min | INTRAMUSCULAR | Status: DC
  Administered 2016-11-14: 75 ug/min via INTRAVENOUS
  Administered 2016-11-15: 85 ug/min via INTRAVENOUS
  Administered 2016-11-16: 95 ug/min via INTRAVENOUS
  Administered 2016-11-16: 125 ug/min via INTRAVENOUS
  Administered 2016-11-16: 160 ug/min via INTRAVENOUS
  Administered 2016-11-16: 155 ug/min via INTRAVENOUS
  Administered 2016-11-17: 150 ug/min via INTRAVENOUS
  Administered 2016-11-17: 90 ug/min via INTRAVENOUS
  Administered 2016-11-17: 150 ug/min via INTRAVENOUS
  Administered 2016-11-17: 120 ug/min via INTRAVENOUS
  Administered 2016-11-18: 20 ug/min via INTRAVENOUS
  Administered 2016-11-18: 70 ug/min via INTRAVENOUS
  Administered 2016-11-18: 90 ug/min via INTRAVENOUS
  Filled 2016-11-14 (×6): qty 4
  Filled 2016-11-14: qty 40
  Filled 2016-11-14 (×9): qty 4

## 2016-11-14 MED ORDER — MAGIC MOUTHWASH
5.0000 mL | Freq: Three times a day (TID) | ORAL | Status: DC | PRN
  Administered 2016-11-14 – 2016-11-22 (×2): 5 mL via ORAL
  Filled 2016-11-14 (×3): qty 5

## 2016-11-14 MED ORDER — SIMETHICONE 80 MG PO CHEW
80.0000 mg | CHEWABLE_TABLET | Freq: Once | ORAL | Status: AC
Start: 2016-11-14 — End: 2016-11-14
  Administered 2016-11-14: 80 mg via ORAL
  Filled 2016-11-14: qty 1

## 2016-11-14 MED ORDER — FUROSEMIDE 10 MG/ML IJ SOLN
5.0000 mg/h | INTRAVENOUS | Status: DC
  Administered 2016-11-14: 5 mg/h via INTRAVENOUS
  Filled 2016-11-14: qty 25

## 2016-11-14 MED ORDER — POTASSIUM CHLORIDE 20 MEQ/15ML (10%) PO SOLN
40.0000 meq | Freq: Once | ORAL | Status: AC
  Administered 2016-11-14: 40 meq
  Filled 2016-11-14: qty 30

## 2016-11-14 MED ORDER — SODIUM CHLORIDE 0.9 % IV SOLN
1.0000 g | Freq: Once | INTRAVENOUS | Status: AC
  Administered 2016-11-14: 1 g via INTRAVENOUS
  Filled 2016-11-14: qty 10

## 2016-11-14 MED ORDER — FUROSEMIDE 10 MG/ML IJ SOLN
40.0000 mg | Freq: Once | INTRAMUSCULAR | Status: AC
  Administered 2016-11-14: 40 mg via INTRAVENOUS
  Filled 2016-11-14: qty 4

## 2016-11-14 NOTE — Progress Notes (Signed)
INFECTIOUS DISEASE PROGRESS NOTE  ID: Isabel Collins is a 76 y.o. female with  Principal Problem:   Acute respiratory distress Active Problems:   Hypertension   Physical deconditioning   Hypothyroidism   Chronic obstructive pulmonary disease (HCC)   Type 2 diabetes mellitus with complication, without long-term current use of insulin (HCC)   Urothelial carcinoma of kidney, right (HCC)   Gastroesophageal reflux disease   History of recent fall   Right renal mass   Fracture of knee prosthesis (HCC)   AKI (acute kidney injury) ([place])   ESBL (extended spectrum beta-lactamase) producing bacteria infection   Closed comminuted supracondylar fracture of left femur (HCC)   Renal failure   Fall   Sepsis due to Escherichia coli (E. coli) (HCC)   Gram-negative bacteremia   Sepsis secondary to UTI ([place])   Transitional cell carcinoma (HCC)   Hypervolemia   HCAP (healthcare-associated pneumonia)   Leukocytosis   Encephalopathy acute   Hydronephrosis of right kidney   Abnormal CT scan, pelvis: 11/09/2016   Closed fracture of left distal femur (HCC)   Pleural effusion on right   Hypoalbuminemia   Acute renal failure (ARF) (HCC)   Acute respiratory failure with hypoxia (HCC)  Subjective: On CPAP  Abtx:  Anti-infectives    Start     Dose/Rate Route Frequency Ordered Stop   11/07/16 0900  ceFAZolin (ANCEF) IVPB 2g/100 mL premix  Status:  Discontinued     2 g 200 mL/hr over 30 Minutes Intravenous To Short Stay 11/07/16 0809 11/07/16 0934   11/02/16 0700  ceFAZolin (ANCEF) IVPB 2g/100 mL premix     2 g 200 mL/hr over 30 Minutes Intravenous To Short Stay 10/30/16 1212 11/03/16 0700   11/02/16 0700  gentamicin (GARAMYCIN) 280 mg in dextrose 5 % 100 mL IVPB  Status:  Discontinued     5 mg/kg  56.2 kg (Adjusted) 107 mL/hr over 60 Minutes Intravenous 30 min pre-op 11/01/16 0932 11/02/16 1006   10/30/16 1000  fluconazole (DIFLUCAN) tablet 100 mg  Status:  Discontinued     100 mg Oral  Daily 10/10/2016 1841 11/12/16 0926   10/30/16 0600  ceFAZolin (ANCEF) IVPB 2g/100 mL premix  Status:  Discontinued     2 g 200 mL/hr over 30 Minutes Intravenous On call to O.R. 10/10/2016 1925 10/30/16 1212   10/30/16 0500  meropenem (MERREM) 1 g in sodium chloride 0.9 % 100 mL IVPB     1 g 200 mL/hr over 30 Minutes Intravenous Every 12 hours 10/25/2016 1632     10/28/2016 1630  meropenem (MERREM) 1 g in sodium chloride 0.9 % 100 mL IVPB     1 g 200 mL/hr over 30 Minutes Intravenous STAT 10/28/2016 1616 11/01/2016 1919   10/08/2016 1545  vancomycin (VANCOCIN) IVPB 1000 mg/200 mL premix     1,000 mg 200 mL/hr over 60 Minutes Intravenous  Once 10/06/2016 1543 10/16/2016 1727   10/10/2016 1545  piperacillin-tazobactam (ZOSYN) IVPB 3.375 g  Status:  Discontinued     3.375 g 100 mL/hr over 30 Minutes Intravenous  Once 10/08/2016 1543 10/30/2016 1546      Medications:  Scheduled: . atorvastatin  40 mg Oral QHS  . budesonide (PULMICORT) nebulizer solution  0.5 mg Nebulization BID  . chlorhexidine gluconate (MEDLINE KIT)  15 mL Mouth Rinse BID  . Chlorhexidine Gluconate Cloth  6 each Topical Daily  . cycloSPORINE  1 drop Both Eyes BID  . feeding supplement (ENSURE ENLIVE)  237 mL Oral BID  BM  . furosemide  40 mg Intravenous Once  . ipratropium-albuterol  3 mL Nebulization Q6H  . mouth rinse  15 mL Mouth Rinse QID  . simethicone  80 mg Oral Once  . sodium chloride flush  10-40 mL Intracatheter Q12H    Objective: Vital signs in last 24 hours: Temp:  [97.6 F (36.4 C)-99 F (37.2 C)] 97.8 F (36.6 C) (09/11 2300) Pulse Rate:  [101-102] 101 (09/12 0807) Resp:  [14-28] 22 (09/12 0807) BP: (80-120)/(29-104) 108/52 (09/12 0807) SpO2:  [88 %-100 %] 96 % (09/12 0800) FiO2 (%):  [40 %] 40 % (09/12 0808) Weight:  [92.5 kg (204 lb)] 92.5 kg (204 lb) (09/12 0500)   General appearance: mild distress Resp: diminished breath sounds bilaterally Cardio: regular rate and rhythm GI: normal findings: bowel sounds  normal and soft, non-tender  Lab Results  Recent Labs  11/13/16 0331 11/13/16 0642 11/14/16 0531 11/14/16 0710  WBC  --  18.9* 15.4*  --   HGB  --  8.8* 7.3*  --   HCT  --  28.9* 24.8*  --   NA 142  --   --  142  K 4.7  --   --  3.7  CL 108  --   --  117*  CO2 24  --   --  18*  BUN 39*  --   --  33*  CREATININE 1.51*  --   --  1.08*   Liver Panel  Recent Labs  11/13/16 0331 11/14/16 0710  ALBUMIN 2.8* 2.0*   Sedimentation Rate No results for input(s): ESRSEDRATE in the last 72 hours. C-Reactive Protein No results for input(s): CRP in the last 72 hours.  Microbiology: Recent Results (from the past 240 hour(s))  Urine culture     Status: None   Collection Time: 11/07/16  8:10 AM  Result Value Ref Range Status   Specimen Description URINE, CLEAN CATCH  Final   Special Requests NONE  Final   Culture NO GROWTH  Final   Report Status 11/08/2016 FINAL  Final  Culture, blood (Routine X 2) w Reflex to ID Panel     Status: None (Preliminary result)   Collection Time: 11/09/16  7:02 PM  Result Value Ref Range Status   Specimen Description BLOOD RIGHT HAND  Final   Special Requests IN PEDIATRIC BOTTLE Blood Culture adequate volume  Final   Culture NO GROWTH 4 DAYS  Final   Report Status PENDING  Incomplete  Culture, blood (Routine X 2) w Reflex to ID Panel     Status: None (Preliminary result)   Collection Time: 11/09/16  7:20 PM  Result Value Ref Range Status   Specimen Description BLOOD LEFT HAND  Final   Special Requests   Final    BOTTLES DRAWN AEROBIC ONLY Blood Culture adequate volume   Culture NO GROWTH 4 DAYS  Final   Report Status PENDING  Incomplete  MRSA PCR Screening     Status: None   Collection Time: 11/10/16  2:01 AM  Result Value Ref Range Status   MRSA by PCR NEGATIVE NEGATIVE Final    Comment:        The GeneXpert MRSA Assay (FDA approved for NASAL specimens only), is one component of a comprehensive MRSA colonization surveillance program. It is  not intended to diagnose MRSA infection nor to guide or monitor treatment for MRSA infections.   Urine Culture     Status: None   Collection Time: 11/10/16  9:41 AM  Result Value Ref Range Status   Specimen Description URINE, CATHETERIZED  Final   Special Requests NONE  Final   Culture NO GROWTH  Final   Report Status 11/11/2016 FINAL  Final  Body fluid culture     Status: None   Collection Time: 11/10/16  1:39 PM  Result Value Ref Range Status   Specimen Description FLUID KIDNEY RIGHT  Final   Special Requests NONE  Final   Gram Stain   Final    ABUNDANT WBC PRESENT, PREDOMINANTLY MONONUCLEAR MODERATE GRAM NEGATIVE RODS    Culture   Final    ABUNDANT ESCHERICHIA COLI Confirmed Extended Spectrum Beta-Lactamase Producer (ESBL)    Report Status 11/13/2016 FINAL  Final   Organism ID, Bacteria ESCHERICHIA COLI  Final      Susceptibility   Escherichia coli - MIC*    AMPICILLIN >=32 RESISTANT Resistant     CEFAZOLIN >=64 RESISTANT Resistant     CEFEPIME >=64 RESISTANT Resistant     CEFTAZIDIME 16 INTERMEDIATE Intermediate     CEFTRIAXONE >=64 RESISTANT Resistant     CIPROFLOXACIN >=4 RESISTANT Resistant     GENTAMICIN <=1 SENSITIVE Sensitive     IMIPENEM <=0.25 SENSITIVE Sensitive     TRIMETH/SULFA >=320 RESISTANT Resistant     AMPICILLIN/SULBACTAM >=32 RESISTANT Resistant     PIP/TAZO 16 SENSITIVE Sensitive     Extended ESBL POSITIVE Resistant     * ABUNDANT ESCHERICHIA COLI    Studies/Results: Dg Chest Port 1 View  Result Date: 11/12/2016 CLINICAL DATA:  Shortness of Breath EXAM: PORTABLE CHEST 1 VIEW COMPARISON:  November 09, 2016 FINDINGS: There are bilateral pleural effusions with moderate interstitial edema. There is mild cardiac prominence with pulmonary venous hypertension. There is atelectasis in lung bases without frank airspace consolidation. There is a total shoulder replacement on the right. IMPRESSION: Findings indicative of congestive heart failure with  slightly more edema than on recent study. Atelectasis in the bases without frank airspace consolidation. Electronically Signed   By: Lowella Grip III M.D.   On: 11/12/2016 09:48     Assessment/Plan: Transitoinal Cell CA of R kidney Obstructive uropathy ESBL E coli UTI Femur fracture with L IM nail on 9-4  Total days of antibiotics: 15 merrem   Her Cr is much better WBC is better today She's been afebrile.  No change in merrem. Would aim for 21 days of merrem provided she is afebrile and her WBC is stable.  Consider having her femur wound eval by ortho.           Bobby Rumpf Infectious Diseases (pager) [phone] [email or web address] 11/14/2016, 9:22 AM  LOS: 16 days

## 2016-11-14 NOTE — Progress Notes (Signed)
  [facility] Physician Progress Note and Electrolyte Replacement  Patient Name: Isabel Collins DOB: 1940-10-10 MRN: [id number]  Date of Service  11/14/2016   HPI/Events of Note    Recent Labs Lab 11/08/16 1046  11/10/16 0211 11/11/16 0325 11/12/16 0351 11/13/16 0331 11/14/16 0710  NA 147*  < > 148* 144 143 142 142  K 4.9  < > 5.1 4.5 4.3 4.7 3.7  CL 120*  < > 115* 109 110 108 117*  CO2 17*  < > 20* 25 25 24  18*  GLUCOSE 111*  < > 94 109* 71 90 80  BUN 34*  < > 42* 40* 37* 39* 33*  CREATININE 1.44*  < > 1.55* 1.37* 1.31* 1.51* 1.08*  CALCIUM 8.0*  < > 8.4* 8.0* 8.1* 8.2* 6.4*  MG 2.9*  --   --   --  2.8* 2.8* 2.1  PHOS  --   --   --  3.7  --  4.3 3.4  < > = values in this interval not displayed.  Estimated Creatinine Clearance: 46.2 mL/min (A) (by C-G formula based on SCr of 1.08 mg/dL (H)).  Intake/Output      09/12 0701 - 09/13 0700   IV Piggyback 200   Total Intake(mL/kg) 200 (2.2)   Urine (mL/kg/hr) 395 (0.3)   Total Output 395   Net -195        - I/O DETAILED x 24h    No intake/output data recorded. - I/O THIS SHIFT    ASSESSMENT Patient on lasix gtt AM KCL 3.7   eICURN Interventions  repelte 32meq kcl via tube   ASSESSMENT: MAJOR ELECTROLYTE      Dr. Brand Males, M.D., [facility].C.P Pulmonary and Critical Care Medicine Staff Physician [facility] Pager: [phone], If no answer or between  15:00h - 7:00h: call [phone]  11/14/2016 7:36 PM

## 2016-11-14 NOTE — Progress Notes (Signed)
Pharmacy Antibiotic Note  Isabel Collins is a 76 y.o. female admitted on 10/18/2016 with ESBL UTI.  Pharmacy has been consulted for meropenem dosing. SCr improved 1.7>1.1, low UOP, s/p R nephrostomy tube placement -  ESBL bacteremia, fuid-right kidney, WBC improving, afebrile despite elevated PCT  Plan: -Continue meropenem 1g IV q12h - continue for now - until ECHO assessing valves Day #15   Height: 5\' 2"  (157.5 cm) Weight: 204 lb (92.5 kg) IBW/kg (Calculated) : 50.1  Temp (24hrs), Avg:98.1 F (36.7 C), Min:97.6 F (36.4 C), Max:99 F (37.2 C)   Recent Labs Lab 11/10/16 0211 11/10/16 1921 11/11/16 0325 11/11/16 0818 11/12/16 0351 11/12/16 0820 11/12/16 1108 11/12/16 1758 11/13/16 0331 11/13/16 0642 11/14/16 0531 11/14/16 0710  WBC 20.5*  --  15.3*  --  13.5*  --   --   --   --  18.9* 15.4*  --   CREATININE 1.55*  --  1.37*  --  1.31*  --   --   --  1.51*  --   --  1.08*  LATICACIDVEN  --  3.1*  --  2.8*  --  2.2* 2.4* 2.3*  --   --   --   --     Estimated Creatinine Clearance: 46.2 mL/min (A) (by C-G formula based on SCr of 1.08 mg/dL (H)).    No Known Allergies  Antimicrobials this admission: 8/27 meropenem >>  8/27 vancomycin x1  Dose adjustments this admission: none  Microbiology results: 8/27 BCx: ESBL E.coli 8/27 UCx: ESBL. E.coli  8/27 MRSA PCR: neg 8/29 BCx: NGTD x5 9/5 urine: neg 9/7 BCx: NGTD 9/8 UCx: neg 9/8 kidney fluid Cx: ESBL   Bonnita Nasuti Pharm.D. CPP, BCPS Clinical Pharmacist [phone] 11/14/2016 9:27 AM

## 2016-11-14 NOTE — Progress Notes (Signed)
  Echocardiogram 2D Echocardiogram has been performed.  [name] T Rosalie Buenaventura 11/14/2016, 9:58 AM

## 2016-11-14 NOTE — Progress Notes (Signed)
PULMONARY / CRITICAL CARE MEDICINE   Name: Isabel Collins MRN: [id number] DOB: [birth year]    ADMISSION DATE:  10/23/2016   CONSULTATION DATE:  11/10/2016  REFERRING MD:  D. Grandville Silos, M.D. / [facility]  CHIEF COMPLAINT: Sepsis  HISTORY OF PRESENT ILLNESS:  76 y.o. 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 Patient underwent right ureteral stent placement on 6/25 and was subsequently scheduled for nephrectomy on 8/30 but had a fall with left femur fracture that was nailed on 9/4. Patient has developed an ESBL Escherichia coli infection with bacteremia. She did require nephrostomy tube placement on the right on 9/8. PCCM was asked to consult on 9/8 with tenuous medical status & multiple medical problems.  SUBJECTIVE:   No acute events overnight. Transitioned back to BiPAP overnight. Still marginal urine output with positive volume status. Patient complaining of bloating and feeling as though she needs to have a bowel movement. Denies any chest pain or pressure. Denies any nausea. Denies any subjective fever or chills. Patient is still somewhat confused regarding her current place. Mentation seems to be improving. Denies any dyspnea on BiPAP.  REVIEW OF SYSTEMS:   unable to obtain with mildly altered mentation today.  VITAL SIGNS: BP (!) 108/52   Pulse (!) 101   Temp 97.8 F (36.6 C) (Oral)   Resp (!) 22   Ht 5\' 2"  (1.575 m)   Wt 204 lb (92.5 kg)   SpO2 96%   BMI 37.31 kg/m   HEMODYNAMICS:    VENTILATOR SETTINGS: FiO2 (%):  [40 %] 40 %  INTAKE / OUTPUT: I/O last 3 completed shifts: In: 3290.4 [I.V.:3040.4; IV Piggyback:250] Out: 553 [Urine:965]  PHYSICAL EXAMINATION: General:  Awake. Undergoing echocardiogram. No distress. Integument:  Warm. Dry. No rash on skin exposed.Marland Kitchen  HEENT: BiPAP mask in place. No scleral icterus. Cardiovascular:  Mildly tachycardic. Sinus rhythm. Anasarca persists. Pulmonary:  Good aeration bilateral lungs on  BiPAP. Normal work of breathing on BiPAP. Abdomen: Protuberant. Soft. Normal bowel sounds.  Neurological: Oriented to president and year. Believes she has an [place] all 4 extremities.  LABS:  BMET  Recent Labs Lab 11/12/16 0351 11/13/16 0331 11/14/16 0710  NA 143 142 142  K 4.3 4.7 3.7  CL 110 108 117*  CO2 25 24 18*  BUN 37* 39* 33*  CREATININE 1.31* 1.51* 1.08*  GLUCOSE 71 90 80    Electrolytes  Recent Labs Lab 11/11/16 0325 11/12/16 0351 11/13/16 0331 11/14/16 0710  CALCIUM 8.0* 8.1* 8.2* 6.4*  MG  --  2.8* 2.8* 2.1  PHOS 3.7  --  4.3 3.4    CBC  Recent Labs Lab 11/12/16 0351 11/13/16 0642 11/14/16 0531  WBC 13.5* 18.9* 15.4*  HGB 7.5* 8.8* 7.3*  HCT 25.7* 28.9* 24.8*  PLT 131* 162 128*    Coag's No results for input(s): APTT, INR in the last 168 hours.  Sepsis Markers  Recent Labs Lab 11/12/16 0820 11/12/16 1108 11/12/16 1758 11/13/16 0331 11/14/16 0710  LATICACIDVEN 2.2* 2.4* 2.3*  --   --   PROCALCITON  --  0.32  --  0.65 2.69    ABG  Recent Labs Lab 11/09/16 2105 11/12/16 0945  PHART 7.456* 7.431  PCO2ART 33.7 37.5  PO2ART 119* 62.8*    Liver Enzymes  Recent Labs Lab 11/07/16 0950 11/08/16 1046 11/11/16 0325 11/13/16 0331 11/14/16 0710  AST 69* 62*  --   --   --   ALT 12* 11*  --   --   --  ALKPHOS 94 104  --   --   --   BILITOT 0.4 0.7  --   --   --   ALBUMIN 1.4* 1.6* 2.2* 2.8* 2.0*    Cardiac Enzymes No results for input(s): TROPONINI, PROBNP in the last 168 hours.  Glucose  Recent Labs Lab 11/13/16 1150 11/13/16 1622 11/13/16 2008 11/13/16 2316 11/14/16 0349 11/14/16 0748  GLUCAP 95 97 90 83 94 78    Imaging No results found.  IMAGING/STUDIES: PORT CXR 9/10:  Previously reviewed by me. Worsening bilateral lung opacification compared with earlier chest x-ray imaging. TTE 9/12 >>>  MICROBIOLOGY: Blood Cultures x2 8/12:  Negative Blood Cultures x2 8/27:  1/2 Positive  ESBL E coli  Urine Culture 8/27:  ESBL E coli  MRSA PCR 8/27:  Negative  Blood Cultures x2 8/29:  Negative  Urine Culture 9/5: Negative  Blood Cultures x2 9/7 >>> MRSA PCR 9/8:  Negative  Body Fluid Culture 9/8:  E coli   ANTIBIOTICS: Merrem 8/27 >>>  SIGNIFICANT EVENTS: 08/27 - Admit 09/08 - Right perc nephrostomy tube placement 09/10 - Transferred to ICU with worsening respiratory status 09/12 - Transitioned back to BiPAP overnight from [place] 5 L/m  ASSESSMENT / PLAN:  76 y.o. female with bacteremia and acute hypoxic respiratory failure. Respiratory status remains tenuous. Patient did require BiPAP overnight. Mentation seems to be steadily improving. I'm concerned regarding the patient's escalating Procalcitonin but at the same time she has an improving white blood cell count as well as no fever. Continuing to require low-dose Neo-Synephrine infusion.  1. Acute hypoxic respiratory failure: Continuing BiPAP as needed for work of breathing. Weaning FiO2. Continuous pulse oximetry monitoring. Starting Lasix drip at 5 mg per hour for diuresis. 2. Sepsis: Secondary to Escherichia coli bacteremia. Awaiting finalization of repeat blood cultures. 3. Escherichia coli bacteremia: Continuing meropenem day #15. Awaiting echocardiogram result. Plan reculture for fever. 4. Shock: Likely multifactorial with intravascular volume depletion. Echocardiogram pending. Continuing to wean Neo-Synephrine. 5. Acute encephalopathy: Steadily improving. Likely secondary to toxic metabolic and hypoxia. Continuing to avoid sedating medications. 6. Obstructive uropathy: Secondary to urothelial carcinoma. Status post percutaneous nephrostomy tube. 7. Lactic acidosis: Likely secondary to sepsis and poor clearance from acute renal failure. Continuing to trend lactic acid daily. 8. Acute renal failure: Improving. Patient has known chronic renal failure. Monitoring urine output with Foley catheter with  diuresis. 9. Anemia/thrombocytopenia: Hemoglobin slightly worse today. No visible signs of active bleeding. Trending cell counts daily. 10. Left hip fracture: Status post surgery by orthopedics.  11. Hypocalcemia: Calcium gluconate 1 g IV now. 12. Moderate to severe protein-calorie malnutrition: Continuing dextrose infusion. Dysphagia diet per speech.  Prophylaxis:  SCDs.  Diet:  Cleared by speech for Dysphagia 1 diet. Code Status:  Full Code per previous physician discussions. Disposition:  Remains critically ill in the ICU. Family Update:  No family at bedside during rounds. Patient updated during rounds.  I have personally spent a total of 31 minutes of critical care time today caring for the patient & reviewing the patient's electronic medical record.  Sonia Baller Ashok Cordia, M.D. [facility] Pulmonary & Critical Care Pager:  [phone] After 3pm or if no response, call [phone] 11/14/2016 9:06 AM

## 2016-11-14 NOTE — Progress Notes (Signed)
  Speech Language Pathology Treatment: Dysphagia  Patient Details Name: Isabel Collins MRN: [id number] DOB: April 02, 1940 Today's Date: 11/14/2016 Time: 7494-4967 SLP Time Calculation (min) (ACUTE ONLY): 17 min  Assessment / Plan / Recommendation Clinical Impression  Pt appears safe with puree texture and thin liquids via straw. She has no upper dentition and agrees that upgraded texture would be difficult given her lack of dentition and decreased endurance. Will follow up end of this week.    HPI HPI: Pt is a 76 yo female admitted s/p fall with a traumatic closed displaced supracondylar fracture of left femur, s/p IM nail 9/5.  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, HH, GERD      SLP Plan  Continue with current plan of care       Recommendations  Diet recommendations: Dysphagia 1 (puree);Thin liquid Liquids provided via: Cup;Straw Medication Administration: Crushed with puree Supervision: Patient able to self feed;Full supervision/cueing for compensatory strategies Compensations: Slow rate;Small sips/bites;Lingual sweep for clearance of pocketing Postural Changes and/or Swallow Maneuvers: Seated upright 90 degrees;Upright 30-60 min after meal                Oral Care Recommendations: Oral care BID Follow up Recommendations: Skilled Nursing facility SLP Visit Diagnosis: Dysphagia, oral phase (R13.11) Plan: Continue with current plan of care       GO                Houston Siren 11/14/2016, 1:43 PM  Orbie Pyo Welcome Fults M.Ed [employer] [phone]

## 2016-11-15 ENCOUNTER — Encounter (HOSPITAL_COMMUNITY): Payer: Self-pay | Admitting: Interventional Radiology

## 2016-11-15 ENCOUNTER — Inpatient Hospital Stay (HOSPITAL_COMMUNITY): Payer: Medicare Other

## 2016-11-15 LAB — CBC WITH DIFFERENTIAL/PLATELET
BASOS ABS: 0 10*3/uL (ref 0.0–0.1)
BASOS PCT: 0 %
EOS PCT: 4 %
Eosinophils Absolute: 0.7 10*3/uL (ref 0.0–0.7)
HCT: 27.6 % — ABNORMAL LOW (ref 36.0–46.0)
Hemoglobin: 8.2 g/dL — ABNORMAL LOW (ref 12.0–15.0)
Lymphocytes Relative: 11 %
Lymphs Abs: 1.9 10*3/uL (ref 0.7–4.0)
MCH: 25.7 pg — ABNORMAL LOW (ref 26.0–34.0)
MCHC: 29.7 g/dL — ABNORMAL LOW (ref 30.0–36.0)
MCV: 86.5 fL (ref 78.0–100.0)
MONO ABS: 1.8 10*3/uL — AB (ref 0.1–1.0)
Monocytes Relative: 10 %
Neutro Abs: 13.6 10*3/uL — ABNORMAL HIGH (ref 1.7–7.7)
Neutrophils Relative %: 75 %
PLATELETS: 191 10*3/uL (ref 150–400)
RBC: 3.19 MIL/uL — ABNORMAL LOW (ref 3.87–5.11)
RDW: 19.3 % — AB (ref 11.5–15.5)
WBC: 18.1 10*3/uL — ABNORMAL HIGH (ref 4.0–10.5)

## 2016-11-15 LAB — RENAL FUNCTION PANEL
Albumin: 2.3 g/dL — ABNORMAL LOW (ref 3.5–5.0)
Anion gap: 10 (ref 5–15)
BUN: 34 mg/dL — ABNORMAL HIGH (ref 6–20)
CALCIUM: 7.8 mg/dL — AB (ref 8.9–10.3)
CO2: 22 mmol/L (ref 22–32)
Chloride: 107 mmol/L (ref 101–111)
Creatinine, Ser: 1.24 mg/dL — ABNORMAL HIGH (ref 0.44–1.00)
GFR calc Af Amer: 47 mL/min — ABNORMAL LOW (ref >60)
GFR calc non Af Amer: 41 mL/min — ABNORMAL LOW (ref >60)
GLUCOSE: 86 mg/dL (ref 65–99)
Phosphorus: 4 mg/dL (ref 2.5–4.6)
Potassium: 3.9 mmol/L (ref 3.5–5.1)
SODIUM: 139 mmol/L (ref 135–145)

## 2016-11-15 LAB — GLUCOSE, CAPILLARY
GLUCOSE-CAPILLARY: 78 mg/dL (ref 65–99)
GLUCOSE-CAPILLARY: 100 mg/dL — AB (ref 65–99)
Glucose-Capillary: 82 mg/dL (ref 65–99)
Glucose-Capillary: 83 mg/dL (ref 65–99)
Glucose-Capillary: 89 mg/dL (ref 65–99)
Glucose-Capillary: 102 mg/dL — ABNORMAL HIGH (ref 65–99)

## 2016-11-15 LAB — MAGNESIUM: Magnesium: 2.3 mg/dL (ref 1.7–2.4)

## 2016-11-15 LAB — LACTIC ACID, PLASMA
LACTIC ACID, VENOUS: 3.1 mmol/L — AB (ref 0.5–1.9)
LACTIC ACID, VENOUS: 3.1 mmol/L — AB (ref 0.5–1.9)

## 2016-11-15 MED ORDER — MIDODRINE HCL 5 MG PO TABS
2.5000 mg | ORAL_TABLET | Freq: Three times a day (TID) | ORAL | Status: DC
  Administered 2016-11-15 – 2016-11-17 (×5): 2.5 mg via ORAL
  Filled 2016-11-15 (×5): qty 1

## 2016-11-15 NOTE — Progress Notes (Signed)
Patient placed back on BiPAP by RN for shortness of breath and slight respiratory distress.

## 2016-11-15 NOTE — Progress Notes (Signed)
Called critical lactic acid of 3.1 at 0521 to e-link physician. Result read back. No orders received.

## 2016-11-15 NOTE — Progress Notes (Signed)
PULMONARY / CRITICAL CARE MEDICINE   Name: Isabel Collins MRN: [id number] DOB: [birth year]    ADMISSION DATE:  10/08/2016   CONSULTATION DATE:  11/10/2016  REFERRING MD:  D. Grandville Silos, M.D. / [facility]  CHIEF COMPLAINT: Sepsis  HISTORY OF PRESENT ILLNESS:  76 y.o. 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 Patient underwent right ureteral stent placement on 6/25 and was subsequently scheduled for nephrectomy on 8/30 but had a fall with left femur fracture that was nailed on 9/4. Patient has developed an ESBL Escherichia coli infection with bacteremia. She did require nephrostomy tube placement on the right on 9/8. PCCM was asked to consult on 9/8 with tenuous medical status & multiple medical problems.  SUBJECTIVE:   No acute events overnight. Off bipap this am.  Feels breathing is better.  C/o urinary catheter.  Denies chest pain, nausea, abd pain, chest pain.    VITAL SIGNS: BP 92/61   Pulse (!) 121   Temp 98 F (36.7 C) (Oral)   Resp (!) 22   Ht 5\' 2"  (1.575 m)   Wt 93.9 kg (207 lb)   SpO2 93%   BMI 37.86 kg/m   HEMODYNAMICS:    VENTILATOR SETTINGS: FiO2 (%):  [35 %-40 %] 40 %  INTAKE / OUTPUT: I/O last 3 completed shifts: In: 3239.9 [I.V.:2939.9; IV Piggyback:300] Out: 1610 [Urine:1610]  PHYSICAL EXAMINATION: General:  Chronically ill appearing female, NAD, Awake Integument:  Warm. Dry. No rash on skin exposed.Marland Kitchen  HEENT: mm moist, venti mask in place. No scleral icterus. Cardiovascular:  Mildly tachycardic. Sinus rhythm. Anasarca persists. Pulmonary:  resps even non labored on venti mask, some dyspnea while talking but comfortable at rest. Lungs essentially clear.  Abdomen: Protuberant. Soft. Normal bowel sounds.  Neurological: awake, alert, mild confusion, Moving all 4 extremities.  LABS:  BMET  Recent Labs Lab 11/13/16 0331 11/14/16 0710 11/15/16 0335  NA 142 142 139  K 4.7 3.7 3.9  CL 108 117* 107  CO2 24 18* 22   BUN 39* 33* 34*  CREATININE 1.51* 1.08* 1.24*  GLUCOSE 90 80 86    Electrolytes  Recent Labs Lab 11/13/16 0331 11/14/16 0710 11/15/16 0335  CALCIUM 8.2* 6.4* 7.8*  MG 2.8* 2.1 2.3  PHOS 4.3 3.4 4.0    CBC  Recent Labs Lab 11/13/16 0642 11/14/16 0531 11/15/16 0335  WBC 18.9* 15.4* 18.1*  HGB 8.8* 7.3* 8.2*  HCT 28.9* 24.8* 27.6*  PLT 162 128* 191    Coag's No results for input(s): APTT, INR in the last 168 hours.  Sepsis Markers  Recent Labs Lab 11/12/16 1108 11/12/16 1758 11/13/16 0331 11/14/16 0710 11/15/16 0335  LATICACIDVEN 2.4* 2.3*  --   --  3.1*  PROCALCITON 0.32  --  0.65 2.69  --     ABG  Recent Labs Lab 11/09/16 2105 11/12/16 0945  PHART 7.456* 7.431  PCO2ART 33.7 37.5  PO2ART 119* 62.8*    Liver Enzymes  Recent Labs Lab 11/08/16 1046  11/13/16 0331 11/14/16 0710 11/15/16 0335  AST 62*  --   --   --   --   ALT 11*  --   --   --   --   ALKPHOS 104  --   --   --   --   BILITOT 0.7  --   --   --   --   ALBUMIN 1.6*  < > 2.8* 2.0* 2.3*  < > = values in this interval not  displayed.  Cardiac Enzymes No results for input(s): TROPONINI, PROBNP in the last 168 hours.  Glucose  Recent Labs Lab 11/14/16 1144 11/14/16 1652 11/14/16 2013 11/14/16 2322 11/15/16 0337 11/15/16 0848  GLUCAP 79 81 82 87 83 78    Imaging No results found.  IMAGING/STUDIES: PORT CXR 9/10:  Previously reviewed by me. Worsening bilateral lung opacification compared with earlier chest x-ray imaging. TTE 9/12 >>>EF 96-78%, grade 1 diastolic dysfunction, trivial MR, no vegetation  MICROBIOLOGY: Blood Cultures x2 8/12:  Negative Blood Cultures x2 8/27:  1/2 Positive ESBL E coli  Urine Culture 8/27:  ESBL E coli  MRSA PCR 8/27:  Negative  Blood Cultures x2 8/29:  Negative  Urine Culture 9/5: Negative  Blood Cultures x2 9/7 >>>NEG MRSA PCR 9/8:  Negative  Body Fluid Culture 9/8:  E coli   ANTIBIOTICS: Merrem 8/27 >>>  SIGNIFICANT  EVENTS: 08/27 - Admit 09/08 - Right perc nephrostomy tube placement 09/10 - Transferred to ICU with worsening respiratory status 09/12 - Transitioned back to BiPAP overnight from [place] 5 L/m  ASSESSMENT / PLAN:  76 y.o. female with bacteremia and acute hypoxic respiratory failure. Respiratory status remains tenuous but improving slowly.  Able to be off bipap for longer periods.  Mentation seems to be steadily improving. Escalating Procalcitonin and elevated lactate is concerning but at the same time she has an improving white blood cell count as well as no fever and otherwise seems to be improving clinically with better respiratory status and mentation. Continuing to require low-dose Neo-Synephrine infusion.  1. Acute hypoxic respiratory failure: Continuing BiPAP as needed for work of breathing. Weaning FiO2. Continuous pulse oximetry monitoring. Very little diuresis with lasix gtt.  However, Will not escalate lasix at this time given slight bump in Scr and hypotension.  Monitor Scr slowly with slight bump overnight.  2. Sepsis: Secondary to Escherichia coli bacteremia. Repeat blood cultures NEG. NO evidence vegetation on echo.  3. Escherichia coli bacteremia: Continuing meropenem day #16. Plan reculture for fever.  NO evidence vegetation on echo.  4. Shock: Likely multifactorial with intravascular volume depletion.  Continuing to wean Neo-Synephrine. 5. Acute encephalopathy: Steadily improving. Likely secondary to toxic metabolic and hypoxia. Continuing to avoid sedating medications. 6. Obstructive uropathy: Secondary to urothelial carcinoma. Status post percutaneous nephrostomy tube. 7. Lactic acidosis: Likely secondary to sepsis and poor clearance from acute renal failure. Continuing to trend lactic acid daily. 8. Acute renal failure: Improving. Patient has known chronic renal failure. Monitoring urine output with Foley catheter with diuresis. 9. Anemia/thrombocytopenia: Hemoglobin improved. No  visible signs of active bleeding. Trending cell counts daily. 10. Left hip fracture: Status post surgery by orthopedics.  11. Hypocalcemia: f/u chem 12. Moderate to severe protein-calorie malnutrition: Continuing dextrose infusion. Dysphagia diet per speech when able to tolerate from respiratory standpoint    Prophylaxis:  SCDs.  Diet:  Cleared by speech for Dysphagia 1 diet. Code Status:  Full Code per previous physician discussions. Disposition:  Remains critically ill in the ICU. Family Update:  No family at bedside during rounds 9/13.   Nickolas Madrid, NP 11/15/2016  9:35 AM Pager: [phone] or [phone]

## 2016-11-15 NOTE — Progress Notes (Signed)
OT Cancellation Note  Patient Details Name: Isabel Collins MRN: [id number] DOB: Jan 11, 1941   Cancelled Treatment:    Reason Eval/Treat Not Completed: Medical issues which prohibited therapy. Pt hypotensive with elevated HR, on pressors and bipap. Will follow.  Malka So 11/15/2016, 12:31 PM\ [phone]

## 2016-11-15 NOTE — Progress Notes (Addendum)
Elink MD Byrum notified of increasing vaspressor support, current output, inconsistent cuff bp readings, and current  vital signs. Orders received. Will continue to monitor closely. Eleonore Chiquito RN 2 Heart

## 2016-11-15 NOTE — Care Management Important Message (Signed)
Important Message  Patient Details  Name: SORAYAH SCHRODT MRN: [id number] Date of Birth: February 08, 1941   Medicare Important Message Given:  Yes    Zenon Mayo, RN 11/15/2016, 11:24 AMImportant Message  Patient Details  Name: EVANI SHRIDER MRN: [id number] Date of Birth: 1940-12-04   Medicare Important Message Given:  Yes    Zenon Mayo, RN 11/15/2016, 11:24 AM

## 2016-11-15 NOTE — Progress Notes (Signed)
PT Cancellation Note  Patient Details Name: Isabel Collins MRN: [id number] DOB: 12-Dec-1940   Cancelled Treatment:    Reason Eval/Treat Not Completed: Medical issues which prohibited therapy (BP 70/30, HR 130.  On pressors. Also on Bipap currently.  Nurse asked to HOLD.) Will check back tomorrow.  Thanks.    Godfrey Pick Ryanne Morand 11/15/2016, 11:30 AM [facility] [phone] (pager)

## 2016-11-15 NOTE — Progress Notes (Signed)
INFECTIOUS DISEASE PROGRESS NOTE  ID: Isabel Collins is a 76 y.o. female with  Principal Problem:   Acute respiratory distress Active Problems:   Hypertension   Physical deconditioning   Hypothyroidism   Chronic obstructive pulmonary disease (HCC)   Type 2 diabetes mellitus with complication, without long-term current use of insulin (HCC)   Urothelial carcinoma of kidney, right (HCC)   Gastroesophageal reflux disease   History of recent fall   Right renal mass   Fracture of knee prosthesis (HCC)   AKI (acute kidney injury) ([place])   ESBL (extended spectrum beta-lactamase) producing bacteria infection   Closed comminuted supracondylar fracture of left femur (HCC)   Renal failure   Fall   Sepsis due to Escherichia coli (E. coli) (HCC)   Gram-negative bacteremia   Sepsis secondary to UTI ([place])   Transitional cell carcinoma (HCC)   Hypervolemia   HCAP (healthcare-associated pneumonia)   Leukocytosis   Encephalopathy acute   Hydronephrosis of right kidney   Abnormal CT scan, pelvis: 11/09/2016   Closed fracture of left distal femur (HCC)   Pleural effusion on right   Hypoalbuminemia   Acute renal failure (ARF) (HCC)   Acute respiratory failure with hypoxia (HCC)  Subjective: On CPAP, awake and alert.   Abtx:  Anti-infectives    Start     Dose/Rate Route Frequency Ordered Stop   11/07/16 0900  ceFAZolin (ANCEF) IVPB 2g/100 mL premix  Status:  Discontinued     2 g 200 mL/hr over 30 Minutes Intravenous To Short Stay 11/07/16 0809 11/07/16 0934   11/02/16 0700  ceFAZolin (ANCEF) IVPB 2g/100 mL premix     2 g 200 mL/hr over 30 Minutes Intravenous To Short Stay 10/30/16 1212 11/03/16 0700   11/02/16 0700  gentamicin (GARAMYCIN) 280 mg in dextrose 5 % 100 mL IVPB  Status:  Discontinued     5 mg/kg  56.2 kg (Adjusted) 107 mL/hr over 60 Minutes Intravenous 30 min pre-op 11/01/16 0932 11/02/16 1006   10/30/16 1000  fluconazole (DIFLUCAN) tablet 100 mg  Status:  Discontinued      100 mg Oral Daily 11/02/2016 1841 11/12/16 0926   10/30/16 0600  ceFAZolin (ANCEF) IVPB 2g/100 mL premix  Status:  Discontinued     2 g 200 mL/hr over 30 Minutes Intravenous On call to O.R. 10/27/2016 1925 10/30/16 1212   10/30/16 0500  meropenem (MERREM) 1 g in sodium chloride 0.9 % 100 mL IVPB     1 g 200 mL/hr over 30 Minutes Intravenous Every 12 hours 10/13/2016 1632     10/06/2016 1630  meropenem (MERREM) 1 g in sodium chloride 0.9 % 100 mL IVPB     1 g 200 mL/hr over 30 Minutes Intravenous STAT 10/15/2016 1616 10/31/2016 1919   10/10/2016 1545  vancomycin (VANCOCIN) IVPB 1000 mg/200 mL premix     1,000 mg 200 mL/hr over 60 Minutes Intravenous  Once 10/23/2016 1543 10/28/2016 1727   10/24/2016 1545  piperacillin-tazobactam (ZOSYN) IVPB 3.375 g  Status:  Discontinued     3.375 g 100 mL/hr over 30 Minutes Intravenous  Once 10/06/2016 1543 10/17/2016 1546      Medications:  Scheduled: . atorvastatin  40 mg Oral QHS  . budesonide (PULMICORT) nebulizer solution  0.5 mg Nebulization BID  . chlorhexidine gluconate (MEDLINE KIT)  15 mL Mouth Rinse BID  . Chlorhexidine Gluconate Cloth  6 each Topical Daily  . cycloSPORINE  1 drop Both Eyes BID  . feeding supplement (ENSURE ENLIVE)  237 mL Oral BID BM  . ipratropium-albuterol  3 mL Nebulization Q6H  . mouth rinse  15 mL Mouth Rinse QID  . sodium chloride flush  10-40 mL Intracatheter Q12H    Objective: Vital signs in last 24 hours: Temp:  [97.4 F (36.3 C)-98.4 F (36.9 C)] 98 F (36.7 C) (09/13 0800) Pulse Rate:  [121-134] 134 (09/13 1042) Resp:  [16-29] 27 (09/13 1042) BP: (65-128)/(37-98) 89/72 (09/13 1042) SpO2:  [89 %-100 %] 95 % (09/13 1042) FiO2 (%):  [35 %-40 %] 40 % (09/13 1042) Weight:  [93.9 kg (207 lb)] 93.9 kg (207 lb) (09/13 0515)   General appearance: alert, cooperative and moderate distress Resp: rhonchi anterior - bilateral Cardio: tachycardia GI: normal findings: bowel sounds normal and soft, non-tender Extremities:  anasarca. R leg wounds are clean, intact. some clear fluid from proximal.  Lab Results  Recent Labs  11/14/16 0531 11/14/16 0710 11/15/16 0335  WBC 15.4*  --  18.1*  HGB 7.3*  --  8.2*  HCT 24.8*  --  27.6*  NA  --  142 139  K  --  3.7 3.9  CL  --  117* 107  CO2  --  18* 22  BUN  --  33* 34*  CREATININE  --  1.08* 1.24*   Liver Panel  Recent Labs  11/14/16 0710 11/15/16 0335  ALBUMIN 2.0* 2.3*   Sedimentation Rate No results for input(s): ESRSEDRATE in the last 72 hours. C-Reactive Protein No results for input(s): CRP in the last 72 hours.  Microbiology: Recent Results (from the past 240 hour(s))  Urine culture     Status: None   Collection Time: 11/07/16  8:10 AM  Result Value Ref Range Status   Specimen Description URINE, CLEAN CATCH  Final   Special Requests NONE  Final   Culture NO GROWTH  Final   Report Status 11/08/2016 FINAL  Final  Culture, blood (Routine X 2) w Reflex to ID Panel     Status: None   Collection Time: 11/09/16  7:02 PM  Result Value Ref Range Status   Specimen Description BLOOD RIGHT HAND  Final   Special Requests IN PEDIATRIC BOTTLE Blood Culture adequate volume  Final   Culture NO GROWTH 5 DAYS  Final   Report Status 11/14/2016 FINAL  Final  Culture, blood (Routine X 2) w Reflex to ID Panel     Status: None   Collection Time: 11/09/16  7:20 PM  Result Value Ref Range Status   Specimen Description BLOOD LEFT HAND  Final   Special Requests   Final    BOTTLES DRAWN AEROBIC ONLY Blood Culture adequate volume   Culture NO GROWTH 5 DAYS  Final   Report Status 11/14/2016 FINAL  Final  MRSA PCR Screening     Status: None   Collection Time: 11/10/16  2:01 AM  Result Value Ref Range Status   MRSA by PCR NEGATIVE NEGATIVE Final    Comment:        The GeneXpert MRSA Assay (FDA approved for NASAL specimens only), is one component of a comprehensive MRSA colonization surveillance program. It is not intended to diagnose MRSA infection nor  to guide or monitor treatment for MRSA infections.   Urine Culture     Status: None   Collection Time: 11/10/16  9:41 AM  Result Value Ref Range Status   Specimen Description URINE, CATHETERIZED  Final   Special Requests NONE  Final   Culture NO GROWTH  Final  Report Status 11/11/2016 FINAL  Final  Body fluid culture     Status: None   Collection Time: 11/10/16  1:39 PM  Result Value Ref Range Status   Specimen Description FLUID KIDNEY RIGHT  Final   Special Requests NONE  Final   Gram Stain   Final    ABUNDANT WBC PRESENT, PREDOMINANTLY MONONUCLEAR MODERATE GRAM NEGATIVE RODS    Culture   Final    ABUNDANT ESCHERICHIA COLI Confirmed Extended Spectrum Beta-Lactamase Producer (ESBL)    Report Status 11/13/2016 FINAL  Final   Organism ID, Bacteria ESCHERICHIA COLI  Final      Susceptibility   Escherichia coli - MIC*    AMPICILLIN >=32 RESISTANT Resistant     CEFAZOLIN >=64 RESISTANT Resistant     CEFEPIME >=64 RESISTANT Resistant     CEFTAZIDIME 16 INTERMEDIATE Intermediate     CEFTRIAXONE >=64 RESISTANT Resistant     CIPROFLOXACIN >=4 RESISTANT Resistant     GENTAMICIN <=1 SENSITIVE Sensitive     IMIPENEM <=0.25 SENSITIVE Sensitive     TRIMETH/SULFA >=320 RESISTANT Resistant     AMPICILLIN/SULBACTAM >=32 RESISTANT Resistant     PIP/TAZO 16 SENSITIVE Sensitive     Extended ESBL POSITIVE Resistant     * ABUNDANT ESCHERICHIA COLI    Studies/Results: US Renal  Result Date: 11/15/2016 CLINICAL DATA:  Right renal mass.  Acute renal failure. EXAM: RENAL / URINARY TRACT ULTRASOUND COMPLETE COMPARISON:  CT scan 11/09/2016 FINDINGS: Right Kidney: Length: 11.7 cm. Large complex 10 x 7.8 x 7.7 cm mass is again demonstrated. Moderate hydronephrosis. Double-J catheter in place. Left Kidney: Length: 10.7 cm. Normal renal cortical thickness and echogenicity without focal lesions or hydronephrosis. Bladder: Decompressed by a catheter. IMPRESSION: Large complex mass associated with the  right kidney with a double-J catheter in place. Moderate hydronephrosis. Normal left kidney. Electronically Signed   By: Marijo Sanes M.D.   On: 11/15/2016 09:56     Assessment/Plan: Transitoinal Cell CA of R kidney Obstructive uropathy ESBL E coli UTI Femur fracture with L IM nail on 9-4  Total days of antibiotics: 47mrrem  she continues to require neo continues to have R flank pain.  Isabel Collins Cr is stable WBC is slilghtly worse U/s reviewed. Would query if these findings are driving Isabel Collins WBC, need for neo.  She's been afebrile.  No change in merrem. Would aim for 21 days of merrem.          JBobby RumpfInfectious Diseases (pager) ([phone] 11/15/2016, 11:07 AM  LOS: 17 days

## 2016-11-16 ENCOUNTER — Inpatient Hospital Stay (HOSPITAL_COMMUNITY): Payer: Medicare Other

## 2016-11-16 LAB — GLUCOSE, CAPILLARY
GLUCOSE-CAPILLARY: 92 mg/dL (ref 65–99)
GLUCOSE-CAPILLARY: 86 mg/dL (ref 65–99)
Glucose-Capillary: 102 mg/dL — ABNORMAL HIGH (ref 65–99)
Glucose-Capillary: 83 mg/dL (ref 65–99)
Glucose-Capillary: 85 mg/dL (ref 65–99)
Glucose-Capillary: 90 mg/dL (ref 65–99)

## 2016-11-16 LAB — CBC WITH DIFFERENTIAL/PLATELET
BASOS PCT: 0 %
Basophils Absolute: 0 10*3/uL (ref 0.0–0.1)
Eosinophils Absolute: 0.4 10*3/uL (ref 0.0–0.7)
Eosinophils Relative: 2 %
HEMATOCRIT: 23.2 % — AB (ref 36.0–46.0)
HEMOGLOBIN: 7.2 g/dL — AB (ref 12.0–15.0)
Lymphocytes Relative: 8 %
Lymphs Abs: 1.5 10*3/uL (ref 0.7–4.0)
MCH: 27 pg (ref 26.0–34.0)
MCHC: 31 g/dL (ref 30.0–36.0)
MCV: 86.9 fL (ref 78.0–100.0)
MONOS PCT: 8 %
Monocytes Absolute: 1.5 10*3/uL — ABNORMAL HIGH (ref 0.1–1.0)
NEUTROS PCT: 82 %
Neutro Abs: 15 10*3/uL — ABNORMAL HIGH (ref 1.7–7.7)
Platelets: 169 10*3/uL (ref 150–400)
RBC: 2.67 MIL/uL — AB (ref 3.87–5.11)
RDW: 20 % — ABNORMAL HIGH (ref 11.5–15.5)
WBC: 18.4 10*3/uL — AB (ref 4.0–10.5)

## 2016-11-16 LAB — RENAL FUNCTION PANEL
ANION GAP: 10 (ref 5–15)
Albumin: 2.3 g/dL — ABNORMAL LOW (ref 3.5–5.0)
BUN: 36 mg/dL — AB (ref 6–20)
CALCIUM: 8.1 mg/dL — AB (ref 8.9–10.3)
CO2: 22 mmol/L (ref 22–32)
Chloride: 108 mmol/L (ref 101–111)
Creatinine, Ser: 1.49 mg/dL — ABNORMAL HIGH (ref 0.44–1.00)
GFR calc Af Amer: 38 mL/min — ABNORMAL LOW (ref >60)
GFR calc non Af Amer: 33 mL/min — ABNORMAL LOW (ref >60)
GLUCOSE: 91 mg/dL (ref 65–99)
PHOSPHORUS: 4.3 mg/dL (ref 2.5–4.6)
Potassium: 4.4 mmol/L (ref 3.5–5.1)
SODIUM: 140 mmol/L (ref 135–145)

## 2016-11-16 LAB — MAGNESIUM: MAGNESIUM: 2.4 mg/dL (ref 1.7–2.4)

## 2016-11-16 LAB — LACTIC ACID, PLASMA: Lactic Acid, Venous: 2.9 mmol/L (ref 0.5–1.9)

## 2016-11-16 MED ORDER — THIAMINE HCL 100 MG/ML IJ SOLN
500.0000 mg | Freq: Three times a day (TID) | INTRAVENOUS | Status: AC
  Administered 2016-11-16 – 2016-11-19 (×9): 500 mg via INTRAVENOUS
  Filled 2016-11-16 (×9): qty 5

## 2016-11-16 MED ORDER — HEPARIN SODIUM (PORCINE) 5000 UNIT/ML IJ SOLN
5000.0000 [IU] | Freq: Three times a day (TID) | INTRAMUSCULAR | Status: DC
  Administered 2016-11-16 – 2016-11-24 (×23): 5000 [IU] via SUBCUTANEOUS
  Filled 2016-11-16 (×25): qty 1

## 2016-11-16 NOTE — Progress Notes (Signed)
SLP Cancellation Note  Patient Details Name: Isabel Collins MRN: [id number] DOB: 06/08/1940   Cancelled treatment:        Attempted earlier this am and pt on Bipap. Will continue efforts.    Houston Siren 11/16/2016, 1:55 PM Orbie Pyo Colvin Caroli.[employer] [phone]

## 2016-11-16 NOTE — Progress Notes (Signed)
Elink MD notified about increasingly low UOP. Vital signs and patient condition relayed. No orders at this time. Will continue to monitor closely. Eleonore Chiquito RN 2 Heart

## 2016-11-16 NOTE — Progress Notes (Signed)
RT placed pt on BIPAP, 12/5, RR-12, 40%. Pt VS are stable and pt tolerating well. BBS- Clear and Diminished. Rt to cont to monitor.

## 2016-11-16 NOTE — Progress Notes (Signed)
PULMONARY / CRITICAL CARE MEDICINE   Name: Isabel Collins MRN: [id number] DOB: 1940-11-09    ADMISSION DATE:  11/01/2016   CONSULTATION DATE:  11/10/2016  REFERRING MD:  D. Grandville Silos, M.D. / [facility]  CHIEF COMPLAINT: Sepsis  HISTORY OF PRESENT ILLNESS:  76 y.o. 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 Patient underwent right ureteral stent placement on 6/25 and was subsequently scheduled for nephrectomy on 8/30 but had a fall with left femur fracture that was nailed on 9/4. Patient has developed an ESBL Escherichia coli infection with bacteremia. She did require nephrostomy tube placement on the right on 9/8. PCCM was asked to consult on 9/8 with tenuous medical status & multiple medical problems.  SUBJECTIVE:   BP Labile overnight , pressor needs increased ,Remains on BiPAP this am after very short period of time off . Asleep, arouses to call of name.   VITAL SIGNS: BP 97/62   Pulse (!) 118   Temp 99.4 F (37.4 C) (Oral)   Resp 20   Ht 5\' 2"  (1.575 m)   Wt 209 lb (94.8 kg)   SpO2 97%   BMI 38.23 kg/m   HEMODYNAMICS: CVP:  [10 mmHg] 10 mmHg  VENTILATOR SETTINGS: FiO2 (%):  [40 %-50 %] 40 %  INTAKE / OUTPUT: I/O last 3 completed shifts: In: 2197.8 [P.O.:300; I.V.:1597.8; IV Piggyback:300] Out: 920 [Urine:920]  PHYSICAL EXAMINATION: General:  Chronically ill appearing female, NAD, asleep but easy to arouse Integument:  Warm. Dry.Intact,  No rash on skin exposed.Marland Kitchen  HEENT: mm moist, BiPap in place . No scleral icterus. Cardiovascular:  Mild tachycardia. Sinus rhythm. Anasarca persists., No RMG Pulmonary:  resps even non labored on BiPAP . Lungs essentially clear.  Abdomen: Protuberant. Soft.Non-tender, Normal bowel sounds.  Neurological: awake, alert, mild confusion, Moving all 4 extremities.  LABS:  BMET  Recent Labs Lab 11/14/16 0710 11/15/16 0335 11/16/16 0540  NA 142 139 140  K 3.7 3.9 4.4  CL 117* 107 108  CO2 18*  22 22  BUN 33* 34* 36*  CREATININE 1.08* 1.24* 1.49*  GLUCOSE 80 86 91    Electrolytes  Recent Labs Lab 11/14/16 0710 11/15/16 0335 11/16/16 0540  CALCIUM 6.4* 7.8* 8.1*  MG 2.1 2.3 2.4  PHOS 3.4 4.0 4.3    CBC  Recent Labs Lab 11/14/16 0531 11/15/16 0335 11/16/16 0437  WBC 15.4* 18.1* 18.4*  HGB 7.3* 8.2* 7.2*  HCT 24.8* 27.6* 23.2*  PLT 128* 191 169    Coag's No results for input(s): APTT, INR in the last 168 hours.  Sepsis Markers  Recent Labs Lab 11/12/16 1108 11/12/16 1758 11/13/16 0331 11/14/16 0710 11/15/16 0335 11/15/16 1318  LATICACIDVEN 2.4* 2.3*  --   --  3.1* 3.1*  PROCALCITON 0.32  --  0.65 2.69  --   --     ABG  Recent Labs Lab 11/09/16 2105 11/12/16 0945  PHART 7.456* 7.431  PCO2ART 33.7 37.5  PO2ART 119* 62.8*    Liver Enzymes  Recent Labs Lab 11/14/16 0710 11/15/16 0335 11/16/16 0540  ALBUMIN 2.0* 2.3* 2.3*    Cardiac Enzymes No results for input(s): TROPONINI, PROBNP in the last 168 hours.  Glucose  Recent Labs Lab 11/15/16 0848 11/15/16 1113 11/15/16 1619 11/15/16 2014 11/16/16 0001 11/16/16 0409  GLUCAP 78 89 102* 100* 85 83    Imaging US Renal  Result Date: 11/15/2016 CLINICAL DATA:  Right renal mass.  Acute renal failure. EXAM: RENAL / URINARY TRACT ULTRASOUND  COMPLETE COMPARISON:  CT scan 11/09/2016 FINDINGS: Right Kidney: Length: 11.7 cm. Large complex 10 x 7.8 x 7.7 cm mass is again demonstrated. Moderate hydronephrosis. Double-J catheter in place. Left Kidney: Length: 10.7 cm. Normal renal cortical thickness and echogenicity without focal lesions or hydronephrosis. Bladder: Decompressed by a catheter. IMPRESSION: Large complex mass associated with the right kidney with a double-J catheter in place. Moderate hydronephrosis. Normal left kidney. Electronically Signed   By: Marijo Sanes M.D.   On: 11/15/2016 09:56   Dg Chest Portable 1 View  Result Date: 11/16/2016 CLINICAL DATA:  Respiratory failure,  shortness of breath, hiatal hernia, hypertension, hyperlipidemia, fibromyalgia, GERD, COPD, former smoker EXAM: PORTABLE CHEST 1 VIEW COMPARISON:  Portable exam 0553 hours compared to 11/12/2016 FINDINGS: Rotated to the LEFT. LEFT arm PICC line tip projects over SVC near cavoatrial junction. Upper normal size of cardiac silhouette with slight vascular congestion. Mediastinal contours normal. Bibasilar effusions atelectasis, minimally improved on LEFT. Calcified granuloma RIGHT upper lobe again seen. No pneumothorax. Bones demineralized. IMPRESSION: Bibasilar pleural effusions and atelectasis with slightly improved aeration at LEFT base. Electronically Signed   By: Lavonia Dana M.D.   On: 11/16/2016 07:35    IMAGING/STUDIES: PORT CXR 9/10:   Worsening bilateral lung opacification compared with earlier chest x-ray imaging. TTE 9/12 >>>EF 19-50%, grade 1 diastolic dysfunction, trivial MR, no vegetation CXR 9/14>> Reviewed by me.Bibasilar effusions atelectasis, minimally improved on LEFT.   MICROBIOLOGY: Blood Cultures x2 8/12:  Negative Blood Cultures x2 8/27:  1/2 Positive ESBL E coli  Urine Culture 8/27:  ESBL E coli  MRSA PCR 8/27:  Negative  Blood Cultures x2 8/29:  Negative  Urine Culture 9/5: Negative  Blood Cultures x2 9/7 >>>NEG MRSA PCR 9/8:  Negative  Body Fluid Culture 9/8:  E coli   ANTIBIOTICS: Merrem 8/27 >>>  SIGNIFICANT EVENTS: 08/27 - Admit 09/08 - Right perc nephrostomy tube placement 09/10 - Transferred to ICU with worsening respiratory status 09/12 - Transitioned back to BiPAP overnight from [place] 5 L/m  ASSESSMENT / PLAN:  76 y.o. female with bacteremia and acute hypoxic respiratory failure. Respiratory status remains tenuous but improving slowly.  Able to be off bipap for longer periods.  Mentation seems to be steadily improving. Escalating Procalcitonin and elevated lactate is concerning but at the same time she has an improving white blood cell count as well as no  fever and otherwise seems to be improving clinically with better respiratory status and mentation. Continuing to require low-dose Neo-Synephrine infusion.  1. Acute hypoxic respiratory failure: Continued effusions per CXRContinuing BiPAP as needed for work of breathing. Weaning FiO2. Continuous pulse oximetry monitoring.  However, no lasix at this time given slight bump in Scr , hypotension and increased pressor needs.  Monitor Scr carefully as continued  bump overnight.  2. Sepsis: Secondary to Escherichia coli bacteremia. Repeat blood cultures NEG. NO evidence vegetation on echo.  3. Escherichia coli bacteremia: Continuing meropenem day #17. Plan reculture for fever.  NO evidence vegetation on echo.Trend WBC, Fever  4. Shock: Likely multifactorial with intravascular volume depletion. Increased pressor needs  Continuing to wean Neo-Synephrine as able. 5. Acute encephalopathy: Confused 9/14. Likely secondary to toxic metabolic and hypoxia. Continuing to avoid sedating medications. 6. Obstructive uropathy: Secondary to urothelial carcinoma. Status post percutaneous nephrostomy tube. 7. Lactic acidosis: Likely secondary to sepsis and poor clearance from acute renal failure. Lactic acid now and 9/15 in am 8. Acute renal failure: Continued increase in creatinine. Patient has known chronic renal  failure. Monitoring urine output with Foley catheter with diuresis. Trend BMET daily 9. Anemia/thrombocytopenia: Hemoglobin improved. No visible signs of active bleeding. Trending cell counts daily. 10. Left hip fracture: Status post surgery by orthopedics.  11. Hypocalcemia: f/u chem, Corrects to 9.5 on 9/14 12. Moderate to severe protein-calorie malnutrition: Continuing dextrose infusion. Dysphagia diet per speech when able to tolerate from respiratory standpoint    Prophylaxis:  SCDs.  Diet:  Cleared by speech for Dysphagia 1 diet when able and liberated from BiPAP. Code Status:  Full Code per previous  physician discussions. Disposition:  Remains critically ill in the ICU. Family Update:  No family at bedside during rounds 9/14.   Magdalen Spatz, AGACNP- [facility] NP 11/16/2016  8:54 AM Pager: [phone]

## 2016-11-16 NOTE — Progress Notes (Signed)
INFECTIOUS DISEASE PROGRESS NOTE  ID: VIRGENE TIRONE is a 76 y.o. female with  Principal Problem:   Acute respiratory distress Active Problems:   Hypertension   Physical deconditioning   Hypothyroidism   Chronic obstructive pulmonary disease (HCC)   Type 2 diabetes mellitus with complication, without long-term current use of insulin (HCC)   Urothelial carcinoma of kidney, right (HCC)   Gastroesophageal reflux disease   History of recent fall   Right renal mass   Fracture of knee prosthesis (HCC)   AKI (acute kidney injury) ([place])   ESBL (extended spectrum beta-lactamase) producing bacteria infection   Closed comminuted supracondylar fracture of left femur ([place])   Renal failure   Fall   Sepsis due to Escherichia coli (E. 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[place])   Hypervolemia   HCAP (healthcare-associated pneumonia)   Leukocytosis   Encephalopathy acute   Hydronephrosis of right kidney   Abnormal CT scan, pelvis: 11/09/2016   Closed fracture of left distal femur (HCC)   Pleural effusion on right   Hypoalbuminemia   Acute renal failure (ARF) (HCC)   Acute respiratory failure with hypoxia (HCC)  Subjective: Requiring increased dose Neo, CPAP Momentarily awake  Abtx:  Anti-infectives    Start     Dose/Rate Route Frequency Ordered Stop   11/07/16 0900  ceFAZolin (ANCEF) IVPB 2g/100 mL premix  Status:  Discontinued     2 g 200 mL/hr over 30 Minutes Intravenous To Short Stay 11/07/16 0809 11/07/16 0934   11/02/16 0700  ceFAZolin (ANCEF) IVPB 2g/100 mL premix     2 g 200 mL/hr over 30 Minutes Intravenous To Short Stay 10/30/16 1212 11/03/16 0700   11/02/16 0700  gentamicin (GARAMYCIN) 280 mg in dextrose 5 % 100 mL IVPB  Status:  Discontinued     5 mg/kg  56.2 kg (Adjusted) 107 mL/hr over 60 Minutes Intravenous 30 min pre-op 11/01/16 0932 11/02/16 1006   10/30/16 1000  fluconazole (DIFLUCAN) tablet 100  mg  Status:  Discontinued     100 mg Oral Daily 10/09/2016 1841 11/12/16 0926   10/30/16 0600  ceFAZolin (ANCEF) IVPB 2g/100 mL premix  Status:  Discontinued     2 g 200 mL/hr over 30 Minutes Intravenous On call to O.R. 10/14/2016 1925 10/30/16 1212   10/30/16 0500  meropenem (MERREM) 1 g in sodium chloride 0.9 % 100 mL IVPB     1 g 200 mL/hr over 30 Minutes Intravenous Every 12 hours 10/06/2016 1632 11/22/16 0459   10/17/2016 1630  meropenem (MERREM) 1 g in sodium chloride 0.9 % 100 mL IVPB     1 g 200 mL/hr over 30 Minutes Intravenous STAT 10/19/2016 1616 10/16/2016 1919   10/27/2016 1545  vancomycin (VANCOCIN) IVPB 1000 mg/200 mL premix     1,000 mg 200 mL/hr over 60 Minutes Intravenous  Once 10/05/2016 1543 10/25/2016 1727   11/02/2016 1545  piperacillin-tazobactam (ZOSYN) IVPB 3.375 g  Status:  Discontinued     3.375 g 100 mL/hr over 30 Minutes Intravenous  Once 10/13/2016 1543 10/05/2016 1546      Medications:  Scheduled: . atorvastatin  40 mg Oral QHS  . budesonide (PULMICORT) nebulizer solution  0.5 mg Nebulization BID  . chlorhexidine gluconate (MEDLINE KIT)  15 mL Mouth Rinse BID  . Chlorhexidine Gluconate Cloth  6 each Topical Daily  . cycloSPORINE  1 drop Both Eyes BID  . feeding supplement (ENSURE ENLIVE)  237 mL Oral BID BM  . ipratropium-albuterol  3 mL Nebulization Q6H  . mouth rinse  15 mL Mouth Rinse QID  . midodrine  2.5 mg Oral TID WC  . sodium chloride flush  10-40 mL Intracatheter Q12H    Objective: Vital signs in last 24 hours: Temp:  [98.6 F (37 C)-98.8 F (37.1 C)] 98.6 F (37 C) (09/14 0000) Pulse Rate:  [117-134] 118 (09/14 0757) Resp:  [14-27] 20 (09/14 0757) BP: (62-124)/(31-86) 97/62 (09/14 0757) SpO2:  [73 %-100 %] 97 % (09/14 0757) FiO2 (%):  [40 %-50 %] 40 % (09/14 0757) Weight:  [94.8 kg (209 lb)] 94.8 kg (209 lb) (09/14 0500)   General appearance: fatigued and no distress Resp: rhonchi anterior - bilateral Cardio: tachycardia GI: normal findings: bowel  sounds normal and soft, non-tender Extremities: edema 3+ pedal  Lab Results  Recent Labs  11/15/16 0335 11/16/16 0437 11/16/16 0540  WBC 18.1* 18.4*  --   HGB 8.2* 7.2*  --   HCT 27.6* 23.2*  --   NA 139  --  140  K 3.9  --  4.4  CL 107  --  108  CO2 22  --  22  BUN 34*  --  36*  CREATININE 1.24*  --  1.49*   Liver Panel  Recent Labs  11/15/16 0335 11/16/16 0540  ALBUMIN 2.3* 2.3*   Sedimentation Rate No results for input(s): ESRSEDRATE in the last 72 hours. C-Reactive Protein No results for input(s): CRP in the last 72 hours.  Microbiology: Recent Results (from the past 240 hour(s))  Urine culture     Status: None   Collection Time: 11/07/16  8:10 AM  Result Value Ref Range Status   Specimen Description URINE, CLEAN CATCH  Final   Special Requests NONE  Final   Culture NO GROWTH  Final   Report Status 11/08/2016 FINAL  Final  Culture, blood (Routine X 2) w Reflex to ID Panel     Status: None   Collection Time: 11/09/16  7:02 PM  Result Value Ref Range Status   Specimen Description BLOOD RIGHT HAND  Final   Special Requests IN PEDIATRIC BOTTLE Blood Culture adequate volume  Final   Culture NO GROWTH 5 DAYS  Final   Report Status 11/14/2016 FINAL  Final  Culture, blood (Routine X 2) w Reflex to ID Panel     Status: None   Collection Time: 11/09/16  7:20 PM  Result Value Ref Range Status   Specimen Description BLOOD LEFT HAND  Final   Special Requests   Final    BOTTLES DRAWN AEROBIC ONLY Blood Culture adequate volume   Culture NO GROWTH 5 DAYS  Final   Report Status 11/14/2016 FINAL  Final  MRSA PCR Screening     Status: None   Collection Time: 11/10/16  2:01 AM  Result Value Ref Range Status   MRSA by PCR NEGATIVE NEGATIVE Final    Comment:        The GeneXpert MRSA Assay (FDA approved for NASAL specimens only), is one component of a comprehensive MRSA colonization surveillance program. It is not intended to diagnose MRSA infection nor to guide  or monitor treatment for MRSA infections.   Urine Culture     Status: None   Collection Time: 11/10/16  9:41 AM  Result Value Ref Range Status   Specimen Description URINE, CATHETERIZED  Final   Special Requests NONE  Final   Culture NO GROWTH  Final  Report Status 11/11/2016 FINAL  Final  Body fluid culture     Status: None   Collection Time: 11/10/16  1:39 PM  Result Value Ref Range Status   Specimen Description FLUID KIDNEY RIGHT  Final   Special Requests NONE  Final   Gram Stain   Final    ABUNDANT WBC PRESENT, PREDOMINANTLY MONONUCLEAR MODERATE GRAM NEGATIVE RODS    Culture   Final    ABUNDANT ESCHERICHIA COLI Confirmed Extended Spectrum Beta-Lactamase Producer (ESBL)    Report Status 11/13/2016 FINAL  Final   Organism ID, Bacteria ESCHERICHIA COLI  Final      Susceptibility   Escherichia coli - MIC*    AMPICILLIN >=32 RESISTANT Resistant     CEFAZOLIN >=64 RESISTANT Resistant     CEFEPIME >=64 RESISTANT Resistant     CEFTAZIDIME 16 INTERMEDIATE Intermediate     CEFTRIAXONE >=64 RESISTANT Resistant     CIPROFLOXACIN >=4 RESISTANT Resistant     GENTAMICIN <=1 SENSITIVE Sensitive     IMIPENEM <=0.25 SENSITIVE Sensitive     TRIMETH/SULFA >=320 RESISTANT Resistant     AMPICILLIN/SULBACTAM >=32 RESISTANT Resistant     PIP/TAZO 16 SENSITIVE Sensitive     Extended ESBL POSITIVE Resistant     * ABUNDANT ESCHERICHIA COLI    Studies/Results: US Renal  Result Date: 11/15/2016 CLINICAL DATA:  Right renal mass.  Acute renal failure. EXAM: RENAL / URINARY TRACT ULTRASOUND COMPLETE COMPARISON:  CT scan 11/09/2016 FINDINGS: Right Kidney: Length: 11.7 cm. Large complex 10 x 7.8 x 7.7 cm mass is again demonstrated. Moderate hydronephrosis. Double-J catheter in place. Left Kidney: Length: 10.7 cm. Normal renal cortical thickness and echogenicity without focal lesions or hydronephrosis. Bladder: Decompressed by a catheter. IMPRESSION: Large complex mass associated with the right  kidney with a double-J catheter in place. Moderate hydronephrosis. Normal left kidney. Electronically Signed   By: Marijo Sanes M.D.   On: 11/15/2016 09:56   Dg Chest Portable 1 View  Result Date: 11/16/2016 CLINICAL DATA:  Respiratory failure, shortness of breath, hiatal hernia, hypertension, hyperlipidemia, fibromyalgia, GERD, COPD, former smoker EXAM: PORTABLE CHEST 1 VIEW COMPARISON:  Portable exam 0553 hours compared to 11/12/2016 FINDINGS: Rotated to the LEFT. LEFT arm PICC line tip projects over SVC near cavoatrial junction. Upper normal size of cardiac silhouette with slight vascular congestion. Mediastinal contours normal. Bibasilar effusions atelectasis, minimally improved on LEFT. Calcified granuloma RIGHT upper lobe again seen. No pneumothorax. Bones demineralized. IMPRESSION: Bibasilar pleural effusions and atelectasis with slightly improved aeration at LEFT base. Electronically Signed   By: Lavonia Dana M.D.   On: 11/16/2016 07:35     Assessment/Plan: Transitoinal Cell CA of R kidney Obstructive uropathy ESBL E coli UTI Femur fracture with L IM nail on 9-4  Total days of antibiotics: 16/3mrrem  she continues to require neo, increasing Her Cr is stable WBC is stable She's been afebrile.  I/O +17L No change in merrem. Would aim for 21 days of merrem.          JBobby RumpfInfectious Diseases (pager) [phone] 11/16/2016, 8:38 AM  LOS: 18 days

## 2016-11-16 NOTE — Progress Notes (Signed)
PT Cancellation Note  Patient Details Name: Isabel Collins MRN: [id number] DOB: 02/06/1941   Cancelled Treatment:    Reason Eval/Treat Not Completed: Medical issues which prohibited therapy (BP soft.  Pt on Bipap.  Not medically stable per nurse.)   Denice Paradise 11/16/2016, 1:28 PM [facility] [phone] (pager)

## 2016-11-16 NOTE — Progress Notes (Signed)
CSW continuing to follow for SNF placement when medically stable.  Jorge Ny, LCSW Clinical Social Worker [phone]

## 2016-11-17 ENCOUNTER — Inpatient Hospital Stay (HOSPITAL_COMMUNITY): Payer: Medicare Other

## 2016-11-17 DIAGNOSIS — R0603 Acute respiratory distress: Secondary | ICD-10-CM

## 2016-11-17 LAB — COMPREHENSIVE METABOLIC PANEL
ALT: 9 U/L — ABNORMAL LOW (ref 14–54)
ANION GAP: 10 (ref 5–15)
AST: 94 U/L — ABNORMAL HIGH (ref 15–41)
Albumin: 2.1 g/dL — ABNORMAL LOW (ref 3.5–5.0)
Alkaline Phosphatase: 119 U/L (ref 38–126)
BUN: 38 mg/dL — ABNORMAL HIGH (ref 6–20)
CHLORIDE: 111 mmol/L (ref 101–111)
CO2: 20 mmol/L — AB (ref 22–32)
Calcium: 7.7 mg/dL — ABNORMAL LOW (ref 8.9–10.3)
Creatinine, Ser: 1.78 mg/dL — ABNORMAL HIGH (ref 0.44–1.00)
GFR calc non Af Amer: 26 mL/min — ABNORMAL LOW (ref >60)
GFR, EST AFRICAN AMERICAN: 31 mL/min — AB (ref >60)
GLUCOSE: 88 mg/dL (ref 65–99)
Potassium: 4 mmol/L (ref 3.5–5.1)
SODIUM: 141 mmol/L (ref 135–145)
TOTAL PROTEIN: 4.9 g/dL — AB (ref 6.5–8.1)
Total Bilirubin: 1.4 mg/dL — ABNORMAL HIGH (ref 0.3–1.2)

## 2016-11-17 LAB — CORTISOL: CORTISOL PLASMA: 19.8 ug/dL

## 2016-11-17 LAB — CBC WITH DIFFERENTIAL/PLATELET
BASOS ABS: 0 10*3/uL (ref 0.0–0.1)
BASOS PCT: 0 %
EOS PCT: 1 %
Eosinophils Absolute: 0.2 10*3/uL (ref 0.0–0.7)
HCT: 28.2 % — ABNORMAL LOW (ref 36.0–46.0)
Hemoglobin: 8.4 g/dL — ABNORMAL LOW (ref 12.0–15.0)
Lymphocytes Relative: 10 %
Lymphs Abs: 2.3 10*3/uL (ref 0.7–4.0)
MCH: 25.8 pg — ABNORMAL LOW (ref 26.0–34.0)
MCHC: 29.8 g/dL — ABNORMAL LOW (ref 30.0–36.0)
MCV: 86.8 fL (ref 78.0–100.0)
MONO ABS: 2.1 10*3/uL — AB (ref 0.1–1.0)
Monocytes Relative: 10 %
NEUTROS ABS: 17.8 10*3/uL — AB (ref 1.7–7.7)
Neutrophils Relative %: 79 %
Platelets: 216 10*3/uL (ref 150–400)
RBC: 3.25 MIL/uL — ABNORMAL LOW (ref 3.87–5.11)
RDW: 20.6 % — AB (ref 11.5–15.5)
WBC: 22.5 10*3/uL — ABNORMAL HIGH (ref 4.0–10.5)

## 2016-11-17 LAB — GLUCOSE, CAPILLARY
GLUCOSE-CAPILLARY: 123 mg/dL — AB (ref 65–99)
Glucose-Capillary: 110 mg/dL — ABNORMAL HIGH (ref 65–99)
Glucose-Capillary: 130 mg/dL — ABNORMAL HIGH (ref 65–99)
Glucose-Capillary: 74 mg/dL (ref 65–99)
Glucose-Capillary: 77 mg/dL (ref 65–99)
Glucose-Capillary: 80 mg/dL (ref 65–99)
Glucose-Capillary: 97 mg/dL (ref 65–99)

## 2016-11-17 LAB — PHOSPHORUS: PHOSPHORUS: 4.2 mg/dL (ref 2.5–4.6)

## 2016-11-17 LAB — LACTIC ACID, PLASMA: LACTIC ACID, VENOUS: 2.6 mmol/L — AB (ref 0.5–1.9)

## 2016-11-17 LAB — MAGNESIUM: Magnesium: 2.3 mg/dL (ref 1.7–2.4)

## 2016-11-17 MED ORDER — HYDROCORTISONE NA SUCCINATE PF 100 MG IJ SOLR
50.0000 mg | Freq: Four times a day (QID) | INTRAMUSCULAR | Status: DC
  Administered 2016-11-17 – 2016-11-21 (×16): 50 mg via INTRAVENOUS
  Filled 2016-11-17 (×16): qty 2

## 2016-11-17 MED ORDER — INFLUENZA VAC SPLIT HIGH-DOSE 0.5 ML IM SUSY
0.5000 mL | PREFILLED_SYRINGE | INTRAMUSCULAR | Status: DC
  Filled 2016-11-17: qty 0.5

## 2016-11-17 MED ORDER — MIDODRINE HCL 5 MG PO TABS
5.0000 mg | ORAL_TABLET | Freq: Three times a day (TID) | ORAL | Status: DC
  Administered 2016-11-17: 5 mg via ORAL
  Filled 2016-11-17: qty 1

## 2016-11-17 MED ORDER — LIP MEDEX EX OINT
TOPICAL_OINTMENT | CUTANEOUS | Status: DC | PRN
  Administered 2016-11-17: 1 via TOPICAL
  Filled 2016-11-17: qty 7

## 2016-11-17 NOTE — Progress Notes (Signed)
Pharmacy Antibiotic Note  Isabel Collins is a 76 y.o. female admitted on 10/26/2016 with ESBL UTI.  Pharmacy has been consulted for meropenem dosing. SCr improved 1.7>1.1, but back to 1.78 today. s/p R nephrostomy tube placement -  ESBL bacteremia, fuid-right kidney, WBC trending back up.  Plan: Continue meropenem 1g IV q12h. Day #19 - Meropenem started in evening of 8/27.  Height: 5\' 2"  (157.5 cm) Weight: 213 lb (96.6 kg) IBW/kg (Calculated) : 50.1  Temp (24hrs), Avg:98.3 F (36.8 C), Min:97.7 F (36.5 C), Max:99 F (37.2 C)   Recent Labs Lab 11/12/16 1758 11/13/16 0331 11/13/16 0642 11/14/16 0531 11/14/16 0710 11/15/16 0335 11/15/16 1318 11/16/16 0437 11/16/16 0540 11/16/16 0940 11/17/16 0431 11/17/16 0435  WBC  --   --  18.9* 15.4*  --  18.1*  --  18.4*  --   --   --  22.5*  CREATININE  --  1.51*  --   --  1.08* 1.24*  --   --  1.49*  --   --  1.78*  LATICACIDVEN 2.3*  --   --   --   --  3.1* 3.1*  --   --  2.9* 2.6*  --     Estimated Creatinine Clearance: 28.7 mL/min (A) (by C-G formula based on SCr of 1.78 mg/dL (H)).    No Known Allergies  Antimicrobials this admission: 8/27 vanc x 1  8/27 meropenem >> (9/19) - ?this stop date actually longer than 21d? PTA fluconazole>>9/10  *Daily fluconazole until surgery per urology as grew yeast as outpt - stopped per ID  Dose adjustments this admission: none  8/27 BCx: ESBL E.coli 8/27 UCx: ESBL E.coli  8/27 MRSA PCR: neg 8/29 BCx: NGTD x5 9/5 urine: neg 9/7 BCx x 2: neg 9/8 UCx: neg 9/8 MRSA: neg 9/8 kidney fluid Cx: ESBL E.coli  Uvaldo Rising, BCPS  Clinical Pharmacist Pager [phone]  11/17/2016 12:57 PM

## 2016-11-17 NOTE — Progress Notes (Signed)
  Speech Language Pathology Treatment: Dysphagia  Patient Details Name: Isabel Collins MRN: [id number] DOB: 12/28/1940 Today's Date: 11/17/2016 Time: 2800-3491 SLP Time Calculation (min) (ACUTE ONLY): 15 min  Assessment / Plan / Recommendation Clinical Impression  Pt seen for dysphagia follow-up for diet tolerance. Pt has been on bipap, however off since 7 this morning, has not been eating well. She is alert but confused, not making eye contact. Initially refusing solids; when she did accept teaspoon puree she held bolus anteriorly without transiting despite cues. Sip of thin liquid aided clearance, no signs of aspiration noted. Attempted self-feeding of regular solids; pt with poor awareness of bolus, with 1/2 graham cracker protruding from oral cavity which she ultimately retrieved and held orally without visible attempts to masticate, requiring SLP to retrieve/remove approx 2/3 of bolus. With max cues pt masticated but then pocketed bolus in L buccal cavity; she would not allow SLP to sweep bolus to tongue manually or remove, but ultimately cleared with liquid wash. She retrieves thin liquids via straw with appearance of timely swallow and adequate airway protection. Due to her current cognitive status, poor awareness of and aversion to solids with poor oral transit, I recommend downgrade to full liquids at this time to facilitate ease of intake. Continue meds crushed in puree, full supervision. Will follow up for tolerance, advancement of solids should her mental status improve.   HPI HPI: Pt is a 76 yo female admitted s/p fall with a traumatic closed displaced supracondylar fracture of left femur, s/p IM nail 9/5.  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, HH, GERD      SLP Plan  Continue with current plan of care       Recommendations  Diet recommendations: Thin liquid (full  liquids) Liquids provided via: Cup;Straw Medication Administration: Crushed with puree Supervision: Patient able to self feed;Full supervision/cueing for compensatory strategies Compensations: Small sips/bites;Slow rate Postural Changes and/or Swallow Maneuvers: Seated upright 90 degrees;Upright 30-60 min after meal                Oral Care Recommendations: Oral care BID Follow up Recommendations: Skilled Nursing facility SLP Visit Diagnosis: Dysphagia, oral phase (R13.11) Plan: Continue with current plan of care       [place], [place], [place] Pathologist [phone]  Aliene Altes 11/17/2016, 9:27 AM

## 2016-11-17 NOTE — Progress Notes (Signed)
PULMONARY / CRITICAL CARE MEDICINE   Name: Isabel Collins MRN: [id number] DOB: Aug 03, 1940    ADMISSION DATE:  10/28/2016   CONSULTATION DATE:  11/10/2016  REFERRING MD:  D. Grandville Silos, M.D. / [facility]  CHIEF COMPLAINT: Sepsis  HISTORY OF PRESENT ILLNESS:  76 y.o. 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 Patient underwent right ureteral stent placement on 6/25 and was subsequently scheduled for nephrectomy on 8/30 but had a fall with left femur fracture that was nailed on 9/4. Patient has developed an ESBL Escherichia coli infection with bacteremia. She did require nephrostomy tube placement on the right on 9/8. PCCM was asked to consult on 9/8 with tenuous medical status & multiple medical problems.  SUBJECTIVE:   RN concerned about nutritional status for patient and inability to remain off bipap.  Decreased UOP.    VITAL SIGNS: BP (!) 100/56   Pulse (!) 105   Temp 97.8 F (36.6 C) (Oral)   Resp (!) 23   Ht 5\' 2"  (1.575 m)   Wt 213 lb (96.6 kg)   SpO2 96%   BMI 38.96 kg/m   HEMODYNAMICS: CVP:  [11 mmHg-12 mmHg] 11 mmHg  VENTILATOR SETTINGS: FiO2 (%):  [40 %] 40 %  INTAKE / OUTPUT: I/O last 3 completed shifts: In: 2560.7 [P.O.:200; I.V.:2010.7; IV Piggyback:350] Out: 270 [Urine:270]  PHYSICAL EXAMINATION: General:  Chronically ill appearing female in NAD HEENT: MM pink/moist, no jvd, poor dentition  Neuro: Awake, alert, oriented to self/situation, MAE, generalized weakness CV: s1s2 rrr, no m/r/g PULM: even/non-labored, wheezing on R, clear on L  UM:PNTI, non-tender, bsx4 active  Extremities: warm/dry, no edema. LLE toes dusky Skin: no rashes or lesions  LABS:  BMET  Recent Labs Lab 11/15/16 0335 11/16/16 0540 11/17/16 0435  NA 139 140 141  K 3.9 4.4 4.0  CL 107 108 111  CO2 22 22 20*  BUN 34* 36* 38*  CREATININE 1.24* 1.49* 1.78*  GLUCOSE 86 91 88    Electrolytes  Recent Labs Lab 11/15/16 0335 11/16/16 0540  11/17/16 0435  CALCIUM 7.8* 8.1* 7.7*  MG 2.3 2.4 2.3  PHOS 4.0 4.3 4.2    CBC  Recent Labs Lab 11/15/16 0335 11/16/16 0437 11/17/16 0435  WBC 18.1* 18.4* 22.5*  HGB 8.2* 7.2* 8.4*  HCT 27.6* 23.2* 28.2*  PLT 191 169 216    Coag's No results for input(s): APTT, INR in the last 168 hours.  Sepsis Markers  Recent Labs Lab 11/12/16 1108  11/13/16 0331 11/14/16 0710  11/15/16 1318 11/16/16 0940 11/17/16 0431  LATICACIDVEN 2.4*  < >  --   --   < > 3.1* 2.9* 2.6*  PROCALCITON 0.32  --  0.65 2.69  --   --   --   --   < > = values in this interval not displayed.  ABG  Recent Labs Lab 11/12/16 0945  PHART 7.431  PCO2ART 37.5  PO2ART 62.8*    Liver Enzymes  Recent Labs Lab 11/15/16 0335 11/16/16 0540 11/17/16 0435  AST  --   --  94*  ALT  --   --  9*  ALKPHOS  --   --  119  BILITOT  --   --  1.4*  ALBUMIN 2.3* 2.3* 2.1*    Cardiac Enzymes No results for input(s): TROPONINI, PROBNP in the last 168 hours.  Glucose  Recent Labs Lab 11/16/16 0842 11/16/16 1146 11/16/16 1613 11/16/16 2047 11/16/16 2359 11/17/16 0402  GLUCAP 102* 90  92 86 80 77    Imaging Dg Chest Port 1 View  Result Date: 11/17/2016 CLINICAL DATA:  Followup respiratory failure. EXAM: PORTABLE CHEST 1 VIEW COMPARISON:  11/16/2016 FINDINGS: Left arm PICC is unchanged with its tip in the SVC 3 cm above the right atrium. Persistent bilateral effusions with pulmonary volume loss, worse on the right than the left. No significant change since yesterday, but worsened as a long-term trend. IMPRESSION: Persistent slowly worsening effusions and volume loss. Electronically Signed   By: Nelson Chimes M.D.   On: 11/17/2016 07:46    IMAGING/STUDIES: PORT CXR 9/10:   Worsening bilateral lung opacification compared with earlier chest x-ray imaging. TTE 9/12:  EF 26-83%, grade 1 diastolic dysfunction, trivial MR, no vegetation   MICROBIOLOGY: Blood Cultures x2 8/12:  Negative Blood Cultures x2  8/27:  1/2 Positive ESBL E coli  Urine Culture 8/27:  ESBL E coli  MRSA PCR 8/27:  Negative  Blood Cultures x2 8/29:  Negative  Urine Culture 9/5: Negative  Blood Cultures x2 9/7 >>>NEG MRSA PCR 9/8:  Negative  Body Fluid Culture 9/8:  E coli   ANTIBIOTICS: Merrem 8/27 >>>  SIGNIFICANT EVENTS: 08/27 - Admit 09/08 - Right perc nephrostomy tube placement 09/10 - Transferred to ICU with worsening respiratory status 09/12 - Transitioned back to BiPAP overnight from [facility] 5 L/m  ASSESSMENT / PLAN:  76 y.o. female with bacteremia and acute hypoxic respiratory failure. Respiratory status remains tenuous but improving slowly.  Able to be off bipap for longer periods.  Mentation seems to be steadily improving. Escalating Procalcitonin and elevated lactate is concerning but at the same time she has an improving white blood cell count as well as no fever and otherwise seems to be improving clinically with better respiratory status and mentation. Continuing to require Neo-Synephrine infusion.  Acute hypoxic respiratory failure Pleural Effusions - bilateral, volume overload P:  Intermittent BiPAP  Follow CXR  Wean FiO2 for sats > 90% Pulmonary hygiene - IS, mobilize  Duoneb Q6  Sepsis - secondary to E-coli bacteremia. Repeat blood cultures NEG. NO evidence vegetation on echo.  P: Monitor fever curve / WBC trend    Escherichia coli bacteremia P: Meropenem D16/21 abx   Shock - likely multifactorial with intravascular volume depletion P: Assess cortisol Pending above, may need stress steroids  Increase midodrine to 5mg  Q8  Acute encephalopathy - suspect toxic metabolic encephalopathy, improving  P: Monitor / supportive  Avoid sedating medications    Obstructive uropathy - in setting of urothelial carcinoma. Status post percutaneous nephrostomy tube. P: Per Urology   Lactic acidosis - suspect secondary to sepsis and poor clearance from acute renal failure P: Trend  lactate   Acute renal failure P: Trend BMP / urinary output Replace electrolytes as indicated Avoid nephrotoxic agents, ensure adequate renal perfusion   Anemia/thrombocytopenia P: Trend CBC  Monitor for bleeding   Left hip fracture - status post nail by orthopedics.  P: Per Ortho Mobilize as able.  Last order states no weight bearing on L leg  Hypocalcemia P: Monitor   Moderate to severe protein-calorie malnutrition P: Dysphagia 1 diet  SLP following  May need to consider small bore feeding tube if PO intake does not pick up D10 At [facility]   Prophylaxis:  SCDs.  Diet:  Cleared by speech for Dysphagia 1 diet. Code Status:  Full Code. Disposition:  Remains critically ill in the ICU. Family Update:  No family available on NP rounding 9/15.  She reportedly  has no family and is homeless.   Noe Gens, NP-C [facility] Pgr: [phone] or if no answer [phone] 11/17/2016, 8:24 AM

## 2016-11-18 ENCOUNTER — Inpatient Hospital Stay (HOSPITAL_COMMUNITY): Payer: Medicare Other

## 2016-11-18 LAB — BASIC METABOLIC PANEL
ANION GAP: 11 (ref 5–15)
BUN: 44 mg/dL — AB (ref 6–20)
CALCIUM: 8 mg/dL — AB (ref 8.9–10.3)
CO2: 20 mmol/L — AB (ref 22–32)
Chloride: 108 mmol/L (ref 101–111)
Creatinine, Ser: 2.21 mg/dL — ABNORMAL HIGH (ref 0.44–1.00)
GFR calc Af Amer: 24 mL/min — ABNORMAL LOW (ref >60)
GFR calc non Af Amer: 20 mL/min — ABNORMAL LOW (ref >60)
GLUCOSE: 142 mg/dL — AB (ref 65–99)
Potassium: 4.4 mmol/L (ref 3.5–5.1)
Sodium: 139 mmol/L (ref 135–145)

## 2016-11-18 LAB — CBC
HCT: 27.5 % — ABNORMAL LOW (ref 36.0–46.0)
Hemoglobin: 8.2 g/dL — ABNORMAL LOW (ref 12.0–15.0)
MCH: 26 pg (ref 26.0–34.0)
MCHC: 29.8 g/dL — ABNORMAL LOW (ref 30.0–36.0)
MCV: 87.3 fL (ref 78.0–100.0)
PLATELETS: 194 10*3/uL (ref 150–400)
RBC: 3.15 MIL/uL — ABNORMAL LOW (ref 3.87–5.11)
RDW: 21.1 % — AB (ref 11.5–15.5)
WBC: 20.1 10*3/uL — AB (ref 4.0–10.5)

## 2016-11-18 LAB — GLUCOSE, CAPILLARY
GLUCOSE-CAPILLARY: 120 mg/dL — AB (ref 65–99)
GLUCOSE-CAPILLARY: 108 mg/dL — AB (ref 65–99)
Glucose-Capillary: 130 mg/dL — ABNORMAL HIGH (ref 65–99)
Glucose-Capillary: 113 mg/dL — ABNORMAL HIGH (ref 65–99)

## 2016-11-18 LAB — RENAL FUNCTION PANEL
ALBUMIN: 2.2 g/dL — AB (ref 3.5–5.0)
ANION GAP: 11 (ref 5–15)
BUN: 43 mg/dL — ABNORMAL HIGH (ref 6–20)
CALCIUM: 8.1 mg/dL — AB (ref 8.9–10.3)
CO2: 19 mmol/L — ABNORMAL LOW (ref 22–32)
CREATININE: 2.19 mg/dL — AB (ref 0.44–1.00)
Chloride: 110 mmol/L (ref 101–111)
GFR, EST AFRICAN AMERICAN: 24 mL/min — AB (ref >60)
GFR, EST NON AFRICAN AMERICAN: 21 mL/min — AB (ref >60)
Glucose, Bld: 146 mg/dL — ABNORMAL HIGH (ref 65–99)
PHOSPHORUS: 4.8 mg/dL — AB (ref 2.5–4.6)
Potassium: 4.4 mmol/L (ref 3.5–5.1)
SODIUM: 140 mmol/L (ref 135–145)

## 2016-11-18 LAB — MAGNESIUM: MAGNESIUM: 2.3 mg/dL (ref 1.7–2.4)

## 2016-11-18 MED ORDER — MIDODRINE HCL 5 MG PO TABS
5.0000 mg | ORAL_TABLET | Freq: Three times a day (TID) | ORAL | Status: DC
  Administered 2016-11-18 – 2016-11-22 (×11): 5 mg via ORAL
  Filled 2016-11-18 (×11): qty 1

## 2016-11-18 MED ORDER — FENTANYL CITRATE (PF) 100 MCG/2ML IJ SOLN
12.5000 ug | INTRAMUSCULAR | Status: DC | PRN
  Administered 2016-11-18 – 2016-11-19 (×6): 12.5 ug via INTRAVENOUS
  Filled 2016-11-18 (×7): qty 2

## 2016-11-18 MED ORDER — ALBUMIN HUMAN 25 % IV SOLN
12.5000 g | Freq: Once | INTRAVENOUS | Status: AC
  Administered 2016-11-18: 12.5 g via INTRAVENOUS
  Filled 2016-11-18: qty 50

## 2016-11-18 MED ORDER — SODIUM CHLORIDE 0.9 % IV BOLUS (SEPSIS)
500.0000 mL | Freq: Once | INTRAVENOUS | Status: AC
  Administered 2016-11-18: 500 mL via INTRAVENOUS

## 2016-11-18 NOTE — Progress Notes (Signed)
PULMONARY / CRITICAL CARE MEDICINE   Name: Isabel Collins MRN: [id number] DOB: February 24, 1941    ADMISSION DATE:  11/02/2016   CONSULTATION DATE:  11/10/2016  REFERRING MD:  D. Grandville Silos, M.D. / [facility]  CHIEF COMPLAINT: Sepsis  HISTORY OF PRESENT ILLNESS:  76 y.o. 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 Patient underwent right ureteral stent placement on 6/25 and was subsequently scheduled for nephrectomy on 8/30 but had a fall with left femur fracture that was nailed on 9/4. Patient has developed an ESBL Escherichia coli infection with bacteremia. She did require nephrostomy tube placement on the right on 9/8. PCCM was asked to consult on 9/8 with tenuous medical status & multiple medical problems.  SUBJECTIVE: RN reports concerns for long term plan - no family to help make decisions.  Patient denies pain.  Wore BiPAP overnight.  Poor UOP.    VITAL SIGNS: BP 113/62   Pulse (!) 101   Temp 97.6 F (36.4 C) (Axillary)   Resp 15   Ht 5\' 2"  (1.575 m)   Wt 214 lb (97.1 kg)   SpO2 94%   BMI 39.14 kg/m   HEMODYNAMICS:    VENTILATOR SETTINGS: FiO2 (%):  [40 %] 40 %  INTAKE / OUTPUT: I/O last 3 completed shifts: In: 3284.7 [P.O.:720; I.V.:2074.7; Other:40; IV Piggyback:450] Out: 110 [Urine:110]  PHYSICAL EXAMINATION: General: elderly female in NAD  HEENT: MM pink/moist, no jvd Neuro: AAOx4, speech clear, MAE  CV: s1s2 rrr, no m/r/g PULM: even/non-labored, lungs bilaterally diminished YQ:MVHQ, non-tender, bsx4 active  Extremities: warm/dry, generalized 1+ edema  Skin: no rashes or lesions   LABS:  BMET  Recent Labs Lab 11/16/16 0540 11/17/16 0435 11/18/16 0355  NA 140 141 140  139  K 4.4 4.0 4.4  4.4  CL 108 111 110  108  CO2 22 20* 19*  20*  BUN 36* 38* 43*  44*  CREATININE 1.49* 1.78* 2.19*  2.21*  GLUCOSE 91 88 146*  142*    Electrolytes  Recent Labs Lab 11/16/16 0540 11/17/16 0435 11/18/16 0355   CALCIUM 8.1* 7.7* 8.1*  8.0*  MG 2.4 2.3 2.3  PHOS 4.3 4.2 4.8*    CBC  Recent Labs Lab 11/16/16 0437 11/17/16 0435 11/18/16 0355  WBC 18.4* 22.5* 20.1*  HGB 7.2* 8.4* 8.2*  HCT 23.2* 28.2* 27.5*  PLT 169 216 194    Coag's No results for input(s): APTT, INR in the last 168 hours.  Sepsis Markers  Recent Labs Lab 11/12/16 1108  11/13/16 0331 11/14/16 0710  11/15/16 1318 11/16/16 0940 11/17/16 0431  LATICACIDVEN 2.4*  < >  --   --   < > 3.1* 2.9* 2.6*  PROCALCITON 0.32  --  0.65 2.69  --   --   --   --   < > = values in this interval not displayed.  ABG  Recent Labs Lab 11/12/16 0945  PHART 7.431  PCO2ART 37.5  PO2ART 62.8*    Liver Enzymes  Recent Labs Lab 11/16/16 0540 11/17/16 0435 11/18/16 0355  AST  --  94*  --   ALT  --  9*  --   ALKPHOS  --  119  --   BILITOT  --  1.4*  --   ALBUMIN 2.3* 2.1* 2.2*    Cardiac Enzymes No results for input(s): TROPONINI, PROBNP in the last 168 hours.  Glucose  Recent Labs Lab 11/17/16 0402 11/17/16 0814 11/17/16 1151 11/17/16 1611 11/17/16 2026 11/17/16  [place] 74 110* 97 123* 130*    Imaging No results found.  IMAGING/STUDIES: PORT CXR 9/10:   Worsening bilateral lung opacification compared with earlier chest x-ray imaging. TTE 9/12:  EF 14-97%, grade 1 diastolic dysfunction, trivial MR, no vegetation   MICROBIOLOGY: Blood Cultures x2 8/12:  Negative Blood Cultures x2 8/27:  1/2 Positive ESBL E coli  Urine Culture 8/27:  ESBL E coli  MRSA PCR 8/27:  Negative  Blood Cultures x2 8/29:  Negative  Urine Culture 9/5: Negative  Blood Cultures x2 9/7 >>>NEG MRSA PCR 9/8:  Negative  Body Fluid Culture 9/8:  E coli   ANTIBIOTICS: Merrem 8/27 >>  SIGNIFICANT EVENTS: 08/27 - Admit 09/08 - Right perc nephrostomy tube placement 09/10 - Transferred to ICU with worsening respiratory status 09/12 - Transitioned back to BiPAP overnight from [place] 5 L/m  ASSESSMENT / PLAN:  76 y.o.  female with bacteremia and acute hypoxic respiratory failure. Respiratory status remains tenuous but improving slowly.  Able to be off bipap for longer periods.  Mentation seems to be steadily improving. Escalating Procalcitonin and elevated lactate is concerning but at the same time she has an improving white blood cell count as well as no fever and otherwise seems to be improving clinically with better respiratory status and mentation. Continuing to require Neo-Synephrine infusion.  Acute hypoxic respiratory failure Pleural Effusions - bilateral, volume overload P:  Intermittent BiPAP  Follow CXR  Wean O2 for sats > 90% Pulmonary hygiene - IS, mobilize Duoneb Q6  Sepsis - secondary to E-coli bacteremia. Repeat blood cultures NEG. NO evidence vegetation on echo.  P: Monitor fever curve / WBC trend   Escherichia coli bacteremia P: Meropenem D17/21 abx  ID following   Shock - likely multifactorial with intravascular volume depletion P: Stress steroids added 9/16 Increase midodrine to 10 mg Q8 Trial fluid challenge > 542ml x1, albumin x1   Acute encephalopathy - suspect toxic metabolic encephalopathy, improving  P: Monitor / supportive care PT efforts as able   Obstructive uropathy - in setting of urothelial carcinoma. Status post percutaneous nephrostomy tube. P: Per Urology   Lactic acidosis - suspect secondary to sepsis and poor clearance from acute renal failure P: Trend lactate  Acute renal failure P: Trend BMP / urinary output Replace electrolytes as indicated Avoid nephrotoxic agents, ensure adequate renal perfusion IVF 529ml bolus x1 > with reassessment of UOP after   Anemia/thrombocytopenia P: Trend CBC Monitor for bleeding   Left hip fracture - status post nail by orthopedics.  P: Per Ortho  Mobilize as able > last orders for no weight bearing on left leg    Hypocalcemia P: Monitor    Moderate to severe protein-calorie malnutrition P: Dysphagia 1  Diet as tolerated  Aspiration precautions  SLP following  May need to consider small bore feeding tube if PO intake not improved 9/17 D10 at [facility]   Prophylaxis:  SCDs.  Diet:  Cleared by speech for Dysphagia 1 diet. Code Status:  Full Code. Disposition:  Remains critically ill in the ICU. Family Update:  No family available on NP rounding 9/16.  She reportedly has no family and is homeless.  Social work consulted to establish legal guardian for patient.    Noe Gens, NP-C [facility] Pgr: [phone] or if no answer [phone] 11/18/2016, 7:46 AM

## 2016-11-19 ENCOUNTER — Inpatient Hospital Stay (HOSPITAL_COMMUNITY): Payer: Medicare Other

## 2016-11-19 DIAGNOSIS — S72452A Displaced supracondylar fracture without intracondylar extension of lower end of left femur, initial encounter for closed fracture: Secondary | ICD-10-CM

## 2016-11-19 DIAGNOSIS — S72452S Displaced supracondylar fracture without intracondylar extension of lower end of left femur, sequela: Secondary | ICD-10-CM

## 2016-11-19 DIAGNOSIS — S72402A Unspecified fracture of lower end of left femur, initial encounter for closed fracture: Secondary | ICD-10-CM

## 2016-11-19 DIAGNOSIS — T84019A Broken internal joint prosthesis, unspecified site, initial encounter: Secondary | ICD-10-CM

## 2016-11-19 LAB — GLUCOSE, CAPILLARY
GLUCOSE-CAPILLARY: 134 mg/dL — AB (ref 65–99)
GLUCOSE-CAPILLARY: 130 mg/dL — AB (ref 65–99)
GLUCOSE-CAPILLARY: 124 mg/dL — AB (ref 65–99)
GLUCOSE-CAPILLARY: 57 mg/dL — AB (ref 65–99)
Glucose-Capillary: 106 mg/dL — ABNORMAL HIGH (ref 65–99)
Glucose-Capillary: 54 mg/dL — ABNORMAL LOW (ref 65–99)
Glucose-Capillary: 87 mg/dL (ref 65–99)
Glucose-Capillary: 94 mg/dL (ref 65–99)
Glucose-Capillary: 98 mg/dL (ref 65–99)

## 2016-11-19 LAB — RENAL FUNCTION PANEL
ANION GAP: 12 (ref 5–15)
Albumin: 2.4 g/dL — ABNORMAL LOW (ref 3.5–5.0)
BUN: 48 mg/dL — AB (ref 6–20)
CHLORIDE: 110 mmol/L (ref 101–111)
CO2: 19 mmol/L — ABNORMAL LOW (ref 22–32)
Calcium: 8.3 mg/dL — ABNORMAL LOW (ref 8.9–10.3)
Creatinine, Ser: 2.58 mg/dL — ABNORMAL HIGH (ref 0.44–1.00)
GFR calc Af Amer: 20 mL/min — ABNORMAL LOW (ref >60)
GFR, EST NON AFRICAN AMERICAN: 17 mL/min — AB (ref >60)
GLUCOSE: 123 mg/dL — AB (ref 65–99)
POTASSIUM: 4.4 mmol/L (ref 3.5–5.1)
Phosphorus: 4.5 mg/dL (ref 2.5–4.6)
Sodium: 141 mmol/L (ref 135–145)

## 2016-11-19 LAB — CBC
HEMATOCRIT: 26.5 % — AB (ref 36.0–46.0)
HEMOGLOBIN: 8 g/dL — AB (ref 12.0–15.0)
MCH: 26.3 pg (ref 26.0–34.0)
MCHC: 30.2 g/dL (ref 30.0–36.0)
MCV: 87.2 fL (ref 78.0–100.0)
Platelets: 177 10*3/uL (ref 150–400)
RBC: 3.04 MIL/uL — ABNORMAL LOW (ref 3.87–5.11)
RDW: 21.8 % — AB (ref 11.5–15.5)
WBC: 18.5 10*3/uL — AB (ref 4.0–10.5)

## 2016-11-19 LAB — AMMONIA: Ammonia: 23 umol/L (ref 9–35)

## 2016-11-19 LAB — PROCALCITONIN: PROCALCITONIN: 12.57 ng/mL

## 2016-11-19 LAB — MAGNESIUM: Magnesium: 2.4 mg/dL (ref 1.7–2.4)

## 2016-11-19 MED ORDER — FENTANYL CITRATE (PF) 100 MCG/2ML IJ SOLN
12.5000 ug | INTRAMUSCULAR | Status: DC | PRN
  Administered 2016-11-19 – 2016-11-21 (×9): 12.5 ug via INTRAVENOUS
  Filled 2016-11-19 (×9): qty 2

## 2016-11-19 MED ORDER — DEXTROSE 50 % IV SOLN
25.0000 mL | Freq: Once | INTRAVENOUS | Status: AC
  Administered 2016-11-19: 25 mL via INTRAVENOUS

## 2016-11-19 MED ORDER — MEROPENEM 500 MG IV SOLR
500.0000 mg | Freq: Two times a day (BID) | INTRAVENOUS | Status: AC
  Administered 2016-11-19 – 2016-11-21 (×5): 500 mg via INTRAVENOUS
  Filled 2016-11-19 (×6): qty 0.5

## 2016-11-19 MED ORDER — DEXTROSE 50 % IV SOLN
INTRAVENOUS | Status: AC
  Administered 2016-11-19: 25 mL via INTRAVENOUS
  Filled 2016-11-19: qty 50

## 2016-11-19 MED ORDER — LIDOCAINE 5 % EX PTCH
1.0000 | MEDICATED_PATCH | Freq: Every day | CUTANEOUS | Status: DC
  Administered 2016-11-19 – 2016-11-24 (×6): 1 via TRANSDERMAL
  Filled 2016-11-19 (×7): qty 1

## 2016-11-19 MED ORDER — DEXTROSE 50 % IV SOLN
INTRAVENOUS | Status: AC
  Filled 2016-11-19: qty 50

## 2016-11-19 NOTE — Progress Notes (Signed)
Pharmacy Antibiotic Note  Isabel Collins is a 76 y.o. female admitted on 10/26/2016 with ESBL UTI and concommitant bacteremia. Repeat blood cultures are negative, WBC remains elevated. Renal function has begun worsening, CrCl currently around 20 ml/min.  Plan: -Reduce meropenem to 500mg  IV q12h -Monitor renal function, cultures  Height: 5\' 2"  (157.5 cm) Weight: 215 lb (97.5 kg) IBW/kg (Calculated) : 50.1  Temp (24hrs), Avg:97.3 F (36.3 C), Min:96.8 F (36 C), Max:97.7 F (36.5 C)   Recent Labs Lab 11/12/16 1758  11/15/16 0335 11/15/16 1318 11/16/16 0437 11/16/16 0540 11/16/16 0940 11/17/16 0431 11/17/16 0435 11/18/16 0355 11/19/16 0515  WBC  --   < > 18.1*  --  18.4*  --   --   --  22.5* 20.1* 18.5*  CREATININE  --   < > 1.24*  --   --  1.49*  --   --  1.78* 2.19*  2.21* 2.58*  LATICACIDVEN 2.3*  --  3.1* 3.1*  --   --  2.9* 2.6*  --   --   --   < > = values in this interval not displayed.  Estimated Creatinine Clearance: 19.9 mL/min (A) (by C-G formula based on SCr of 2.58 mg/dL (H)).    No Known Allergies  Antimicrobials this admission: 8/27 vancomycin x 1  8/27 meropenem >> (9/19) PTA fluconazole>>9/10   Dose adjustments this admission: 9/17: Reduce dose to 500mg  IV q12h given CrCl 20 ml/min  Microbiology: 8/27 BCx: ESBL E.coli 8/27 UCx: ESBL E.coli  8/27 MRSA PCR: neg 8/29 BCx: NGTD x5 9/5 urine: neg 9/7 BCx x 2: neg 9/8 UCx: neg 9/8 MRSA: neg 9/8 kidney fluid Cx: ESBL E.coli  Arrie Senate, PharmD PGY-2 Cardiology Pharmacy Resident Pager: [phone] 11/19/2016

## 2016-11-19 NOTE — Progress Notes (Signed)
INFECTIOUS DISEASE PROGRESS NOTE  ID: Isabel Collins is a 76 y.o. female with  Principal Problem:   Acute respiratory distress Active Problems:   Hypertension   Physical deconditioning   Hypothyroidism   Chronic obstructive pulmonary disease (HCC)   Type 2 diabetes mellitus with complication, without long-term current use of insulin (HCC)   Urothelial carcinoma of kidney, right (HCC)   Gastroesophageal reflux disease   History of recent fall   Right renal mass   Fracture of knee prosthesis (HCC)   AKI (acute kidney injury) ([place])   ESBL (extended spectrum beta-lactamase) producing bacteria infection   Closed comminuted supracondylar fracture of left femur ([place])   Renal failure   Fall   Sepsis due to Escherichia coli (E. 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[place])   Hypervolemia   HCAP (healthcare-associated pneumonia)   Leukocytosis   Encephalopathy acute   Hydronephrosis of right kidney   Abnormal CT scan, pelvis: 11/09/2016   Closed fracture of left distal femur (HCC)   Pleural effusion on right   Hypoalbuminemia   Acute renal failure (ARF) ([place])   Acute respiratory failure with hypoxia (HCC)  Subjective: Does not respond to voice  Abtx:  Anti-infectives    Start     Dose/Rate Route Frequency Ordered Stop   11/19/16 1700  meropenem (MERREM) 500 mg in sodium chloride 0.9 % 50 mL IVPB     500 mg 100 mL/hr over 30 Minutes Intravenous Every 12 hours 11/19/16 0926 11/22/16 0459   11/07/16 0900  ceFAZolin (ANCEF) IVPB 2g/100 mL premix  Status:  Discontinued     2 g 200 mL/hr over 30 Minutes Intravenous To Short Stay 11/07/16 0809 11/07/16 0934   11/02/16 0700  ceFAZolin (ANCEF) IVPB 2g/100 mL premix     2 g 200 mL/hr over 30 Minutes Intravenous To Short Stay 10/30/16 1212 11/03/16 0700   11/02/16 0700  gentamicin (GARAMYCIN) 280 mg in dextrose 5 % 100 mL IVPB  Status:  Discontinued     5 mg/kg  56.2  kg (Adjusted) 107 mL/hr over 60 Minutes Intravenous 30 min pre-op 11/01/16 0932 11/02/16 1006   10/30/16 1000  fluconazole (DIFLUCAN) tablet 100 mg  Status:  Discontinued     100 mg Oral Daily 10/23/2016 1841 11/12/16 0926   10/30/16 0600  ceFAZolin (ANCEF) IVPB 2g/100 mL premix  Status:  Discontinued     2 g 200 mL/hr over 30 Minutes Intravenous On call to O.R. 10/20/2016 1925 10/30/16 1212   10/30/16 0500  meropenem (MERREM) 1 g in sodium chloride 0.9 % 100 mL IVPB  Status:  Discontinued     1 g 200 mL/hr over 30 Minutes Intravenous Every 12 hours 10/13/2016 1632 11/19/16 0926   10/24/2016 1630  meropenem (MERREM) 1 g in sodium chloride 0.9 % 100 mL IVPB     1 g 200 mL/hr over 30 Minutes Intravenous STAT 10/18/2016 1616 10/04/2016 1919   10/24/2016 1545  vancomycin (VANCOCIN) IVPB 1000 mg/200 mL premix     1,000 mg 200 mL/hr over 60 Minutes Intravenous  Once 10/06/2016 1543 10/13/2016 1727   10/03/2016 1545  piperacillin-tazobactam (ZOSYN) IVPB 3.375 g  Status:  Discontinued     3.375 g 100 mL/hr over 30 Minutes Intravenous  Once 10/27/2016 1543 10/03/2016 1546      Medications:  Scheduled: . atorvastatin  40 mg Oral QHS  . budesonide (PULMICORT) nebulizer solution  0.5 mg Nebulization  BID  . chlorhexidine gluconate (MEDLINE KIT)  15 mL Mouth Rinse BID  . Chlorhexidine Gluconate Cloth  6 each Topical Daily  . cycloSPORINE  1 drop Both Eyes BID  . feeding supplement (ENSURE ENLIVE)  237 mL Oral BID BM  . heparin subcutaneous  5,000 Units Subcutaneous Q8H  . hydrocortisone sod succinate (SOLU-CORTEF) inj  50 mg Intravenous Q6H  . Influenza vac split quadrivalent PF  0.5 mL Intramuscular Tomorrow-1000  . ipratropium-albuterol  3 mL Nebulization Q6H  . lidocaine  1 patch Transdermal Daily  . mouth rinse  15 mL Mouth Rinse QID  . midodrine  5 mg Oral Q8H  . sodium chloride flush  10-40 mL Intracatheter Q12H    Objective: Vital signs in last 24 hours: Temp:  [96.8 F (36 C)-97.7 F (36.5 C)] 96.8 F  (36 C) (09/17 0836) Pulse Rate:  [96-120] 96 (09/17 0828) Resp:  [12-28] 13 (09/17 0828) BP: (60-149)/(35-129) 102/59 (09/17 0828) SpO2:  [70 %-100 %] 94 % (09/17 0836) FiO2 (%):  [50 %] 50 % (09/17 1135) Weight:  [97.5 kg (215 lb)] 97.5 kg (215 lb) (09/17 0530)   General appearance: no distress and syndromic appearance - no response Resp: diminished breath sounds anterior - bilateral Cardio: regular rate and rhythm GI: normal findings: bowel sounds normal and soft, non-tender Extremities: edema 3+ RLE, LLE wrapped.   Lab Results  Recent Labs  11/18/16 0355 11/19/16 0515  WBC 20.1* 18.5*  HGB 8.2* 8.0*  HCT 27.5* 26.5*  NA 140  139 141  K 4.4  4.4 4.4  CL 110  108 110  CO2 19*  20* 19*  BUN 43*  44* 48*  CREATININE 2.19*  2.21* 2.58*   Liver Panel  Recent Labs  11/17/16 0435 11/18/16 0355 11/19/16 0515  PROT 4.9*  --   --   ALBUMIN 2.1* 2.2* 2.4*  AST 94*  --   --   ALT 9*  --   --   ALKPHOS 119  --   --   BILITOT 1.4*  --   --    Sedimentation Rate No results for input(s): ESRSEDRATE in the last 72 hours. C-Reactive Protein No results for input(s): CRP in the last 72 hours.  Microbiology: Recent Results (from the past 240 hour(s))  Culture, blood (Routine X 2) w Reflex to ID Panel     Status: None   Collection Time: 11/09/16  7:02 PM  Result Value Ref Range Status   Specimen Description BLOOD RIGHT HAND  Final   Special Requests IN PEDIATRIC BOTTLE Blood Culture adequate volume  Final   Culture NO GROWTH 5 DAYS  Final   Report Status 11/14/2016 FINAL  Final  Culture, blood (Routine X 2) w Reflex to ID Panel     Status: None   Collection Time: 11/09/16  7:20 PM  Result Value Ref Range Status   Specimen Description BLOOD LEFT HAND  Final   Special Requests   Final    BOTTLES DRAWN AEROBIC ONLY Blood Culture adequate volume   Culture NO GROWTH 5 DAYS  Final   Report Status 11/14/2016 FINAL  Final  MRSA PCR Screening     Status: None    Collection Time: 11/10/16  2:01 AM  Result Value Ref Range Status   MRSA by PCR NEGATIVE NEGATIVE Final    Comment:        The GeneXpert MRSA Assay (FDA approved for NASAL specimens only), is one component of a comprehensive MRSA colonization  surveillance program. It is not intended to diagnose MRSA infection nor to guide or monitor treatment for MRSA infections.   Urine Culture     Status: None   Collection Time: 11/10/16  9:41 AM  Result Value Ref Range Status   Specimen Description URINE, CATHETERIZED  Final   Special Requests NONE  Final   Culture NO GROWTH  Final   Report Status 11/11/2016 FINAL  Final  Body fluid culture     Status: None   Collection Time: 11/10/16  1:39 PM  Result Value Ref Range Status   Specimen Description FLUID KIDNEY RIGHT  Final   Special Requests NONE  Final   Gram Stain   Final    ABUNDANT WBC PRESENT, PREDOMINANTLY MONONUCLEAR MODERATE GRAM NEGATIVE RODS    Culture   Final    ABUNDANT ESCHERICHIA COLI Confirmed Extended Spectrum Beta-Lactamase Producer (ESBL)    Report Status 11/13/2016 FINAL  Final   Organism ID, Bacteria ESCHERICHIA COLI  Final      Susceptibility   Escherichia coli - MIC*    AMPICILLIN >=32 RESISTANT Resistant     CEFAZOLIN >=64 RESISTANT Resistant     CEFEPIME >=64 RESISTANT Resistant     CEFTAZIDIME 16 INTERMEDIATE Intermediate     CEFTRIAXONE >=64 RESISTANT Resistant     CIPROFLOXACIN >=4 RESISTANT Resistant     GENTAMICIN <=1 SENSITIVE Sensitive     IMIPENEM <=0.25 SENSITIVE Sensitive     TRIMETH/SULFA >=320 RESISTANT Resistant     AMPICILLIN/SULBACTAM >=32 RESISTANT Resistant     PIP/TAZO 16 SENSITIVE Sensitive     Extended ESBL POSITIVE Resistant     * ABUNDANT ESCHERICHIA COLI    Studies/Results: Dg Chest Port 1 View  Result Date: 11/18/2016 CLINICAL DATA:  Acute respiratory failure EXAM: PORTABLE CHEST 1 VIEW COMPARISON:  November 17, 2016 FINDINGS: The left PICC line is stable terminating in the  SVC. A calcified nodule in the right lung is unchanged. Layering effusions and underlying opacities are similar in the interval. The cardiomediastinal silhouette is unchanged. No other change. IMPRESSION: 1. Left PICC line as above. 2. Moderate bilateral layering effusions and underlying opacities. No other changes. Electronically Signed   By: Dorise Bullion III M.D   On: 11/18/2016 08:06     Assessment/Plan: Earleen [place] Cell CA of R kidney Obstructive uropathy ESBL E coli UTI Femur fracture with L IM nail on 9-4  Total days of antibiotics: 19/72mrrem  she continues to require neo Midodrine, steroids will re-image her Kidney Her Cr is increasing WBC is stable She's been afebrile. On CPAP.  I/O +20L No change in merrem. Would aim for 21 days of merrem.          JBobby RumpfInfectious Diseases (pager) [phone] 11/19/2016, 12:14 PM  LOS: 21 days

## 2016-11-19 NOTE — Progress Notes (Signed)
PULMONARY / CRITICAL CARE MEDICINE   Name: Isabel Collins MRN: [id number] DOB: 02/11/1941    ADMISSION DATE:  10/05/2016   CONSULTATION DATE:  11/10/2016  REFERRING MD:  D. Grandville Silos, M.D. / [facility]  CHIEF COMPLAINT: Sepsis  HISTORY OF PRESENT ILLNESS:  76 y.o. 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 Patient underwent right ureteral stent placement on 6/25 and was subsequently scheduled for nephrectomy on 8/30 but had a fall with left femur fracture that was nailed on 9/4. Patient has developed an ESBL Escherichia coli infection with bacteremia. She did require nephrostomy tube placement on the right on 9/8. PCCM was asked to consult on 9/8 with tenuous medical status & multiple medical problems.  SUBJECTIVE:  Pt remains on BiPAP intermittently.  RN reports little to no PO inake, minimal UOP (foley or perc nephrostomy).      VITAL SIGNS: BP (!) 102/59   Pulse 96   Temp (!) 96.8 F (36 C)   Resp 13   Ht 5\' 2"  (1.575 m)   Wt 215 lb (97.5 kg)   SpO2 94%   BMI 39.32 kg/m   VENTILATOR SETTINGS: FiO2 (%):  [50 %] 50 %  INTAKE / OUTPUT: I/O last 3 completed shifts: In: 2812.7 [P.O.:720; I.V.:1062.7; Other:10; IV Piggyback:1020] Out: 140 [Urine:140]  PHYSICAL EXAMINATION: General: frail elderly female on BiPAP, appears uncomfortable   HEENT: MM pink/dry, poor dentition, BiPAP mask in place Neuro: awakens to voice, intermittently agitated with stimulation / pulls away from interventions, not oriented CV: s1s2 rrr, no m/r/g PULM: even/non-labored, lungs bilaterally clear anterior, diminished lateral/lower GI: soft, non-tender, bsx4 active  Extremities: warm/dry, 2-3+ generalized edema  Skin: no rashes or lesions   LABS:  BMET  Recent Labs Lab 11/17/16 0435 11/18/16 0355 11/19/16 0515  NA 141 140  139 141  K 4.0 4.4  4.4 4.4  CL 111 110  108 110  CO2 20* 19*  20* 19*  BUN 38* 43*  44* 48*  CREATININE 1.78* 2.19*  2.21*  2.58*  GLUCOSE 88 146*  142* 123*    Electrolytes  Recent Labs Lab 11/17/16 0435 11/18/16 0355 11/19/16 0515  CALCIUM 7.7* 8.1*  8.0* 8.3*  MG 2.3 2.3 2.4  PHOS 4.2 4.8* 4.5    CBC  Recent Labs Lab 11/17/16 0435 11/18/16 0355 11/19/16 0515  WBC 22.5* 20.1* 18.5*  HGB 8.4* 8.2* 8.0*  HCT 28.2* 27.5* 26.5*  PLT 216 194 177    Coag's No results for input(s): APTT, INR in the last 168 hours.  Sepsis Markers  Recent Labs Lab 11/12/16 1108  11/13/16 0331 11/14/16 0710  11/15/16 1318 11/16/16 0940 11/17/16 0431  LATICACIDVEN 2.4*  < >  --   --   < > 3.1* 2.9* 2.6*  PROCALCITON 0.32  --  0.65 2.69  --   --   --   --   < > = values in this interval not displayed.  ABG No results for input(s): PHART, PCO2ART, PO2ART in the last 168 hours.  Liver Enzymes  Recent Labs Lab 11/17/16 0435 11/18/16 0355 11/19/16 0515  AST 94*  --   --   ALT 9*  --   --   ALKPHOS 119  --   --   BILITOT 1.4*  --   --   ALBUMIN 2.1* 2.2* 2.4*    Cardiac Enzymes No results for input(s): TROPONINI, PROBNP in the last 168 hours.  Glucose  Recent Labs Lab 11/18/16 1123  11/18/16 1623 11/18/16 2012 11/18/16 2307 11/19/16 0318 11/19/16 0853  GLUCAP 120* 108* 113* 130* 124* 57*    Imaging No results found.  IMAGING/STUDIES: PORT CXR 9/10:   Worsening bilateral lung opacification compared with earlier chest x-ray imaging. TTE 9/12:  EF 47-82%, grade 1 diastolic dysfunction, trivial MR, no vegetation  Renal US 9/13 >> large complex mass associated with R kidney with a double-J catheter in place, moderate hydronephrosis, normal left kidney Renal US 9/17 >>  MICROBIOLOGY: Blood Cultures x2 8/12:  Negative Blood Cultures x2 8/27:  1/2 Positive ESBL E coli  Urine Culture 8/27:  ESBL E coli  MRSA PCR 8/27:  Negative  Blood Cultures x2 8/29:  Negative  Urine Culture 9/5: Negative  Blood Cultures x2 9/7 >>>NEG MRSA PCR 9/8:  Negative  Body Fluid Culture 9/8:  E coli   RPR 9/17 >>   ANTIBIOTICS: Merrem 8/27 >>  SIGNIFICANT EVENTS: 08/27 - Admit 09/08 - Right perc nephrostomy tube placement 09/10 - Transferred to ICU with worsening respiratory status 09/12 - Transitioned back to BiPAP overnight from [place] 5 L/m  ASSESSMENT / PLAN:  77 y.o. female with hx of recent IM nail on L for fracture, urothelial carcinoma admitted with obstructive uropathy s/p percutaneous nephrostomy on the right & subsequent E-Coli bacteremia and acute hypoxic respiratory failure.  Her respiratory status remains tenuous, worsening renal failure (not an HD candidate) and hypotension on neosynephrine / midodrine.    Acute hypoxic respiratory failure Pleural Effusions - bilateral, volume overload P:  Continue intermittent BiPAP Follow CXR  Do not feel intubation would be in her best interest given overall state of deconditioning.  Have contacted her emergency contact which is [employer].  Await return call. Wean O2 for sats > 90% Pulmonary hygiene- IS, mobilize as able  Duoneb Q6   Sepsis - secondary to E-coli bacteremia. Repeat blood cultures NEG. NO evidence vegetation on echo.  P: Monitor fever curve / WBC Continue meropenem per ID   Escherichia coli bacteremia P: Meropenem D18/21 abx  ID following  Shock - likely multifactorial with intravascular volume depletion P: Continue stress steroids for now Midodrine 10 mg Q8 Hold further fluids, KVO   Acute encephalopathy - suspect toxic metabolic encephalopathy, improving  P: PT efforts as able Minimize sedating medications  Assess RPR  Reduce fentanyl frequency  PRN haldol for agitation  Obstructive uropathy - in setting of urothelial carcinoma. Status post percutaneous nephrostomy tube. P: Per Urology  Monitor right perc nephrostomy drain   Lactic acidosis - suspect secondary to sepsis and poor clearance from acute renal failure P: Repeat lactate in am 9/18  Acute renal failure P: Trend  BMP / urinary output Replace electrolytes as indicated Avoid nephrotoxic agents, ensure adequate renal perfusion Repeat renal US pending   Anemia/thrombocytopenia P: Trend CBC  Monitor for bleeding   Left hip fracture - status post nail by orthopedics.  P: Per Ortho  Mobilize as able > last orders for no weight bearing on left leg    Hypocalcemia P: Monitor, corrects to 9.6    Moderate to severe protein-calorie malnutrition P: Dysphagia 1 Diet as tolerated  Aspiration precautions  SLP following  May need small bore feeding tube but this will present challenges with BiPAP use D10 at [place] of Care  P: Called the number listed in her emergency contact and it is the [employer].  I spoke with the staff and they are going to review her information  they have listed to see if there are any phone contacts for friends / family.  She unfortunately is not making progress and I am concerned that she is declining despite aggressive medical interventions.  SW consulted 9/16 to assist with establishment of court appointed guardian as she is not able to make decisions for herself currently.    Prophylaxis:  SCDs.  Diet:  Cleared by speech for Dysphagia 1 diet. (on hold now due to her acute encephalopathy) Code Status:  Full Code. Disposition:  Remains critically ill in the ICU. Family Update:  No family available on NP rounding 9/16.  She reportedly has no family and is homeless.  Social work consulted to establish legal guardian for patient.    Noe Gens, NP-C [facility] Pgr: [phone] or if no answer [phone] 11/19/2016, 10:22 AM  Attending Addendum: I personally examined this patient and agree with plan as detailed above. Briefly this is a  89yoF with ESBL Ecoli bacteremia related to obstructive uropathy and UTI. Blood cultures now no growth. Continue Meropenem. Continues to have shock presumed to be septic shock. Continue Hydrocort.  Cortisol normal on 9/15 AM. Procalcitonin is rising (now 12.57 up from 2.69), but patient remains afebrile. Leukocytosis stable to mildly improved. AKI continues to worsen, and Nephrology has found her not to be a dialysis candidate due to her cormorbidities of Urothelial cancer and Severe hypoalbuminemia. Continues to have acute hypoxic respiratory failure due to pulmonary edema and pleural effusions. Continues to require BIPAP for this; desaturates on face mask. Given her hypoxia and her significant anasarca, will try to hold off on further IVF's. Acute encephalopathy, likely due to delirium in setting of sepsis. ABG showed no hypercapnea; Ammonia wnl. Avoid Benzos; give haldol PRN agitation. Severe protein-calorie malnutrition, for which she may need an NG tube. However this will make it more difficult to effectively ventilate her on the BIPAP. Awaiting for social work to help [place] track down some family for her to discuss goals of care.   60 minutes critical care time  Vernie Murders, MD Pulmonary & Critical Care

## 2016-11-19 NOTE — Progress Notes (Signed)
Spoke with Isabel Collins from [employer].  She reviewed the information they had on record and discussed with Isabel Collins [employer] engineer, [employer]) and Mrs. Thies does not have any family per records.  Will proceed with establishment of court appointed guardian.  Unfortunately due to her tenuous state, we may not have enough time for a system appointed to be established.  Will also request an ethics consult for case review.   Noe Gens, NP-C [place] [facility] Pgr: [phone] or if no answer [phone] 11/19/2016, 3:24 PM

## 2016-11-19 NOTE — Progress Notes (Addendum)
Physical Therapy Discharge Patient Details Name: Isabel Collins MRN: [id number] DOB: 10-Sep-1940 Today's Date: 11/19/2016 Time:  -     Patient discharged from PT services secondary to medical decline - will need to re-order PT to resume therapy services. If patient's status improves please reorder PT services.  Please see latest therapy progress note for current level of functioning and progress toward goals.    Progress and discharge plan discussed with patient and/or caregiver: Patient unable to participate in discharge planning and no caregivers available  GP    Benjamine Mola B. Migdalia Dk PT, DPT Acute Rehabilitation  [phone] Pager [phone]   [place] 11/19/2016, 2:02 PM

## 2016-11-19 NOTE — Progress Notes (Signed)
SLP Cancellation Note  Patient Details Name: Isabel Collins MRN: [id number] DOB: [birth year]   Cancelled treatment:        Pt on Bipap. Will continue to follow for safety and efficiency with full liquid diet.    [place] Siren 11/19/2016, 10:34 AM   Orbie Pyo Colvin Caroli.Ed [employer] [phone]

## 2016-11-19 NOTE — Progress Notes (Signed)
PT Cancellation Note  Patient Details Name: Isabel Collins MRN: [id number] DOB: [birth year]   Cancelled Treatment:    Reason Eval/Treat Not Completed: Medical issues which prohibited therapy Pt has returned to BiPAP and is not able to participate in therapy at this time. PT to discharge pt, when pt is appropriate for PT service please reorder PT evaluation. Thank you.  Rucker Pridgeon B. Migdalia Dk PT, DPT Acute Rehabilitation  [phone] Pager [phone]   [place] 11/19/2016, 2:05 PM

## 2016-11-20 ENCOUNTER — Inpatient Hospital Stay (HOSPITAL_COMMUNITY): Payer: Medicare Other

## 2016-11-20 LAB — GLUCOSE, CAPILLARY
GLUCOSE-CAPILLARY: 56 mg/dL — AB (ref 65–99)
GLUCOSE-CAPILLARY: 122 mg/dL — AB (ref 65–99)
GLUCOSE-CAPILLARY: 86 mg/dL (ref 65–99)
Glucose-Capillary: 102 mg/dL — ABNORMAL HIGH (ref 65–99)
Glucose-Capillary: 109 mg/dL — ABNORMAL HIGH (ref 65–99)
Glucose-Capillary: 114 mg/dL — ABNORMAL HIGH (ref 65–99)
Glucose-Capillary: 121 mg/dL — ABNORMAL HIGH (ref 65–99)
Glucose-Capillary: 72 mg/dL (ref 65–99)

## 2016-11-20 LAB — CBC WITH DIFFERENTIAL/PLATELET
BASOS ABS: 0 10*3/uL (ref 0.0–0.1)
Basophils Relative: 0 %
Eosinophils Absolute: 0 10*3/uL (ref 0.0–0.7)
Eosinophils Relative: 0 %
HCT: 27.3 % — ABNORMAL LOW (ref 36.0–46.0)
Hemoglobin: 8.1 g/dL — ABNORMAL LOW (ref 12.0–15.0)
LYMPHS ABS: 1.2 10*3/uL (ref 0.7–4.0)
Lymphocytes Relative: 6 %
MCH: 26.2 pg (ref 26.0–34.0)
MCHC: 29.7 g/dL — ABNORMAL LOW (ref 30.0–36.0)
MCV: 88.3 fL (ref 78.0–100.0)
MONO ABS: 0.6 10*3/uL (ref 0.1–1.0)
Monocytes Relative: 3 %
Neutro Abs: 17.4 10*3/uL — ABNORMAL HIGH (ref 1.7–7.7)
Neutrophils Relative %: 91 %
PLATELETS: 178 10*3/uL (ref 150–400)
RBC: 3.09 MIL/uL — AB (ref 3.87–5.11)
RDW: 23.2 % — AB (ref 11.5–15.5)
WBC: 19.2 10*3/uL — AB (ref 4.0–10.5)

## 2016-11-20 LAB — LACTIC ACID, PLASMA: Lactic Acid, Venous: 2.9 mmol/L (ref 0.5–1.9)

## 2016-11-20 LAB — RENAL FUNCTION PANEL
Albumin: 2.4 g/dL — ABNORMAL LOW (ref 3.5–5.0)
Anion gap: 11 (ref 5–15)
BUN: 51 mg/dL — ABNORMAL HIGH (ref 6–20)
CO2: 19 mmol/L — ABNORMAL LOW (ref 22–32)
Calcium: 8.5 mg/dL — ABNORMAL LOW (ref 8.9–10.3)
Chloride: 110 mmol/L (ref 101–111)
Creatinine, Ser: 2.89 mg/dL — ABNORMAL HIGH (ref 0.44–1.00)
GFR calc Af Amer: 17 mL/min — ABNORMAL LOW (ref >60)
GFR calc non Af Amer: 15 mL/min — ABNORMAL LOW (ref >60)
GLUCOSE: 115 mg/dL — AB (ref 65–99)
POTASSIUM: 4.6 mmol/L (ref 3.5–5.1)
Phosphorus: 4.9 mg/dL — ABNORMAL HIGH (ref 2.5–4.6)
SODIUM: 140 mmol/L (ref 135–145)

## 2016-11-20 LAB — GC/CHLAMYDIA PROBE AMP (~~LOC~~) NOT AT ARMC
Chlamydia: NEGATIVE
NEISSERIA GONORRHEA: NEGATIVE

## 2016-11-20 LAB — RPR: RPR: NONREACTIVE

## 2016-11-20 LAB — MAGNESIUM: MAGNESIUM: 2.6 mg/dL — AB (ref 1.7–2.4)

## 2016-11-20 LAB — PROCALCITONIN: Procalcitonin: 9.52 ng/mL

## 2016-11-20 MED ORDER — IPRATROPIUM-ALBUTEROL 0.5-2.5 (3) MG/3ML IN SOLN
3.0000 mL | Freq: Three times a day (TID) | RESPIRATORY_TRACT | Status: DC
  Administered 2016-11-21 – 2016-11-24 (×12): 3 mL via RESPIRATORY_TRACT
  Filled 2016-11-20 (×12): qty 3

## 2016-11-20 MED ORDER — DEXTROSE 5 % IV SOLN
4.0000 mg/h | INTRAVENOUS | Status: DC
Start: 2016-11-20 — End: 2016-11-21
  Administered 2016-11-20: 4 mg/h via INTRAVENOUS
  Filled 2016-11-20: qty 25

## 2016-11-20 MED ORDER — INFLUENZA VAC SPLIT HIGH-DOSE 0.5 ML IM SUSY
0.5000 mL | PREFILLED_SYRINGE | INTRAMUSCULAR | Status: DC
  Filled 2016-11-20: qty 0.5

## 2016-11-20 MED ORDER — LIDOCAINE VISCOUS 2 % MT SOLN
OROMUCOSAL | Status: AC
  Filled 2016-11-20: qty 15

## 2016-11-20 MED ORDER — INFLUENZA VAC SPLIT HIGH-DOSE 0.5 ML IM SUSY
0.5000 mL | PREFILLED_SYRINGE | Freq: Once | INTRAMUSCULAR | Status: AC
  Administered 2016-11-20: 0.5 mL via INTRAMUSCULAR
  Filled 2016-11-20: qty 0.5

## 2016-11-20 MED ORDER — JEVITY 1.2 CAL PO LIQD
1000.0000 mL | ORAL | Status: DC
  Administered 2016-11-20: 1000 mL
  Filled 2016-11-20 (×4): qty 1000

## 2016-11-20 MED ORDER — IOPAMIDOL (ISOVUE-300) INJECTION 61%
INTRAVENOUS | Status: AC
  Administered 2016-11-20: 30 mL
  Filled 2016-11-20: qty 50

## 2016-11-20 MED ORDER — PRO-STAT SUGAR FREE PO LIQD: 30.0000 mL | Freq: Two times a day (BID) | ORAL | Status: DC

## 2016-11-20 MED ORDER — FUROSEMIDE 10 MG/ML IJ SOLN
20.0000 mg | Freq: Once | INTRAMUSCULAR | Status: AC
  Administered 2016-11-20: 20 mg via INTRAVENOUS
  Filled 2016-11-20: qty 2

## 2016-11-20 MED ORDER — QUETIAPINE FUMARATE 25 MG PO TABS
25.0000 mg | ORAL_TABLET | Freq: Every day | ORAL | Status: DC
  Administered 2016-11-20: 25 mg via NASOGASTRIC
  Filled 2016-11-20: qty 1

## 2016-11-20 MED ORDER — IOPAMIDOL (ISOVUE-300) INJECTION 61%
50.0000 mL | Freq: Once | INTRAVENOUS | Status: AC | PRN
  Administered 2016-11-20: 30 mL

## 2016-11-20 MED ORDER — LIDOCAINE VISCOUS 2 % MT SOLN
15.0000 mL | Freq: Once | OROMUCOSAL | Status: AC
Start: 2016-11-20 — End: 2016-11-20
  Administered 2016-11-20: 6 mL via OROMUCOSAL
  Filled 2016-11-20: qty 15

## 2016-11-20 MED ORDER — VITAL HIGH PROTEIN PO LIQD: 1000.0000 mL | ORAL | Status: DC

## 2016-11-20 MED ORDER — HALOPERIDOL LACTATE 5 MG/ML IJ SOLN
INTRAMUSCULAR | Status: AC
  Filled 2016-11-20: qty 1

## 2016-11-20 MED ORDER — JEVITY 1.2 CAL PO LIQD
1000.0000 mL | ORAL | Status: DC
  Filled 2016-11-20 (×2): qty 1000

## 2016-11-20 MED ORDER — HALOPERIDOL LACTATE 5 MG/ML IJ SOLN
2.0000 mg | Freq: Four times a day (QID) | INTRAMUSCULAR | Status: DC | PRN
  Administered 2016-11-20 – 2016-11-21 (×3): 2 mg via INTRAVENOUS
  Filled 2016-11-20 (×2): qty 1

## 2016-11-20 MED ORDER — DEXTROSE 50 % IV SOLN
25.0000 mL | Freq: Once | INTRAVENOUS | Status: AC
  Administered 2016-11-20: 25 mL via INTRAVENOUS

## 2016-11-20 NOTE — Progress Notes (Addendum)
Nutrition Consult/Follow Up  DOCUMENTATION CODES:   Not applicable  INTERVENTION:    Once NGT placed per IR, initiate Nepro at 10 ml/hr and increase by 10 ml every 6 hours to goal rate of 40 ml/hr  TF regimen to provide 1728 kcals, 78 gm protein, 698 ml of free water daily  NUTRITION DIAGNOSIS:   Inadequate oral intake related to  (acute encephalopathy ) as evidenced by NPO status  Ongoing   GOAL:   Patient will meet greater than or equal to 90% of their needs  Currently unmet   MONITOR:   TF tolerance, Labs, Weight trends, Skin, I & O's  ASSESSMENT:   Isabel Collins is a 76 y.o. female with medical history significant of COPD, diabetes mellitus, hypertension, hyperlipidemia, recent diagnosis of right upper pole transitional cell carcinoma, seen recently by urology with recent ureteroscopy/biopsy and stent, supposed to have nephrectomy 3 days from now, presents to the emergency room from her skilled nursing home after having a fall and injuring her left knee.   Pt admitted with acute displaced fracture of left femur. S/P intramedullary retrograde femoral nailing 9/4. S/p R nephrostomy placement per IR 9/8.  Pt sitting up in chair at time of visit. Taken off BiPAP. Somnolent with safety mittens on. Now NPO (previously on Full Liquids). Spoke with Dr. Jimmey Ralph. Plan is for small bore feeding tube placement. Labs and medications reviewed. Mg 2.6 (H). Phos 4.9 (H). CBG's [id number].  Diet Order:  Diet NPO time specified  Skin:   (closed lt thigh incision)  Last BM:  9/17  Intake/Output Summary (Last 24 hours) at 11/20/16 1248 Last data filed at 11/20/16 1200  Gross per 24 hour  Intake            301.6 ml  Output              105 ml  Net            196.6 ml   Height:   Ht Readings from Last 1 Encounters:  11/05/2016 5\' 2"  (1.575 m)   Weight:   Wt Readings from Last 1 Encounters:  11/20/16 202 lb (91.6 kg)  Admit wt         144 lb (65.3 kg)  Ideal Body  Weight:  50 kg  BMI:  Body mass index is 36.95 kg/m. highly skewed  Estimated Nutritional Needs:   Kcal:  1650-1850  Protein:  80-95 gm  Fluid:  1.6-1.8 L  EDUCATION NEEDS:   No education needs identified at this time  Arthur Holms, RD, LDN Pager #: [phone] After-Hours Pager #: [phone]

## 2016-11-20 NOTE — Progress Notes (Signed)
Occupational Therapy Discharge Patient Details Name: Isabel Collins MRN: [id number] DOB: 10/20/1940 Today's Date: 11/20/2016 Time:  -     Patient discharged from OT services secondary to medical decline - will need to re-order OT to resume therapy services. If pt's status improves please reorder OT services.   Please see latest therapy progress note for current level of functioning and progress toward goals.    Progress and discharge plan discussed with patient and/or caregiver: Patient unable to participate in discharge planning and no caregivers available  [place] A Arwyn Besaw 11/20/2016, 8:57 AM

## 2016-11-20 NOTE — Progress Notes (Signed)
Came to room for [facility] tx, noted pt already off bipap and on 6 lpm [place].  No distress noted, pt appears agitated.

## 2016-11-20 NOTE — Progress Notes (Signed)
INFECTIOUS DISEASE PROGRESS NOTE  ID: Isabel Collins is a 76 y.o. female with  Principal Problem:   Acute respiratory distress Active Problems:   Hypertension   Physical deconditioning   Hypothyroidism   Chronic obstructive pulmonary disease (HCC)   Type 2 diabetes mellitus with complication, without long-term current use of insulin (HCC)   Urothelial carcinoma of kidney, right (HCC)   Gastroesophageal reflux disease   History of recent fall   Right renal mass   Fracture of knee prosthesis (HCC)   AKI (acute kidney injury) ([place])   ESBL (extended spectrum beta-lactamase) producing bacteria infection   Closed comminuted supracondylar fracture of left femur (HCC)   Renal failure   Fall   Sepsis due to Escherichia coli (E. coli) (HCC)   Gram-negative bacteremia   Sepsis secondary to UTI ([place])   Transitional cell carcinoma (HCC)   Hypervolemia   HCAP (healthcare-associated pneumonia)   Leukocytosis   Encephalopathy acute   Hydronephrosis of right kidney   Abnormal CT scan, pelvis: 11/09/2016   Closed fracture of left distal femur (HCC)   Pleural effusion on right   Hypoalbuminemia   Acute renal failure (ARF) (HCC)   Acute respiratory failure with hypoxia (HCC)  Subjective: Onto nasal canula this AM.  Responds to voice, questions.   Abtx:  Anti-infectives    Start     Dose/Rate Route Frequency Ordered Stop   11/19/16 1700  meropenem (MERREM) 500 mg in sodium chloride 0.9 % 50 mL IVPB     500 mg 100 mL/hr over 30 Minutes Intravenous Every 12 hours 11/19/16 0926 11/22/16 0459   11/07/16 0900  ceFAZolin (ANCEF) IVPB 2g/100 mL premix  Status:  Discontinued     2 g 200 mL/hr over 30 Minutes Intravenous To Short Stay 11/07/16 0809 11/07/16 0934   11/02/16 0700  ceFAZolin (ANCEF) IVPB 2g/100 mL premix     2 g 200 mL/hr over 30 Minutes Intravenous To Short Stay 10/30/16 1212 11/03/16 0700   11/02/16 0700  gentamicin (GARAMYCIN) 280 mg in dextrose 5 % 100 mL IVPB  Status:   Discontinued     5 mg/kg  56.2 kg (Adjusted) 107 mL/hr over 60 Minutes Intravenous 30 min pre-op 11/01/16 0932 11/02/16 1006   10/30/16 1000  fluconazole (DIFLUCAN) tablet 100 mg  Status:  Discontinued     100 mg Oral Daily 10/23/2016 1841 11/12/16 0926   10/30/16 0600  ceFAZolin (ANCEF) IVPB 2g/100 mL premix  Status:  Discontinued     2 g 200 mL/hr over 30 Minutes Intravenous On call to O.R. 10/18/2016 1925 10/30/16 1212   10/30/16 0500  meropenem (MERREM) 1 g in sodium chloride 0.9 % 100 mL IVPB  Status:  Discontinued     1 g 200 mL/hr over 30 Minutes Intravenous Every 12 hours 10/13/2016 1632 11/19/16 0926   10/25/2016 1630  meropenem (MERREM) 1 g in sodium chloride 0.9 % 100 mL IVPB     1 g 200 mL/hr over 30 Minutes Intravenous STAT 10/10/2016 1616 10/18/2016 1919   10/15/2016 1545  vancomycin (VANCOCIN) IVPB 1000 mg/200 mL premix     1,000 mg 200 mL/hr over 60 Minutes Intravenous  Once 10/07/2016 1543 10/05/2016 1727   10/09/2016 1545  piperacillin-tazobactam (ZOSYN) IVPB 3.375 g  Status:  Discontinued     3.375 g 100 mL/hr over 30 Minutes Intravenous  Once 10/19/2016 1543 10/15/2016 1546      Medications:  Scheduled: . atorvastatin  40 mg Oral QHS  . budesonide (PULMICORT)  nebulizer solution  0.5 mg Nebulization BID  . chlorhexidine gluconate (MEDLINE KIT)  15 mL Mouth Rinse BID  . Chlorhexidine Gluconate Cloth  6 each Topical Daily  . cycloSPORINE  1 drop Both Eyes BID  . feeding supplement (PRO-STAT SUGAR FREE 64)  30 mL Per Tube BID  . feeding supplement (VITAL HIGH PROTEIN)  1,000 mL Per Tube Q24H  . furosemide  20 mg Intravenous Once  . heparin subcutaneous  5,000 Units Subcutaneous Q8H  . hydrocortisone sod succinate (SOLU-CORTEF) inj  50 mg Intravenous Q6H  . Influenza vac split quadrivalent PF  0.5 mL Intramuscular Tomorrow-1000  . ipratropium-albuterol  3 mL Nebulization Q6H  . lidocaine  1 patch Transdermal Daily  . mouth rinse  15 mL Mouth Rinse QID  . midodrine  5 mg Oral Q8H  .  QUEtiapine  25 mg Per NG tube QHS  . sodium chloride flush  10-40 mL Intracatheter Q12H    Objective: Vital signs in last 24 hours: Temp:  [96 F (35.6 C)-101.1 F (38.4 C)] 96 F (35.6 C) (09/18 0837) Pulse Rate:  [104-111] 104 (09/18 0431) Resp:  [10-26] 18 (09/18 0600) BP: (84-128)/(44-82) 112/62 (09/18 0600) SpO2:  [76 %-100 %] 91 % (09/18 0923) FiO2 (%):  [50 %] 50 % (09/18 0700) Weight:  [91.6 kg (202 lb)] 91.6 kg (202 lb) (09/18 0431)   General appearance: mild distress Resp: diminished breath sounds anterior - bilateral Cardio: regular rate and rhythm GI: normal findings: bowel sounds normal and soft, non-tender  Lab Results  Recent Labs  11/18/16 0355 11/19/16 0515 11/20/16 0259  WBC 20.1* 18.5*  --   HGB 8.2* 8.0*  --   HCT 27.5* 26.5*  --   NA 140  139 141 140  K 4.4  4.4 4.4 4.6  CL 110  108 110 110  CO2 19*  20* 19* 19*  BUN 43*  44* 48* 51*  CREATININE 2.19*  2.21* 2.58* 2.89*   Liver Panel  Recent Labs  11/19/16 0515 11/20/16 0259  ALBUMIN 2.4* 2.4*   Sedimentation Rate No results for input(s): ESRSEDRATE in the last 72 hours. C-Reactive Protein No results for input(s): CRP in the last 72 hours.  Microbiology: Recent Results (from the past 240 hour(s))  Body fluid culture     Status: None   Collection Time: 11/10/16  1:39 PM  Result Value Ref Range Status   Specimen Description FLUID KIDNEY RIGHT  Final   Special Requests NONE  Final   Gram Stain   Final    ABUNDANT WBC PRESENT, PREDOMINANTLY MONONUCLEAR MODERATE GRAM NEGATIVE RODS    Culture   Final    ABUNDANT ESCHERICHIA COLI Confirmed Extended Spectrum Beta-Lactamase Producer (ESBL)    Report Status 11/13/2016 FINAL  Final   Organism ID, Bacteria ESCHERICHIA COLI  Final      Susceptibility   Escherichia coli - MIC*    AMPICILLIN >=32 RESISTANT Resistant     CEFAZOLIN >=64 RESISTANT Resistant     CEFEPIME >=64 RESISTANT Resistant     CEFTAZIDIME 16 INTERMEDIATE  Intermediate     CEFTRIAXONE >=64 RESISTANT Resistant     CIPROFLOXACIN >=4 RESISTANT Resistant     GENTAMICIN <=1 SENSITIVE Sensitive     IMIPENEM <=0.25 SENSITIVE Sensitive     TRIMETH/SULFA >=320 RESISTANT Resistant     AMPICILLIN/SULBACTAM >=32 RESISTANT Resistant     PIP/TAZO 16 SENSITIVE Sensitive     Extended ESBL POSITIVE Resistant     * ABUNDANT ESCHERICHIA COLI  Studies/Results: US Renal  Result Date: 11/19/2016 CLINICAL DATA:  Advanced right urothelial cancer with severe right hydronephrosis, status post right nephrostomy and right ureteral stent. Urosepsis. EXAM: RENAL / URINARY TRACT ULTRASOUND COMPLETE COMPARISON:  11/15/2016, 09/2016 FINDINGS: Right Kidney: Length: 11.8 cm. Large complex ill-defined 10.9 x 6.7 x 2.1 cm mass. Ureteral stent visualized. Similar appearing mild-to-moderate hydronephrosis. Suspect echogenic shadowing air within the collecting system. Left Kidney: Length: 10.5 cm. No hydronephrosis. Limited assessment because of obscuring bowel gas. Bladder: Collapsed by Foley.  Not visualized. Pleural effusions present bilaterally. Upper abdominal ascites also noted. IMPRESSION: Large complex right renal mass. Double-J stent is visualized. Suspect similar mild to moderate residual right hydronephrosis. No significant change compared 11/15/2016. Limited visualization but grossly normal left kidney without hydronephrosis. Collapsed bladder Bilateral pleural effusions and a small amount of upper abdominal ascites Electronically Signed   By: Jerilynn Mages.  Shick M.D.   On: 11/19/2016 17:04   Dg Chest Port 1 View  Result Date: 11/20/2016 CLINICAL DATA:  Acute respiratory failure EXAM: PORTABLE CHEST 1 VIEW COMPARISON:  11/18/2016 FINDINGS: Cardiac shadow remains stable. Left-sided PICC line is again seen. Bilateral pleural effusions are noted right greater than left accentuated likely related to the positioning of the patient. No focal infiltrate is seen. Some likely basilar  atelectasis is present. IMPRESSION: Bilateral effusions at likely underlying basilar atelectasis. The appearance is worsened when compare with the prior exam but may be positional in nature. Electronically Signed   By: Inez Catalina M.D.   On: 11/20/2016 07:55     Assessment/Plan: Transitoinal Cell CA of R kidney Obstructive uropathy ESBL E coli UTI Repeat u/s is unchanged.  Femur fracture with L IM nail on 9-4 Pleural effusions  Total days of antibiotics: 19/14mrrem  she continues to require neo Midodrine, steroids Her Cr is increasing WBC is stable She's been afebrile. On CPAP.  I/O +18.5L No change in merrem. Would aim for 21 days of merrem. End date 9-20 Goals of care Available as needed.         JBobby RumpfInfectious Diseases (pager) [phone] 11/20/2016, 10:47 AM  LOS: 22 days

## 2016-11-20 NOTE — Progress Notes (Signed)
OT Cancellation Note  Patient Details Name: Isabel Collins MRN: [id number] DOB: 03-08-1940   Cancelled Treatment:    Reason Eval/Treat Not Completed: Medical issues which prohibited therapy. Discussed with RN. Pt with significant medical decline and no longer appropriate for participation with OT. OT will sign off. Please reorder if needs change. Thank you.   Norman Herrlich, MS OTR/L  Pager: [phone]   Norman Herrlich 11/20/2016, 8:56 AM

## 2016-11-20 NOTE — Progress Notes (Signed)
SLP Cancellation Note  Patient Details Name: Isabel Collins MRN: [id number] DOB: 1940/07/31   Cancelled treatment:        Pt now NPO. Will follow up with MD tomorrow for plan.   [place] Siren 11/20/2016, 4:54 PM  Orbie Pyo Colvin Caroli.Ed [employer] [phone]

## 2016-11-20 NOTE — Progress Notes (Signed)
PULMONARY / CRITICAL CARE MEDICINE   Name: Isabel Collins MRN: [id number] DOB: 1940/03/19    ADMISSION DATE:  10/28/2016   HISTORY OF PRESENT ILLNESS:  76 y.o. 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 Patient underwent right ureteral stent placement on 6/25 and was subsequently scheduled for nephrectomy on 8/30 but had a fall with left femur fracture that was nailed on 9/4. Patient has developed an ESBL Escherichia coli infection with bacteremia. She did require nephrostomy tube placement on the right on 9/8. PCCM was asked to consult on 9/8 with tenuous medical status & multiple medical problems.  SUBJECTIVE:  Patient removed from BIPAP this AM and placed on 6L O2 which she seems to be tolerating well. Had some agitation overnight and received Haldol once. Continues to have minimal UOP (foley or perc nephrostomy). This AM she is sitting up in chair at bedside with eyes closed, opens eyes to voice and mumbles to me. Too somnolent to safely eat.   VITAL SIGNS: BP 112/62   Pulse (!) 104   Temp (!) 96 F (35.6 C) (Axillary)   Resp 18   Ht 5\' 2"  (1.575 m)   Wt 91.6 kg (202 lb)   SpO2 91%   BMI 36.95 kg/m   VENTILATOR SETTINGS: FiO2 (%):  [50 %] 50 %  INTAKE / OUTPUT: I/O last 3 completed shifts: In: 912.8 [P.O.:180; I.V.:482.8; IV Piggyback:250] Out: 155 [Urine:155]  PHYSICAL EXAMINATION: General: frail elderly female, on 6L O2, sitting in recliner at bedside.    HEENT: MM pink/dry, poor dentition, Neuro: somnolent, awakens to voice, mumbles incoherently in response to questions. Is not oriented. Not obeying commands. CV: s1s2 rrr, no m/r/g PULM: even/non-labored, lungs bilaterally clear anterior, diminished lateral/lower GI: soft, non-tender, bsx4 active  Extremities: warm/dry, 2-3+ generalized edema / anasarca Skin: no rashes or lesions   LABS:  BMET  Recent Labs Lab 11/18/16 0355 11/19/16 0515 11/20/16 0259  NA 140   139 141 140  K 4.4  4.4 4.4 4.6  CL 110  108 110 110  CO2 19*  20* 19* 19*  BUN 43*  44* 48* 51*  CREATININE 2.19*  2.21* 2.58* 2.89*  GLUCOSE 146*  142* 123* 115*    Electrolytes  Recent Labs Lab 11/18/16 0355 11/19/16 0515 11/20/16 0259  CALCIUM 8.1*  8.0* 8.3* 8.5*  MG 2.3 2.4 2.6*  PHOS 4.8* 4.5 4.9*    CBC  Recent Labs Lab 11/17/16 0435 11/18/16 0355 11/19/16 0515  WBC 22.5* 20.1* 18.5*  HGB 8.4* 8.2* 8.0*  HCT 28.2* 27.5* 26.5*  PLT 216 194 177    Coag's No results for input(s): APTT, INR in the last 168 hours.  Sepsis Markers  Recent Labs Lab 11/14/16 0710  11/16/16 0940 11/17/16 0431 11/19/16 1002 11/20/16 0259  LATICACIDVEN  --   < > 2.9* 2.6*  --  2.9*  PROCALCITON 2.69  --   --   --  12.57 9.52  < > = values in this interval not displayed.  ABG No results for input(s): PHART, PCO2ART, PO2ART in the last 168 hours.  Liver Enzymes  Recent Labs Lab 11/17/16 0435 11/18/16 0355 11/19/16 0515 11/20/16 0259  AST 94*  --   --   --   ALT 9*  --   --   --   ALKPHOS 119  --   --   --   BILITOT 1.4*  --   --   --   ALBUMIN 2.1*  2.2* 2.4* 2.4*    Cardiac Enzymes No results for input(s): TROPONINI, PROBNP in the last 168 hours.  Glucose  Recent Labs Lab 11/19/16 2043 11/19/16 2334 11/20/16 0017 11/20/16 0408 11/20/16 0437 11/20/16 0833  GLUCAP 98 54* 109* 56* 122* 102*    Imaging US Renal  Result Date: 11/19/2016 CLINICAL DATA:  Advanced right urothelial cancer with severe right hydronephrosis, status post right nephrostomy and right ureteral stent. Urosepsis. EXAM: RENAL / URINARY TRACT ULTRASOUND COMPLETE COMPARISON:  11/15/2016, 09/2016 FINDINGS: Right Kidney: Length: 11.8 cm. Large complex ill-defined 10.9 x 6.7 x 2.1 cm mass. Ureteral stent visualized. Similar appearing mild-to-moderate hydronephrosis. Suspect echogenic shadowing air within the collecting system. Left Kidney: Length: 10.5 cm. No hydronephrosis. Limited  assessment because of obscuring bowel gas. Bladder: Collapsed by Foley.  Not visualized. Pleural effusions present bilaterally. Upper abdominal ascites also noted. IMPRESSION: Large complex right renal mass. Double-J stent is visualized. Suspect similar mild to moderate residual right hydronephrosis. No significant change compared 11/15/2016. Limited visualization but grossly normal left kidney without hydronephrosis. Collapsed bladder Bilateral pleural effusions and a small amount of upper abdominal ascites Electronically Signed   By: Jerilynn Mages.  Shick M.D.   On: 11/19/2016 17:04   Dg Chest Port 1 View  Result Date: 11/20/2016 CLINICAL DATA:  Acute respiratory failure EXAM: PORTABLE CHEST 1 VIEW COMPARISON:  11/18/2016 FINDINGS: Cardiac shadow remains stable. Left-sided PICC line is again seen. Bilateral pleural effusions are noted right greater than left accentuated likely related to the positioning of the patient. No focal infiltrate is seen. Some likely basilar atelectasis is present. IMPRESSION: Bilateral effusions at likely underlying basilar atelectasis. The appearance is worsened when compare with the prior exam but may be positional in nature. Electronically Signed   By: Inez Catalina M.D.   On: 11/20/2016 07:55    IMAGING/STUDIES: PORT CXR 9/10:   Worsening bilateral lung opacification compared with earlier chest x-ray imaging. TTE 9/12:  EF 85-46%, grade 1 diastolic dysfunction, trivial MR, no vegetation  Renal US 9/13 >> large complex mass associated with R kidney with a double-J catheter in place, moderate hydronephrosis, normal left kidney Renal US 9/17 >>  MICROBIOLOGY: Blood Cultures x2 8/12:  Negative Blood Cultures x2 8/27:  1/2 Positive ESBL E coli  Urine Culture 8/27:  ESBL E coli  MRSA PCR 8/27:  Negative  Blood Cultures x2 8/29:  Negative  Urine Culture 9/5: Negative  Blood Cultures x2 9/7 >>>NEG MRSA PCR 9/8:  Negative  Body Fluid Culture 9/8:  E coli  RPR 9/17 >>    ANTIBIOTICS: Merrem 8/27 >>  SIGNIFICANT EVENTS: 08/27 - Admit 09/08 - Right perc nephrostomy tube placement 09/10 - Transferred to ICU with worsening respiratory status 09/12 - Transitioned back to BiPAP overnight from [place] 5 L/m  ASSESSMENT / PLAN:  76 y.o. female with hx of recent IM nail on L for fracture, urothelial carcinoma admitted with obstructive uropathy s/p percutaneous nephrostomy on the right & subsequent E-Coli bacteremia and acute hypoxic respiratory failure.  Her respiratory status remains tenuous, worsening renal failure (not an HD candidate) and hypotension on neosynephrine / midodrine.   PULMONARY: 1. Acute hypoxic respiratory failure 2. Pleural Effusions - bilateral, volume overload - off BIPAP this AM; Pox 93% on 6L O2 with no respiratory distress.  - since BP is improved and off vasopressors, will try gentle diuresis.  - repeat CXR in AM - Do not feel intubation would be in her best interest given overall state of deconditioning.  Have  contacted her emergency contact which is [employer].  Await return call. Social work is trying to find family and starting the process for guardianship - Given her hypoxia and her significant anasarca, will try to hold off on further IVF's. - Wean O2 for sats > 90% - Pulmonary hygiene- IS, mobilize as able  - Duoneb Q6   INFECTIOUS: 1. Septic shock; due to ESBL Ecoli bacteremia and UTI with Obstructive uropathy - shock resolved with vasopressors stopped 9/17 at 3pm - bacteremia has cleared with repeat blood cultures no growth - no vegetation on echo - continue meropenem (day 19 of 21); appreciate ID rec's - Procalcitonin increased from 2.69 to 12.57 but now down to 9.52 - Continue stress steroids and midodrine 10 mg Q8  NEUROLOGIC: 1. Acute encephalopathy - likely due to delirium with component of metabolic encephalopathy, improving. - no hypercapnea on ABG; LFT's ok; Ammonia wnl.  - received haldol overnight  for agitation; start seroquel 25mg  qHS after NG tube placed - avoid benzos - PT efforts as able  RENAL: 1. Obstructive uropathy - in setting of urothelial carcinoma. Status post percutaneous nephrostomy tube. 2. 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 Anemia/thrombocytopenia - Trend CBC  - Monitor for bleeding   MUSCULOSKELETAL: 1. Left hip fracture - status post nail by orthopedics.  - Per Ortho  - Mobilize as able > last orders for no weight bearing on left leg   GASTROINTESTINAL: 1. Moderate to severe protein-calorie malnutrition: - not eating due to encephalopathy - will place NG tube and start tube feeds; improving her hypoalbuminemia will help her anasarca and help her to diurese  - D10 at [facility]  - consult nutrition  CARDIOVASCULAR No active issues   ENDOCRINE - hypoglycemic due to NPO; continue D10 gtt for now    60 minutes critical care time  Vernie Murders MD Pulmonary and [place] Pager: [phone]  11/20/2016, 10:17 AM

## 2016-11-20 NOTE — Progress Notes (Signed)
eLink Physician-Brief Progress Note Patient Name: Isabel Collins DOB: [birth year] MRN: [id number]   Date of Service  11/20/2016  HPI/Events of Note  Patient still somewhat agitated. Currently off BiPAP. Appears uncomfortable on camera. Previous EKG from 9/10 reviewed showing normal QTC.   eICU Interventions  Haldol IV when necessary ordered for agitation      Intervention Category Major Interventions: Delirium, psychosis, severe agitation - evaluation and management  Tera Partridge 11/20/2016, 2:06 AM

## 2016-11-20 NOTE — Progress Notes (Signed)
CSW aware of consult to help find patient guardianship and is working towards finding family or starting guardianship request with DSS  No updates at this time  Jorge Ny, [place] Social Worker [phone]

## 2016-11-21 ENCOUNTER — Other Ambulatory Visit: Payer: Self-pay

## 2016-11-21 ENCOUNTER — Inpatient Hospital Stay (HOSPITAL_COMMUNITY): Payer: Medicare Other

## 2016-11-21 LAB — RENAL FUNCTION PANEL
ALBUMIN: 2.5 g/dL — AB (ref 3.5–5.0)
ANION GAP: 12 (ref 5–15)
BUN: 55 mg/dL — ABNORMAL HIGH (ref 6–20)
CALCIUM: 8.6 mg/dL — AB (ref 8.9–10.3)
CO2: 18 mmol/L — AB (ref 22–32)
CREATININE: 3.27 mg/dL — AB (ref 0.44–1.00)
Chloride: 110 mmol/L (ref 101–111)
GFR calc Af Amer: 15 mL/min — ABNORMAL LOW (ref >60)
GFR calc non Af Amer: 13 mL/min — ABNORMAL LOW (ref >60)
Glucose, Bld: 168 mg/dL — ABNORMAL HIGH (ref 65–99)
PHOSPHORUS: 5.4 mg/dL — AB (ref 2.5–4.6)
Potassium: 5.1 mmol/L (ref 3.5–5.1)
SODIUM: 140 mmol/L (ref 135–145)

## 2016-11-21 LAB — GLUCOSE, CAPILLARY
GLUCOSE-CAPILLARY: 173 mg/dL — AB (ref 65–99)
GLUCOSE-CAPILLARY: 159 mg/dL — AB (ref 65–99)
GLUCOSE-CAPILLARY: 164 mg/dL — AB (ref 65–99)
Glucose-Capillary: 145 mg/dL — ABNORMAL HIGH (ref 65–99)
Glucose-Capillary: 122 mg/dL — ABNORMAL HIGH (ref 65–99)
Glucose-Capillary: 133 mg/dL — ABNORMAL HIGH (ref 65–99)

## 2016-11-21 LAB — CBC
HCT: 25.7 % — ABNORMAL LOW (ref 36.0–46.0)
Hemoglobin: 7.7 g/dL — ABNORMAL LOW (ref 12.0–15.0)
MCH: 26.6 pg (ref 26.0–34.0)
MCHC: 30 g/dL (ref 30.0–36.0)
MCV: 88.9 fL (ref 78.0–100.0)
Platelets: 142 10*3/uL — ABNORMAL LOW (ref 150–400)
RBC: 2.89 MIL/uL — ABNORMAL LOW (ref 3.87–5.11)
RDW: 23.2 % — AB (ref 11.5–15.5)
WBC: 18.1 10*3/uL — ABNORMAL HIGH (ref 4.0–10.5)

## 2016-11-21 LAB — PROCALCITONIN: PROCALCITONIN: 7.27 ng/mL

## 2016-11-21 LAB — MAGNESIUM: Magnesium: 2.6 mg/dL — ABNORMAL HIGH (ref 1.7–2.4)

## 2016-11-21 MED ORDER — SODIUM CHLORIDE 0.9% FLUSH: 10.0000 mL | INTRAVENOUS | Status: DC | PRN

## 2016-11-21 MED ORDER — QUETIAPINE FUMARATE 50 MG PO TABS
50.0000 mg | ORAL_TABLET | Freq: Every day | ORAL | Status: DC
  Administered 2016-11-21: 50 mg via NASOGASTRIC
  Filled 2016-11-21: qty 1

## 2016-11-21 MED ORDER — HYDROCORTISONE NA SUCCINATE PF 100 MG IJ SOLR
50.0000 mg | Freq: Two times a day (BID) | INTRAMUSCULAR | Status: AC
  Administered 2016-11-22 – 2016-11-23 (×2): 50 mg via INTRAVENOUS
  Filled 2016-11-21 (×2): qty 2

## 2016-11-21 MED ORDER — HYDROCORTISONE NA SUCCINATE PF 100 MG IJ SOLR
50.0000 mg | Freq: Three times a day (TID) | INTRAMUSCULAR | Status: AC
  Administered 2016-11-21 – 2016-11-22 (×3): 50 mg via INTRAVENOUS
  Filled 2016-11-21 (×3): qty 2

## 2016-11-21 MED ORDER — HYDROCORTISONE NA SUCCINATE PF 100 MG IJ SOLR: 25.0000 mg | INTRAMUSCULAR | Status: DC

## 2016-11-21 MED ORDER — ALBUMIN HUMAN 25 % IV SOLN
12.5000 g | Freq: Once | INTRAVENOUS | Status: DC
  Filled 2016-11-21: qty 50

## 2016-11-21 MED ORDER — HYDROCORTISONE NA SUCCINATE PF 100 MG IJ SOLR
50.0000 mg | INTRAMUSCULAR | Status: AC
  Administered 2016-11-23: 50 mg via INTRAVENOUS
  Filled 2016-11-21: qty 2

## 2016-11-21 MED ORDER — NEPRO/CARBSTEADY PO LIQD
1000.0000 mL | ORAL | Status: DC
  Administered 2016-11-21 – 2016-11-23 (×3): 1000 mL
  Filled 2016-11-21 (×5): qty 1000

## 2016-11-21 NOTE — Progress Notes (Addendum)
   11/21/16 2322 11/21/16 2329 11/21/16 2330  Vitals  BP (!) 81/42 (!) 84/41 (!) 76/28  MAP (mmHg) (!) 54 (!) 52 (!) 41   MD paged. MD sending NP to bedside.

## 2016-11-21 NOTE — Progress Notes (Signed)
SLP Cancellation Note  Patient Details Name: Isabel Collins MRN: [id number] DOB: 11-Jul-1940   Cancelled treatment:        Pt NPO spoke with MD re: pt status. MD uncertain if/when she can return to po status. Issue has been somnolence. If comfort care feeds are decided upon in the future, ST can be of assistance. ST will sign off now, however please reconsult if improves and po's considered or comfort feeds decided upon.   Isabel Collins 11/21/2016, 3:45 PM Isabel Collins.Ed [employer] [phone]

## 2016-11-21 NOTE — Progress Notes (Signed)
CSW continuing to follow for guardianship/finding family or friends to help with decisions:  -found number from chart listed as best contact number for patient early this year- female picked up the phone and CSW explained reason for call- female hung up- CSW attempted to call back and was sent to voicemail after a few rings  - called numbers listed for [facility] services who were working with patient- Shanon Brow with [facility] is looking into case and will call CSW back with any information.   CSW left message to discuss with CSW supervisor  Jorge Ny, LCSW Clinical Social Worker [phone]

## 2016-11-21 NOTE — Progress Notes (Signed)
eLink Physician-Brief Progress Note Patient Name: Isabel Collins DOB: 03/07/1940 MRN: [id number]   Date of Service  11/21/2016  HPI/Events of Note  BP low, nurse says no fluids to be given.   eICU Interventions  Assess need for pressors, NP sent to bedside to evaluate     Intervention Category Evaluation Type: Other  Flora Lipps 11/21/2016, 11:40 PM

## 2016-11-21 NOTE — Progress Notes (Signed)
   11/21/16 2029 11/21/16 2031 11/21/16 2042  Vitals  BP (!) 130/116 (!) 166/151 (!) 189/176  MAP (mmHg) 161 158 182     11/21/16 2052  Vitals  BP (!) 173/148  MAP (mmHg) 158   Elink MD paged. Stat EKG, CXR ordered. MD sending in house MD to see pt. Will continue to monitor.

## 2016-11-21 NOTE — Progress Notes (Signed)
eLink Physician-Brief Progress Note Patient Name: Isabel Collins DOB: [birth year] MRN: [id number]   Date of Service  11/21/2016  HPI/Events of Note  Hypertension - However, BP now = 117/70. Now back on BiPAP. Sat = 100% and RR = 19.  eICU Interventions  Will order: 1. Portable CXR STAT. 2. 12 Lead  EKG STAT. 3. Will ask ground team to evaluate.      Intervention Category Major Interventions: Hypertension - evaluation and management  Damier Disano [place] 11/21/2016, 9:16 PM

## 2016-11-21 NOTE — Progress Notes (Signed)
PULMONARY / CRITICAL CARE MEDICINE   Name: Isabel Collins MRN: [id number] DOB: 1940-08-27    ADMISSION DATE:  10/11/2016   HISTORY OF PRESENT ILLNESS:  76 y.o. 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 Patient underwent right ureteral stent placement on 6/25 and was subsequently scheduled for nephrectomy on 8/30 but had a fall with left femur fracture that was nailed on 9/4. Patient has developed an ESBL Escherichia coli infection with bacteremia. She did require nephrostomy tube placement on the right on 9/8. PCCM was asked to consult on 9/8 with tenuous medical status & multiple medical problems.  SUBJECTIVE:  Continues to do well off BIPAP. Now on 4L O2 down from 6L yesterday. Did not diurese at all with Lasix gtt started yesterday; has had zero UOP so will remove foley catheter. No agitation overnight but continued to be delirious. NG tube placed yesterday, and she has been started on TF's which she is tolerating. Social work is still trying to find family and appoint guardianship.   VITAL SIGNS: BP 105/79   Pulse (!) 115   Temp (!) 97.1 F (36.2 C)   Resp 18   Ht 5\' 2"  (1.575 m)   Wt 90.9 kg (200 lb 6.4 oz)   SpO2 95%   BMI 36.65 kg/m   INTAKE / OUTPUT: I/O last 3 completed shifts: In: 786 [I.V.:376; NG/GT:410] Out: 150 [Urine:150]  PHYSICAL EXAMINATION: General: frail elderly female, on 4L O2, lying in bed    HEENT: MM pink/dry, poor dentition, Neuro: somnolent, awakens to voice. Is not oriented. Not obeying commands. CV: s1s2 rrr, no m/r/g PULM: even/non-labored, lungs bilaterally clear anterior, diminished lateral/lower GI: soft, non-tender, bsx4 active  Extremities: warm/dry, 2-3+ generalized edema / anasarca Skin: no rashes or lesions   LABS:  BMET  Recent Labs Lab 11/19/16 0515 11/20/16 0259 11/21/16 0305  NA 141 140 140  K 4.4 4.6 5.1  CL 110 110 110  CO2 19* 19* 18*  BUN 48* 51* 55*  CREATININE 2.58*  2.89* 3.27*  GLUCOSE 123* 115* 168*    Electrolytes  Recent Labs Lab 11/19/16 0515 11/20/16 0259 11/21/16 0305  CALCIUM 8.3* 8.5* 8.6*  MG 2.4 2.6* 2.6*  PHOS 4.5 4.9* 5.4*    CBC  Recent Labs Lab 11/19/16 0515 11/20/16 1158 11/21/16 0840  WBC 18.5* 19.2* 18.1*  HGB 8.0* 8.1* 7.7*  HCT 26.5* 27.3* 25.7*  PLT 177 178 142*    Coag's No results for input(s): APTT, INR in the last 168 hours.  Sepsis Markers  Recent Labs Lab 11/16/16 0940 11/17/16 0431 11/19/16 1002 11/20/16 0259 11/21/16 0305  LATICACIDVEN 2.9* 2.6*  --  2.9*  --   PROCALCITON  --   --  12.57 9.52 7.27    ABG No results for input(s): PHART, PCO2ART, PO2ART in the last 168 hours.  Liver Enzymes  Recent Labs Lab 11/17/16 0435  11/19/16 0515 11/20/16 0259 11/21/16 0305  AST 94*  --   --   --   --   ALT 9*  --   --   --   --   ALKPHOS 119  --   --   --   --   BILITOT 1.4*  --   --   --   --   ALBUMIN 2.1*  < > 2.4* 2.4* 2.5*  < > = values in this interval not displayed.  Cardiac Enzymes No results for input(s): TROPONINI, PROBNP in the last 168 hours.  Glucose  Recent Labs Lab 11/20/16 1137 11/20/16 1558 11/20/16 2014 11/20/16 2343 11/21/16 0313 11/21/16 0827  GLUCAP 72 86 121* 114* 173* 159*    Imaging Dg Abd 1 View  Result Date: 11/20/2016 CLINICAL DATA:  Feeding tube placement EXAM: ABDOMEN - 1 VIEW COMPARISON:  None available FINDINGS: Spot fluoroscopic intraoperative views demonstrate feeding tube tip proximal duodenum. Contrast injection confirms position. IMPRESSION: Feeding tube proximal duodenum. Electronically Signed   By: Jerilynn Mages.  Shick M.D.   On: 11/20/2016 17:47    IMAGING/STUDIES: PORT CXR 9/10:   Worsening bilateral lung opacification compared with earlier chest x-ray imaging. TTE 9/12:  EF 37-62%, grade 1 diastolic dysfunction, trivial MR, no vegetation  Renal US 9/13 >> large complex mass associated with R kidney with a double-J catheter in place, moderate  hydronephrosis, normal left kidney Renal US 9/17 >>  MICROBIOLOGY: Blood Cultures x2 8/12:  Negative Blood Cultures x2 8/27:  1/2 Positive ESBL E coli  Urine Culture 8/27:  ESBL E coli  MRSA PCR 8/27:  Negative  Blood Cultures x2 8/29:  Negative  Urine Culture 9/5: Negative  Blood Cultures x2 9/7 >>>NEG MRSA PCR 9/8:  Negative  Body Fluid Culture 9/8:  E coli  RPR 9/17 >>   ANTIBIOTICS: Merrem 8/27 >>  SIGNIFICANT EVENTS: 08/27 - Admit 09/08 - Right perc nephrostomy tube placement 09/10 - Transferred to ICU with worsening respiratory status 09/12 - Transitioned back to BiPAP overnight from [place] 5 L/m  ASSESSMENT / PLAN:  76 y.o. female with hx of recent IM nail on L for fracture, urothelial carcinoma admitted with obstructive uropathy s/p percutaneous nephrostomy on the right & subsequent E-Coli bacteremia and acute hypoxic respiratory failure.  Her respiratory status remains tenuous, worsening renal failure (not an HD candidate) and hypotension on neosynephrine / midodrine.   PULMONARY: 1. Acute hypoxic respiratory failure 2. Pleural Effusions - bilateral, volume overload - continue 4L O2 and wean as tolerated - no response to lasix gtt which has now been stopped; patient is completely aneuric. Nephrology doesn't think she is a good candidate for dialysis.   - repeat CXR in AM - Do not feel intubation would be in her best interest given overall state of deconditioning.  Have contacted her emergency contact which is [employer].  Await return call. Social work is trying to find family and starting the process for guardianship - Pulmonary hygiene- IS, mobilize as able  - Duoneb Q6  - stable to transfer to step-down unit at this time  INFECTIOUS: 1. Septic shock (resolved); due to ESBL Ecoli bacteremia and UTI with Obstructive uropathy - shock resolved with vasopressors stopped 9/17 at 3pm - bacteremia has cleared with repeat blood cultures no growth - no  vegetation on echo - continue meropenem (day 20 of 21); appreciate ID rec's - Procalcitonin still elevated but continues to improve - Continue stress steroids (begin to taper) and midodrine 10 mg Q8  NEUROLOGIC: 1. Acute encephalopathy - likely due to delirium with component of metabolic encephalopathy, improving. - no hypercapnea on ABG; LFT's ok; Ammonia wnl.  - increase seroquel from 25mg  to 50mg  qHS  - avoid benzos - PT efforts as able  RENAL: 1. Obstructive uropathy - in setting of urothelial carcinoma. Status post percutaneous nephrostomy tube. 2. 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 Anemia/thrombocytopenia - Trend CBC  - Monitor for bleeding   MUSCULOSKELETAL: 1. Left hip fracture - status post nail by orthopedics.  - Per Ortho  - Mobilize as able > last orders for no weight bearing on left leg   GASTROINTESTINAL: 1. Moderate to severe protein-calorie malnutrition: - not eating due to encephalopathy; NG placed and started on [place]. Asked Nutritionist to change to Nephro today.  - d/c D10 gtt  - appreciate nutrition rec's  CARDIOVASCULAR No active issues   ENDOCRINE - hypoglycemic resolved; continue TF's   35 minutes critical care time  Vernie Murders MD Pulmonary and [place] Pager: [phone]  11/21/2016, 10:35 AM

## 2016-11-21 NOTE — Progress Notes (Addendum)
CRITICAL VALUE ALERT  Critical Value:  Lactic Acid : 2.9  Date & Time Notied:  11/20/16 0315  Provider Notified: Ashok Cordia, MD  Orders Received/Actions taken: none

## 2016-11-22 ENCOUNTER — Inpatient Hospital Stay (HOSPITAL_COMMUNITY): Payer: Medicare Other

## 2016-11-22 DIAGNOSIS — D696 Thrombocytopenia, unspecified: Secondary | ICD-10-CM

## 2016-11-22 DIAGNOSIS — I5032 Chronic diastolic (congestive) heart failure: Secondary | ICD-10-CM

## 2016-11-22 DIAGNOSIS — R6521 Severe sepsis with septic shock: Secondary | ICD-10-CM

## 2016-11-22 DIAGNOSIS — N139 Obstructive and reflux uropathy, unspecified: Secondary | ICD-10-CM

## 2016-11-22 DIAGNOSIS — E43 Unspecified severe protein-calorie malnutrition: Secondary | ICD-10-CM

## 2016-11-22 LAB — BLOOD GAS, ARTERIAL
Acid-base deficit: 6.2 mmol/L — ABNORMAL HIGH (ref 0.0–2.0)
BICARBONATE: 17.3 mmol/L — AB (ref 20.0–28.0)
Delivery systems: POSITIVE
Drawn by: 213381
EXPIRATORY PAP: 5
FIO2: 50
INSPIRATORY PAP: 10
O2 SAT: 93.3 %
PATIENT TEMPERATURE: 98.3
PCO2 ART: 26.5 mmHg — AB (ref 32.0–48.0)
PH ART: 7.431 (ref 7.350–7.450)
PO2 ART: 70.1 mmHg — AB (ref 83.0–108.0)
RATE: 12 resp/min

## 2016-11-22 LAB — CBC WITH DIFFERENTIAL/PLATELET
BASOS PCT: 0 %
Basophils Absolute: 0 10*3/uL (ref 0.0–0.1)
EOS PCT: 0 %
Eosinophils Absolute: 0 10*3/uL (ref 0.0–0.7)
HEMATOCRIT: 23.2 % — AB (ref 36.0–46.0)
Hemoglobin: 7.1 g/dL — ABNORMAL LOW (ref 12.0–15.0)
LYMPHS ABS: 0.8 10*3/uL (ref 0.7–4.0)
Lymphocytes Relative: 4 %
MCH: 27.2 pg (ref 26.0–34.0)
MCHC: 30.6 g/dL (ref 30.0–36.0)
MCV: 88.9 fL (ref 78.0–100.0)
MONOS PCT: 7 %
Monocytes Absolute: 1.4 10*3/uL — ABNORMAL HIGH (ref 0.1–1.0)
Neutro Abs: 17.6 10*3/uL — ABNORMAL HIGH (ref 1.7–7.7)
Neutrophils Relative %: 89 %
Platelets: 128 10*3/uL — ABNORMAL LOW (ref 150–400)
RBC: 2.61 MIL/uL — AB (ref 3.87–5.11)
RDW: 23.8 % — ABNORMAL HIGH (ref 11.5–15.5)
WBC: 19.8 10*3/uL — AB (ref 4.0–10.5)

## 2016-11-22 LAB — MAGNESIUM: Magnesium: 2.5 mg/dL — ABNORMAL HIGH (ref 1.7–2.4)

## 2016-11-22 LAB — GLUCOSE, CAPILLARY
GLUCOSE-CAPILLARY: 122 mg/dL — AB (ref 65–99)
GLUCOSE-CAPILLARY: 150 mg/dL — AB (ref 65–99)
Glucose-Capillary: 165 mg/dL — ABNORMAL HIGH (ref 65–99)
Glucose-Capillary: 160 mg/dL — ABNORMAL HIGH (ref 65–99)
Glucose-Capillary: 155 mg/dL — ABNORMAL HIGH (ref 65–99)
Glucose-Capillary: 155 mg/dL — ABNORMAL HIGH (ref 65–99)

## 2016-11-22 LAB — LACTIC ACID, PLASMA
Lactic Acid, Venous: 4 mmol/L (ref 0.5–1.9)
Lactic Acid, Venous: 4.4 mmol/L (ref 0.5–1.9)

## 2016-11-22 LAB — RENAL FUNCTION PANEL
Albumin: 2.8 g/dL — ABNORMAL LOW (ref 3.5–5.0)
Anion gap: 13 (ref 5–15)
BUN: 65 mg/dL — ABNORMAL HIGH (ref 6–20)
CALCIUM: 8.7 mg/dL — AB (ref 8.9–10.3)
CO2: 20 mmol/L — AB (ref 22–32)
CREATININE: 3.39 mg/dL — AB (ref 0.44–1.00)
Chloride: 108 mmol/L (ref 101–111)
GFR calc Af Amer: 14 mL/min — ABNORMAL LOW (ref >60)
GFR calc non Af Amer: 12 mL/min — ABNORMAL LOW (ref >60)
GLUCOSE: 193 mg/dL — AB (ref 65–99)
Phosphorus: 4.8 mg/dL — ABNORMAL HIGH (ref 2.5–4.6)
Potassium: 5 mmol/L (ref 3.5–5.1)
SODIUM: 141 mmol/L (ref 135–145)

## 2016-11-22 LAB — AMMONIA: Ammonia: 40 umol/L — ABNORMAL HIGH (ref 9–35)

## 2016-11-22 MED ORDER — SODIUM CHLORIDE 0.9 % IV SOLN
Freq: Once | INTRAVENOUS | Status: AC
  Administered 2016-11-22: 22:00:00 via INTRAVENOUS

## 2016-11-22 MED ORDER — ALBUMIN HUMAN 5 % IV SOLN
INTRAVENOUS | Status: AC
  Filled 2016-11-22: qty 250

## 2016-11-22 MED ORDER — ALBUMIN HUMAN 5 % IV SOLN
12.5000 g | Freq: Once | INTRAVENOUS | Status: AC
  Administered 2016-11-22: 12.5 g via INTRAVENOUS

## 2016-11-22 MED ORDER — ALBUMIN HUMAN 25 % IV SOLN
50.0000 g | Freq: Once | INTRAVENOUS | Status: AC
  Administered 2016-11-22: 50 g via INTRAVENOUS
  Filled 2016-11-22: qty 200

## 2016-11-22 MED ORDER — MIDODRINE HCL 5 MG PO TABS
10.0000 mg | ORAL_TABLET | Freq: Three times a day (TID) | ORAL | Status: DC
  Administered 2016-11-22 (×2): 10 mg via ORAL
  Filled 2016-11-22 (×2): qty 2

## 2016-11-22 NOTE — Progress Notes (Signed)
Called to patient bedside for hypotension. At 2116 BP was 117/70. Patient was given PRN dose of Fentanyl at 2317. At 2322 BP 81/42.   At bedside patient remains on BiPAP and is lethargic. Arouses to physical stimulation. Fentanyl PRN D/C.  Currently BP 96/50. Will reassess in one hour to see if BP continues to improve or if further interventions are needed.   Hayden Pedro, AGACNP-BC [place] [facility]monary [facility]  Pgr: [phone]  PCCM Pgr: [phone]

## 2016-11-22 NOTE — Progress Notes (Signed)
PROGRESS NOTE    Isabel Collins  YIF:[id number] DOB: 1941/02/11 DOA: 10/24/2016 PCP: [name], MD   Brief Narrative:  76 y.o. 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  Patient tells me that yesterday she was assisted to and from the bedside commode, and fell to the ground.  She denies any syncope or passing out episode.  She has been complaining of persistent left knee pain and decided to come to the emergency room today.  She has a history of left knee replacement on about 10 years ago outside [place].  She was recently hospitalized and discharged about 13 days ago, and at that time she was diagnosed with an ESBL UTI and on discharge she had a PICC line and was on meropenem for a few more days.  Meropenem appears to have been finished and she is now on nitrofurantoin.  She still has the PICC line in place.  Patient denies any chest pain, denies any palpitations, she has no abdominal pain, denies any nausea or vomiting.  She denies any dysuria.  She denies cough / sputum production / congestion.  She appears a bit sleepy in the ED. patient also states that she has been eating and drinking much because of the pain.   ED Course: In the emergency room she is afebrile, her blood pressure was initially in the 86V systolic however improved into the 100s with fluids, she is satting well on 2 L nasal cannula, her blood work reveals acute kidney injury with a creatinine of 2.1 from prior normal values, and a leukocytosis of 19.  Left knee x-ray showed an acute displaced distal femur fracture on the superior aspect of femur hardware without dislocation.  ED physician discussed with orthopedic surgery and recommended surgical repair.  Will  transfer patient to [facility] for OR time over there.    Subjective: 9/20 A/O 0, patient does not respond painful stimuli, will occasionally partially open her eyes and moan.   Assessment & Plan:   Principal Problem:   Acute respiratory distress Active Problems:   Hypertension   Physical deconditioning   Hypothyroidism   Chronic obstructive pulmonary disease (HCC)   Type 2 diabetes mellitus with complication, without long-term current use of insulin (HCC)   Urothelial carcinoma of kidney, right (HCC)   Gastroesophageal reflux disease   History of recent fall   Right renal mass   Fracture of knee prosthesis (HCC)   AKI (acute kidney injury) ([place])   ESBL (extended spectrum beta-lactamase) producing bacteria infection   Closed comminuted supracondylar fracture of left femur (HCC)   Renal failure   Fall   Sepsis due to Escherichia coli (E. coli) (HCC)   Gram-negative bacteremia   Sepsis secondary to UTI ([place])   Transitional cell carcinoma (HCC)   Hypervolemia   HCAP (healthcare-associated pneumonia)   Leukocytosis   Encephalopathy acute   Hydronephrosis of right kidney   Abnormal CT scan, pelvis: 11/09/2016   Closed fracture of left distal femur (HCC)   Pleural effusion on right   Hypoalbuminemia   Acute renal failure (ARF) (HCC)   Acute respiratory failure with hypoxia (HCC)   Acute respiratory failure with hypoxia -Currently on BiPAP. Per nursing staff overnight patient  went into respiratory distress. Decrease SPO2 increased BP -PCXR pending -ABG pending -Concur with [facility] M that intubation would not be in patient's best interest considering her multisystem organ failure.  Bilateral pleural effusions -Titrate O2 to maintain SPO2> 93% -BiPAP PRN  Septic shock/positive ESBL bacteremia & UTI -9/20 patient became hypotensive overnight with respiratory distress. -Previous lactic acid/procalcitonin elevated. Lactic acid on 9/18= 2.9 -Lactic acid  pending -Bacteremia cleared: See blood culture 9/7 -Negative vegetation on echocardiogram -Complete current antibiotics per ID recommendation -9/20 with new onset hypotension   Obstructive uropathy  Chronic Diastolic CHF  -strict in and out since admission +17.3 L -Daily weight  Filed Weights   11/20/16 0431 11/21/16 0300 11/22/16 0330  Weight: 202 lb (91.6 kg) 200 lb 6.4 oz (90.9 kg) 198 lb 10.2 oz (90.1 kg)  -Transfuse for hemoglobin<8 -9/20 Transfuse 1 unit PRBC  Hypotension -Hold all narcotics and benzodiazepine -Continue stress dose steroid taper. May have to discontinue taper. -Midodrin 10 mg TID -9/20 Albumin 50 g 1 -MAP goal> 65  Acute Encephalopathy  -Multifactorial septic shock, CHF, pleural effusions, delirium? 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 -Urology believe patient may be well past curative stage and recommend palliative care/hospice consultation  Acute renal failure/Anuric -Prior to this admission patient's creatinine normal. Renal failure secondary to metastatic right kidney transitional cell carcinoma/obstructive uropathy. Left kidney normal. -9/8 S/P percutaneous nephrostomy tube placed  Recent Labs Lab 11/17/16 0435 11/18/16 0355 11/19/16 0515 11/20/16 0259 11/21/16 0305 11/22/16 0500  CREATININE 1.78* 2.19*  2.21* 2.58* 2.89* 3.27* 3.39*  -Follow patient clearly fluid overloaded (anasarca) patient's BP will not tolerate diuresis at this time.  -Urine output dropped to 0, bladder scan pending. Urinary retention? -Avoid nephrotoxic agents    Anemia -See CHF -Occult blood pending -  Thrombocytopenia -Most likely secondary to sepsis (DIC), metastatic cancer -Monitor closely   LEFT Hip fracture  - Per  Ortho  - Mobilize as able > last orders for no weight bearing on left leg   Left foot Thromboemboli -Patient with left foot cold to touch, cyanotic toes which would be consistent with thromboemboli.  -Will hold on starting Heparin (which is the only anticoagulant patient continued to be administered given her renal failure), secondary to the additional fluid load.  Moderate to severe protein calorie malnutrition -When patient off BiPAP will start tube feeds    Goals of care -Per [facility] M note 9/19 Social work is still trying to find family and appoint guardianship -9/20 PALLIATIVE CARE: Patient with multisystem organ failure, metastatic right kidney transitional cell carcinoma. Believe aggressive care would be futile. See patient for status change to DNR, short-term vs long-term goals of care    DVT prophylaxis: Subcutaneous heparin Code Status: Full Family Communication: None Disposition Plan: TBD   Consultants:  Orthopedics Urology [facility] M ID    Procedures/Significant Events:  08/27 - Admit 9/4 LEFT RETROGRADE FEMORAL NAILING 09/08 - Right perc nephrostomy tube placement 9/10 PCXR:   Worsening bilateral lung opacification compared with earlier chest x-ray imaging. 09/10 - Transferred to ICU with worsening respiratory status 9/12 Echocardiogram: LVEF=  65-70%, -Grade 1 diastolic dysfunction, trivial MR, no vegetation  09/12 - Transitioned back to BiPAP overnight from [place] 5 L/m 9/13 Renal US: >> large complex mass associated with RIGHT kidney with a double-J catheter  in place, moderate hydronephrosis, normal left kidney 9/17 Renal US  >> 9/20 Transfuse 1 unit PRBC       I have personally reviewed and interpreted all radiology studies and my findings are as above.  VENTILATOR SETTINGS: BiPAP   Cultures Blood Cultures x2 8/12:  Negative Blood Cultures x2 8/27:  1/2 Positive ESBL E coli  Urine Culture 8/27:  ESBL E coli  MRSA PCR 8/27:  Negative  Blood Cultures x2  8/29:  Negative  Urine Culture 9/5: Negative  Blood Cultures x2 9/7 >>>NEG MRSA PCR 9/8:  Negative  Body Fluid Culture 9/8:  E coli  RPR 9/17 >>    Antimicrobials: Anti-infectives    Start     Dose/Rate Stop   11/19/16 1700  meropenem (MERREM) 500 mg in sodium chloride 0.9 % 50 mL IVPB     500 mg 100 mL/hr over 30 Minutes 11/21/16 1752   11/07/16 0900  ceFAZolin (ANCEF) IVPB 2g/100 mL premix  Status:  Discontinued     2 g 200 mL/hr over 30 Minutes 11/07/16 0934   11/02/16 0700  ceFAZolin (ANCEF) IVPB 2g/100 mL premix     2 g 200 mL/hr over 30 Minutes 11/03/16 0700   11/02/16 0700  gentamicin (GARAMYCIN) 280 mg in dextrose 5 % 100 mL IVPB  Status:  Discontinued     5 mg/kg  56.2 kg (Adjusted) 107 mL/hr over 60 Minutes 11/02/16 1006   10/30/16 1000  fluconazole (DIFLUCAN) tablet 100 mg  Status:  Discontinued     100 mg 11/12/16 0926   10/30/16 0600  ceFAZolin (ANCEF) IVPB 2g/100 mL premix  Status:  Discontinued     2 g 200 mL/hr over 30 Minutes 10/30/16 1212   10/30/16 0500  meropenem (MERREM) 1 g in sodium chloride 0.9 % 100 mL IVPB  Status:  Discontinued     1 g 200 mL/hr over 30 Minutes 11/19/16 0926   10/15/2016 1630  meropenem (MERREM) 1 g in sodium chloride 0.9 % 100 mL IVPB     1 g 200 mL/hr over 30 Minutes 10/17/2016 1919   11/01/2016 1545  vancomycin (VANCOCIN) IVPB 1000 mg/200 mL premix     1,000 mg 200 mL/hr over 60 Minutes 10/23/2016 1727   10/09/2016 1545  piperacillin-tazobactam (ZOSYN) IVPB 3.375 g  Status:  Discontinued     3.375 g 100 mL/hr over 30 Minutes 10/08/2016 1546       Devices    LINES / TUBES:      Continuous Infusions:   Objective: Vitals:   11/22/16 0500 11/22/16 0530 11/22/16 0600 11/22/16 0630  BP: (!) 100/53 (!) 109/95 110/61 111/64  Pulse: (!) 59  (!) 36 (!) 25  Resp: 20 (!) 21 17 (!) 22  Temp:      TempSrc:      SpO2: (!) 68%  (!) 75% (!) 70%  Weight:      Height:        Intake/Output Summary (Last 24 hours) at 11/22/16  0739 Last data filed at 11/22/16 0532  Gross per 24 hour  Intake              981 ml  Output                1 ml  Net              980 ml   Filed Weights   11/20/16 0431 11/21/16 0300 11/22/16 0330  Weight: 202 lb (91.6 kg) 200 lb 6.4  oz (90.9 kg) 198 lb 10.2 oz (90.1 kg)    Examination:  General: A/O 0, positive acute respiratory distress Eyes: partially opens eyes,  Lungs: Clear to auscultation bilaterally without wheezes or crackles Cardiovascular: Tachycardic, Regular rhythm without murmur gallop or rub normal S1 and S2 Abdomen: Obese, negative abdominal pain, nondistended, positive soft, bowel sounds, no rebound, no ascites, no appreciable mass, anasarca Extremities: positive left foot cyanosis, clubbing, left lower foot cold touch, all toes bluish, sluggish capillary refill, DP/PT pulse one plus.  Psychiatric:  Unable to assess secondary to altered mental status  Central nervous system:  Unable to assess secondary to altered mental status  .     Data Reviewed: Care during the described time interval was provided by me .  I have reviewed this patient's available data, including medical history, events of note, physical examination, and all test results as part of my evaluation.   CBC:  Recent Labs Lab 11/16/16 0437 11/17/16 0435 11/18/16 0355 11/19/16 0515 11/20/16 1158 11/21/16 0840  WBC 18.4* 22.5* 20.1* 18.5* 19.2* 18.1*  NEUTROABS 15.0* 17.8*  --   --  17.4*  --   HGB 7.2* 8.4* 8.2* 8.0* 8.1* 7.7*  HCT 23.2* 28.2* 27.5* 26.5* 27.3* 25.7*  MCV 86.9 86.8 87.3 87.2 88.3 88.9  PLT 169 216 194 177 178 778*   Basic Metabolic Panel:  Recent Labs Lab 11/18/16 0355 11/19/16 0515 11/20/16 0259 11/21/16 0305 11/22/16 0500  NA 140  139 141 140 140 141  K 4.4  4.4 4.4 4.6 5.1 5.0  CL 110  108 110 110 110 108  CO2 19*  20* 19* 19* 18* 20*  GLUCOSE 146*  142* 123* 115* 168* 193*  BUN 43*  44* 48* 51* 55* 65*  CREATININE 2.19*  2.21* 2.58* 2.89* 3.27*  3.39*  CALCIUM 8.1*  8.0* 8.3* 8.5* 8.6* 8.7*  MG 2.3 2.4 2.6* 2.6* 2.5*  PHOS 4.8* 4.5 4.9* 5.4* 4.8*   GFR: Estimated Creatinine Clearance: 14.5 mL/min (A) (by C-G formula based on SCr of 3.39 mg/dL (H)). Liver Function Tests:  Recent Labs Lab 11/17/16 0435 11/18/16 0355 11/19/16 0515 11/20/16 0259 11/21/16 0305 11/22/16 0500  AST 94*  --   --   --   --   --   ALT 9*  --   --   --   --   --   ALKPHOS 119  --   --   --   --   --   BILITOT 1.4*  --   --   --   --   --   PROT 4.9*  --   --   --   --   --   ALBUMIN 2.1* 2.2* 2.4* 2.4* 2.5* 2.8*   No results for input(s): LIPASE, AMYLASE in the last 168 hours.  Recent Labs Lab 11/19/16 1030 11/22/16 0500  AMMONIA 23 40*   Coagulation Profile: No results for input(s): INR, PROTIME in the last 168 hours. Cardiac Enzymes: No results for input(s): CKTOTAL, CKMB, CKMBINDEX, TROPONINI in the last 168 hours. BNP (last 3 results) No results for input(s): PROBNP in the last 8760 hours. HbA1C: No results for input(s): HGBA1C in the last 72 hours. CBG:  Recent Labs Lab 11/21/16 1219 11/21/16 1608 11/21/16 2027 11/21/16 2320 11/22/16 0427  GLUCAP 145* 122* 164* 133* 165*   Lipid Profile: No results for input(s): CHOL, HDL, LDLCALC, TRIG, CHOLHDL, LDLDIRECT in the last 72 hours. Thyroid Function Tests: No results for input(s): TSH, T4TOTAL,  FREET4, T3FREE, THYROIDAB in the last 72 hours. Anemia Panel: No results for input(s): VITAMINB12, FOLATE, FERRITIN, TIBC, IRON, RETICCTPCT in the last 72 hours. Urine analysis:    Component Value Date/Time   COLORURINE YELLOW 11/10/2016 0941   APPEARANCEUR CLOUDY (A) 11/10/2016 0941   LABSPEC 1.008 11/10/2016 0941   PHURINE 5.0 11/10/2016 0941   GLUCOSEU NEGATIVE 11/10/2016 0941   HGBUR LARGE (A) 11/10/2016 0941   BILIRUBINUR NEGATIVE 11/10/2016 0941   BILIRUBINUR small (A) 07/26/2016 1432   KETONESUR NEGATIVE 11/10/2016 0941   PROTEINUR 30 (A) 11/10/2016 0941   UROBILINOGEN  0.2 07/26/2016 1432   NITRITE NEGATIVE 11/10/2016 0941   LEUKOCYTESUR LARGE (A) 11/10/2016 0941   Sepsis Labs: _0 (procalcitonin:4,lacticidven:4)  )No results found for this or any previous visit (from the past 240 hour(s)).       Radiology Studies: Dg Abd 1 View  Result Date: 11/20/2016 CLINICAL DATA:  Feeding tube placement EXAM: ABDOMEN - 1 VIEW COMPARISON:  None available FINDINGS: Spot fluoroscopic intraoperative views demonstrate feeding tube tip proximal duodenum. Contrast injection confirms position. IMPRESSION: Feeding tube proximal duodenum. Electronically Signed   By: Jerilynn Mages.  Shick M.D.   On: 11/20/2016 17:47   Dg Chest Port 1 View  Result Date: 11/21/2016 CLINICAL DATA:  Hypoxia. EXAM: PORTABLE CHEST 1 VIEW COMPARISON:  11/20/2016 FINDINGS: Shallow inspiration. Probable bilateral pleural effusions with basilar atelectasis or consolidation bilaterally. Heart size is obscured by the pleural/parenchymal process. No definite vascular congestion. No pneumothorax. Calcified granuloma in the right mid lung. Since the previous study, an enteric tube has been placed. The tip is off the field of view but below the left hemidiaphragm. Left PICC catheter remains in place with tip over the cavoatrial junction region. Postoperative changes in the right shoulder. Degenerative changes in the spine and left shoulder. IMPRESSION: Appliances appear in satisfactory position. Persistent bilateral pleural effusions and basilar atelectasis or consolidation without change. Electronically Signed   By: Lucienne Capers M.D.   On: 11/21/2016 21:54        Scheduled Meds: . atorvastatin  40 mg Oral QHS  . budesonide (PULMICORT) nebulizer solution  0.5 mg Nebulization BID  . chlorhexidine gluconate (MEDLINE KIT)  15 mL Mouth Rinse BID  . cycloSPORINE  1 drop Both Eyes BID  . feeding supplement (NEPRO CARB STEADY)  1,000 mL Per Tube Q24H  . heparin subcutaneous  5,000 Units Subcutaneous Q8H  .  hydrocortisone sod succinate (SOLU-CORTEF) inj  50 mg Intravenous Q8H   Followed by  . hydrocortisone sod succinate (SOLU-CORTEF) inj  50 mg Intravenous Q12H   Followed by  . [START ON 11/23/2016] hydrocortisone sod succinate (SOLU-CORTEF) inj  50 mg Intravenous Q24H   Followed by  . [START ON 11/24/2016] hydrocortisone sod succinate (SOLU-CORTEF) inj  25 mg Intravenous Q24H  . ipratropium-albuterol  3 mL Nebulization TID  . lidocaine  1 patch Transdermal Daily  . mouth rinse  15 mL Mouth Rinse QID  . midodrine  5 mg Oral Q8H  . QUEtiapine  50 mg Per NG tube QHS  . sodium chloride flush  10-40 mL Intracatheter Q12H   Continuous Infusions:   LOS: 24 days    Time spent: 40 minutes    WOODS, Geraldo Docker, MD [facility] Pager [phone]   If 7PM-7AM, please contact night-coverage [email or web address] Password [facility] 11/22/2016, 7:39 AM

## 2016-11-22 NOTE — Progress Notes (Addendum)
3pm  received call back from Isabel Collins [phone] who confirms she is patient's granddaughter but lives in [place].  Isabel Collins does no know of anyone else who has been in contact with the patient and did not have additional family contacts for patient- states the last time she spoke with her was last year when she tired to convince the patient to move back to [place]- has not had contact with her since then.  CSW explained that patient is critically ill and currently not oriented enough to make decisions- Isabel Collins states she is willing to speak with physicians and help with medical decisions at this time.  CSW updated MD and provided with contact info in order to call and update Isabel Collins on pt status  10:30am CSW was able to speak with patient's ex-husband- Isabel Collins- in regards to patient.  They have been separated since the 80's and he has not had much contact with her since.  Isabel Collins does NOT wish to be involved in decision making for the patient but willing to give [place] information on other family members.  He reports that they have one son together- Isabel Collins- who he believes is in prison near [place], [place] searching to see if patient son can be reached  Isabel Collins also reports that they have two grandchildren- Isabel Collins and Isabel Collins- provided phone # for [place] left message and waiting for return call  Isabel Collins reports that patient has several brothers and sisters but can't remember any of their real names- knew one of her brothers at [employer]"- believes that patient's maiden name is Marzetta Board- CSW to expand search to see if family can be found using maiden name  CSW will continue to follow  Isabel Collins, [place] Social Worker [phone]

## 2016-11-23 DIAGNOSIS — E872 Acidosis: Secondary | ICD-10-CM

## 2016-11-23 LAB — RENAL FUNCTION PANEL
ALBUMIN: 3.2 g/dL — AB (ref 3.5–5.0)
ANION GAP: 11 (ref 5–15)
BUN: 72 mg/dL — ABNORMAL HIGH (ref 6–20)
CALCIUM: 8.7 mg/dL — AB (ref 8.9–10.3)
CO2: 19 mmol/L — ABNORMAL LOW (ref 22–32)
Chloride: 111 mmol/L (ref 101–111)
Creatinine, Ser: 3.62 mg/dL — ABNORMAL HIGH (ref 0.44–1.00)
GFR, EST AFRICAN AMERICAN: 13 mL/min — AB (ref >60)
GFR, EST NON AFRICAN AMERICAN: 11 mL/min — AB (ref >60)
Glucose, Bld: 166 mg/dL — ABNORMAL HIGH (ref 65–99)
PHOSPHORUS: 4.7 mg/dL — AB (ref 2.5–4.6)
Potassium: 4.7 mmol/L (ref 3.5–5.1)
SODIUM: 141 mmol/L (ref 135–145)

## 2016-11-23 LAB — GLUCOSE, CAPILLARY
GLUCOSE-CAPILLARY: 151 mg/dL — AB (ref 65–99)
Glucose-Capillary: 172 mg/dL — ABNORMAL HIGH (ref 65–99)
Glucose-Capillary: 166 mg/dL — ABNORMAL HIGH (ref 65–99)
Glucose-Capillary: 180 mg/dL — ABNORMAL HIGH (ref 65–99)
Glucose-Capillary: 141 mg/dL — ABNORMAL HIGH (ref 65–99)

## 2016-11-23 LAB — CBC
HEMATOCRIT: 31.8 % — AB (ref 36.0–46.0)
HEMOGLOBIN: 9.7 g/dL — AB (ref 12.0–15.0)
MCH: 27.7 pg (ref 26.0–34.0)
MCHC: 30.5 g/dL (ref 30.0–36.0)
MCV: 90.9 fL (ref 78.0–100.0)
Platelets: 144 10*3/uL — ABNORMAL LOW (ref 150–400)
RBC: 3.5 MIL/uL — AB (ref 3.87–5.11)
RDW: 21.9 % — ABNORMAL HIGH (ref 11.5–15.5)
WBC: 24 10*3/uL — ABNORMAL HIGH (ref 4.0–10.5)

## 2016-11-23 LAB — BLOOD GAS, ARTERIAL
ACID-BASE DEFICIT: 6.7 mmol/L — AB (ref 0.0–2.0)
Bicarbonate: 17.3 mmol/L — ABNORMAL LOW (ref 20.0–28.0)
DELIVERY SYSTEMS: POSITIVE
Drawn by: 511841
Expiratory PAP: 5
FIO2: 50
INSPIRATORY PAP: 10
LHR: 12 {breaths}/min
O2 SAT: 93.8 %
PCO2 ART: 29.3 mmHg — AB (ref 32.0–48.0)
PH ART: 7.389 (ref 7.350–7.450)
Patient temperature: 98.7
pO2, Arterial: 71.9 mmHg — ABNORMAL LOW (ref 83.0–108.0)

## 2016-11-23 LAB — PREPARE RBC (CROSSMATCH)

## 2016-11-23 LAB — LACTIC ACID, PLASMA
LACTIC ACID, VENOUS: 4.1 mmol/L — AB (ref 0.5–1.9)
Lactic Acid, Venous: 4.4 mmol/L (ref 0.5–1.9)

## 2016-11-23 LAB — MAGNESIUM: Magnesium: 2.6 mg/dL — ABNORMAL HIGH (ref 1.7–2.4)

## 2016-11-23 MED ORDER — SODIUM CHLORIDE 0.9 % IV SOLN
500.0000 mg | Freq: Two times a day (BID) | INTRAVENOUS | Status: DC
  Administered 2016-11-23 – 2016-11-24 (×2): 500 mg via INTRAVENOUS
  Filled 2016-11-23 (×3): qty 0.5

## 2016-11-23 MED ORDER — HYDROCORTISONE NA SUCCINATE PF 100 MG IJ SOLR
100.0000 mg | Freq: Three times a day (TID) | INTRAMUSCULAR | Status: DC
  Administered 2016-11-23 – 2016-11-24 (×3): 100 mg via INTRAVENOUS
  Filled 2016-11-23 (×3): qty 2

## 2016-11-23 MED ORDER — SODIUM CHLORIDE 0.45 % IV BOLUS
500.0000 mL | Freq: Once | INTRAVENOUS | Status: AC
  Administered 2016-11-23: 500 mL via INTRAVENOUS

## 2016-11-23 NOTE — Progress Notes (Signed)
PROGRESS NOTE    Isabel Collins  TGG:[id number] DOB: [birth year] DOA: 10/19/2016 PCP: [name], MD   Brief Narrative:  76 y.o. 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  Patient tells me that yesterday she was assisted to and from the bedside commode, and fell to the ground.  She denies any syncope or passing out episode.  She has been complaining of persistent left knee pain and decided to come to the emergency room today.  She has a history of left knee replacement on about 10 years ago outside [place].  She was recently hospitalized and discharged about 13 days ago, and at that time she was diagnosed with an ESBL UTI and on discharge she had a PICC line and was on meropenem for a few more days.  Meropenem appears to have been finished and she is now on nitrofurantoin.  She still has the PICC line in place.  Patient denies any chest pain, denies any palpitations, she has no abdominal pain, denies any nausea or vomiting.  She denies any dysuria.  She denies cough / sputum production / congestion.  She appears a bit sleepy in the ED. patient also states that she has been eating and drinking much because of the pain.   ED Course: In the emergency room she is afebrile, her blood pressure was initially in the 70J systolic however improved into the 100s with fluids, she is satting well on 2 L nasal cannula, her blood work reveals acute kidney injury with a creatinine of 2.1 from prior normal values, and a leukocytosis of 19.  Left knee x-ray showed an acute displaced distal femur fracture on the superior aspect of femur hardware without dislocation.  ED physician discussed with orthopedic surgery and recommended surgical repair.  Will  transfer patient to [facility] for OR time over there.    Subjective: 9/21 A/O 0 unresponsive to painful stimuli.   Assessment & Plan:   Principal Problem:   Acute respiratory distress Active Problems:   Hypertension   Physical deconditioning   Hypothyroidism   Chronic obstructive pulmonary disease (HCC)   Type 2 diabetes mellitus with complication, without long-term current use of insulin (HCC)   Urothelial carcinoma of kidney, right (HCC)   Gastroesophageal reflux disease   History of recent fall   Right renal mass   Fracture of knee prosthesis (HCC)   AKI (acute kidney injury) ([place])   ESBL (extended spectrum beta-lactamase) producing bacteria infection   Closed comminuted supracondylar fracture of left femur (HCC)   Renal failure   Fall   Sepsis due to Escherichia coli (E. coli) (HCC)   Gram-negative bacteremia   Sepsis secondary to UTI ([place])   Transitional cell carcinoma (HCC)   Hypervolemia   HCAP (healthcare-associated pneumonia)   Leukocytosis   Encephalopathy acute   Hydronephrosis of right kidney   Abnormal CT scan, pelvis: 11/09/2016   Closed fracture of left distal femur (HCC)   Pleural effusion on right   Hypoalbuminemia   Acute renal failure (ARF) (HCC)   Acute respiratory failure with hypoxia (HCC)   Acute respiratory failure with hypoxia -Continues on BiPAP. Attempt to wean, SPO2 goal> 93% -9/20 PCXR Consistent with fluid overload, secondary to CHF.  Possible pneumonia -ABG: Compensated respiratory alkalosis  -Concur with [facility] M that intubation would not be in patient's best interest considering her multisystem organ failure.  Bilateral pleural effusions -Titrate O2 to maintain SPO2> 93% -BiPAP PRN  Lactic acidosis/Leukocytosis -Unknown cause -Patient has completed course of multiple antibiotics now with increasing leukocytosis and lactic acid -Panculture. However may be secondary to patient's metastatic RIGHT kidney transitional cell  cancer.  Septic shock/positive ESBL bacteremia & UTI -Bacteremia?, Intra-Abdominal infection? -Patient continues to be non-responsive. -Bacteremia had cleared: See blood culture 9/7.-Negative vegetation per echocardiogram -Antibiotic regimen completed per ID recommendation -Lactic acidosis trending up.  Recent Labs Lab 11/17/16 0431 11/20/16 0259 11/22/16 1530 11/22/16 2028 11/23/16 1240 11/23/16 1521  LATICACIDVEN 2.6* 2.9* 4.0* 4.4* 4.1* 4.4*  -After labs obtain restart Meropenem -0.45% saline 500 ml bolus; patient requires much greater fluid resuscitation however currently anuric. If patient starts to make some urine will slowly resuscitate with albumin and  Normal saline or 0.45% saline.  -Patient's prognosis is extremely poor  Obstructive uropathy -S/P right nephrostomy tube placement  Chronic Diastolic CHF  -strict in and out since admission +17.3 L -Daily weight  Filed Weights   11/21/16 0300 11/22/16 0330 11/23/16 0400  Weight: 200 lb 6.4 oz (90.9 kg) 198 lb 10.2 oz (90.1 kg) 202 lb 6.1 oz (91.8 kg)  -Transfuse for hemoglobin <8 -9/20 Transfuse 1 unit PRBC  Hypotension -Hold all narcotics and benzodiazepine -Increase Solu-Cortef 100 mg TID  -Midodrin 10 mg TID -MAP goal> 65  Acute Encephalopathy  -Multifactorial septic shock, CHF, pleural effusions, delirium? 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 -Urology believe patient may be well past curative stage and recommend palliative care/hospice consultation  Acute renal failure/Anuric -Prior to this admission patient's creatinine normal. Renal failure secondary to metastatic right kidney transitional cell carcinoma/obstructive uropathy. Left kidney normal. -9/8 S/P percutaneous nephrostomy tube  placed   Recent Labs Lab 11/18/16 0355 11/19/16 0515 11/20/16 0259 11/21/16 0305 11/22/16 0500 11/23/16 0650  CREATININE 2.19*  2.21* 2.58* 2.89* 3.27* 3.39* 3.62*  -Follow patient clearly fluid overloaded (anasarca) patient's BP will not tolerate diuresis at this time.  -Urine output dropped to 0, bladder scan pending. Urinary retention? -Avoid nephrotoxic agents -Per Nephrology note 9/11 patient not candidate for HD   Anemia -See CHF -Occult blood pending  Thrombocytopenia Most likely secondary to sepsis (DIC?), metastatic cancer -Currently stable no signs of overt bleeding  LEFT Hip fracture  - Per Ortho  - Mobilize as able > last orders for no weight bearing on left leg   Left foot Thromboemboli -Patient with left foot cold to touch, cyanotic toes which would be consistent with thromboemboli. Improving, continue to monitor closely -Will hold on starting Heparin (which is the only anticoagulant patient continued to be administered given her renal failure), secondary to the additional fluid load.  Moderate to severe protein calorie malnutrition -When patient off BiPAP will start tube feeds    Goals of care -Per [facility] M note 9/19 Social work is still trying to find family and appoint guardianship -9/20 PALLIATIVE CARE: Patient with multisystem organ failure, metastatic right kidney transitional cell carcinoma. Believe aggressive care would be futile. See patient for status change to DNR, short-term vs long-term goals of care -9/21 spoke with Granddaughter Ron Agee [phone]. Only relative available to speak for  patient. SHe has decided on DNR. Spoke to her concerning Comfort care, but not convinced she understood the concept over the phone. Patient states will arrive on Sunday..   DVT prophylaxis: Subcutaneous heparin Code Status: DO NOT RESUSCITATE Family Communication: See goals of care Disposition Plan: TBD   Consultants:  Orthopedics Urology PCC  M ID Nephrology   Procedures/Significant Events:  08/27 - Admit 9/4 LEFT RETROGRADE FEMORAL NAILING 09/08 - Right perc nephrostomy tube placement 9/10 PCXR:   Worsening bilateral lung opacification compared with earlier chest x-ray imaging. 09/10 - Transferred to ICU with worsening respiratory status 9/12 Echocardiogram: LVEF=  65-70%, -Grade 1 diastolic dysfunction, trivial MR, no vegetation  09/12 - Transitioned back to BiPAP overnight from [place] 5 L/m 9/13 Renal US: >> large complex mass associated with RIGHT kidney with a double-J catheter in place, moderate hydronephrosis, normal left kidney 9/17 Renal US  >> 9/19 stopped lasix gtt has no response 9/20 Transfuse 1 unit PRBC 9/21 PCXR:-Calcified granuloma lateral RML.-Bilateral pleural effusions/atelectasis/consolidation. -Mildly enlarged heart.       I have personally reviewed and interpreted all radiology studies and my findings are as above.  VENTILATOR SETTINGS: BiPAP   Cultures 8/12 blood negative  8/27 blood 1/2 positive ESBL Escherichia coli 8/27 urine positive ESBL Escherichia coli   8/27 MRSA by PCR negative  8/29 blood negative 9/5 urine negative 9/7 blood negative 9/8 MRSA by PCR negative 9/8 urine negative  9/8 urine (right kidney nephrostomy tube) positive Escherichia coli ESBL   9/17 RPR negative   9/21 blood pending 9/21 urine pending    Antimicrobials: Anti-infectives    Start     Dose/Rate Stop   11/19/16 1700  meropenem (MERREM) 500 mg in sodium chloride 0.9 % 50 mL IVPB     500 mg 100 mL/hr over 30 Minutes 11/21/16 1752   11/07/16 0900  ceFAZolin (ANCEF) IVPB 2g/100 mL premix  Status:  Discontinued     2 g 200 mL/hr over 30 Minutes 11/07/16 0934   11/02/16 0700  ceFAZolin (ANCEF) IVPB 2g/100 mL premix     2 g 200 mL/hr over 30 Minutes 11/03/16 0700   11/02/16 0700  gentamicin (GARAMYCIN) 280 mg in dextrose 5 % 100 mL IVPB  Status:  Discontinued     5 mg/kg  56.2 kg (Adjusted) 107 mL/hr  over 60 Minutes 11/02/16 1006   10/30/16 1000  fluconazole (DIFLUCAN) tablet 100 mg  Status:  Discontinued     10 0 mg 11/12/16 0926   10/30/16 0600  ceFAZolin (ANCEF) IVPB 2g/100 mL premix  Status:  Discontinued     2 g 200 mL/hr over 30 Minutes 10/30/16 1212   10/30/16 0500  meropenem (MERREM) 1 g in sodium chloride 0.9 % 100 mL IVPB  Status:  Discontinued     1 g 200 mL/hr over 30 Minutes 11/19/16 0926   10/03/2016 1630  meropenem (MERREM) 1 g in sodium chloride 0.9 % 100 mL IVPB     1 g 200 mL/hr over 30 Minutes 10/05/2016 1919   10/14/2016 1545  vancomycin (VANCOCIN) IVPB 1000 mg/200 mL premix     1,000 mg 200 mL/hr over 60 Minutes 10/07/2016 1727   10/30/2016 1545  piperacillin-tazobactam (ZOSYN) IVPB 3.375 g  Status:  Discontinued     3.375 g 100 mL/hr over 30 Minutes 10/22/2016 1546       Devices    LINES / TUBES:      Continuous Infusions:   Objective: Vitals:   11/23/16 0400 11/23/16  0457 11/23/16 0534 11/23/16 0715  BP:  129/87 (!) 147/92 115/86  Pulse:  (!) 111 (!) 115 (!) 112  Resp:  (!) 22 (!) 21 20  Temp:  99 F (37.2 C)  97.9 F (36.6 C)  TempSrc:  Axillary  Axillary  SpO2:  95% 95% 95%  Weight: 202 lb 6.1 oz (91.8 kg)     Height:        Intake/Output Summary (Last 24 hours) at 11/23/16 1117 Last data filed at 11/23/16 1000  Gross per 24 hour  Intake             1505 ml  Output               25 ml  Net             1480 ml   Filed Weights   11/21/16 0300 11/22/16 0330 11/23/16 0400  Weight: 200 lb 6.4 oz (90.9 kg) 198 lb 10.2 oz (90.1 kg) 202 lb 6.1 oz (91.8 kg)    Examination:  General: A/O 0, positive acute respiratory distress  Lungs: decreased breath sounds bilateral, negative rhonchi, negative wheezing, negative crackles  Cardiovascular: Tachycardic,, regular rhythm without murmur rub gallop. Normal S1/S2  Abdomen: Obese, negative abdominal pain, nondistended, positive soft, bowel sounds, no rebound, no ascites, no appreciable mass,  anasarca Extremities: positive left foot cyanosis (improved from 9/20), clubbing, left lower foot cold to touch,, sluggish capillary refill, DP/PT pulse +1 bilateral   Psychiatric:  Unable to assess secondary to altered mental status  Central nervous system:  Unable to assess secondary to altered mental status  .     Data Reviewed: Care during the described time interval was provided by me .  I have reviewed this patient's available data, including medical history, events of note, physical examination, and all test results as part of my evaluation.   CBC:  Recent Labs Lab 11/17/16 0435  11/19/16 0515 11/20/16 1158 11/21/16 0840 11/22/16 1530 11/23/16 0650  WBC 22.5*  < > 18.5* 19.2* 18.1* 19.8* 24.0*  NEUTROABS 17.8*  --   --  17.4*  --  17.6*  --   HGB 8.4*  < > 8.0* 8.1* 7.7* 7.1* 9.7*  HCT 28.2*  < > 26.5* 27.3* 25.7* 23.2* 31.8*  MCV 86.8  < > 87.2 88.3 88.9 88.9 90.9  PLT 216  < > 177 178 142* 128* 144*  < > = values in this interval not displayed. Basic Metabolic Panel:  Recent Labs Lab 11/19/16 0515 11/20/16 0259 11/21/16 0305 11/22/16 0500 11/23/16 0650  NA 141 140 140 141 141  K 4.4 4.6 5.1 5.0 4.7  CL 110 110 110 108 111  CO2 19* 19* 18* 20* 19*  GLUCOSE 123* 115* 168* 193* 166*  BUN 48* 51* 55* 65* 72*  CREATININE 2.58* 2.89* 3.27* 3.39* 3.62*  CALCIUM 8.3* 8.5* 8.6* 8.7* 8.7*  MG 2.4 2.6* 2.6* 2.5* 2.6*  PHOS 4.5 4.9* 5.4* 4.8* 4.7*   GFR: Estimated Creatinine Clearance: 13.7 mL/min (A) (by C-G formula based on SCr of 3.62 mg/dL (H)). Liver Function Tests:  Recent Labs Lab 11/17/16 0435  11/19/16 0515 11/20/16 0259 11/21/16 0305 11/22/16 0500 11/23/16 0650  AST 94*  --   --   --   --   --   --   ALT 9*  --   --   --   --   --   --   ALKPHOS 119  --   --   --   --   --   --  BILITOT 1.4*  --   --   --   --   --   --   PROT 4.9*  --   --   --   --   --   --   ALBUMIN 2.1*  < > 2.4* 2.4* 2.5* 2.8* 3.2*  < > = values in this interval not  displayed. No results for input(s): LIPASE, AMYLASE in the last 168 hours.  Recent Labs Lab 11/19/16 1030 11/22/16 0500  AMMONIA 23 40*   Coagulation Profile: No results for input(s): INR, PROTIME in the last 168 hours. Cardiac Enzymes: No results for input(s): CKTOTAL, CKMB, CKMBINDEX, TROPONINI in the last 168 hours. BNP (last 3 results) No results for input(s): PROBNP in the last 8760 hours. HbA1C: No results for input(s): HGBA1C in the last 72 hours. CBG:  Recent Labs Lab 11/22/16 1714 11/22/16 1917 11/22/16 2305 11/23/16 0403 11/23/16 0856  GLUCAP 122* 150* 155* 172* 166*   Lipid Profile: No results for input(s): CHOL, HDL, LDLCALC, TRIG, CHOLHDL, LDLDIRECT in the last 72 hours. Thyroid Function Tests: No results for input(s): TSH, T4TOTAL, FREET4, T3FREE, THYROIDAB in the last 72 hours. Anemia Panel: No results for input(s): VITAMINB12, FOLATE, FERRITIN, TIBC, IRON, RETICCTPCT in the last 72 hours. Urine analysis:    Component Value Date/Time   COLORURINE YELLOW 11/10/2016 0941   APPEARANCEUR CLOUDY (A) 11/10/2016 0941   LABSPEC 1.008 11/10/2016 0941   PHURINE 5.0 11/10/2016 0941   GLUCOSEU NEGATIVE 11/10/2016 0941   HGBUR LARGE (A) 11/10/2016 0941   BILIRUBINUR NEGATIVE 11/10/2016 0941   BILIRUBINUR small (A) 07/26/2016 1432   KETONESUR NEGATIVE 11/10/2016 0941   PROTEINUR 30 (A) 11/10/2016 0941   UROBILINOGEN 0.2 07/26/2016 1432   NITRITE NEGATIVE 11/10/2016 0941   LEUKOCYTESUR LARGE (A) 11/10/2016 0941   Sepsis Labs: @LABRCNTIP (procalcitonin:4,lacticidven:4)  )No results found for this or any previous visit (from the past 240 hour(s)).       Radiology Studies: Dg Chest Port 1 View  Result Date: 11/22/2016 CLINICAL DATA:  Respiratory distress EXAM: PORTABLE CHEST 1 VIEW COMPARISON:  November 21, 2016 FINDINGS: Feeding tube tip is below the diaphragm. Central catheter tip is in the superior vena cava, stable. There is an apparent calcified  granuloma in the lateral right mid lung. There are pleural effusions bilaterally with bibasilar atelectasis/ consolidation, stable. Heart is mildly enlarged. There is mild pulmonary venous hypertension. No evident adenopathy. There is a total shoulder replacement on the right, stable. IMPRESSION: Findings felt to represent a degree of congestive heart failure, stable. Atelectasis/ consolidation in both lung bases noted. A degree of superimposed pneumonia in the lung bases cannot be excluded radiographically. There is a calcified granuloma in the right mid lung. Feeding tube tip is below the diaphragm. Central catheter tip in superior vena cava, stable. No pneumothorax. Electronically Signed   By: Lowella Grip III M.D.   On: 11/22/2016 12:02   Dg Chest Port 1 View  Result Date: 11/21/2016 CLINICAL DATA:  Hypoxia. EXAM: PORTABLE CHEST 1 VIEW COMPARISON:  11/20/2016 FINDINGS: Shallow inspiration. Probable bilateral pleural effusions with basilar atelectasis or consolidation bilaterally. Heart size is obscured by the pleural/parenchymal process. No definite vascular congestion. No pneumothorax. Calcified granuloma in the right mid lung. Since the previous study, an enteric tube has been placed. The tip is off the field of view but below the left hemidiaphragm. Left PICC catheter remains in place with tip over the cavoatrial junction region. Postoperative changes in the right shoulder. Degenerative changes in the  spine and left shoulder. IMPRESSION: Appliances appear in satisfactory position. Persistent bilateral pleural effusions and basilar atelectasis or consolidation without change. Electronically Signed   By: Lucienne Capers M.D.   On: 11/21/2016 21:54        Scheduled Meds: . atorvastatin  40 mg Oral QHS  . budesonide (PULMICORT) nebulizer solution  0.5 mg Nebulization BID  . chlorhexidine gluconate (MEDLINE KIT)  15 mL Mouth Rinse BID  . cycloSPORINE  1 drop Both Eyes BID  . feeding  supplement (NEPRO CARB STEADY)  1,000 mL Per Tube Q24H  . heparin subcutaneous  5,000 Units Subcutaneous Q8H  . hydrocortisone sod succinate (SOLU-CORTEF) inj  50 mg Intravenous Q24H   Followed by  . [START ON 11/24/2016] hydrocortisone sod succinate (SOLU-CORTEF) inj  25 mg Intravenous Q24H  . ipratropium-albuterol  3 mL Nebulization TID  . lidocaine  1 patch Transdermal Daily  . mouth rinse  15 mL Mouth Rinse QID  . midodrine  10 mg Oral TID  . QUEtiapine  50 mg Per NG tube QHS  . sodium chloride flush  10-40 mL Intracatheter Q12H   Continuous Infusions:   LOS: 25 days    Time spent: 40 minutes    WOODS, Geraldo Docker, MD [facility] Pager [phone]   If 7PM-7AM, please contact night-coverage [email or web address] Password [email or web address] 11/23/2016, 11:17 AM

## 2016-11-23 NOTE — Progress Notes (Signed)
12 noon lactic acid-4.1. md aware, to repeat at 315pm as ordered.

## 2016-11-23 NOTE — Progress Notes (Signed)
Spoke with sierra barnhart- grandaughter on the phone and confirmed about the decision to have pt on DNR status as discussed with Dr. Sherral Hammers.Marland Kitchen

## 2016-11-23 NOTE — Progress Notes (Signed)
Pharmacy Antibiotic Note  Isabel Collins is a 76 y.o. female admitted on 10/12/2016 with ESBL bacteremia.  Pt completed course of Meropenem on 9/19 but has since developed hypotension, elevated WBC, and elevated lactic acid.  MD has requested pharmacy restart Meropenem dosing.  Plan: Meropenem 500mg  IV q12h Follow-up repeat cx data, clinical progress, LOT   Height: 5\' 2"  (157.5 cm) Weight: 202 lb 6.1 oz (91.8 kg) IBW/kg (Calculated) : 50.1  Temp (24hrs), Avg:98.6 F (37 C), Min:97.6 F (36.4 C), Max:99.2 F (37.3 C)   Recent Labs Lab 11/19/16 0515 11/20/16 0259 11/20/16 1158 11/21/16 0305 11/21/16 0840 11/22/16 0500 11/22/16 1530 11/22/16 2028 11/23/16 0650 11/23/16 1240 11/23/16 1521  WBC 18.5*  --  19.2*  --  18.1*  --  19.8*  --  24.0*  --   --   CREATININE 2.58* 2.89*  --  3.27*  --  3.39*  --   --  3.62*  --   --   LATICACIDVEN  --  2.9*  --   --   --   --  4.0* 4.4*  --  4.1* 4.4*    Estimated Creatinine Clearance: 13.7 mL/min (A) (by C-G formula based on SCr of 3.62 mg/dL (H)).    No Known Allergies  Antimicrobials this admission: 8/27 vanc x 1  8/27 meropenem >> 9/19; restart 9/21 >> PTA fluconazole>>9/10   Dose adjustments this admission:   Microbiology results: 8/27 BCx: ESBL E.coli 8/27 UCx: ESBL E.coli  8/27 MRSA PCR: neg 8/29 BCx: NGTD x5 9/5 urine: neg 9/7 BCx x 2: neg 9/8 UCx: neg 9/8 MRSA: neg 9/8 kidney fluid Cx: ESBL E.coli  Thank you for allowing pharmacy to be a part of this patient's care.  [employer], Pharm.D., BCPS Clinical Pharmacist 11/23/2016 8:25 PM

## 2016-11-23 NOTE — Progress Notes (Signed)
Latest lactic acid-4.4 as relayed by the lab. Md made aware.

## 2016-11-23 NOTE — Progress Notes (Signed)
All foam dressings on abdomen , left thigh soaked with clear fluid from seepage changed. Minimal bleeding to left lower abd. post lovenox injection  site from last night noted , controlled with pressure dressing. Washed up and repositioned.

## 2016-11-24 DIAGNOSIS — Z515 Encounter for palliative care: Secondary | ICD-10-CM

## 2016-11-24 DIAGNOSIS — C7911 Secondary malignant neoplasm of bladder: Secondary | ICD-10-CM

## 2016-11-24 DIAGNOSIS — S72002A Fracture of unspecified part of neck of left femur, initial encounter for closed fracture: Secondary | ICD-10-CM

## 2016-11-24 DIAGNOSIS — R34 Anuria and oliguria: Secondary | ICD-10-CM

## 2016-11-24 LAB — BLOOD CULTURE ID PANEL (REFLEXED)
Acinetobacter baumannii: NOT DETECTED
CANDIDA GLABRATA: NOT DETECTED
CANDIDA KRUSEI: NOT DETECTED
CANDIDA PARAPSILOSIS: NOT DETECTED
CANDIDA TROPICALIS: NOT DETECTED
Candida albicans: NOT DETECTED
Carbapenem resistance: NOT DETECTED
ESCHERICHIA COLI: NOT DETECTED
Enterobacter cloacae complex: NOT DETECTED
Enterobacteriaceae species: NOT DETECTED
Enterococcus species: NOT DETECTED
Haemophilus influenzae: NOT DETECTED
KLEBSIELLA OXYTOCA: NOT DETECTED
KLEBSIELLA PNEUMONIAE: NOT DETECTED
LISTERIA MONOCYTOGENES: NOT DETECTED
Methicillin resistance: DETECTED — AB
Neisseria meningitidis: NOT DETECTED
PROTEUS SPECIES: NOT DETECTED
Pseudomonas aeruginosa: NOT DETECTED
SERRATIA MARCESCENS: NOT DETECTED
STREPTOCOCCUS SPECIES: NOT DETECTED
Staphylococcus aureus (BCID): NOT DETECTED
Staphylococcus species: DETECTED — AB
Streptococcus agalactiae: NOT DETECTED
Streptococcus pneumoniae: NOT DETECTED
Streptococcus pyogenes: NOT DETECTED
Vancomycin resistance: NOT DETECTED

## 2016-11-24 LAB — BLOOD GAS, ARTERIAL
Acid-base deficit: 5.8 mmol/L — ABNORMAL HIGH (ref 0.0–2.0)
BICARBONATE: 17.7 mmol/L — AB (ref 20.0–28.0)
Delivery systems: POSITIVE
Drawn by: 33100
EXPIRATORY PAP: 5
FIO2: 50
Inspiratory PAP: 10
O2 SAT: 90.2 %
PATIENT TEMPERATURE: 97.3
PH ART: 7.435 (ref 7.350–7.450)
PO2 ART: 56.5 mmHg — AB (ref 83.0–108.0)
pCO2 arterial: 26.6 mmHg — ABNORMAL LOW (ref 32.0–48.0)

## 2016-11-24 LAB — BPAM RBC
Blood Product Expiration Date: 201810092359
ISSUE DATE / TIME: 201809210147
UNIT TYPE AND RH: 6200

## 2016-11-24 LAB — RENAL FUNCTION PANEL
Albumin: 2.8 g/dL — ABNORMAL LOW (ref 3.5–5.0)
Anion gap: 13 (ref 5–15)
BUN: 78 mg/dL — AB (ref 6–20)
CALCIUM: 8.7 mg/dL — AB (ref 8.9–10.3)
CHLORIDE: 108 mmol/L (ref 101–111)
CO2: 18 mmol/L — AB (ref 22–32)
CREATININE: 3.87 mg/dL — AB (ref 0.44–1.00)
GFR, EST AFRICAN AMERICAN: 12 mL/min — AB (ref >60)
GFR, EST NON AFRICAN AMERICAN: 10 mL/min — AB (ref >60)
Glucose, Bld: 122 mg/dL — ABNORMAL HIGH (ref 65–99)
Phosphorus: 5.2 mg/dL — ABNORMAL HIGH (ref 2.5–4.6)
Potassium: 5 mmol/L (ref 3.5–5.1)
SODIUM: 139 mmol/L (ref 135–145)

## 2016-11-24 LAB — CBC
HEMATOCRIT: 31.7 % — AB (ref 36.0–46.0)
HEMOGLOBIN: 9.6 g/dL — AB (ref 12.0–15.0)
MCH: 27 pg (ref 26.0–34.0)
MCHC: 30.3 g/dL (ref 30.0–36.0)
MCV: 89.3 fL (ref 78.0–100.0)
Platelets: 120 10*3/uL — ABNORMAL LOW (ref 150–400)
RBC: 3.55 MIL/uL — AB (ref 3.87–5.11)
RDW: 22.4 % — ABNORMAL HIGH (ref 11.5–15.5)
WBC: 21.7 10*3/uL — ABNORMAL HIGH (ref 4.0–10.5)

## 2016-11-24 LAB — GLUCOSE, CAPILLARY
GLUCOSE-CAPILLARY: 133 mg/dL — AB (ref 65–99)
Glucose-Capillary: 128 mg/dL — ABNORMAL HIGH (ref 65–99)

## 2016-11-24 LAB — TYPE AND SCREEN
ABO/RH(D): A POS
ANTIBODY SCREEN: NEGATIVE
Unit division: 0

## 2016-11-24 LAB — MAGNESIUM: MAGNESIUM: 2.6 mg/dL — AB (ref 1.7–2.4)

## 2016-11-24 LAB — AMMONIA: Ammonia: 82 umol/L — ABNORMAL HIGH (ref 9–35)

## 2016-11-24 LAB — LACTIC ACID, PLASMA
LACTIC ACID, VENOUS: 3.4 mmol/L — AB (ref 0.5–1.9)
Lactic Acid, Venous: 2.4 mmol/L (ref 0.5–1.9)

## 2016-11-24 MED ORDER — ACETAMINOPHEN 650 MG RE SUPP: 650.0000 mg | Freq: Four times a day (QID) | RECTAL | Status: DC | PRN

## 2016-11-24 MED ORDER — BIOTENE DRY MOUTH MT LIQD: 15.0000 mL | OROMUCOSAL | Status: DC | PRN

## 2016-11-24 MED ORDER — POLYVINYL ALCOHOL 1.4 % OP SOLN
1.0000 [drp] | Freq: Four times a day (QID) | OPHTHALMIC | Status: DC | PRN
  Filled 2016-11-24: qty 15

## 2016-11-24 MED ORDER — ONDANSETRON 4 MG PO TBDP: 4.0000 mg | ORAL_TABLET | Freq: Four times a day (QID) | ORAL | Status: DC | PRN

## 2016-11-24 MED ORDER — GLYCOPYRROLATE 0.2 MG/ML IJ SOLN: 0.2000 mg | INTRAMUSCULAR | Status: DC | PRN

## 2016-11-24 MED ORDER — HALOPERIDOL LACTATE 5 MG/ML IJ SOLN: 0.5000 mg | INTRAMUSCULAR | Status: DC | PRN

## 2016-11-24 MED ORDER — ONDANSETRON HCL 4 MG/2ML IJ SOLN: 4.0000 mg | Freq: Four times a day (QID) | INTRAMUSCULAR | Status: DC | PRN

## 2016-11-24 MED ORDER — HYDROMORPHONE HCL 1 MG/ML IJ SOLN
0.5000 mg | INTRAMUSCULAR | Status: DC | PRN
  Administered 2016-11-25: 0.5 mg via INTRAVENOUS
  Filled 2016-11-24: qty 0.5

## 2016-11-24 MED ORDER — HALOPERIDOL 1 MG PO TABS
0.5000 mg | ORAL_TABLET | ORAL | Status: DC | PRN
  Filled 2016-11-24: qty 1

## 2016-11-24 MED ORDER — ALBUMIN HUMAN 25 % IV SOLN
75.0000 g | Freq: Once | INTRAVENOUS | Status: AC
  Administered 2016-11-24: 75 g via INTRAVENOUS
  Filled 2016-11-24: qty 300

## 2016-11-24 MED ORDER — LORAZEPAM 1 MG PO TABS: 1.0000 mg | ORAL_TABLET | ORAL | Status: DC | PRN

## 2016-11-24 MED ORDER — LORAZEPAM 2 MG/ML IJ SOLN: 1.0000 mg | INTRAMUSCULAR | Status: DC | PRN

## 2016-11-24 MED ORDER — LACTULOSE 10 GM/15ML PO SOLN: 30.0000 g | Freq: Two times a day (BID) | ORAL | Status: DC

## 2016-11-24 MED ORDER — GLYCOPYRROLATE 1 MG PO TABS
1.0000 mg | ORAL_TABLET | ORAL | Status: DC | PRN
  Filled 2016-11-24: qty 1

## 2016-11-24 MED ORDER — GLYCOPYRROLATE 0.2 MG/ML IJ SOLN
0.2000 mg | INTRAMUSCULAR | Status: DC | PRN
  Administered 2016-11-25 (×2): 0.2 mg via INTRAVENOUS
  Filled 2016-11-24 (×2): qty 1

## 2016-11-24 MED ORDER — HALOPERIDOL LACTATE 2 MG/ML PO CONC
0.5000 mg | ORAL | Status: DC | PRN
  Filled 2016-11-24: qty 0.3

## 2016-11-24 MED ORDER — ACETAMINOPHEN 325 MG PO TABS: 650.0000 mg | ORAL_TABLET | Freq: Four times a day (QID) | ORAL | Status: DC | PRN

## 2016-11-24 MED ORDER — LORAZEPAM 2 MG/ML PO CONC: 1.0000 mg | ORAL | Status: DC | PRN

## 2016-11-24 NOTE — Progress Notes (Signed)
Family arrived and wanted patient to be made comfort care. Review of patient chart appears to show poor prognosis.  Discontinued Bipap and placed on comfort care measures per family request.

## 2016-11-24 NOTE — Progress Notes (Signed)
PHARMACY - PHYSICIAN COMMUNICATION CRITICAL VALUE ALERT - BLOOD CULTURE IDENTIFICATION (BCID)  Results for orders placed or performed during the hospital encounter of 11/01/2016  Blood Culture ID Panel (Reflexed) (Collected: 11/23/2016  8:21 PM)  Result Value Ref Range   Enterococcus species NOT DETECTED NOT DETECTED   Vancomycin resistance NOT DETECTED NOT DETECTED   Listeria monocytogenes NOT DETECTED NOT DETECTED   Staphylococcus species DETECTED (A) NOT DETECTED   Staphylococcus aureus NOT DETECTED NOT DETECTED   Methicillin resistance DETECTED (A) NOT DETECTED   Streptococcus species NOT DETECTED NOT DETECTED   Streptococcus agalactiae NOT DETECTED NOT DETECTED   Streptococcus pneumoniae NOT DETECTED NOT DETECTED   Streptococcus pyogenes NOT DETECTED NOT DETECTED   Acinetobacter baumannii NOT DETECTED NOT DETECTED   Enterobacteriaceae species NOT DETECTED NOT DETECTED   Enterobacter cloacae complex NOT DETECTED NOT DETECTED   Escherichia coli NOT DETECTED NOT DETECTED   Klebsiella oxytoca NOT DETECTED NOT DETECTED   Klebsiella pneumoniae NOT DETECTED NOT DETECTED   Proteus species NOT DETECTED NOT DETECTED   Serratia marcescens NOT DETECTED NOT DETECTED   Carbapenem resistance NOT DETECTED NOT DETECTED   Haemophilus influenzae NOT DETECTED NOT DETECTED   Neisseria meningitidis NOT DETECTED NOT DETECTED   Pseudomonas aeruginosa NOT DETECTED NOT DETECTED   Candida albicans NOT DETECTED NOT DETECTED   Candida glabrata NOT DETECTED NOT DETECTED   Candida krusei NOT DETECTED NOT DETECTED   Candida parapsilosis NOT DETECTED NOT DETECTED   Candida tropicalis NOT DETECTED NOT DETECTED    Name of physician (or Provider) Contacted: Woods  Changes to prescribed antibiotics required: N/A  Harvel Quale 11/24/2016  7:47 PM

## 2016-11-24 NOTE — Progress Notes (Signed)
PROGRESS NOTE    Isabel Collins  SMO:[id number] DOB: 17-Jun-1940 DOA: 10/26/2016 PCP: [name], MD   Brief Narrative:  76 y.o. 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  Patient tells me that yesterday she was assisted to and from the bedside commode, and fell to the ground.  She denies any syncope or passing out episode.  She has been complaining of persistent left knee pain and decided to come to the emergency room today.  She has a history of left knee replacement on about 10 years ago outside [place].  She was recently hospitalized and discharged about 13 days ago, and at that time she was diagnosed with an ESBL UTI and on discharge she had a PICC line and was on meropenem for a few more days.  Meropenem appears to have been finished and she is now on nitrofurantoin.  She still has the PICC line in place.  Patient denies any chest pain, denies any palpitations, she has no abdominal pain, denies any nausea or vomiting.  She denies any dysuria.  She denies cough / sputum production / congestion.  She appears a bit sleepy in the ED. patient also states that she has been eating and drinking much because of the pain.   ED Course: In the emergency room she is afebrile, her blood pressure was initially in the 92E systolic however improved into the 100s with fluids, she is satting well on 2 L nasal cannula, her blood work reveals acute kidney injury with a creatinine of 2.1 from prior normal values, and a leukocytosis of 19.  Left knee x-ray showed an acute displaced distal femur fracture on the superior aspect of femur hardware without dislocation.  ED physician discussed with orthopedic surgery and recommended surgical repair.  Will  transfer patient to [facility] for OR time over there.    Subjective: 9/22 unresponsive to stimuli.    Assessment & Plan:   Principal Problem:   Acute respiratory distress Active Problems:   Hypertension   Physical deconditioning   Hypothyroidism   Chronic obstructive pulmonary disease (HCC)   Type 2 diabetes mellitus with complication, without long-term current use of insulin (HCC)   Urothelial carcinoma of kidney, right (HCC)   Gastroesophageal reflux disease   History of recent fall   Right renal mass   Fracture of knee prosthesis (HCC)   AKI (acute kidney injury) ([place])   ESBL (extended spectrum beta-lactamase) producing bacteria infection   Closed comminuted supracondylar fracture of left femur (HCC)   Renal failure   Fall   Sepsis due to Escherichia coli (E. coli) (HCC)   Gram-negative bacteremia   Sepsis secondary to UTI ([place])   Transitional cell carcinoma (HCC)   Hypervolemia   HCAP (healthcare-associated pneumonia)   Leukocytosis   Encephalopathy acute   Hydronephrosis of right kidney   Abnormal CT scan, pelvis: 11/09/2016   Closed fracture of left distal femur (HCC)   Pleural effusion on right   Hypoalbuminemia   Acute renal failure (ARF) (HCC)   Acute respiratory failure with hypoxia (HCC)   Acute respiratory failure with hypoxia -Continues on BiPAP. Attempt to wean, SPO2 goal> 93% -9/20 PCXR: Consistent overload, secondary to CHF. Possible pneumonia. Atelectasis.  -  ABG: Pending  -Concur with [facility] M that intubation would not be in patient's best interest considering her multisystem organ failure.  Bilateral pleural effusions -Titrate O2 to maintain SPO2> 93% -BiPAP PRN  Lactic acidosis/Leukocytosis -Most likely multifactorial sepsis (pneumonia?, UTI ?) Metastatic RIGHT kidney transitional cell cancer, renal failure.  -Patient has completed course of multiple antibiotics now with increasing leukocytosis and lactic acid -Blood/urine cultures  pending. -Mild decrease WBC continue to monitor  Septic shock/positive ESBL bacteremia & UTI -Bacteremia?, Intra-Abdominal infection? -Patient continues to be obtunded  -Bacteremia previously cleared: See blood culture 9/7.-Negative vegetation per echocardiogram -Antibiotic regimen completed per ID recommendation -Lactic acidosis trending up.    Recent Labs Lab 11/20/16 0259 11/22/16 1530 11/22/16 2028 11/23/16 1240 11/23/16 1521  LATICACIDVEN 2.9* 4.0* 4.4* 4.1* 4.4*  -Continue Meropenem empirically. - patient continues to be Anuric, despite fluid resuscitation. Patient intravascularly depleted, but overall fluid overloaded. -Patient's prognosis is extremely poor (appears to be actively dying.)   Elevated Ammonia -Hold on used lactulose. Patient's granddaughter on the way here from [place], has spoken with PALLIATIVE care would like patient to be comfortable continue current care until she arrives but no escalation.  Obstructive uropathy -S/P right nephrostomy tube placement: Appears to be draining frank blood.  Chronic Diastolic CHF  -strict in and out since admission +17.2 L -Daily weight  Filed Weights   11/22/16 0330 11/23/16 0400 11/24/16 0356  Weight: 198 lb 10.2 oz (90.1 kg) 202 lb 6.1 oz (91.8 kg) 197 lb 8.5 oz (89.6 kg)  -Transfuse for hemoglobin <8 -9/20 Transfuse 1 unit PRBC  Hypotension -Hold all narcotics and benzodiazepine -Solu-Cortef 100 mg TID -Midodrin 10 mg TID -9/22 albumin 75 g -MAP goal> 65  Acute Encephalopathy  -Multifactorial septic shock, CHF, pleural effusions, delirium? 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 -Urology believe patient may be well past curative stage and recommend palliative care/hospice consultation  Acute renal  failure/Anuric -Prior to this admission patient's creatinine normal. Renal failure secondary to metastatic right kidney transitional cell carcinoma/obstructive uropathy. Left kidney normal. -9/8 S/P percutaneous nephrostomy tube placed   Recent Labs Lab 11/19/16 0515 11/20/16 0259 11/21/16 0305 11/22/16 0500 11/23/16 0650 11/24/16 0444  CREATININE 2.58* 2.89* 3.27* 3.39* 3.62* 3.87*  -Follow patient clearly fluid overloaded (anasarca) patient's BP will not tolerate diuresis at this time.  --Avoid nephrotoxic agents -Per Nephrology note 9/11 patient not candidate for HD   Anemia -See CHF -Occult blood pending  Thrombocytopenia -Most like secondary to sepsis (DIC?), metastatic cancer -No overt signs of bleeding   LEFT Hip fracture  - Per Ortho  - Mobilize as able > last orders for no weight bearing on left leg   Left foot Thromboemboli -Patient with left foot cold to touch, cyanotic toes which would be consistent with thromboemboli. Improving, continue to monitor closely -Given patient's fluid overload and slightly improvement no role for Heparin.  Moderate to severe protein calorie malnutrition -When patient off BiPAP will start tube feeds    Goals of care -Per [facility] M note 9/19 Social work is still trying to find family and appoint guardianship -9/20 PALLIATIVE CARE: Patient with multisystem organ failure, metastatic right kidney transitional cell carcinoma. Believe aggressive care would be futile. See patient for status change to DNR, short-term vs long-term goals of care -9/21 spoke with Granddaughter Ron Agee [phone]. Only relative  available to speak for patient. SHe has decided on DNR. Spoke to her concerning Comfort care, but not convinced she understood the concept over the phone. Patient states will arrive on _0 0 mg 11/12/16 0926   10/30/16 0600  ceFAZolin (ANCEF) IVPB 2g/100 mL premix  Status:  Discontinued     2 g 200 mL/hr over 30 Minutes 10/30/16 1212   10/30/16 0500  meropenem (MERREM) 1 g in sodium chloride 0.9 % 100 mL IVPB  Status:  Discontinued     1 g 200 mL/hr over 30 Minutes 11/19/16 0926   10/05/2016 1630  meropenem (MERREM) 1 g in sodium chloride 0.9 % 100 mL IVPB     1 g 200 mL/hr over 30 Minutes 10/05/2016 1919   10/15/2016 1545  vancomycin (VANCOCIN) IVPB 1000 mg/200 mL premix     1,000 mg 200 mL/hr over 60 Minutes 10/03/2016 1727   10/03/2016 1545  piperacillin-tazobactam (ZOSYN) IVPB 3.375 g  Status:  Discontinued     3.375 g 100 mL/hr over 30 Minutes 10/25/2016 1546  Devices    LINES / TUBES:      Continuous Infusions: . meropenem (MERREM) IV 500 mg (11/24/16 0819)     Objective: Vitals:   11/24/16 0356 11/24/16 0400 11/24/16 0428 11/24/16 0809  BP:  113/63 113/63 (!) 102/56  Pulse: (!) 109 (!) 106 (!) 108 93  Resp: 19 (!) 21 18   Temp: 97.7 F (36.5 C)   (!) 97.3 F (36.3 C)  TempSrc: Axillary   Axillary  SpO2: 93% 94% 94%   Weight: 197 lb 8.5 oz (89.6 kg)     Height:        Intake/Output Summary (Last 24 hours) at 11/24/16 0859 Last data filed at 11/24/16 0400  Gross per 24 hour  Intake              710 ml  Output              175 ml  Net              535 ml   Filed Weights   11/22/16 0330 11/23/16 0400 11/24/16 0356  Weight: 198 lb 10.2 oz (90.1 kg) 202 lb 6.1 oz (91.8 kg) 197 lb 8.5 oz (89.6 kg)    Examination:  General: Obtunded, positive acute respiratory distress   Lungs: decreased breath sounds bilateral, negative rhonchi,  negative wheezing, negative crackles  Cardiovascular: Tachycardic, regular rhythm without murmurs rubs or gallops, normal S1/S2,, Abdomen: Obese, negative abdominal pain, nondistended, positive soft, bowel sounds, no rebound, no ascites, no appreciable mass, or Anasarca Extremities: positive left foot cyanosis, clubbing, left lower foot cold to touch, sluggish capillary refill, DP/PT pulse +1 bilateral   Psychiatric:  Unable to assess secondary to altered mental status  Central nervous system:  Unable to assess secondary to altered mental status  .     Data Reviewed: Care during the described time interval was provided by me .  I have reviewed this patient's available data, including medical history, events of note, physical examination, and all test results as part of my evaluation.   CBC:  Recent Labs Lab 11/20/16 1158 11/21/16 0840 11/22/16 1530 11/23/16 0650 11/24/16 0445  WBC 19.2* 18.1* 19.8* 24.0* 21.7*  NEUTROABS 17.4*  --  17.6*  --   --   HGB 8.1* 7.7* 7.1* 9.7* 9.6*  HCT 27.3* 25.7* 23.2* 31.8* 31.7*  MCV 88.3 88.9 88.9 90.9 89.3  PLT 178 142* 128* 144* 272*   Basic Metabolic Panel:  Recent Labs Lab 11/20/16 0259 11/21/16 0305 11/22/16 0500 11/23/16 0650 11/24/16 0444 11/24/16 0445  NA 140 140 141 141 139  --   K 4.6 5.1 5.0 4.7 5.0  --   CL 110 110 108 111 108  --   CO2 19* 18* 20* 19* 18*  --   GLUCOSE 115* 168* 193* 166* 122*  --   BUN 51* 55* 65* 72* 78*  --   CREATININE 2.89* 3.27* 3.39* 3.62* 3.87*  --   CALCIUM 8.5* 8.6* 8.7* 8.7* 8.7*  --   MG 2.6* 2.6* 2.5* 2.6*  --  2.6*  PHOS 4.9* 5.4* 4.8* 4.7* 5.2*  --    GFR: Estimated Creatinine Clearance: 12.7 mL/min (A) (by C-G formula based on SCr of 3.87 mg/dL (H)). Liver Function Tests:  Recent Labs Lab 11/20/16 0259 11/21/16 0305 11/22/16 0500 11/23/16 0650 11/24/16 0444  ALBUMIN 2.4* 2.5* 2.8* 3.2* 2.8*   No results for input(s): LIPASE, AMYLASE in the last 168 hours.  Recent Labs Lab  11/19/16  1030 11/22/16 0500 11/24/16 0506  AMMONIA 23 40* 82*   Coagulation Profile: No results for input(s): INR, PROTIME in the last 168 hours. Cardiac Enzymes: No results for input(s): CKTOTAL, CKMB, CKMBINDEX, TROPONINI in the last 168 hours. BNP (last 3 results) No results for input(s): PROBNP in the last 8760 hours. HbA1C: No results for input(s): HGBA1C in the last 72 hours. CBG:  Recent Labs Lab 11/23/16 0856 11/23/16 1153 11/23/16 1626 11/23/16 1921 11/24/16 0353  GLUCAP 166* 180* 141* 151* 128*   Lipid Profile: No results for input(s): CHOL, HDL, LDLCALC, TRIG, CHOLHDL, LDLDIRECT in the last 72 hours. Thyroid Function Tests: No results for input(s): TSH, T4TOTAL, FREET4, T3FREE, THYROIDAB in the last 72 hours. Anemia Panel: No results for input(s): VITAMINB12, FOLATE, FERRITIN, TIBC, IRON, RETICCTPCT in the last 72 hours. Urine analysis:    Component Value Date/Time   COLORURINE YELLOW 11/10/2016 0941   APPEARANCEUR CLOUDY (A) 11/10/2016 0941   LABSPEC 1.008 11/10/2016 0941   PHURINE 5.0 11/10/2016 0941   GLUCOSEU NEGATIVE 11/10/2016 0941   HGBUR LARGE (A) 11/10/2016 0941   BILIRUBINUR NEGATIVE 11/10/2016 0941   BILIRUBINUR small (A) 07/26/2016 1432   KETONESUR NEGATIVE 11/10/2016 0941   PROTEINUR 30 (A) 11/10/2016 0941   UROBILINOGEN 0.2 07/26/2016 1432   NITRITE NEGATIVE 11/10/2016 0941   LEUKOCYTESUR LARGE (A) 11/10/2016 0941   Sepsis Labs: _0 (procalcitonin:4,lacticidven:4)  )No results found for this or any previous visit (from the past 240 hour(s)).       Radiology Studies: Dg Chest Port 1 View  Result Date: 11/22/2016 CLINICAL DATA:  Respiratory distress EXAM: PORTABLE CHEST 1 VIEW COMPARISON:  November 21, 2016 FINDINGS: Feeding tube tip is below the diaphragm. Central catheter tip is in the superior vena cava, stable. There is an apparent calcified granuloma in the lateral right mid lung. There are pleural effusions bilaterally  with bibasilar atelectasis/ consolidation, stable. Heart is mildly enlarged. There is mild pulmonary venous hypertension. No evident adenopathy. There is a total shoulder replacement on the right, stable. IMPRESSION: Findings felt to represent a degree of congestive heart failure, stable. Atelectasis/ consolidation in both lung bases noted. A degree of superimposed pneumonia in the lung bases cannot be excluded radiographically. There is a calcified granuloma in the right mid lung. Feeding tube tip is below the diaphragm. Central catheter tip in superior vena cava, stable. No pneumothorax. Electronically Signed   By: Lowella Grip III M.D.   On: 11/22/2016 12:02        Scheduled Meds: . atorvastatin  40 mg Oral QHS  . budesonide (PULMICORT) nebulizer solution  0.5 mg Nebulization BID  . chlorhexidine gluconate (MEDLINE KIT)  15 mL Mouth Rinse BID  . cycloSPORINE  1 drop Both Eyes BID  . heparin subcutaneous  5,000 Units Subcutaneous Q8H  . hydrocortisone sod succinate (SOLU-CORTEF) inj  100 mg Intravenous Q8H  . ipratropium-albuterol  3 mL Nebulization TID  . lidocaine  1 patch Transdermal Daily  . mouth rinse  15 mL Mouth Rinse QID  . midodrine  10 mg Oral TID  . sodium chloride flush  10-40 mL Intracatheter Q12H   Continuous Infusions: . meropenem (MERREM) IV 500 mg (11/24/16 0819)     LOS: 26 days    Time spent: 40 minutes    WOODS, Geraldo Docker, MD [facility] Pager [phone]   If 7PM-7AM, please contact night-coverage [email or web address] Password [facility] 11/24/2016, 8:59 AM

## 2016-11-24 NOTE — Consult Note (Signed)
Consultation Note Date: 11/24/2016   Patient Name: Isabel Collins  DOB: 11-10-1940  MRN: [id number]  Age / Sex: 76 y.o., female  PCP: [name], MD Referring Physician: Allie Bossier, MD  Reason for Consultation: Establishing goals of care, Non pain symptom management, Pain control, Psychosocial/spiritual support and Terminal Care  HPI/Patient Profile: 76 y.o. 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 Her left knee is the site of a prior total knee replacement. Findings revealed acute left distal femur fracture; ortho consulted and recommended surgery. However in the interim she has continued to decline despite supportive therapy, antibiotics. Patient was just admitted to the [facility] system on 11/24/2016 with the urinary tract infection and was discharged to a skilled nursing facility with a PICC line to continue IV antibiotics  As noted, despite these interventions, she has continued to deteriorate, now with no urine output, increasing creatinine, now 3.87, lactic acid 4.4, and  BiPAP dependant now   Chart reviewed. Consult for goals of care, in patient with limited family; now unable to speak for herself due to critical illness. Per Education officer, museum notes, a granddaughter, Alric Seton has been notified and located and is willing to make healthcare decisions. After speaking with attending on 11/23/2016, granddaughter supports  DO NOT RESUSCITATE satus  Clinical Assessment and Goals of Care: Pt is unable to participate in assessment. She is unresponsive to noxious stimuli. She is now.BiPAP dependent. Per Education officer, museum notes patient has an ex-husband that has been contacted by social  work but is unwilling to make healthcare decisions. She also per Education officer, museum notes, has a son who is in prison. Ms. Onnie Boer, indicated to attending, that she was coming to [facility] on 11/25/2016  Alric Seton, granddaughter [phone];   Spoke to Mrs. Barnhart on the phone on 11/24/2016. She is in route from [place] and states she is approximately 6 hours away. Her sister, Apolonio Schneiders, who is patient's granddaughter, is also on her way. Ms. Onnie Boer seems to recognize that her grandmother is dying and is hoping to make it to [place] before she passes.   SUMMARY OF RECOMMENDATIONS   DO NOT RESUSCITATE DO NOT INTUBATE Continue BiPAP for now until granddaughters arrive in town Did tell granddaughter that unfortunately despite everything being done to treat infection she is nearing end-of-life with multisystem failure. She is tearful but does seem to grasp that this is the's in the situation that she will be facing when she arrives in [place] with palliative at 0900 11/25/16  Code Status/Advance Care Planning:  DNR    Symptom Management:  Dyspnea: Continue BiPAP for now until granddaughters arrive in town tonight. At that point when family is ready to transition to full comfort care, would recommend Dilaudid continuous infusion (patient is in renal failure and would not metabolize morphine well)  with both basal and bolus rate  Associated agitation: We'll recommend starting Versed PCA at 2 mg an hour  Palliative Prophylaxis:   Aspiration, Bowel Regimen, Delirium Protocol, Eye Care, Frequent Pain Assessment, Oral Care and Turn Reposition  Additional Recommendations (Limitations, Scope, Preferences):  No Artificial Feeding, No Blood Transfusions, No Chemotherapy, No Hemodialysis, No Radiation, No Surgical Procedures and No Tracheostomy  Psycho-social/Spiritual:   Desire for further Chaplaincy support:no  Additional Recommendations: Grief/Bereavement  Support  Prognosis:   Hours - Days  Discharge Planning: Anticipated Hospital Death      Primary Diagnoses: Present on Admission: . Hypertension . Hypothyroidism . Physical deconditioning . Gastroesophageal reflux disease . Chronic obstructive pulmonary disease ([place]) . Type 2 diabetes mellitus with complication, without long-term current use of insulin ([place]) . Urothelial carcinoma of kidney, right ([place]) . Right renal mass . Closed comminuted supracondylar fracture of left femur ([place]) . Abnormal CT scan, pelvis: 11/09/2016 . Hypoalbuminemia   I have reviewed the medical record, interviewed the patient and family, and examined the patient. The following aspects are pertinent.  Past Medical History:  Diagnosis Date  . Anemia   . Anxiety   . Chronic constipation   . COPD (chronic obstructive pulmonary disease) ([place])   . Degenerative arthritis of spine   . Diverticulosis of colon   . Fibromyalgia   . GERD (gastroesophageal reflux disease)   . Gross hematuria   . Hiatal hernia   . History of chronic bronchitis   . History of colon polyps   . Hyperlipidemia   . Hypertension   . Hypothyroidism   . IBS (irritable bowel syndrome)   . OA (osteoarthritis)    hips  . Renal mass, right    Social History   Social History  . Marital status: Divorced    Spouse name: N/A  . Number of children: N/A  . Years of education: N/A   Social History Main Topics  . Smoking status: Former Smoker    Packs/day: 0.50    Years: 46.00    Types: Cigarettes    Quit date: 03/05/2004  . Smokeless tobacco: Never Used  . Alcohol use No  . Drug use: No  . Sexual activity: Not Asked   Other Topics Concern  . None   Social History Narrative  . None   Family History  Problem Relation Age of Onset  . Diabetes Mother   . Heart disease Mother   . Diabetes Father   . Heart disease Father   . Diabetes Sister   . Diabetes Brother        x3  . Colon cancer Neg Hx   . Esophageal cancer Neg  Hx   . Stomach cancer Neg Hx   . Pancreatic cancer Neg Hx    Scheduled Meds: . atorvastatin  40 mg Oral QHS  . budesonide (PULMICORT) nebulizer solution  0.5 mg Nebulization BID  . chlorhexidine gluconate (MEDLINE KIT)  15 mL Mouth Rinse BID  . cycloSPORINE  1 drop Both Eyes BID  . heparin subcutaneous  5,000 Units Subcutaneous Q8H  . hydrocortisone sod succinate (SOLU-CORTEF) inj  100 mg Intravenous Q8H  . ipratropium-albuterol  3 mL Nebulization TID  . lidocaine  1 patch Transdermal Daily  . mouth rinse  15 mL Mouth Rinse QID  . midodrine  10 mg Oral TID  . sodium chloride flush  10-40 mL Intracatheter Q12H   Continuous Infusions: . meropenem (MERREM) IV 500 mg (11/24/16 0819)   PRN Meds:.acetaminophen, albuterol, fluticasone, Gerhardt's  butt cream, haloperidol lactate, lip balm, magic mouthwash, sodium chloride flush, sodium chloride flush Medications Prior to Admission:  Prior to Admission medications   Medication Sig Start Date End Date Taking? Authorizing Provider  ADVAIR DISKUS 250-50 MCG/DOSE AEPB Inhale 1 puff into the lungs 2 (two) times daily as needed (shortness of breath).  06/06/16  Yes [provider]  albuterol (PROVENTIL HFA;VENTOLIN HFA) 108 (90 Base) MCG/ACT inhaler Inhale 1-2 puffs into the lungs every 6 (six) hours as needed for wheezing. 05/20/16  Yes Larene Pickett, PA-C  atorvastatin (LIPITOR) 40 MG tablet Take 1 tablet (40 mg total) by mouth every evening. Patient taking differently: Take 40 mg by mouth at bedtime.  09/08/16  Yes [name], MD  bisacodyl (DULCOLAX) 5 MG EC tablet Take 5 mg by mouth at bedtime as needed for moderate constipation.    Yes [provider]  Cholecalciferol (VITAMIN D3) 400 units CAPS Take 400 Units by mouth daily.   Yes [provider]  cycloSPORINE (RESTASIS) 0.05 % ophthalmic emulsion Place 1 drop into both eyes 2 (two) times daily. 09/19/16  Yes [name], MD  DULoxetine (CYMBALTA) 30 MG capsule Take 1  capsule (30 mg total) by mouth 2 (two) times daily. Take two capsules po in the morning and one capsule po in the afternoon. 10/16/16  Yes Domenic Polite, MD  fluconazole (DIFLUCAN) 100 MG tablet Take 100 mg by mouth daily.   Yes [provider]  fluticasone (FLONASE) 50 MCG/ACT nasal spray Place 2 sprays into both nostrils at bedtime as needed for allergies or rhinitis. 09/19/16  Yes [name], MD  HYDROcodone-acetaminophen (NORCO/VICODIN) 5-325 MG tablet Take 1 tablet by mouth every 6 (six) hours as needed for moderate pain. 10/16/16  Yes Domenic Polite, MD  levothyroxine (SYNTHROID, LEVOTHROID) 75 MCG tablet Take 1 tablet (75 mcg total) by mouth daily before breakfast. 09/08/16  Yes [name], MD  metoprolol succinate (TOPROL-XL) 100 MG 24 hr tablet TAKE 1 TABLET BY MOUTH  DAILY WITH OR IMMEDIATLEY  FOLLOWING A MEAL 10/28/16  Yes [name], MD  NEXIUM 40 MG capsule Take 1 capsule (40 mg total) by mouth daily. 10/08/16  Yes Sagardia, Ines Bloomer, MD  nitrofurantoin (MACRODANTIN) 50 MG capsule Take 50 mg by mouth at bedtime.   Yes [provider]  oxycodone (OXY-IR) 5 MG capsule Take 5 mg by mouth every 4 (four) hours as needed for pain.   Yes [provider]  polyethylene glycol (MIRALAX / GLYCOLAX) packet Take 17 g by mouth daily as needed for mild constipation. 10/16/16  Yes Domenic Polite, MD  pregabalin (LYRICA) 150 MG capsule Take 1 capsule (150 mg total) by mouth 2 (two) times daily. 09/08/16  Yes [name], MD  Tetrahydroz-Dextran-PEG-Povid [facility] ADVANCED RELIEF) 0.05-0.1-1-1 % SOLN Apply 1 drop to eye every 4 (four) hours.   Yes [provider]  enoxaparin (LOVENOX) 40 MG/0.4ML injection Inject 0.4 mLs (40 mg total) into the skin daily. For 30 days post op for DVT prophylaxis 11/19/2016 12/06/16  Prudencio Burly III, PA-C  nitrofurantoin, macrocrystal-monohydrate, (MACROBID) 100 MG capsule Take 1 capsule (100 mg total) by mouth 2 (two) times daily. For  2 weeks, until Kidney surgery Patient not taking: Reported on 10/23/2016 10/16/16   Domenic Polite, MD  senna-docusate (SENOKOT-S) 8.6-50 MG tablet Take 1 tablet by mouth 2 (two) times daily. Patient not taking: Reported on 10/25/2016 10/16/16   Domenic Polite, MD   No Known  Allergies Review of Systems  Unable to perform ROS: Acuity of condition    Physical Exam  Constitutional: She appears well-developed and well-nourished.  Acutely ill appearing female on bipap. Appears to be transitioning towards EOL  Cardiovascular:  tachy  Pulmonary/Chest:  On bipap and not able to come off  Musculoskeletal:  No spontaneous movement  Neurological:  Unresponsive even to noxious stimuli  Skin:  Cold, mottling  Psychiatric:  Unable to test  Nursing note and vitals reviewed.   Vital Signs: BP (!) 91/57   Pulse (!) 108   Temp (!) 97.3 F (36.3 C) (Axillary)   Resp 19   Ht 5' 2"  (1.575 m)   Wt 89.6 kg (197 lb 8.5 oz)   SpO2 92%   BMI 36.13 kg/m  Pain Assessment: CPOT POSS *See Group Information*: S-Acceptable,Sleep, easy to arouse Pain Score: 0-No pain   SpO2: SpO2: 92 % O2 Device:SpO2: 92 % O2 Flow Rate: .O2 Flow Rate (L/min): 3 L/min  IO: Intake/output summary:  Intake/Output Summary (Last 24 hours) at 11/24/16 3338 Last data filed at 11/24/16 0400  Gross per 24 hour  Intake              670 ml  Output              175 ml  Net              495 ml    LBM: Last BM Date: 11/21/16 Baseline Weight: Weight: 65.3 kg (144 lb) Most recent weight: Weight: 89.6 kg (197 lb 8.5 oz)     Palliative Assessment/Data:   Flowsheet Rows     Most Recent Value  Intake Tab  Referral Department  Hospitalist  Unit at Time of Referral  Intermediate Care Unit  Palliative Care Primary Diagnosis  Sepsis/Infectious Disease  Date Notified  11/22/16  Palliative Care Type  New Palliative care  Reason for referral  Clarify Goals of Care, Psychosocial or Spiritual support  Date of Admission   10/20/2016  Date first seen by Palliative Care  11/24/16  # of days Palliative referral response time  2 Day(s)  # of days IP prior to Palliative referral  24  Clinical Assessment  Palliative Performance Scale Score  20%  Pain Max last 24 hours  Not able to report  Pain Min Last 24 hours  Not able to report  Dyspnea Max Last 24 Hours  Not able to report  Dyspnea Min Last 24 hours  Not able to report  Nausea Max Last 24 Hours  Not able to report  Nausea Min Last 24 Hours  Not able to report  Anxiety Max Last 24 Hours  Not able to report  Anxiety Min Last 24 Hours  Not able to report  Other Max Last 24 Hours  Not able to report  Psychosocial & Spiritual Assessment  Palliative Care Outcomes  Palliative Care Outcomes  Counseled regarding hospice  Patient/Family wishes: Interventions discontinued/not started   Trach, Mechanical Ventilation  Palliative Care follow-up planned  Yes, Facility      Time In: 0845 Time Out: 0955 Time Total: 70 min Greater than 50%  of this time was spent counseling and coordinating care related to the above assessment and plan. Staffed with Dr. Sherral Hammers  Signed by: Dory Horn, NP   Please contact Palliative Medicine Team phone at [phone] for questions and concerns.  For individual provider: See Shea Evans

## 2016-11-24 NOTE — Progress Notes (Signed)
CRITICAL VALUE ALERT  Critical Value:  Lactic Acid 3.4  Date & Time Notied:  11/24/2016  Provider Notified: Dr. Dia Crawford, MD  Orders Received/Actions taken: No new orders

## 2016-11-25 LAB — URINE CULTURE: Culture: NO GROWTH

## 2016-11-25 MED ORDER — HYDROMORPHONE HCL 1 MG/ML IJ SOLN
0.5000 mg | Freq: Four times a day (QID) | INTRAMUSCULAR | Status: DC
  Administered 2016-11-25 – 2016-11-26 (×4): 0.5 mg via INTRAVENOUS
  Filled 2016-11-25: qty 1
  Filled 2016-11-25: qty 0.5
  Filled 2016-11-25 (×2): qty 1

## 2016-11-25 MED ORDER — LORAZEPAM 2 MG/ML IJ SOLN
0.5000 mg | Freq: Four times a day (QID) | INTRAMUSCULAR | Status: DC
  Administered 2016-11-25 – 2016-11-26 (×4): 0.5 mg via INTRAVENOUS
  Filled 2016-11-25 (×4): qty 1

## 2016-11-25 MED ORDER — GLYCOPYRROLATE 0.2 MG/ML IJ SOLN
0.2000 mg | Freq: Four times a day (QID) | INTRAMUSCULAR | Status: DC
  Administered 2016-11-25 – 2016-11-26 (×3): 0.2 mg via INTRAVENOUS
  Filled 2016-11-25 (×3): qty 1

## 2016-11-25 NOTE — Progress Notes (Signed)
PROGRESS NOTE    JENEEN DOUTT  ION:[id number] DOB: [birth year] DOA: 10/08/2016 PCP: [name], MD   Brief Narrative:  76 y.o. 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  Patient tells me that yesterday she was assisted to and from the bedside commode, and fell to the ground.  She denies any syncope or passing out episode.  She has been complaining of persistent left knee pain and decided to come to the emergency room today.  She has a history of left knee replacement on about 10 years ago outside [place].  She was recently hospitalized and discharged about 13 days ago, and at that time she was diagnosed with an ESBL UTI and on discharge she had a PICC line and was on meropenem for a few more days.  Meropenem appears to have been finished and she is now on nitrofurantoin.  She still has the PICC line in place.  Patient denies any chest pain, denies any palpitations, she has no abdominal pain, denies any nausea or vomiting.  She denies any dysuria.  She denies cough / sputum production / congestion.  She appears a bit sleepy in the ED. patient also states that she has been eating and drinking much because of the pain.   ED Course: In the emergency room she is afebrile, her blood pressure was initially in the 24M systolic however improved into the 100s with fluids, she is satting well on 2 L nasal cannula, her blood work reveals acute kidney injury with a creatinine of 2.1 from prior normal values, and a leukocytosis of 19.  Left knee x-ray showed an acute displaced distal femur fracture on the superior aspect of femur hardware without dislocation.  ED physician discussed with orthopedic surgery and recommended surgical repair.  Will  transfer patient to [facility] for OR time over there.    Subjective: 9/22 unresponsive to stimuli.    Assessment & Plan:   Principal Problem:   Acute respiratory distress Active Problems:   Hypertension   Physical deconditioning   Hypothyroidism   Chronic obstructive pulmonary disease (HCC)   Type 2 diabetes mellitus with complication, without long-term current use of insulin (HCC)   Urothelial carcinoma of kidney, right (HCC)   Gastroesophageal reflux disease   History of recent fall   Right renal mass   Fracture of knee prosthesis (HCC)   AKI (acute kidney injury) ([place])   ESBL (extended spectrum beta-lactamase) producing bacteria infection   Closed comminuted supracondylar fracture of left femur (HCC)   Renal failure   Fall   Sepsis due to Escherichia coli (E. coli) (HCC)   Gram-negative bacteremia   Sepsis secondary to UTI ([place])   Transitional cell carcinoma (HCC)   Hypervolemia   HCAP (healthcare-associated pneumonia)   Leukocytosis   Encephalopathy acute   Hydronephrosis of right kidney   Abnormal CT scan, pelvis: 11/09/2016   Closed fracture of left distal femur (HCC)   Pleural effusion on right   Hypoalbuminemia   Acute renal failure (ARF) (HCC)   Acute respiratory failure with hypoxia ([place])   Palliative care by specialist   Acute respiratory failure with hypoxia -Continues on BiPAP. Attempt to wean, SPO2 goal> 93% -9/20 PCXR: Consistent overload, secondary  to CHF. Possible pneumonia. Atelectasis.  -Comfort Care.  Bilateral pleural effusions -Comfort Care.  Lactic acidosis/Leukocytosis -Comfort Care.  Septic shock/positive ESBL bacteremia & UTI -Comfort Care.   Elevated Ammonia -Comfort Care.  Obstructive uropathy -Comfort Care.  Chronic Diastolic CHF  -Comfort Care.  Hypotension -Comfort Care.  Acute Encephalopathy  -Comfort Care.  Metastatic RIGHT kidney Transitional Cell Carcinoma -[facility]fort Care.  Acute renal  failure/Anuric -Comfort Care.   Anemia -Comfort Care.  Thrombocytopenia -Comfort Care.   LEFT Hip fracture  - -Comfort Care.g   Left foot Thromboemboli -Comfort Care.  Moderate to severe protein calorie malnutrition -Comfort Care.   Goals of care -Comfort Care.     DVT prophylaxis: Subcutaneous heparin Code Status: DO NOT RESUSCITATE Family Communication: See goals of care Disposition Plan: Expected hospital death   Consultants:  Orthopedics Urology [facility] M ID Nephrology   Procedures/Significant Events:  08/27 - Admit 9/4 LEFT RETROGRADE FEMORAL NAILING 09/08 - Right perc nephrostomy tube placement 9/10 PCXR:   Worsening bilateral lung opacification compared with earlier chest x-ray imaging. 09/10 - Transferred to ICU with worsening respiratory status 9/12 Echocardiogram: LVEF=  65-70%, -Grade 1 diastolic dysfunction, trivial MR, no vegetation  09/12 - Transitioned back to BiPAP overnight from [place] 5 L/m 9/13 Renal US: >> large complex mass associated with RIGHT kidney with a double-J catheter in place, moderate hydronephrosis, normal left kidney 9/17 Renal US  >> 9/19 stopped lasix gtt has no response 9/20 Transfuse 1 unit PRBC 9/21 PCXR:-Calcified granuloma lateral RML.-Bilateral pleural effusions/atelectasis/consolidation. -Mildly enlarged heart.       I have personally reviewed and interpreted all radiology studies and my findings are as above.  VENTILATOR SETTINGS: BiPAP   Cultures 8/12 blood negative  8/27 blood 1/2 positive ESBL Escherichia coli 8/27 urine positive ESBL Escherichia coli   8/27 MRSA by PCR negative  8/29 blood negative 9/5 urine negative 9/7 blood negative 9/8 MRSA by PCR negative 9/8 urine negative  9/8 urine (right kidney nephrostomy tube) positive Escherichia coli ESBL   9/17 RPR negative   9/21 blood pending 9/21 urine pending    Antimicrobials: Anti-infectives    Start     Dose/Rate Stop   11/19/16 1700   meropenem (MERREM) 500 mg in sodium chloride 0.9 % 50 mL IVPB     500 mg 100 mL/hr over 30 Minutes 11/21/16 1752   11/07/16 0900  ceFAZolin (ANCEF) IVPB 2g/100 mL premix  Status:  Discontinued     2 g 200 mL/hr over 30 Minutes 11/07/16 0934   11/02/16 0700  ceFAZolin (ANCEF) IVPB 2g/100 mL premix     2 g 200 mL/hr over 30 Minutes 11/03/16 0700   11/02/16 0700  gentamicin (GARAMYCIN) 280 mg in dextrose 5 % 100 mL IVPB  Status:  Discontinued     5 mg/kg  56.2 kg (Adjusted) 107 mL/hr over 60 Minutes 11/02/16 1006   10/30/16 1000  fluconazole (DIFLUCAN) tablet 100 mg  Status:  Discontinued     100 mg 11/12/16 0926   10/30/16 0600  ceFAZolin (ANCEF) IVPB 2g/100 mL premix  Status:  Discontinued     2 g 200 mL/hr over 30 Minutes 10/30/16 1212   10/30/16 0500  meropenem (MERREM) 1 g in sodium chloride 0.9 % 100 mL IVPB  Status:  Discontinued     1 g 200 mL/hr over 30 Minutes 11/19/16 0926   10/04/2016 1630  meropenem (MERREM) 1 g in sodium chloride 0.9 % 100 mL IVPB     1 g  200 mL/hr over 30 Minutes 10/28/2016 1919   10/16/2016 1545  vancomycin (VANCOCIN) IVPB 1000 mg/200 mL premix     1,000 mg 200 mL/hr over 60 Minutes 10/26/2016 1727   11/02/2016 1545  piperacillin-tazobactam (ZOSYN) IVPB 3.375 g  Status:  Discontinued     3.375 g 100 mL/hr over 30 Minutes 11/02/2016 1546       Devices    LINES / TUBES:      Continuous Infusions:    Objective: Vitals:   11/24/16 2100 11/25/16 0000 11/25/16 0400 11/25/16 1417  BP: 91/63 (!) 73/64 (!) 90/53 (!) 75/42  Pulse: 98 (!) 102 (!) 103 (!) 108  Resp: 15 15 16    Temp:    98.6 F (37 C)  TempSrc:    Oral  SpO2: 92% 91% 91% (!) 78%  Weight:      Height:        Intake/Output Summary (Last 24 hours) at 11/25/16 1902 Last data filed at 11/25/16 0600  Gross per 24 hour  Intake                0 ml  Output                0 ml  Net                0 ml   Filed Weights   11/22/16 0330 11/23/16 0400 11/24/16 0356  Weight: 198 lb 10.2 oz  (90.1 kg) 202 lb 6.1 oz (91.8 kg) 197 lb 8.5 oz (89.6 kg)    Examination:  General: Obtunded, positive acute respiratory distress   Lungs: decreased breath sounds bilateral, negative rhonchi, negative wheezing, negative crackles  Cardiovascular: Tachycardic, regular rhythm without murmurs rubs or gallops, normal S1/S2,, Abdomen: Obese, negative abdominal pain, nondistended, positive soft, bowel sounds, no rebound, no ascites, no appreciable mass, or Anasarca Extremities: positive left foot cyanosis, clubbing, left lower foot cold to touch, sluggish capillary refill, DP/PT pulse +1 bilateral   Psychiatric:  Unable to assess secondary to altered mental status  Central nervous system:  Unable to assess secondary to altered mental status  .     Data Reviewed: Care during the described time interval was provided by me .  I have reviewed this patient's available data, including medical history, events of note, physical examination, and all test results as part of my evaluation.   CBC:  Recent Labs Lab 11/20/16 1158 11/21/16 0840 11/22/16 1530 11/23/16 0650 11/24/16 0445  WBC 19.2* 18.1* 19.8* 24.0* 21.7*  NEUTROABS 17.4*  --  17.6*  --   --   HGB 8.1* 7.7* 7.1* 9.7* 9.6*  HCT 27.3* 25.7* 23.2* 31.8* 31.7*  MCV 88.3 88.9 88.9 90.9 89.3  PLT 178 142* 128* 144* 099*   Basic Metabolic Panel:  Recent Labs Lab 11/20/16 0259 11/21/16 0305 11/22/16 0500 11/23/16 0650 11/24/16 0444 11/24/16 0445  NA 140 140 141 141 139  --   K 4.6 5.1 5.0 4.7 5.0  --   CL 110 110 108 111 108  --   CO2 19* 18* 20* 19* 18*  --   GLUCOSE 115* 168* 193* 166* 122*  --   BUN 51* 55* 65* 72* 78*  --   CREATININE 2.89* 3.27* 3.39* 3.62* 3.87*  --   CALCIUM 8.5* 8.6* 8.7* 8.7* 8.7*  --   MG 2.6* 2.6* 2.5* 2.6*  --  2.6*  PHOS 4.9* 5.4* 4.8* 4.7* 5.2*  --    GFR: Estimated Creatinine Clearance: 12.7  mL/min (A) (by C-G formula based on SCr of 3.87 mg/dL (H)). Liver Function Tests:  Recent Labs Lab  11/20/16 0259 11/21/16 0305 11/22/16 0500 11/23/16 0650 11/24/16 0444  ALBUMIN 2.4* 2.5* 2.8* 3.2* 2.8*   No results for input(s): LIPASE, AMYLASE in the last 168 hours.  Recent Labs Lab 11/19/16 1030 11/22/16 0500 11/24/16 0506  AMMONIA 23 40* 82*   Coagulation Profile: No results for input(s): INR, PROTIME in the last 168 hours. Cardiac Enzymes: No results for input(s): CKTOTAL, CKMB, CKMBINDEX, TROPONINI in the last 168 hours. BNP (last 3 results) No results for input(s): PROBNP in the last 8760 hours. HbA1C: No results for input(s): HGBA1C in the last 72 hours. CBG:  Recent Labs Lab 11/23/16 1153 11/23/16 1626 11/23/16 1921 11/23/16 2331 11/24/16 0353  GLUCAP 180* 141* 151* 133* 128*   Lipid Profile: No results for input(s): CHOL, HDL, LDLCALC, TRIG, CHOLHDL, LDLDIRECT in the last 72 hours. Thyroid Function Tests: No results for input(s): TSH, T4TOTAL, FREET4, T3FREE, THYROIDAB in the last 72 hours. Anemia Panel: No results for input(s): VITAMINB12, FOLATE, FERRITIN, TIBC, IRON, RETICCTPCT in the last 72 hours. Urine analysis:    Component Value Date/Time   COLORURINE YELLOW 11/10/2016 0941   APPEARANCEUR CLOUDY (A) 11/10/2016 0941   LABSPEC 1.008 11/10/2016 0941   PHURINE 5.0 11/10/2016 0941   GLUCOSEU NEGATIVE 11/10/2016 0941   HGBUR LARGE (A) 11/10/2016 0941   BILIRUBINUR NEGATIVE 11/10/2016 0941   BILIRUBINUR small (A) 07/26/2016 1432   KETONESUR NEGATIVE 11/10/2016 0941   PROTEINUR 30 (A) 11/10/2016 0941   UROBILINOGEN 0.2 07/26/2016 1432   NITRITE NEGATIVE 11/10/2016 0941   LEUKOCYTESUR LARGE (A) 11/10/2016 0941   Sepsis Labs: @LABRCNTIP (procalcitonin:4,lacticidven:4)  ) Recent Results (from the past 240 hour(s))  Culture, blood (routine x 2)     Status: Abnormal (Preliminary result)   Collection Time: 11/23/16  8:21 PM  Result Value Ref Range Status   Specimen Description BLOOD RIGHT WRIST  Final   Special Requests IN PEDIATRIC BOTTLE  Blood Culture adequate volume  Final   Culture  Setup Time   Final    GRAM POSITIVE COCCI IN CLUSTERS IN PEDIATRIC BOTTLE CRITICAL RESULT CALLED TO, READ BACK BY AND VERIFIED WITH: A. MASTERS,PHARMD 1738 11/24/2016 T. TYSOR    Culture (A)  Final    STAPHYLOCOCCUS SPECIES (COAGULASE NEGATIVE) THE SIGNIFICANCE OF ISOLATING THIS ORGANISM FROM A SINGLE SET OF BLOOD CULTURES WHEN MULTIPLE SETS ARE DRAWN IS UNCERTAIN. PLEASE NOTIFY THE MICROBIOLOGY DEPARTMENT WITHIN ONE WEEK IF SPECIATION AND SENSITIVITIES ARE REQUIRED.    Report Status PENDING  Incomplete  Blood Culture ID Panel (Reflexed)     Status: Abnormal   Collection Time: 11/23/16  8:21 PM  Result Value Ref Range Status   Enterococcus species NOT DETECTED NOT DETECTED Final   Vancomycin resistance NOT DETECTED NOT DETECTED Final   Listeria monocytogenes NOT DETECTED NOT DETECTED Final   Staphylococcus species DETECTED (A) NOT DETECTED Final    Comment: CRITICAL RESULT CALLED TO, READ BACK BY AND VERIFIED WITH: A. MASTERS,PHARMD 1738 11/24/2016 T. TYSOR    Staphylococcus aureus NOT DETECTED NOT DETECTED Final   Methicillin resistance DETECTED (A) NOT DETECTED Final    Comment: CRITICAL RESULT CALLED TO, READ BACK BY AND VERIFIED WITH: A. MASTERS,PHARMD 1738 11/24/2016 T. TYSOR    Streptococcus species NOT DETECTED NOT DETECTED Final   Streptococcus agalactiae NOT DETECTED NOT DETECTED Final   Streptococcus pneumoniae NOT DETECTED NOT DETECTED Final   Streptococcus pyogenes NOT DETECTED NOT DETECTED  Final   Acinetobacter baumannii NOT DETECTED NOT DETECTED Final   Enterobacteriaceae species NOT DETECTED NOT DETECTED Final   Enterobacter cloacae complex NOT DETECTED NOT DETECTED Final   Escherichia coli NOT DETECTED NOT DETECTED Final   Klebsiella oxytoca NOT DETECTED NOT DETECTED Final   Klebsiella pneumoniae NOT DETECTED NOT DETECTED Final   Proteus species NOT DETECTED NOT DETECTED Final   Serratia marcescens NOT DETECTED NOT  DETECTED Final   Carbapenem resistance NOT DETECTED NOT DETECTED Final   Haemophilus influenzae NOT DETECTED NOT DETECTED Final   Neisseria meningitidis NOT DETECTED NOT DETECTED Final   Pseudomonas aeruginosa NOT DETECTED NOT DETECTED Final   Candida albicans NOT DETECTED NOT DETECTED Final   Candida glabrata NOT DETECTED NOT DETECTED Final   Candida krusei NOT DETECTED NOT DETECTED Final   Candida parapsilosis NOT DETECTED NOT DETECTED Final   Candida tropicalis NOT DETECTED NOT DETECTED Final  Culture, blood (routine x 2)     Status: None (Preliminary result)   Collection Time: 11/23/16 10:20 PM  Result Value Ref Range Status   Specimen Description BLOOD RIGHT ARM  Final   Special Requests IN PEDIATRIC BOTTLE Blood Culture adequate volume  Final   Culture NO GROWTH 2 DAYS  Final   Report Status PENDING  Incomplete  Culture, Urine     Status: None   Collection Time: 11/23/16 10:30 PM  Result Value Ref Range Status   Specimen Description Urine  Final   Special Requests NONE  Final   Culture NO GROWTH  Final   Report Status 11/25/2016 FINAL  Final         Radiology Studies: No results found.      Scheduled Meds: . glycopyrrolate  0.2 mg Intravenous Q6H  .  HYDROmorphone (DILAUDID) injection  0.5 mg Intravenous Q6H  . lidocaine  1 patch Transdermal Daily  . LORazepam  0.5 mg Intravenous Q6H   Continuous Infusions:    LOS: 27 days    Time spent: 40 minutes    Harlo Fabela, Geraldo Docker, MD [facility] Pager [phone]   If 7PM-7AM, please contact night-coverage [email or web address] Password [email or web address] 11/25/2016, 7:02 PM

## 2016-11-25 NOTE — Progress Notes (Signed)
Patient's granddaughters arrived from [place] and stated they were ready to provide only comfort care for the patient. [facility] called and new orders received to stop Bi-Pap, placed patient on 2L/[place]. D/C'd oral medications, and safety-set. No orders for lab work. See new orders. Dr. Tamala Julian on-call for [facility]. Family at bedside, chaplain notified and came to see granddaughters. Will continue to monitor closely.

## 2016-11-25 NOTE — Progress Notes (Signed)
Chaplain became aware of Ms. Sumners on Saturday evening while passing through 2nd  unit on the way to another unit on the 2nd floor. The attending nurse stopped the Chaplain and said "I was just about to call you." See notes from [place] discipline for larger context about patient's relationship with family and other challenging social determinants of health. -Before injury at nursing home pt. Had been homeless.-  Patient had been disconnected from family for a number of months. Chaplain interacted with 2 of the patient's young adult grand children >25 who drove to [facility] from [facility] once they became aware of the seriousness of the Ms. Shams's condition and her  Chaplain affirmed the courage, love and determination of Ciara and Apolonio Schneiders who will be standing as the patient's family and offering the gift of presence. Ciara [phone]. These young ladies need support in being connected to accurate information about Ms. Koelling's affairs and a HCPOA process needs to be initiated. They are also without the money to secure lodging while here in [place]. Chaplain Marjory Lies will speak with Scharlene Gloss first thing Monday morning about hospital housing  and [place] will initiate a consult to the Clinical Social Work team.

## 2016-11-25 NOTE — Progress Notes (Signed)
Daily Progress Note   Patient Name: Isabel Collins       Date: 11/25/2016 DOB: 1940/10/26  Age: 76 y.o. MRN#: [id number] Attending Physician: Allie Bossier, MD Primary Care Physician: [name], MD Admit Date: 10/07/2016  Reason for Consultation/Follow-up: Establishing goals of care, Non pain symptom management, Pain control, Psychosocial/spiritual support and Terminal Care  Subjective: Patient made full comfort care last night after her granddaughters arrived from [place]. She is now off antibiotics as well as BiPAP. Mild restlessness noted as well as upper airway secretions forming. [name] at the bedside this morning  Length of Stay: 27  Current Medications: Scheduled Meds:  . glycopyrrolate  0.2 mg Intravenous Q6H  .  HYDROmorphone (DILAUDID) injection  0.5 mg Intravenous Q6H  . lidocaine  1 patch Transdermal Daily  . LORazepam  0.5 mg Intravenous Q6H    Continuous Infusions:   PRN Meds: acetaminophen **OR** acetaminophen, antiseptic oral rinse, Gerhardt's butt cream, glycopyrrolate **OR** glycopyrrolate **OR** glycopyrrolate, haloperidol **OR** haloperidol **OR** haloperidol lactate, HYDROmorphone (DILAUDID) injection, lip balm, LORazepam **OR** LORazepam **OR** LORazepam, ondansetron **OR** ondansetron (ZOFRAN) IV, polyvinyl alcohol, sodium chloride flush  Physical Exam  Constitutional: She appears well-developed and well-nourished.  Acutely ill female; transitioning towards EOL  HENT:  Head: Normocephalic and atraumatic.  Neck: Normal range of motion.  Pulmonary/Chest:  Off BIPAP; increased work of breathing at rest Upper airway secretions present  Genitourinary:  Genitourinary Comments: Foley  Neurological:  Mild restlessness noted Unresponsive;  non-verbal  Skin: There is pallor.  Cool, mottled  Psychiatric:  Unable to test  Nursing note and vitals reviewed.           Vital Signs: BP (!) 90/53 (BP Location: Left Arm)   Pulse (!) 103   Temp (!) 97.2 F (36.2 C) (Axillary)   Resp 16   Ht 5\' 2"  (1.575 m)   Wt 89.6 kg (197 lb 8.5 oz)   SpO2 91%   BMI 36.13 kg/m  SpO2: SpO2: 91 % O2 Device: O2 Device: Nasal Cannula O2 Flow Rate: O2 Flow Rate (L/min): 2 L/min  Intake/output summary:  Intake/Output Summary (Last 24 hours) at 11/25/16 0914 Last data filed at 11/25/16 0600  Gross per 24 hour  Intake              300 ml  Output               60 ml  Net              240 ml   LBM: Last BM Date: 11/24/16 Baseline Weight: Weight: 65.3 kg (144 lb) Most recent weight: Weight: 89.6 kg (197 lb 8.5 oz)       Palliative Assessment/Data:    Flowsheet Rows     Most Recent Value  Intake Tab  Referral Department  Hospitalist  Unit at Time of Referral  Intermediate Care Unit  Palliative Care Primary Diagnosis  Sepsis/Infectious Disease  Date Notified  11/22/16  Palliative Care Type  New Palliative care  Reason for referral  Clarify Goals of Care, Psychosocial or Spiritual support  Date of Admission  10/06/2016  Date first seen by Palliative Care  11/24/16  # of days Palliative referral response time  2 Day(s)  # of days IP prior to Palliative referral  24  Clinical Assessment  Palliative Performance Scale Score  20%  Pain Max last 24 hours  Not able to report  Pain Min Last 24 hours  Not able to report  Dyspnea Max Last 24 Hours  Not able to report  Dyspnea Min Last 24 hours  Not able to report  Nausea Max Last 24 Hours  Not able to report  Nausea Min Last 24 Hours  Not able to report  Anxiety Max Last 24 Hours  Not able to report  Anxiety Min Last 24 Hours  Not able to report  Other Max Last 24 Hours  Not able to report  Psychosocial & Spiritual Assessment  Palliative Care Outcomes  Palliative Care Outcomes  Counseled  regarding hospice  Patient/Family wishes: Interventions discontinued/not started   Trach, Mechanical Ventilation  Palliative Care follow-up planned  Yes, Facility      Patient Active Problem List   Diagnosis Date Noted  . Palliative care by specialist   . Acute respiratory distress 11/12/2016  . Acute renal failure (ARF) ([place])   . Acute respiratory failure with hypoxia ([place])   . Hypoalbuminemia 11/11/2016  . Hydronephrosis of right kidney 11/10/2016  . Abnormal CT scan, pelvis: 11/09/2016 11/10/2016  . Closed fracture of left distal femur ([place])   . Pleural effusion on right   . HCAP (healthcare-associated pneumonia)   . Leukocytosis   . Encephalopathy acute   . Hypervolemia   . Renal failure   . Fall   . Sepsis due to Escherichia coli (E. coli) ([place])   . Gram-negative bacteremia   . Sepsis secondary to UTI ([place])   . 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 Fracture of knee prosthesis ([place]) 10/04/2016  . AKI (acute kidney injury) ([place]) 10/18/2016  . ESBL (extended spectrum beta-lactamase) producing bacteria infection 10/22/2016  . Closed comminuted supracondylar fracture of left femur ([place]) 10/22/2016  . Chronic constipation 10/12/2016  . Acute pyelonephritis 10/11/2016  . Right renal mass 10/08/2016  . Right flank pain, chronic 10/08/2016  . Chronic pain syndrome 10/08/2016  . History of recent fall 10/05/2016  . Physical deconditioning 10/01/2016  . Hypothyroidism 10/01/2016  . Chronic obstructive pulmonary disease ([place]) 10/01/2016  . Type 2 diabetes mellitus with complication, without long-term current use of insulin ([place]) 10/01/2016  . 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 Urothelial carcinoma of kidney, right ([place]) 10/01/2016  . Gastroesophageal reflux disease 10/01/2016  . Dyspnea on exertion 06/25/2016  . Fibromyalgia 05/23/2016  . 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  Hypertension 02/10/2016  . High cholesterol 02/10/2016    . Hiatal hernia 02/10/2016    Palliative Care Assessment & Plan   Patient Profile: 76 y.o. 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 Her left knee is the site of a prior total knee replacement. Findings revealed acute left distal femur fracture; ortho consulted and recommended surgery. However in the interim she has continued to decline despite supportive therapy, antibiotics. Patient was just admitted to the [facility] system on 11/05/2016 with the urinary tract infection and was discharged to a skilled nursing facility with a PICC line to continue IV antibiotics  As noted, despite these interventions, she has continued to deteriorate, now with no urine output, increasing creatinine, now 3.87, lactic acid 4.4. She has been BiPAP dependent until 11/25/2016. After granddaughters have arrived to the bedside and they requested full comfort care and BiPAP was removed    Recommendations/Plan:  Secretions: Start scheduled Robinul 0.2 mg every 6 hours as well as as needed  Dyspnea: Start scheduled hydromorphone at 0.5 mg every 6 hours as well as every 2 hours as needed   Pain: Start scheduled hydromorphone. See above  Agitation: Start Ativan 0.5 mg every 6 hours as well as as needed  Transfer to 6 [address]. for palliative care  Social worker as well as Interior and spatial designer to continue to support granddaughter who has driven from [place] and Additional Recommendations:  Limitations on Scope of Treatment: Full Comfort Care  Code Status:    Code Status Orders        Start     Ordered   11/24/16 2046  Do not attempt resuscitation (DNR)  Continuous    Question Answer Comment  In the event of cardiac or respiratory ARREST Do not call a "code blue"   In the event of  cardiac or respiratory ARREST Do not perform Intubation, CPR, defibrillation or ACLS   In the event of cardiac or respiratory ARREST Use medication by any route, position, wound care, and other measures to relive pain and suffering. May use oxygen, suction and manual treatment of airway obstruction as needed for comfort.      11/24/16 2051    Code Status History    Date Active Date Inactive Code Status Order ID Comments User Context   11/23/2016  8:06 PM 11/24/2016  8:51 PM DNR [id number]  Allie Bossier, MD Inpatient   11/02/2016  6:41 PM 11/23/2016  8:06 PM Full Code [id number]  Caren Griffins, MD Inpatient   10/12/2016  1:00 AM 10/16/2016  6:14 PM Full Code [id number]  Reubin Milan, MD ED       Prognosis:   Hours - Days  Discharge Planning:  Anticipated Hospital Death  Thank you for allowing the Palliative Medicine Team to assist in the care of this patient.   Time In: 0845 Time Out: 0920 Total Time 35 min Prolonged Time Billed  no       Greater than 50%  of this time was spent counseling and coordinating care related to the above assessment and plan.  Dory Horn, NP  Please contact Palliative Medicine Team phone at [phone] for questions and concerns.

## 2016-11-26 LAB — CULTURE, BLOOD (ROUTINE X 2): Special Requests: ADEQUATE

## 2016-11-26 MED ORDER — SCOPOLAMINE 1 MG/3DAYS TD PT72
1.0000 | MEDICATED_PATCH | TRANSDERMAL | Status: DC
  Administered 2016-11-26: 1.5 mg via TRANSDERMAL
  Filled 2016-11-26: qty 1

## 2016-11-28 LAB — CULTURE, BLOOD (ROUTINE X 2)
CULTURE: NO GROWTH
Special Requests: ADEQUATE

## 2016-12-03 DIAGNOSIS — 419620001 Death: Secondary | SNOMED CT

## 2016-12-03 NOTE — Progress Notes (Signed)
Patient's body transferred to the morgue.

## 2016-12-03 NOTE — Progress Notes (Signed)
[name] made aware of pt declining vitals, and the need for additional secretion management throughout the night. Emotional support given, patient repositioned and given scheduled medications. Compassion cart ordered from service response.

## 2016-12-03 NOTE — Progress Notes (Signed)
Called to room by granddaughter who stated she believed her grandmother may have stopped breathing. Two RNs verified that patient has passed. [place] paged and [place] [facility] called. [place] stated she did not want the chaplin at this time, and needed to step out to call her husband. Oncoming RN made aware of changes.

## 2016-12-03 NOTE — Progress Notes (Signed)
Nutrition Brief Note  Chart reviewed. Pt now transitioning to comfort care.  No further nutrition interventions warranted at this time.  Please re-consult as needed.   Juandaniel Manfredo A. Cire Deyarmin, RD, LDN, CDE Pager: [phone] After hours Pager: [phone]  

## 2016-12-03 NOTE — Progress Notes (Signed)
CSW received call from patient granddaughter this morning informing CSW of patient's passing.    Granddaughter requesting assistance finding out if patient has life insurance policy to see if this can assist with paying for funeral services.  CSW inquired with CSW supervisor and other co-workers and no known way to find out if patient has policy since we have no information of where patient would have filed a Actuary.  CSW spoke with supervisor and CSW department will pay for cremation for patient- patient granddaughter is agreeable to this plan- CSW supervisor to inform [employer] and [employer] of patient and help coordinate with patient granddaughter  CSW informed granddaughter to call CSW back with any further needs  Jorge Ny, [place] Social Worker [phone]

## 2016-12-03 DEATH — deceased

## 2016-12-06 ENCOUNTER — Inpatient Hospital Stay: Admission: RE | Admit: 2016-12-06 | Payer: Self-pay | Source: Ambulatory Visit | Admitting: Urology

## 2016-12-07 ENCOUNTER — Inpatient Hospital Stay (HOSPITAL_COMMUNITY): Admission: RE | Admit: 2016-12-07 | Payer: Medicare Other | Source: Ambulatory Visit | Admitting: Urology

## 2016-12-07 SURGERY — NEPHROURETERECTOMY, ROBOT-ASSISTED, LAPAROSCOPIC
Anesthesia: General | Laterality: Right

## 2017-01-28 ENCOUNTER — Telehealth: Payer: Self-pay | Admitting: Emergency Medicine

## 2017-01-28 NOTE — Telephone Encounter (Signed)
Documenting lost to followup now note patient is deceased since that time

## 2017-02-02 NOTE — Discharge Summary (Signed)
Death Summary  Isabel Collins SWF:[id number] DOB: [birth year] DOA: November 10, 2016  PCP: [name], MD PCP/Office notified: No  Admit date: 10-Nov-2016 Date of Death: 2016/12/08 Final Diagnoses:  Principal Problem:   Acute respiratory distress Active Problems:   Hypertension   Physical deconditioning   Hypothyroidism   Chronic obstructive pulmonary disease ([place])   Type 2 diabetes mellitus with complication, without long-term current use of insulin (HCC)   Urothelial carcinoma of kidney, right (HCC)   Gastroesophageal reflux disease   History of recent fall   Right renal mass   Fracture of knee prosthesis (HCC)   AKI (acute kidney injury) ([place])   ESBL (extended spectrum beta-lactamase) producing bacteria infection   Closed comminuted supracondylar fracture of left femur ([place])   Renal failure   Fall   Sepsis due to Escherichia coli (E. coli) (HCC)   Gram-negative bacteremia   Sepsis secondary to UTI ([place])   Transitional cell carcinoma (HCC)   Hypervolemia   HCAP (healthcare-associated pneumonia)   Leukocytosis   Encephalopathy acute   Hydronephrosis of right kidney   Abnormal CT scan, pelvis: 11/09/2016   Closed fracture of left distal femur (HCC)   Pleural effusion on right   Hypoalbuminemia   Acute renal failure (ARF) (HCC)   Acute respiratory failure with hypoxia ([place])   Palliative care by specialist Acute respiratory failure with hypoxia -Continues on BiPAP. Attempt to wean, SPO2 goal> 93% -9/20 PCXR: Consistent overload, secondary to CHF. Possible pneumonia. Atelectasis.  -Comfort Care.   Bilateral pleural effusions -Comfort Care.   Lactic acidosis/Leukocytosis -Comfort Care.   Septic shock/positive ESBL bacteremia & UTI -Comfort Care.    Elevated Ammonia -Comfort Care.   Obstructive uropathy -Comfort Care.   Chronic Diastolic CHF  -Comfort Care.   Hypotension -Comfort Care.   Acute Encephalopathy  -Comfort Care.   Metastatic RIGHT kidney  Transitional Cell Carcinoma -Comfort Care.   Acute renal failure/Anuric -Comfort Care.   Anemia -Comfort Care.   Thrombocytopenia -Comfort Care.    LEFT Hip fracture  - -Comfort Care.g    Left foot Thromboemboli -Comfort Care.   Moderate to severe protein calorie malnutrition -Comfort Care.   Goals of care -Comfort Care.   History of present illness:  76 y.o. WF PMHx Anxiety, Fibromyalgia, HTN, COPD, DM Type 2 , HTN, HLD, RIGHT [place] Transitional Cell Carcinoma  S/P Ureteroscopy/Biopsy and Stent, supposed to have Nephrectomy 3 days from now, IBS, Colon Polyps   Presents to the emergency room from her skilled nursing home after having a fall and injuring her left knee.  Patient tells me that yesterday she was assisted to and from the bedside commode, and fell to the ground.  She denies any syncope or passing out episode.  She has been complaining of persistent left knee pain and decided to come to the emergency room today.  She has a history of left knee replacement on about 10 years ago outside [place].  She was recently hospitalized and discharged about 13 days ago, and at that time she was diagnosed with an ESBL UTI and on discharge she had a PICC line and was on meropenem for a few more days.  Meropenem appears to have been finished and she is now on nitrofurantoin.  She still has the PICC line in place.  Patient denies any chest pain, denies any palpitations, she has no abdominal pain, denies any nausea or vomiting.  She denies any dysuria.  She denies cough / sputum production / congestion.  She  appears a bit sleepy in the ED. patient also states that she has been eating and drinking much because of the pain.  During his hospitalization patient was transitioned to comfort care secondary to metastatic RIGHT kidney transitional cell carcinoma resulting in obstructive uropathy, acute respiratory failure with hypoxia secondary to bilateral pleural effusions. Per EMR patient passed  peacefully on 12-06-16 @ 0 710    Time: 0710  Signed:  Dia Crawford, MD [facility] [phone]

## 2018-01-17 IMAGING — DX DG ABDOMEN 2V
3 series · 3 of 3 positions shown · non-contrast
Comparison: 09/17/2016 CT

CLINICAL DATA: Constipation

EXAM:
ABDOMEN - 2 VIEW

[w abdomen upright]
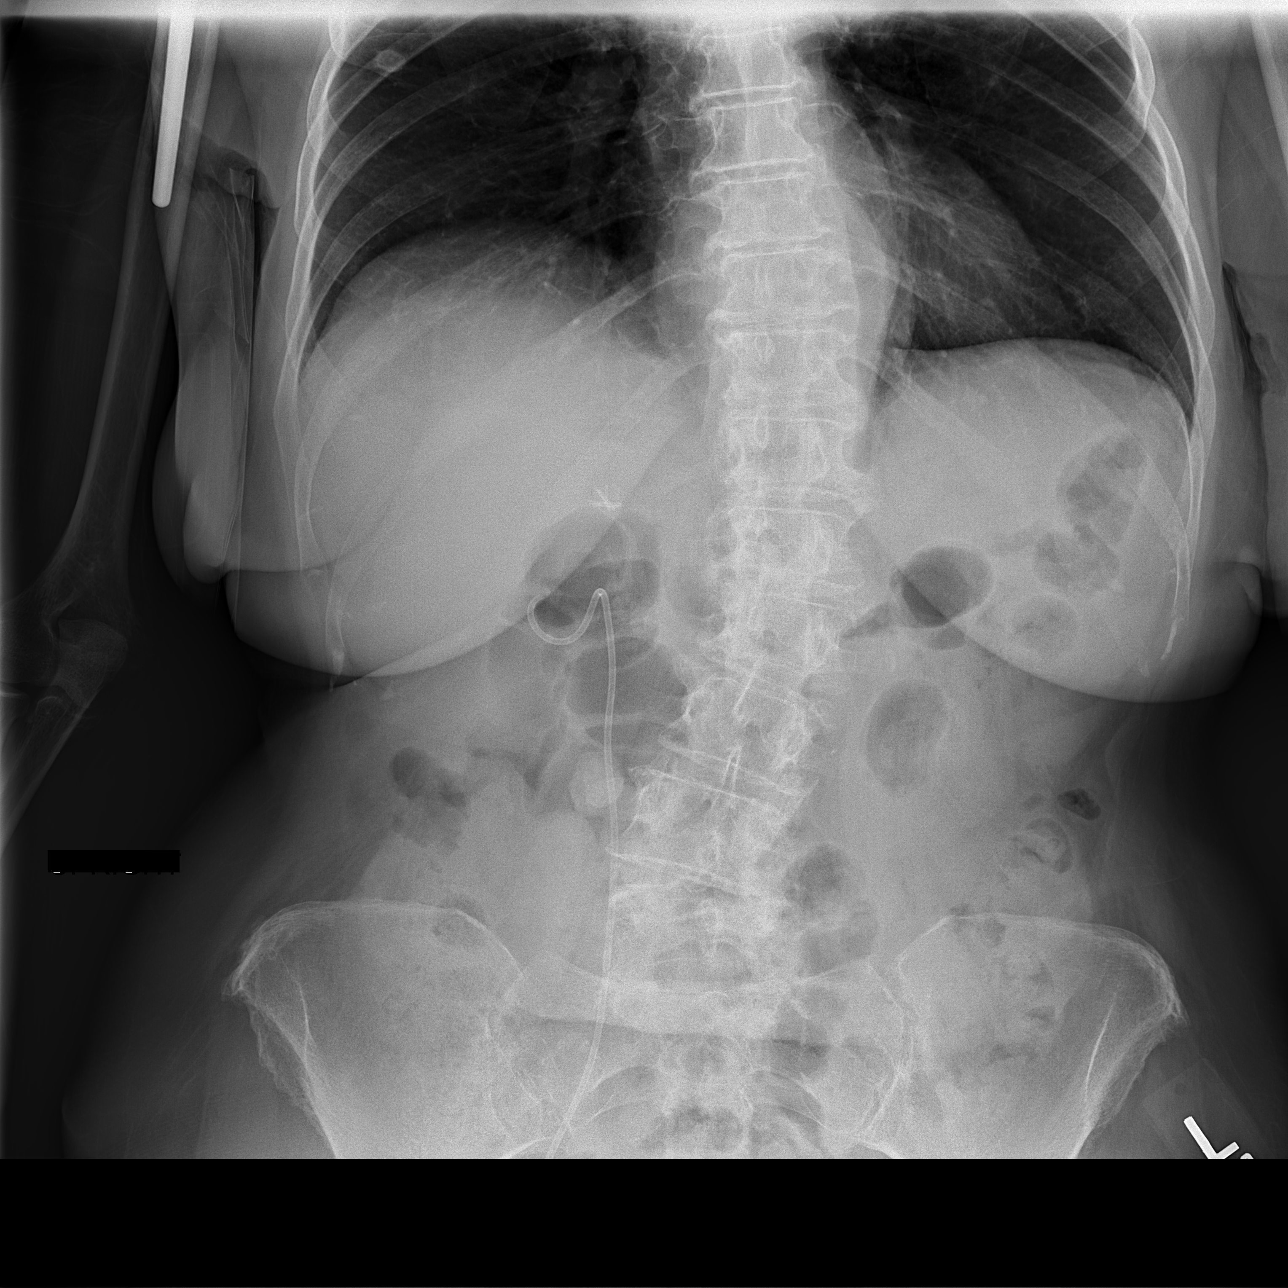

[x abdomen supine (1 of 2)]
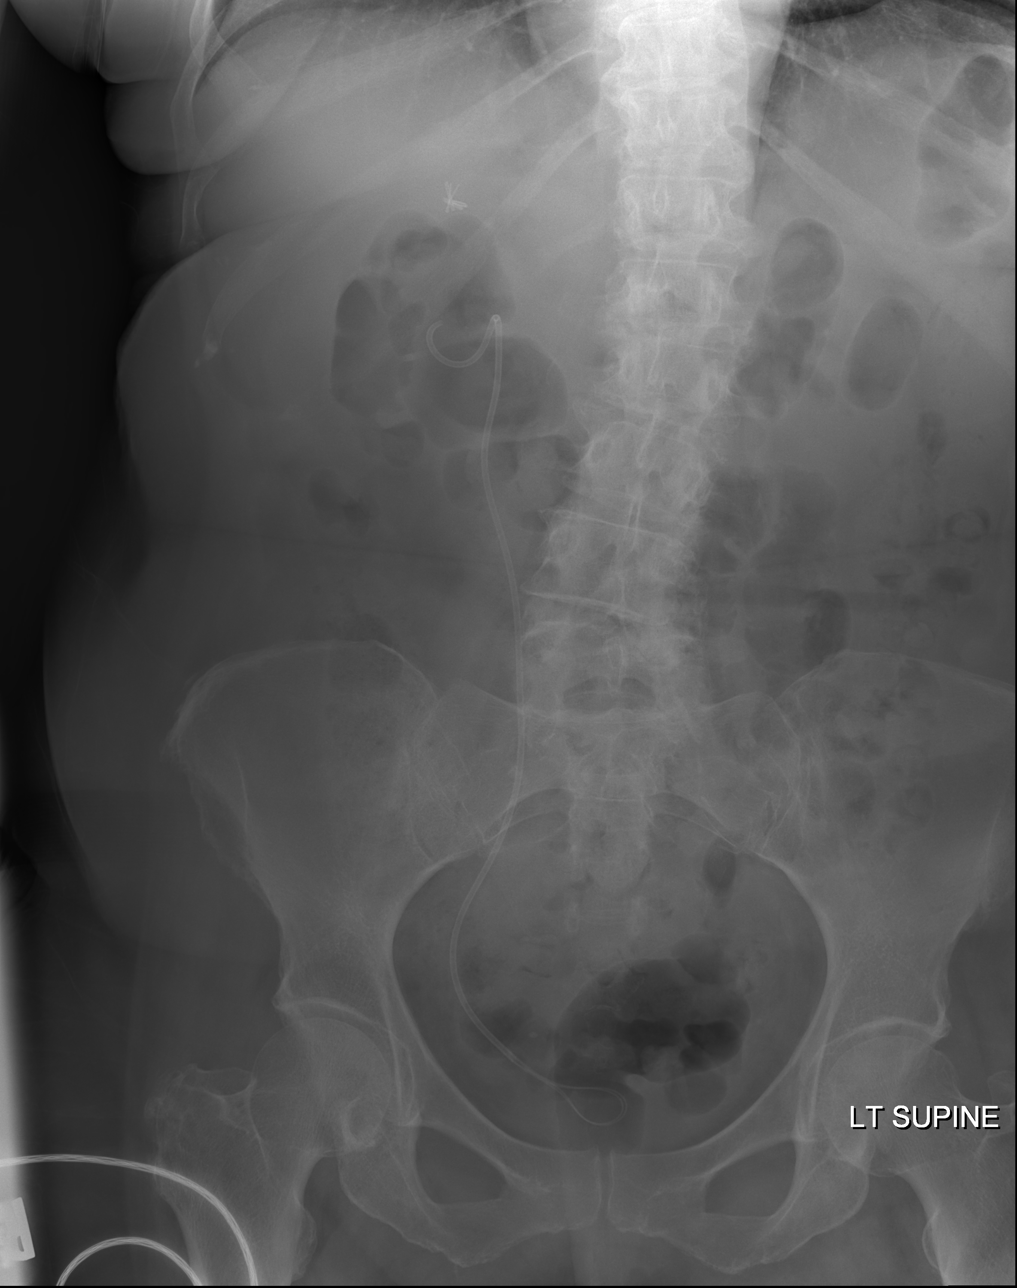

[x abdomen supine (2 of 2)]
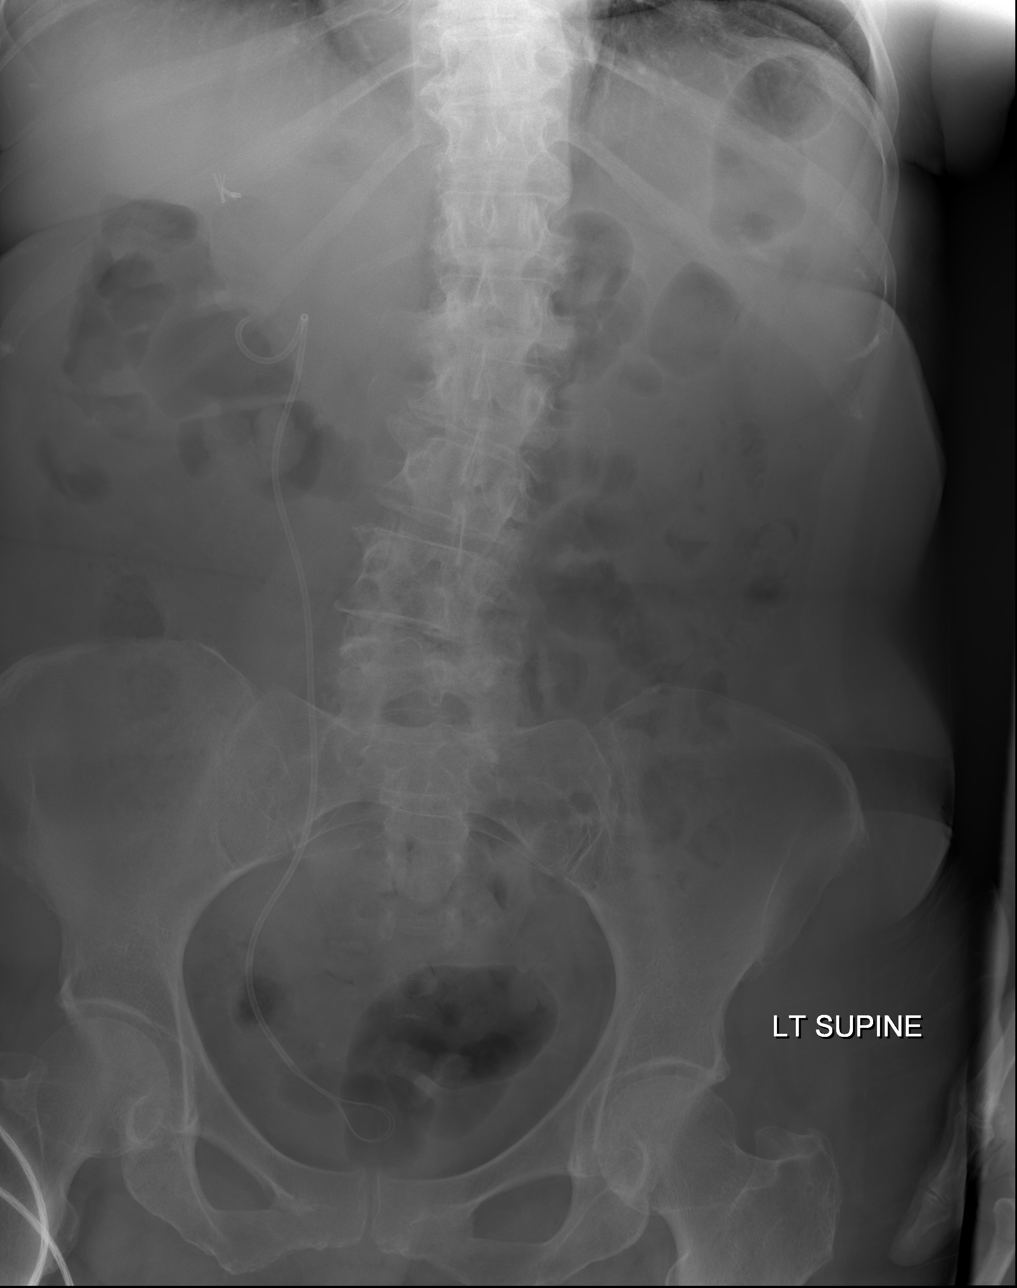

[3 of 3 positions shown; findings below may reference images not displayed]

FINDINGS: A moderate amount of fecal retention is seen within large bowel
consistent with constipation. A right-sided ureteral stent is noted,
similar in appearance and orientation to prior CT scout view of the
abdomen and pelvis. There is no evidence of free air. No
radio-opaque calculi or other significant radiographic abnormality
is seen. Cholecystectomy clips are seen in the right upper quadrant.
There is thoracolumbar spondylosis with mild levoscoliosis.
IMPRESSION: Findings consistent with constipation.

## 2018-02-27 IMAGING — DX DG CHEST 1V PORT
1 series · 1 of 1 positions shown · non-contrast
Comparison: 11/20/2016

CLINICAL DATA: Hypoxia.

EXAM:
PORTABLE CHEST 1 VIEW

[chest ap]
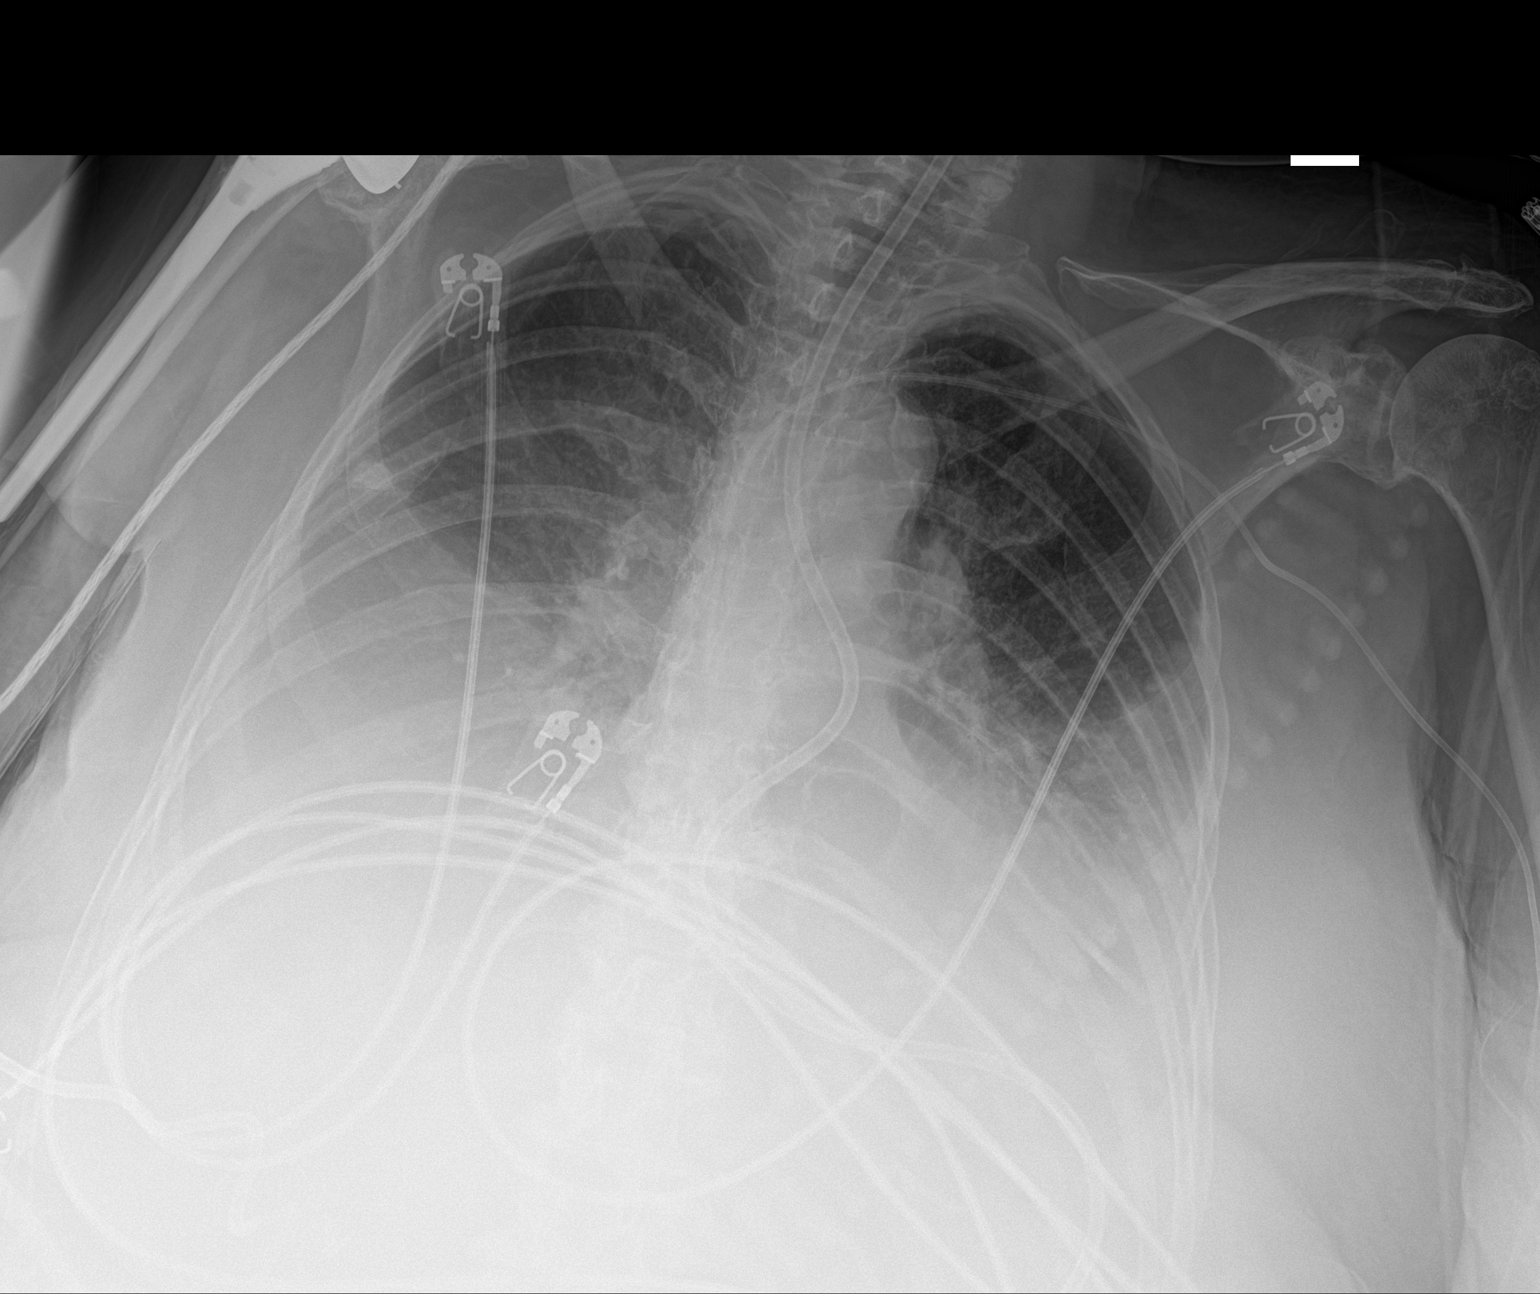

[1 of 1 positions shown; findings below may reference images not displayed]

FINDINGS: Shallow inspiration. Probable bilateral pleural effusions with
basilar atelectasis or consolidation bilaterally. Heart size is
obscured by the pleural/parenchymal process. No definite vascular
congestion. No pneumothorax. Calcified granuloma in the right mid
lung. Since the previous study, an enteric tube has been placed. The
tip is off the field of view but below the left hemidiaphragm. Left
PICC catheter remains in place with tip over the cavoatrial junction
region. Postoperative changes in the right shoulder. Degenerative
changes in the spine and left shoulder.
IMPRESSION: Appliances appear in satisfactory position. Persistent bilateral
pleural effusions and basilar atelectasis or consolidation without
change.

## 2018-04-16 IMAGING — US US RENAL
1 series · 14 of 25 positions shown · non-contrast
Comparison: 11/15/2016, [DATE]

CLINICAL DATA: Advanced right urothelial cancer with severe right
hydronephrosis, status post right nephrostomy and right ureteral
stent. Urosepsis.

EXAM:
RENAL / URINARY TRACT ULTRASOUND COMPLETE

[Series 1: us renal · 0.26mm/px · 14 of 34 slices shown]
[im 1/34]
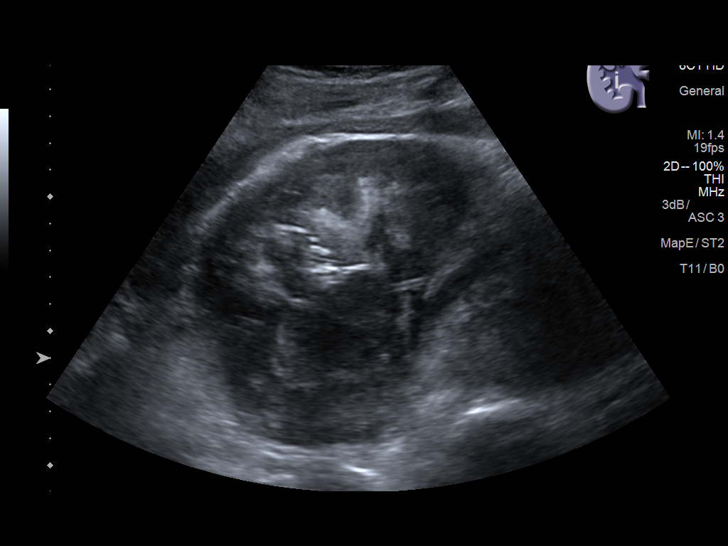
[im 3/34]
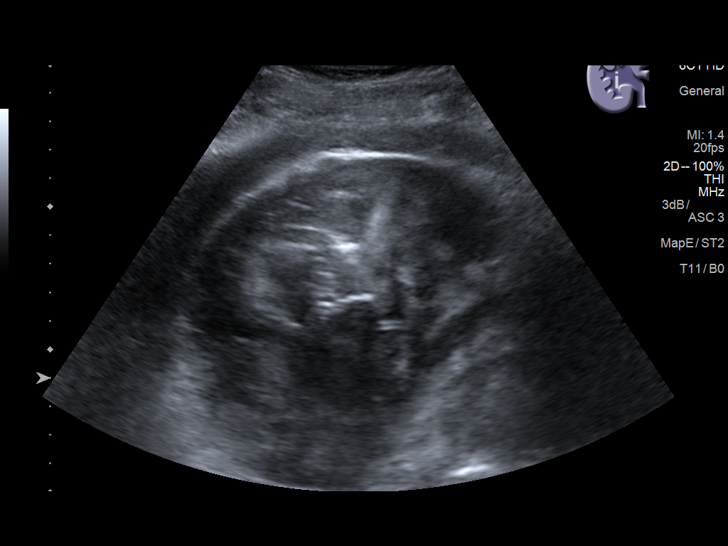
[im 6/34]
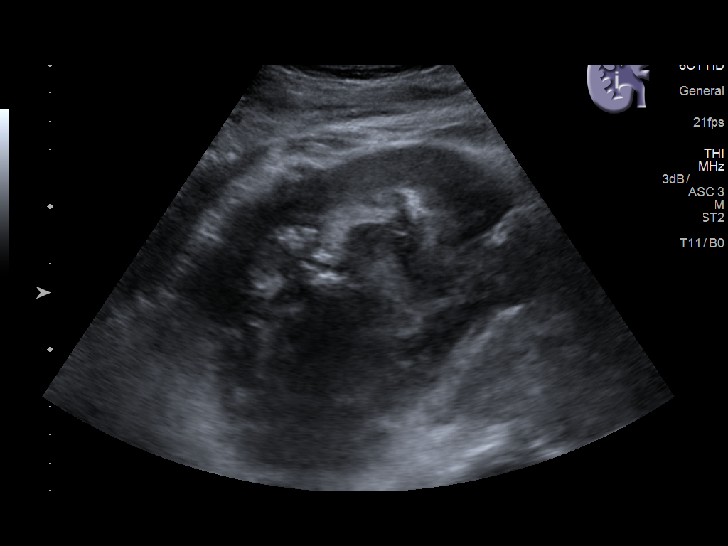
[im 9/34]
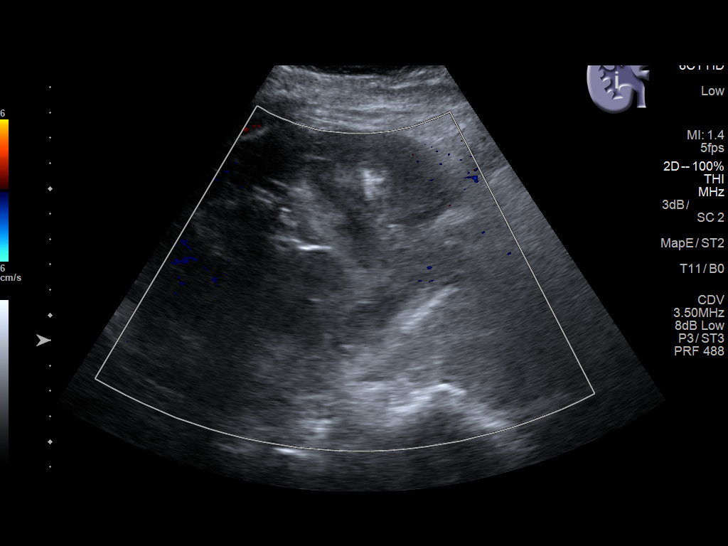
[im 12/34]
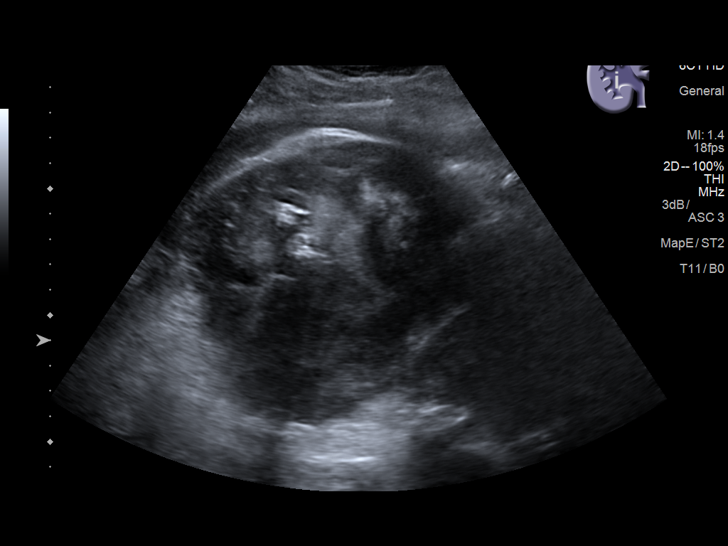
[im 13/34]
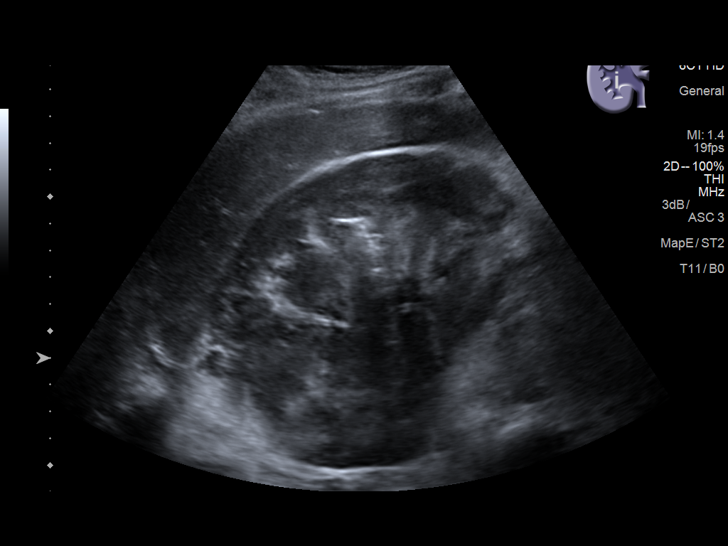
[im 16/34]
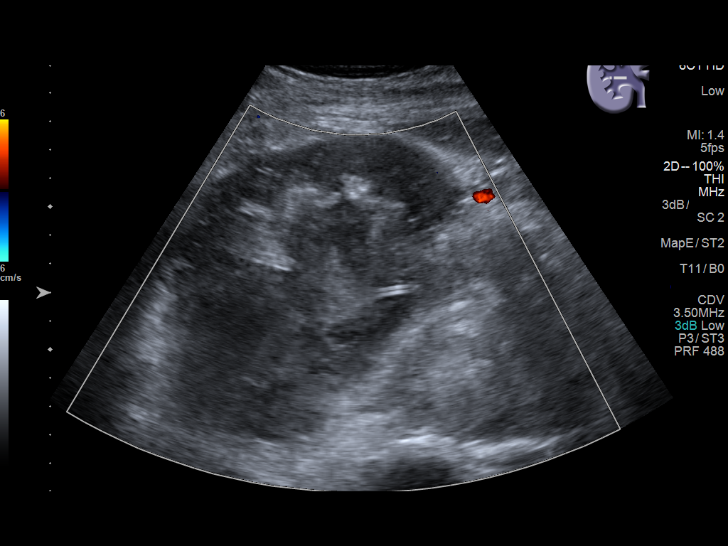
[im 18/34]
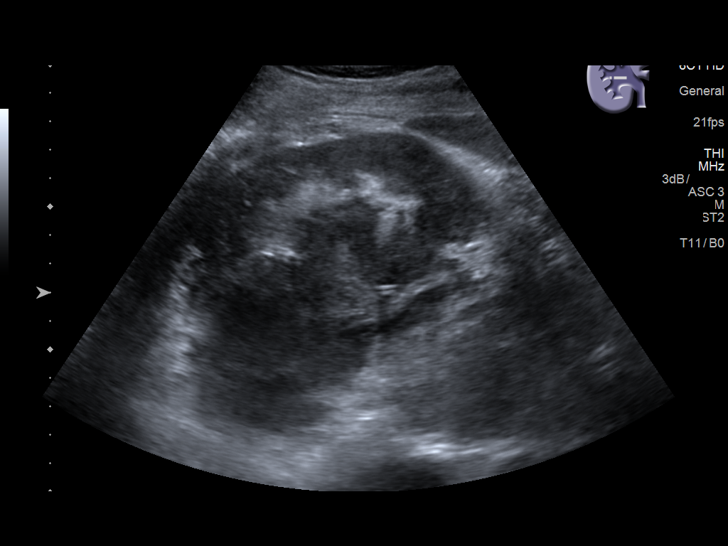
[im 21/34]
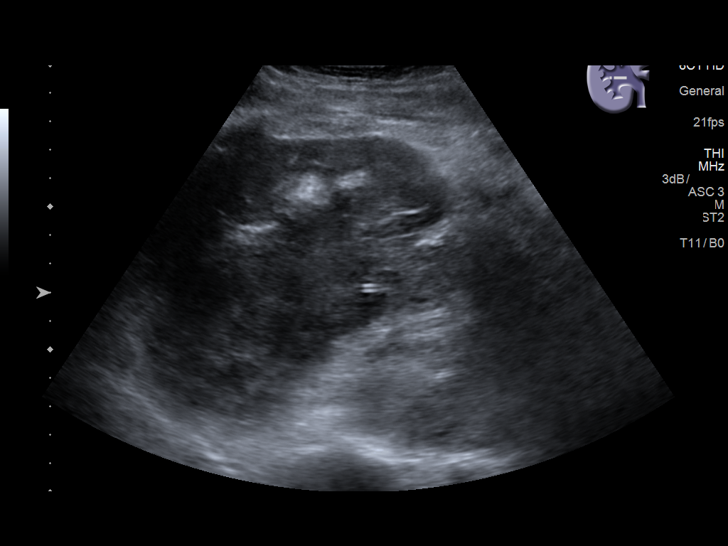
[im 23/34]
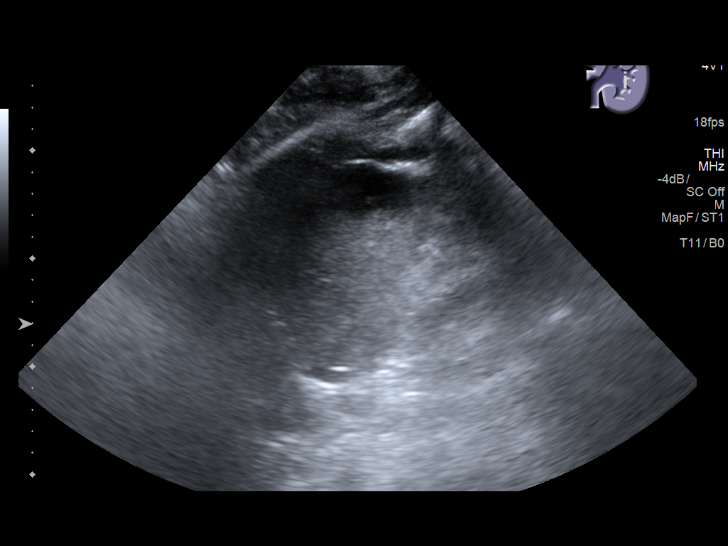
[im 25/34]
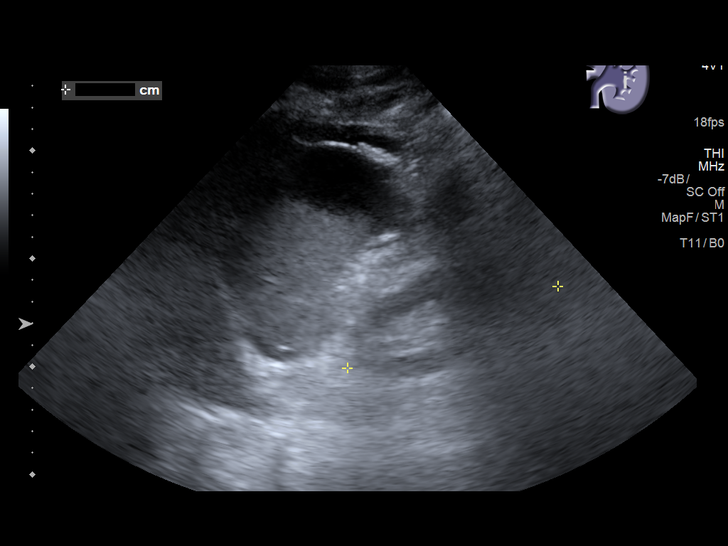
[im 28/34]
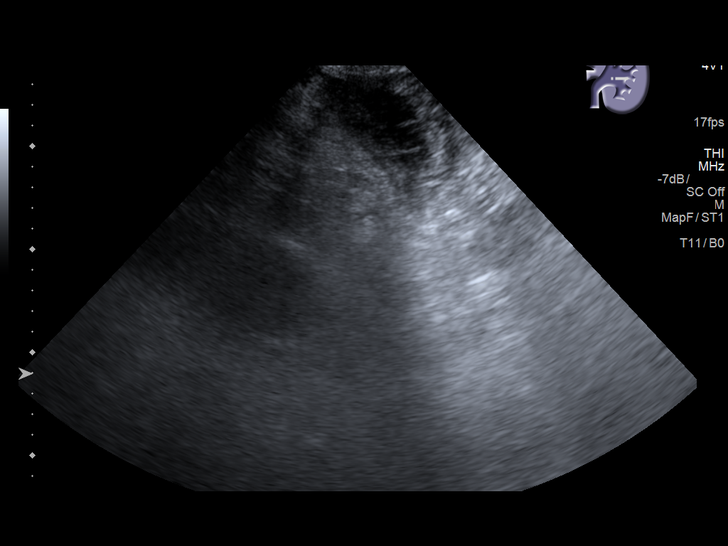
[im 31/34]
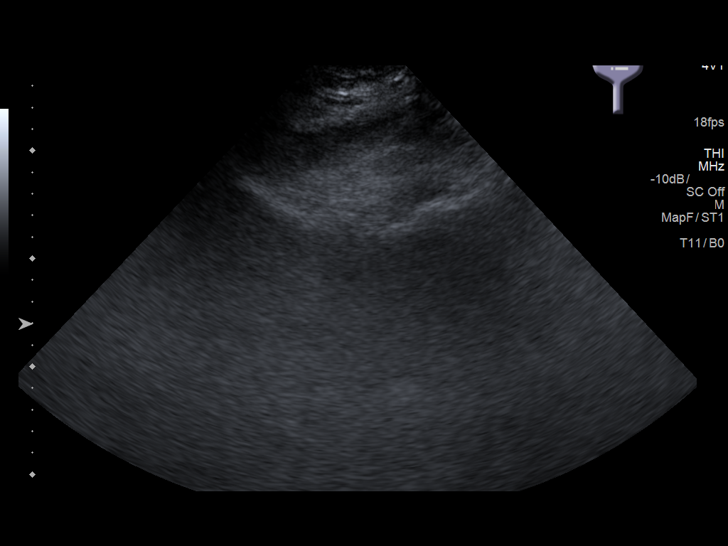
[im 34/34]
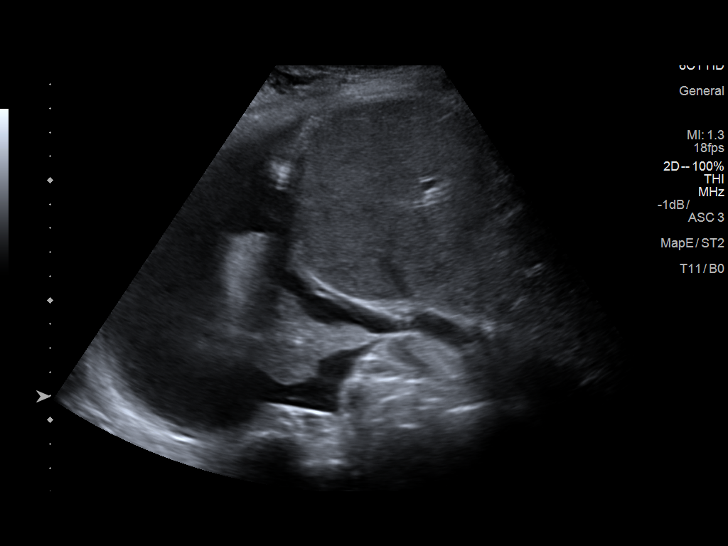

[14 of 25 positions shown; findings below may reference images not displayed]

FINDINGS: Right Kidney:

Length: 11.8 cm. Large complex ill-defined 10.9 x 6.7 x 2.1 cm mass.
Ureteral stent visualized. Similar appearing mild-to-moderate
hydronephrosis. Suspect echogenic shadowing air within the
collecting system.

Left Kidney:

Length: 10.5 cm. No hydronephrosis. Limited assessment because of
obscuring bowel gas.

Bladder:

Collapsed by Foley.  Not visualized.

Pleural effusions present bilaterally. Upper abdominal ascites also
noted.
IMPRESSION: Large complex right renal mass. Double-J stent is visualized.
Suspect similar mild to moderate residual right hydronephrosis. No
significant change compared 11/15/2016.

Limited visualization but grossly normal left kidney without
hydronephrosis.

Collapsed bladder

Bilateral pleural effusions and a small amount of upper abdominal
ascites
